# Patient Record
Sex: Female | Born: 1940 | Race: Black or African American | Hispanic: No | Smoking: Never smoker
Health system: Southern US, Community
[De-identification: ages and names within clinical notes are randomized; demographics above are authoritative.]

## PROBLEM LIST (undated history)

## (undated) DIAGNOSIS — D649 Anemia, unspecified: Secondary | ICD-10-CM

## (undated) DIAGNOSIS — F329 Major depressive disorder, single episode, unspecified: Secondary | ICD-10-CM

## (undated) DIAGNOSIS — G40909 Epilepsy, unspecified, not intractable, without status epilepticus: Secondary | ICD-10-CM

## (undated) DIAGNOSIS — F319 Bipolar disorder, unspecified: Secondary | ICD-10-CM

## (undated) DIAGNOSIS — R55 Syncope and collapse: Secondary | ICD-10-CM

## (undated) DIAGNOSIS — D696 Thrombocytopenia, unspecified: Secondary | ICD-10-CM

## (undated) DIAGNOSIS — N183 Chronic kidney disease, stage 3 unspecified: Secondary | ICD-10-CM

## (undated) DIAGNOSIS — Z0389 Encounter for observation for other suspected diseases and conditions ruled out: Secondary | ICD-10-CM

## (undated) DIAGNOSIS — F32A Depression, unspecified: Secondary | ICD-10-CM

## (undated) DIAGNOSIS — I10 Essential (primary) hypertension: Secondary | ICD-10-CM

## (undated) DIAGNOSIS — I5032 Chronic diastolic (congestive) heart failure: Secondary | ICD-10-CM

## (undated) HISTORY — DX: Major depressive disorder, single episode, unspecified: F32.9

## (undated) HISTORY — DX: Chronic kidney disease, stage 3 (moderate): N18.3

## (undated) HISTORY — DX: Syncope and collapse: R55

## (undated) HISTORY — DX: Bipolar disorder, unspecified: F31.9

## (undated) HISTORY — DX: Thrombocytopenia, unspecified: D69.6

## (undated) HISTORY — PX: TUBAL LIGATION: SHX77

## (undated) HISTORY — DX: Essential (primary) hypertension: I10

## (undated) HISTORY — DX: Epilepsy, unspecified, not intractable, without status epilepticus: G40.909

## (undated) HISTORY — DX: Chronic kidney disease, stage 3 unspecified: N18.30

## (undated) HISTORY — DX: Anemia, unspecified: D64.9

## (undated) HISTORY — DX: Chronic diastolic (congestive) heart failure: I50.32

## (undated) HISTORY — PX: APPENDECTOMY: SHX54

## (undated) HISTORY — DX: Depression, unspecified: F32.A

---

## 1997-08-22 ENCOUNTER — Encounter: Admission: RE | Admit: 1997-08-22 | Discharge: 1997-08-22 | Payer: Self-pay | Admitting: Family Medicine

## 1997-08-30 ENCOUNTER — Encounter: Admission: RE | Admit: 1997-08-30 | Discharge: 1997-08-30 | Payer: Self-pay | Admitting: Family Medicine

## 1997-09-13 ENCOUNTER — Encounter: Admission: RE | Admit: 1997-09-13 | Discharge: 1997-09-13 | Payer: Self-pay | Admitting: Family Medicine

## 1997-09-19 ENCOUNTER — Encounter: Admission: RE | Admit: 1997-09-19 | Discharge: 1997-09-19 | Payer: Self-pay | Admitting: Family Medicine

## 1997-12-09 ENCOUNTER — Encounter: Admission: RE | Admit: 1997-12-09 | Discharge: 1997-12-09 | Payer: Self-pay | Admitting: Family Medicine

## 1997-12-21 ENCOUNTER — Other Ambulatory Visit: Admission: RE | Admit: 1997-12-21 | Discharge: 1997-12-21 | Payer: Self-pay | Admitting: *Deleted

## 1998-01-09 ENCOUNTER — Encounter: Admission: RE | Admit: 1998-01-09 | Discharge: 1998-01-09 | Payer: Self-pay | Admitting: Family Medicine

## 1998-01-31 ENCOUNTER — Encounter: Admission: RE | Admit: 1998-01-31 | Discharge: 1998-01-31 | Payer: Self-pay | Admitting: Sports Medicine

## 1998-02-17 ENCOUNTER — Encounter: Admission: RE | Admit: 1998-02-17 | Discharge: 1998-02-17 | Payer: Self-pay | Admitting: Family Medicine

## 1998-03-15 ENCOUNTER — Encounter: Admission: RE | Admit: 1998-03-15 | Discharge: 1998-03-15 | Payer: Self-pay | Admitting: Family Medicine

## 1998-04-19 ENCOUNTER — Emergency Department (HOSPITAL_COMMUNITY): Admission: EM | Admit: 1998-04-19 | Discharge: 1998-04-19 | Payer: Self-pay | Admitting: Emergency Medicine

## 1998-05-26 ENCOUNTER — Encounter: Admission: RE | Admit: 1998-05-26 | Discharge: 1998-05-26 | Payer: Self-pay | Admitting: Family Medicine

## 1998-06-07 ENCOUNTER — Emergency Department (HOSPITAL_COMMUNITY): Admission: EM | Admit: 1998-06-07 | Discharge: 1998-06-07 | Payer: Self-pay | Admitting: Emergency Medicine

## 1998-06-30 ENCOUNTER — Encounter: Admission: RE | Admit: 1998-06-30 | Discharge: 1998-06-30 | Payer: Self-pay | Admitting: Family Medicine

## 1998-07-17 ENCOUNTER — Encounter: Admission: RE | Admit: 1998-07-17 | Discharge: 1998-07-17 | Payer: Self-pay | Admitting: Family Medicine

## 1998-07-31 ENCOUNTER — Encounter: Admission: RE | Admit: 1998-07-31 | Discharge: 1998-07-31 | Payer: Self-pay | Admitting: Sports Medicine

## 1998-08-11 ENCOUNTER — Encounter: Admission: RE | Admit: 1998-08-11 | Discharge: 1998-08-11 | Payer: Self-pay | Admitting: Family Medicine

## 1998-11-02 ENCOUNTER — Encounter: Admission: RE | Admit: 1998-11-02 | Discharge: 1998-11-02 | Payer: Self-pay | Admitting: Family Medicine

## 1998-11-06 ENCOUNTER — Encounter: Admission: RE | Admit: 1998-11-06 | Discharge: 1998-11-06 | Payer: Self-pay | Admitting: Family Medicine

## 1998-11-27 ENCOUNTER — Encounter: Admission: RE | Admit: 1998-11-27 | Discharge: 1998-11-27 | Payer: Self-pay | Admitting: Family Medicine

## 1998-12-31 ENCOUNTER — Emergency Department (HOSPITAL_COMMUNITY): Admission: EM | Admit: 1998-12-31 | Discharge: 1998-12-31 | Payer: Self-pay | Admitting: *Deleted

## 1998-12-31 ENCOUNTER — Encounter: Payer: Self-pay | Admitting: *Deleted

## 1999-01-04 ENCOUNTER — Encounter: Admission: RE | Admit: 1999-01-04 | Discharge: 1999-01-04 | Payer: Self-pay | Admitting: Family Medicine

## 1999-01-19 ENCOUNTER — Encounter: Admission: RE | Admit: 1999-01-19 | Discharge: 1999-01-19 | Payer: Self-pay | Admitting: Family Medicine

## 1999-01-24 ENCOUNTER — Encounter: Admission: RE | Admit: 1999-01-24 | Discharge: 1999-01-24 | Payer: Self-pay | Admitting: Family Medicine

## 1999-05-09 ENCOUNTER — Encounter: Admission: RE | Admit: 1999-05-09 | Discharge: 1999-05-09 | Payer: Self-pay | Admitting: Family Medicine

## 1999-05-17 ENCOUNTER — Encounter: Admission: RE | Admit: 1999-05-17 | Discharge: 1999-06-05 | Payer: Self-pay | Admitting: *Deleted

## 1999-06-13 ENCOUNTER — Encounter: Admission: RE | Admit: 1999-06-13 | Discharge: 1999-06-13 | Payer: Self-pay | Admitting: Family Medicine

## 1999-08-22 ENCOUNTER — Emergency Department (HOSPITAL_COMMUNITY): Admission: EM | Admit: 1999-08-22 | Discharge: 1999-08-22 | Payer: Self-pay | Admitting: *Deleted

## 1999-08-27 ENCOUNTER — Encounter: Admission: RE | Admit: 1999-08-27 | Discharge: 1999-08-27 | Payer: Self-pay | Admitting: Family Medicine

## 1999-09-14 ENCOUNTER — Encounter: Admission: RE | Admit: 1999-09-14 | Discharge: 1999-09-14 | Payer: Self-pay | Admitting: Family Medicine

## 1999-10-08 ENCOUNTER — Encounter: Admission: RE | Admit: 1999-10-08 | Discharge: 1999-10-08 | Payer: Self-pay | Admitting: Family Medicine

## 1999-10-23 ENCOUNTER — Encounter: Admission: RE | Admit: 1999-10-23 | Discharge: 1999-10-23 | Payer: Self-pay | Admitting: Family Medicine

## 1999-11-23 ENCOUNTER — Encounter: Admission: RE | Admit: 1999-11-23 | Discharge: 1999-11-23 | Payer: Self-pay | Admitting: Family Medicine

## 1999-12-11 ENCOUNTER — Encounter: Admission: RE | Admit: 1999-12-11 | Discharge: 1999-12-11 | Payer: Self-pay | Admitting: Sports Medicine

## 2000-04-02 ENCOUNTER — Encounter: Admission: RE | Admit: 2000-04-02 | Discharge: 2000-04-02 | Payer: Self-pay | Admitting: Family Medicine

## 2000-04-02 ENCOUNTER — Ambulatory Visit (HOSPITAL_COMMUNITY): Admission: RE | Admit: 2000-04-02 | Discharge: 2000-04-02 | Payer: Self-pay | Admitting: Family Medicine

## 2000-06-02 ENCOUNTER — Encounter: Admission: RE | Admit: 2000-06-02 | Discharge: 2000-06-02 | Payer: Self-pay | Admitting: Family Medicine

## 2000-07-01 ENCOUNTER — Encounter: Payer: Self-pay | Admitting: Emergency Medicine

## 2000-07-01 ENCOUNTER — Emergency Department (HOSPITAL_COMMUNITY): Admission: EM | Admit: 2000-07-01 | Discharge: 2000-07-01 | Payer: Self-pay | Admitting: Emergency Medicine

## 2000-07-15 ENCOUNTER — Encounter: Admission: RE | Admit: 2000-07-15 | Discharge: 2000-07-15 | Payer: Self-pay | Admitting: Sports Medicine

## 2000-07-29 ENCOUNTER — Encounter: Admission: RE | Admit: 2000-07-29 | Discharge: 2000-07-29 | Payer: Self-pay | Admitting: Sports Medicine

## 2000-08-22 ENCOUNTER — Encounter: Admission: RE | Admit: 2000-08-22 | Discharge: 2000-08-22 | Payer: Self-pay | Admitting: Family Medicine

## 2000-09-15 ENCOUNTER — Inpatient Hospital Stay (HOSPITAL_COMMUNITY): Admission: EM | Admit: 2000-09-15 | Discharge: 2000-09-26 | Payer: Self-pay | Admitting: Psychiatry

## 2000-10-06 ENCOUNTER — Encounter: Admission: RE | Admit: 2000-10-06 | Discharge: 2000-10-06 | Payer: Self-pay | Admitting: Family Medicine

## 2000-11-06 ENCOUNTER — Encounter: Admission: RE | Admit: 2000-11-06 | Discharge: 2000-11-06 | Payer: Self-pay | Admitting: Family Medicine

## 2000-11-17 ENCOUNTER — Encounter: Admission: RE | Admit: 2000-11-17 | Discharge: 2000-11-17 | Payer: Self-pay | Admitting: Family Medicine

## 2000-11-24 ENCOUNTER — Encounter: Admission: RE | Admit: 2000-11-24 | Discharge: 2000-11-24 | Payer: Self-pay | Admitting: Family Medicine

## 2000-12-17 ENCOUNTER — Encounter: Admission: RE | Admit: 2000-12-17 | Discharge: 2000-12-17 | Payer: Self-pay | Admitting: Family Medicine

## 2001-01-16 ENCOUNTER — Encounter: Admission: RE | Admit: 2001-01-16 | Discharge: 2001-01-16 | Payer: Self-pay | Admitting: Family Medicine

## 2001-02-23 ENCOUNTER — Encounter: Admission: RE | Admit: 2001-02-23 | Discharge: 2001-02-23 | Payer: Self-pay | Admitting: Family Medicine

## 2001-05-19 ENCOUNTER — Encounter: Admission: RE | Admit: 2001-05-19 | Discharge: 2001-05-19 | Payer: Self-pay | Admitting: Family Medicine

## 2001-05-29 ENCOUNTER — Encounter: Admission: RE | Admit: 2001-05-29 | Discharge: 2001-05-29 | Payer: Self-pay | Admitting: Family Medicine

## 2001-08-24 ENCOUNTER — Encounter: Admission: RE | Admit: 2001-08-24 | Discharge: 2001-08-24 | Payer: Self-pay | Admitting: Sports Medicine

## 2001-08-24 ENCOUNTER — Encounter (INDEPENDENT_AMBULATORY_CARE_PROVIDER_SITE_OTHER): Payer: Self-pay | Admitting: *Deleted

## 2001-09-28 ENCOUNTER — Encounter: Admission: RE | Admit: 2001-09-28 | Discharge: 2001-09-28 | Payer: Self-pay | Admitting: Family Medicine

## 2001-10-15 ENCOUNTER — Encounter: Admission: RE | Admit: 2001-10-15 | Discharge: 2001-10-15 | Payer: Self-pay | Admitting: Family Medicine

## 2001-11-05 ENCOUNTER — Encounter: Admission: RE | Admit: 2001-11-05 | Discharge: 2001-11-05 | Payer: Self-pay | Admitting: Family Medicine

## 2002-01-23 ENCOUNTER — Emergency Department (HOSPITAL_COMMUNITY): Admission: EM | Admit: 2002-01-23 | Discharge: 2002-01-23 | Payer: Self-pay | Admitting: Emergency Medicine

## 2002-05-07 ENCOUNTER — Encounter: Admission: RE | Admit: 2002-05-07 | Discharge: 2002-05-07 | Payer: Self-pay | Admitting: Family Medicine

## 2002-07-13 ENCOUNTER — Emergency Department (HOSPITAL_COMMUNITY): Admission: EM | Admit: 2002-07-13 | Discharge: 2002-07-14 | Payer: Self-pay | Admitting: Emergency Medicine

## 2002-07-16 ENCOUNTER — Emergency Department (HOSPITAL_COMMUNITY): Admission: EM | Admit: 2002-07-16 | Discharge: 2002-07-16 | Payer: Self-pay | Admitting: Emergency Medicine

## 2002-07-17 ENCOUNTER — Inpatient Hospital Stay (HOSPITAL_COMMUNITY): Admission: EM | Admit: 2002-07-17 | Discharge: 2002-07-30 | Payer: Self-pay | Admitting: Psychiatry

## 2002-11-22 ENCOUNTER — Encounter: Admission: RE | Admit: 2002-11-22 | Discharge: 2002-11-22 | Payer: Self-pay | Admitting: Sports Medicine

## 2002-11-24 ENCOUNTER — Encounter: Admission: RE | Admit: 2002-11-24 | Discharge: 2002-11-24 | Payer: Self-pay | Admitting: Sports Medicine

## 2002-11-24 ENCOUNTER — Encounter: Payer: Self-pay | Admitting: Sports Medicine

## 2003-01-13 ENCOUNTER — Encounter: Admission: RE | Admit: 2003-01-13 | Discharge: 2003-01-13 | Payer: Self-pay | Admitting: Sports Medicine

## 2003-03-14 ENCOUNTER — Encounter: Admission: RE | Admit: 2003-03-14 | Discharge: 2003-03-14 | Payer: Self-pay | Admitting: Family Medicine

## 2003-04-15 ENCOUNTER — Emergency Department (HOSPITAL_COMMUNITY): Admission: EM | Admit: 2003-04-15 | Discharge: 2003-04-16 | Payer: Self-pay | Admitting: Emergency Medicine

## 2003-04-16 HISTORY — PX: CARDIAC CATHETERIZATION: SHX172

## 2003-06-07 ENCOUNTER — Encounter: Admission: RE | Admit: 2003-06-07 | Discharge: 2003-06-07 | Payer: Self-pay | Admitting: Family Medicine

## 2003-10-31 ENCOUNTER — Encounter: Admission: RE | Admit: 2003-10-31 | Discharge: 2003-10-31 | Payer: Self-pay | Admitting: Family Medicine

## 2003-12-23 ENCOUNTER — Ambulatory Visit: Payer: Self-pay | Admitting: Family Medicine

## 2004-01-19 ENCOUNTER — Ambulatory Visit: Payer: Self-pay | Admitting: Family Medicine

## 2004-02-22 ENCOUNTER — Ambulatory Visit: Payer: Self-pay | Admitting: Family Medicine

## 2004-02-28 ENCOUNTER — Ambulatory Visit: Payer: Self-pay | Admitting: Family Medicine

## 2004-03-05 ENCOUNTER — Ambulatory Visit: Payer: Self-pay | Admitting: Family Medicine

## 2004-03-19 ENCOUNTER — Ambulatory Visit (HOSPITAL_COMMUNITY): Admission: RE | Admit: 2004-03-19 | Discharge: 2004-03-19 | Payer: Self-pay | Admitting: Cardiology

## 2004-04-02 ENCOUNTER — Ambulatory Visit: Payer: Self-pay | Admitting: Family Medicine

## 2004-04-15 ENCOUNTER — Encounter (INDEPENDENT_AMBULATORY_CARE_PROVIDER_SITE_OTHER): Payer: Self-pay | Admitting: *Deleted

## 2004-04-24 ENCOUNTER — Ambulatory Visit: Payer: Self-pay | Admitting: Family Medicine

## 2004-05-04 ENCOUNTER — Ambulatory Visit: Payer: Self-pay | Admitting: Sports Medicine

## 2004-05-11 ENCOUNTER — Ambulatory Visit: Payer: Self-pay | Admitting: Family Medicine

## 2004-05-28 ENCOUNTER — Ambulatory Visit: Payer: Self-pay | Admitting: Family Medicine

## 2004-05-28 ENCOUNTER — Encounter: Admission: RE | Admit: 2004-05-28 | Discharge: 2004-05-28 | Payer: Self-pay | Admitting: Sports Medicine

## 2004-07-13 ENCOUNTER — Ambulatory Visit: Payer: Self-pay | Admitting: Sports Medicine

## 2004-08-28 ENCOUNTER — Ambulatory Visit: Payer: Self-pay | Admitting: Family Medicine

## 2004-12-25 ENCOUNTER — Ambulatory Visit: Payer: Self-pay | Admitting: Family Medicine

## 2004-12-28 ENCOUNTER — Ambulatory Visit: Payer: Self-pay | Admitting: Family Medicine

## 2005-01-22 ENCOUNTER — Ambulatory Visit: Payer: Self-pay | Admitting: Sports Medicine

## 2005-02-05 ENCOUNTER — Ambulatory Visit: Payer: Self-pay | Admitting: Family Medicine

## 2005-03-21 ENCOUNTER — Ambulatory Visit: Payer: Self-pay | Admitting: Family Medicine

## 2005-05-02 ENCOUNTER — Ambulatory Visit: Payer: Self-pay | Admitting: Family Medicine

## 2005-05-21 ENCOUNTER — Emergency Department (HOSPITAL_COMMUNITY): Admission: EM | Admit: 2005-05-21 | Discharge: 2005-05-21 | Payer: Self-pay | Admitting: Emergency Medicine

## 2005-06-12 ENCOUNTER — Ambulatory Visit: Payer: Self-pay | Admitting: Family Medicine

## 2005-07-04 ENCOUNTER — Ambulatory Visit: Payer: Self-pay | Admitting: Family Medicine

## 2005-08-01 ENCOUNTER — Ambulatory Visit: Payer: Self-pay | Admitting: Family Medicine

## 2005-08-21 ENCOUNTER — Ambulatory Visit: Payer: Self-pay | Admitting: Family Medicine

## 2005-09-03 ENCOUNTER — Ambulatory Visit: Payer: Self-pay | Admitting: Sports Medicine

## 2005-10-03 ENCOUNTER — Ambulatory Visit: Payer: Self-pay | Admitting: Sports Medicine

## 2006-01-22 ENCOUNTER — Ambulatory Visit: Payer: Self-pay | Admitting: Family Medicine

## 2006-03-11 ENCOUNTER — Ambulatory Visit: Payer: Self-pay | Admitting: Family Medicine

## 2006-04-10 ENCOUNTER — Ambulatory Visit: Payer: Self-pay | Admitting: Family Medicine

## 2006-04-27 ENCOUNTER — Emergency Department (HOSPITAL_COMMUNITY): Admission: EM | Admit: 2006-04-27 | Discharge: 2006-04-28 | Payer: Self-pay | Admitting: Emergency Medicine

## 2006-06-10 ENCOUNTER — Ambulatory Visit: Payer: Self-pay | Admitting: Family Medicine

## 2006-06-12 DIAGNOSIS — E7800 Pure hypercholesterolemia, unspecified: Secondary | ICD-10-CM | POA: Insufficient documentation

## 2006-06-12 DIAGNOSIS — F319 Bipolar disorder, unspecified: Secondary | ICD-10-CM | POA: Insufficient documentation

## 2006-06-12 DIAGNOSIS — R569 Unspecified convulsions: Secondary | ICD-10-CM | POA: Insufficient documentation

## 2006-06-12 DIAGNOSIS — I10 Essential (primary) hypertension: Secondary | ICD-10-CM

## 2006-06-13 ENCOUNTER — Encounter (INDEPENDENT_AMBULATORY_CARE_PROVIDER_SITE_OTHER): Payer: Self-pay | Admitting: *Deleted

## 2006-07-14 ENCOUNTER — Telehealth: Payer: Self-pay | Admitting: *Deleted

## 2006-07-15 ENCOUNTER — Encounter: Payer: Self-pay | Admitting: Family Medicine

## 2006-07-15 ENCOUNTER — Telehealth (INDEPENDENT_AMBULATORY_CARE_PROVIDER_SITE_OTHER): Payer: Self-pay | Admitting: *Deleted

## 2006-07-15 ENCOUNTER — Ambulatory Visit: Payer: Self-pay | Admitting: Sports Medicine

## 2006-07-18 ENCOUNTER — Encounter: Payer: Self-pay | Admitting: *Deleted

## 2006-07-19 ENCOUNTER — Emergency Department (HOSPITAL_COMMUNITY): Admission: EM | Admit: 2006-07-19 | Discharge: 2006-07-19 | Payer: Self-pay | Admitting: Emergency Medicine

## 2006-07-21 ENCOUNTER — Ambulatory Visit: Payer: Self-pay | Admitting: Family Medicine

## 2006-08-07 ENCOUNTER — Telehealth: Payer: Self-pay | Admitting: *Deleted

## 2006-08-07 ENCOUNTER — Ambulatory Visit: Payer: Self-pay | Admitting: Sports Medicine

## 2006-08-08 ENCOUNTER — Encounter: Admission: RE | Admit: 2006-08-08 | Discharge: 2006-08-08 | Payer: Self-pay | Admitting: Sports Medicine

## 2006-08-11 ENCOUNTER — Telehealth (INDEPENDENT_AMBULATORY_CARE_PROVIDER_SITE_OTHER): Payer: Self-pay | Admitting: *Deleted

## 2006-09-03 ENCOUNTER — Encounter (INDEPENDENT_AMBULATORY_CARE_PROVIDER_SITE_OTHER): Payer: Self-pay | Admitting: Family Medicine

## 2006-09-03 ENCOUNTER — Ambulatory Visit: Payer: Self-pay | Admitting: Family Medicine

## 2006-11-04 ENCOUNTER — Telehealth: Payer: Self-pay | Admitting: *Deleted

## 2006-11-06 ENCOUNTER — Ambulatory Visit: Payer: Self-pay | Admitting: Family Medicine

## 2006-11-10 ENCOUNTER — Encounter (INDEPENDENT_AMBULATORY_CARE_PROVIDER_SITE_OTHER): Payer: Self-pay | Admitting: Family Medicine

## 2006-11-10 ENCOUNTER — Ambulatory Visit: Payer: Self-pay | Admitting: Family Medicine

## 2006-11-10 LAB — CONVERTED CEMR LAB
CO2: 24 meq/L (ref 19–32)
Creatinine, Ser: 1.06 mg/dL (ref 0.40–1.20)
Glucose, Bld: 85 mg/dL (ref 70–99)
HCT: 37.9 % (ref 36.0–46.0)
MCHC: 32.5 g/dL (ref 30.0–36.0)
MCV: 84.2 fL (ref 78.0–100.0)
Pro B Natriuretic peptide (BNP): 15 pg/mL (ref 0.0–100.0)
RBC: 4.5 M/uL (ref 3.87–5.11)
TSH: 2.817 microintl units/mL (ref 0.350–5.50)
Total Bilirubin: 0.3 mg/dL (ref 0.3–1.2)
WBC: 3.8 10*3/uL — ABNORMAL LOW (ref 4.0–10.5)

## 2006-11-21 ENCOUNTER — Emergency Department (HOSPITAL_COMMUNITY): Admission: EM | Admit: 2006-11-21 | Discharge: 2006-11-21 | Payer: Self-pay | Admitting: Emergency Medicine

## 2006-11-26 ENCOUNTER — Ambulatory Visit: Payer: Self-pay | Admitting: Family Medicine

## 2006-11-26 LAB — CONVERTED CEMR LAB
Cholesterol, target level: 200 mg/dL
HDL goal, serum: 40 mg/dL
LDL Goal: 130 mg/dL

## 2006-12-04 ENCOUNTER — Encounter (INDEPENDENT_AMBULATORY_CARE_PROVIDER_SITE_OTHER): Payer: Self-pay | Admitting: Family Medicine

## 2006-12-10 ENCOUNTER — Ambulatory Visit: Payer: Self-pay | Admitting: Family Medicine

## 2006-12-10 ENCOUNTER — Encounter (INDEPENDENT_AMBULATORY_CARE_PROVIDER_SITE_OTHER): Payer: Self-pay | Admitting: Family Medicine

## 2006-12-10 DIAGNOSIS — E663 Overweight: Secondary | ICD-10-CM | POA: Insufficient documentation

## 2007-01-26 ENCOUNTER — Telehealth: Payer: Self-pay | Admitting: *Deleted

## 2007-01-27 ENCOUNTER — Ambulatory Visit: Payer: Self-pay | Admitting: Family Medicine

## 2007-02-04 ENCOUNTER — Telehealth (INDEPENDENT_AMBULATORY_CARE_PROVIDER_SITE_OTHER): Payer: Self-pay | Admitting: *Deleted

## 2007-02-04 ENCOUNTER — Ambulatory Visit: Payer: Self-pay | Admitting: Family Medicine

## 2007-02-10 ENCOUNTER — Emergency Department (HOSPITAL_COMMUNITY): Admission: EM | Admit: 2007-02-10 | Discharge: 2007-02-11 | Payer: Self-pay | Admitting: *Deleted

## 2007-02-16 ENCOUNTER — Telehealth (INDEPENDENT_AMBULATORY_CARE_PROVIDER_SITE_OTHER): Payer: Self-pay | Admitting: Family Medicine

## 2007-02-17 ENCOUNTER — Ambulatory Visit: Payer: Self-pay | Admitting: Family Medicine

## 2007-02-23 ENCOUNTER — Encounter: Admission: RE | Admit: 2007-02-23 | Discharge: 2007-02-23 | Payer: Self-pay | Admitting: Sports Medicine

## 2007-02-27 ENCOUNTER — Telehealth (INDEPENDENT_AMBULATORY_CARE_PROVIDER_SITE_OTHER): Payer: Self-pay | Admitting: Family Medicine

## 2007-05-18 ENCOUNTER — Telehealth: Payer: Self-pay | Admitting: *Deleted

## 2007-06-01 ENCOUNTER — Ambulatory Visit: Payer: Self-pay | Admitting: Family Medicine

## 2007-06-01 DIAGNOSIS — M545 Low back pain, unspecified: Secondary | ICD-10-CM | POA: Insufficient documentation

## 2007-06-02 ENCOUNTER — Telehealth: Payer: Self-pay | Admitting: *Deleted

## 2007-06-29 ENCOUNTER — Telehealth: Payer: Self-pay | Admitting: *Deleted

## 2007-06-29 ENCOUNTER — Encounter: Admission: RE | Admit: 2007-06-29 | Discharge: 2007-08-03 | Payer: Self-pay | Admitting: Family Medicine

## 2007-06-30 ENCOUNTER — Telehealth (INDEPENDENT_AMBULATORY_CARE_PROVIDER_SITE_OTHER): Payer: Self-pay | Admitting: Family Medicine

## 2007-07-03 ENCOUNTER — Encounter (INDEPENDENT_AMBULATORY_CARE_PROVIDER_SITE_OTHER): Payer: Self-pay | Admitting: Family Medicine

## 2007-07-11 ENCOUNTER — Ambulatory Visit: Payer: Self-pay | Admitting: *Deleted

## 2007-07-12 ENCOUNTER — Inpatient Hospital Stay (HOSPITAL_COMMUNITY): Admission: EM | Admit: 2007-07-12 | Discharge: 2007-07-13 | Payer: Self-pay | Admitting: Emergency Medicine

## 2007-07-20 ENCOUNTER — Ambulatory Visit: Payer: Self-pay | Admitting: Family Medicine

## 2007-07-20 ENCOUNTER — Telehealth (INDEPENDENT_AMBULATORY_CARE_PROVIDER_SITE_OTHER): Payer: Self-pay | Admitting: Family Medicine

## 2007-07-21 ENCOUNTER — Telehealth (INDEPENDENT_AMBULATORY_CARE_PROVIDER_SITE_OTHER): Payer: Self-pay | Admitting: *Deleted

## 2007-08-26 ENCOUNTER — Encounter (INDEPENDENT_AMBULATORY_CARE_PROVIDER_SITE_OTHER): Payer: Self-pay | Admitting: Family Medicine

## 2007-08-26 ENCOUNTER — Ambulatory Visit: Payer: Self-pay | Admitting: Family Medicine

## 2007-08-27 ENCOUNTER — Telehealth (INDEPENDENT_AMBULATORY_CARE_PROVIDER_SITE_OTHER): Payer: Self-pay | Admitting: Family Medicine

## 2007-08-27 ENCOUNTER — Encounter (INDEPENDENT_AMBULATORY_CARE_PROVIDER_SITE_OTHER): Payer: Self-pay | Admitting: Family Medicine

## 2007-08-27 DIAGNOSIS — N183 Chronic kidney disease, stage 3 (moderate): Secondary | ICD-10-CM

## 2007-08-27 LAB — CONVERTED CEMR LAB
BUN: 22 mg/dL (ref 6–23)
Calcium: 9.8 mg/dL (ref 8.4–10.5)
Creatinine, Ser: 1.72 mg/dL — ABNORMAL HIGH (ref 0.40–1.20)
Glucose, Bld: 116 mg/dL — ABNORMAL HIGH (ref 70–99)
Potassium: 4.1 meq/L (ref 3.5–5.3)
Valproic Acid Lvl: 98.6 ug/mL (ref 50.0–100.0)

## 2007-08-29 ENCOUNTER — Emergency Department (HOSPITAL_COMMUNITY): Admission: EM | Admit: 2007-08-29 | Discharge: 2007-08-29 | Payer: Self-pay | Admitting: Emergency Medicine

## 2007-08-31 ENCOUNTER — Telehealth: Payer: Self-pay | Admitting: *Deleted

## 2007-09-01 ENCOUNTER — Ambulatory Visit: Payer: Self-pay | Admitting: Family Medicine

## 2007-09-01 ENCOUNTER — Encounter (INDEPENDENT_AMBULATORY_CARE_PROVIDER_SITE_OTHER): Payer: Self-pay | Admitting: Family Medicine

## 2007-09-02 ENCOUNTER — Telehealth (INDEPENDENT_AMBULATORY_CARE_PROVIDER_SITE_OTHER): Payer: Self-pay | Admitting: Family Medicine

## 2007-09-04 ENCOUNTER — Encounter (INDEPENDENT_AMBULATORY_CARE_PROVIDER_SITE_OTHER): Payer: Self-pay | Admitting: Family Medicine

## 2007-09-04 ENCOUNTER — Ambulatory Visit: Payer: Self-pay | Admitting: Family Medicine

## 2007-09-04 ENCOUNTER — Ambulatory Visit: Payer: Self-pay | Admitting: Surgery

## 2007-09-04 ENCOUNTER — Ambulatory Visit (HOSPITAL_COMMUNITY): Admission: RE | Admit: 2007-09-04 | Discharge: 2007-09-04 | Payer: Self-pay | Admitting: Family Medicine

## 2007-09-04 ENCOUNTER — Encounter: Payer: Self-pay | Admitting: Family Medicine

## 2007-09-08 ENCOUNTER — Telehealth (INDEPENDENT_AMBULATORY_CARE_PROVIDER_SITE_OTHER): Payer: Self-pay | Admitting: Family Medicine

## 2007-09-11 ENCOUNTER — Ambulatory Visit: Payer: Self-pay | Admitting: Family Medicine

## 2007-09-11 ENCOUNTER — Encounter (INDEPENDENT_AMBULATORY_CARE_PROVIDER_SITE_OTHER): Payer: Self-pay | Admitting: Family Medicine

## 2007-09-11 LAB — CONVERTED CEMR LAB
Ketones, urine, test strip: NEGATIVE
Nitrite: NEGATIVE
Urobilinogen, UA: 0.2

## 2007-09-14 ENCOUNTER — Telehealth (INDEPENDENT_AMBULATORY_CARE_PROVIDER_SITE_OTHER): Payer: Self-pay | Admitting: Family Medicine

## 2007-09-14 LAB — CONVERTED CEMR LAB
BUN: 17 mg/dL (ref 6–23)
Chloride: 103 meq/L (ref 96–112)
Pap Smear: NORMAL
Potassium: 4.2 meq/L (ref 3.5–5.3)

## 2007-10-07 ENCOUNTER — Encounter (INDEPENDENT_AMBULATORY_CARE_PROVIDER_SITE_OTHER): Payer: Self-pay | Admitting: Family Medicine

## 2007-10-27 ENCOUNTER — Telehealth: Payer: Self-pay | Admitting: *Deleted

## 2007-11-02 ENCOUNTER — Encounter (INDEPENDENT_AMBULATORY_CARE_PROVIDER_SITE_OTHER): Payer: Self-pay | Admitting: Family Medicine

## 2008-03-14 ENCOUNTER — Telehealth: Payer: Self-pay | Admitting: *Deleted

## 2008-03-17 LAB — CONVERTED CEMR LAB
HDL: 65 mg/dL
LDL Cholesterol: 66 mg/dL

## 2008-03-18 ENCOUNTER — Telehealth: Payer: Self-pay | Admitting: *Deleted

## 2008-03-24 ENCOUNTER — Ambulatory Visit: Payer: Self-pay | Admitting: Family Medicine

## 2008-03-24 ENCOUNTER — Telehealth: Payer: Self-pay | Admitting: *Deleted

## 2008-03-25 ENCOUNTER — Encounter (INDEPENDENT_AMBULATORY_CARE_PROVIDER_SITE_OTHER): Payer: Self-pay | Admitting: Family Medicine

## 2008-03-25 LAB — CONVERTED CEMR LAB
ALT: 15 units/L (ref 0–35)
AST: 20 units/L (ref 0–37)
Albumin: 4.8 g/dL (ref 3.5–5.2)
Alkaline Phosphatase: 56 units/L (ref 39–117)
INR: 1 (ref 0.0–1.5)
MCV: 79.6 fL (ref 78.0–100.0)
Potassium: 3.9 meq/L (ref 3.5–5.3)
Prothrombin Time: 13.2 s (ref 11.6–15.2)
RBC: 4.66 M/uL (ref 3.87–5.11)
Sodium: 141 meq/L (ref 135–145)
Total Protein: 7.8 g/dL (ref 6.0–8.3)
WBC: 5 10*3/uL (ref 4.0–10.5)

## 2008-04-01 ENCOUNTER — Telehealth: Payer: Self-pay | Admitting: *Deleted

## 2008-04-25 ENCOUNTER — Ambulatory Visit: Payer: Self-pay | Admitting: Family Medicine

## 2008-04-26 ENCOUNTER — Telehealth (INDEPENDENT_AMBULATORY_CARE_PROVIDER_SITE_OTHER): Payer: Self-pay | Admitting: *Deleted

## 2008-05-02 ENCOUNTER — Emergency Department (HOSPITAL_COMMUNITY): Admission: EM | Admit: 2008-05-02 | Discharge: 2008-05-02 | Payer: Self-pay | Admitting: Emergency Medicine

## 2008-05-03 ENCOUNTER — Ambulatory Visit: Payer: Self-pay | Admitting: Family Medicine

## 2008-05-03 ENCOUNTER — Telehealth (INDEPENDENT_AMBULATORY_CARE_PROVIDER_SITE_OTHER): Payer: Self-pay | Admitting: Family Medicine

## 2008-05-08 ENCOUNTER — Telehealth: Payer: Self-pay | Admitting: Family Medicine

## 2008-05-09 ENCOUNTER — Encounter (INDEPENDENT_AMBULATORY_CARE_PROVIDER_SITE_OTHER): Payer: Self-pay | Admitting: Family Medicine

## 2008-05-09 ENCOUNTER — Ambulatory Visit: Payer: Self-pay | Admitting: Family Medicine

## 2008-05-09 ENCOUNTER — Ambulatory Visit (HOSPITAL_COMMUNITY): Admission: RE | Admit: 2008-05-09 | Discharge: 2008-05-09 | Payer: Self-pay | Admitting: Family Medicine

## 2008-05-25 ENCOUNTER — Ambulatory Visit: Payer: Self-pay | Admitting: Family Medicine

## 2008-05-25 DIAGNOSIS — M109 Gout, unspecified: Secondary | ICD-10-CM

## 2008-05-28 ENCOUNTER — Emergency Department (HOSPITAL_COMMUNITY): Admission: EM | Admit: 2008-05-28 | Discharge: 2008-05-28 | Payer: Self-pay | Admitting: Emergency Medicine

## 2008-06-22 ENCOUNTER — Ambulatory Visit: Payer: Self-pay | Admitting: Family Medicine

## 2008-07-28 ENCOUNTER — Telehealth (INDEPENDENT_AMBULATORY_CARE_PROVIDER_SITE_OTHER): Payer: Self-pay | Admitting: *Deleted

## 2008-08-23 ENCOUNTER — Encounter (INDEPENDENT_AMBULATORY_CARE_PROVIDER_SITE_OTHER): Payer: Self-pay | Admitting: Family Medicine

## 2008-08-23 ENCOUNTER — Ambulatory Visit: Payer: Self-pay | Admitting: Family Medicine

## 2008-08-24 ENCOUNTER — Encounter (INDEPENDENT_AMBULATORY_CARE_PROVIDER_SITE_OTHER): Payer: Self-pay | Admitting: Family Medicine

## 2008-08-24 LAB — CONVERTED CEMR LAB
ALT: 13 units/L (ref 0–35)
AST: 17 units/L (ref 0–37)
CO2: 24 meq/L (ref 19–32)
Creatinine, Ser: 1.03 mg/dL (ref 0.40–1.20)
HCT: 36.8 % (ref 36.0–46.0)
MCV: 83.3 fL (ref 78.0–100.0)
Platelets: 172 10*3/uL (ref 150–400)
RDW: 16.8 % — ABNORMAL HIGH (ref 11.5–15.5)
TSH: 2.518 microintl units/mL (ref 0.350–4.500)
Total Bilirubin: 0.3 mg/dL (ref 0.3–1.2)

## 2008-10-22 ENCOUNTER — Encounter: Payer: Self-pay | Admitting: Family Medicine

## 2008-10-22 ENCOUNTER — Inpatient Hospital Stay (HOSPITAL_COMMUNITY): Admission: EM | Admit: 2008-10-22 | Discharge: 2008-10-22 | Payer: Self-pay | Admitting: Emergency Medicine

## 2008-10-22 ENCOUNTER — Ambulatory Visit: Payer: Self-pay | Admitting: Family Medicine

## 2008-10-22 DIAGNOSIS — R079 Chest pain, unspecified: Secondary | ICD-10-CM

## 2008-10-24 ENCOUNTER — Encounter: Payer: Self-pay | Admitting: *Deleted

## 2008-10-28 ENCOUNTER — Ambulatory Visit: Payer: Self-pay | Admitting: Family Medicine

## 2008-11-01 ENCOUNTER — Encounter: Payer: Self-pay | Admitting: Family Medicine

## 2008-11-07 ENCOUNTER — Ambulatory Visit: Payer: Self-pay | Admitting: Family Medicine

## 2008-11-07 DIAGNOSIS — D649 Anemia, unspecified: Secondary | ICD-10-CM

## 2009-01-04 ENCOUNTER — Telehealth: Payer: Self-pay | Admitting: Family Medicine

## 2009-02-17 ENCOUNTER — Emergency Department (HOSPITAL_COMMUNITY): Admission: EM | Admit: 2009-02-17 | Discharge: 2009-02-17 | Payer: Self-pay | Admitting: Emergency Medicine

## 2009-03-02 ENCOUNTER — Telehealth: Payer: Self-pay | Admitting: *Deleted

## 2009-03-27 ENCOUNTER — Ambulatory Visit: Payer: Self-pay | Admitting: Family Medicine

## 2009-03-27 LAB — CONVERTED CEMR LAB: Hemoglobin: 12.5 g/dL

## 2009-04-02 ENCOUNTER — Emergency Department (HOSPITAL_COMMUNITY): Admission: EM | Admit: 2009-04-02 | Discharge: 2009-04-02 | Payer: Self-pay | Admitting: Emergency Medicine

## 2009-04-20 ENCOUNTER — Telehealth: Payer: Self-pay | Admitting: Family Medicine

## 2009-05-11 ENCOUNTER — Ambulatory Visit: Payer: Self-pay | Admitting: Family Medicine

## 2009-06-10 ENCOUNTER — Telehealth: Payer: Self-pay | Admitting: Family Medicine

## 2009-06-11 ENCOUNTER — Emergency Department (HOSPITAL_COMMUNITY): Admission: EM | Admit: 2009-06-11 | Discharge: 2009-06-11 | Payer: Self-pay | Admitting: Family Medicine

## 2009-06-11 ENCOUNTER — Telehealth: Payer: Self-pay | Admitting: Family Medicine

## 2009-06-13 ENCOUNTER — Ambulatory Visit: Payer: Self-pay | Admitting: Family Medicine

## 2009-06-27 ENCOUNTER — Ambulatory Visit: Payer: Self-pay | Admitting: Family Medicine

## 2009-06-27 ENCOUNTER — Encounter: Payer: Self-pay | Admitting: Family Medicine

## 2009-06-27 LAB — CONVERTED CEMR LAB
ALT: 18 units/L (ref 0–35)
AST: 25 units/L (ref 0–37)
Alkaline Phosphatase: 61 units/L (ref 39–117)
BUN: 18 mg/dL (ref 6–23)
CO2: 23 meq/L (ref 19–32)
Chloride: 101 meq/L (ref 96–112)
Creatinine, Ser: 1.16 mg/dL (ref 0.40–1.20)
Glucose, Bld: 89 mg/dL (ref 70–99)
HDL: 59 mg/dL (ref >39)
LDL Cholesterol: 97 mg/dL (ref 0–99)
MCV: 83.8 fL (ref 78.0–100.0)
RBC: 4.81 M/uL (ref 3.87–5.11)
Sodium: 138 meq/L (ref 135–145)
Total CHOL/HDL Ratio: 3
WBC: 4.1 10*3/uL (ref 4.0–10.5)

## 2009-06-28 ENCOUNTER — Encounter: Payer: Self-pay | Admitting: Family Medicine

## 2009-09-22 ENCOUNTER — Ambulatory Visit: Payer: Self-pay | Admitting: Family Medicine

## 2009-10-22 ENCOUNTER — Emergency Department (HOSPITAL_COMMUNITY): Admission: EM | Admit: 2009-10-22 | Discharge: 2009-10-23 | Payer: Self-pay | Admitting: Emergency Medicine

## 2009-10-24 ENCOUNTER — Telehealth: Payer: Self-pay | Admitting: Family Medicine

## 2009-10-25 ENCOUNTER — Ambulatory Visit: Payer: Self-pay | Admitting: Family Medicine

## 2009-10-25 DIAGNOSIS — M25569 Pain in unspecified knee: Secondary | ICD-10-CM

## 2009-11-06 ENCOUNTER — Emergency Department (HOSPITAL_COMMUNITY): Admission: EM | Admit: 2009-11-06 | Discharge: 2009-11-06 | Payer: Self-pay | Admitting: Emergency Medicine

## 2009-11-07 ENCOUNTER — Telehealth: Payer: Self-pay | Admitting: *Deleted

## 2009-11-10 ENCOUNTER — Ambulatory Visit: Payer: Self-pay | Admitting: Psychiatry

## 2009-11-10 ENCOUNTER — Encounter: Payer: Self-pay | Admitting: Family Medicine

## 2009-11-11 ENCOUNTER — Ambulatory Visit: Payer: Self-pay | Admitting: Family Medicine

## 2009-11-11 ENCOUNTER — Inpatient Hospital Stay (HOSPITAL_COMMUNITY): Admission: EM | Admit: 2009-11-11 | Discharge: 2009-11-13 | Payer: Self-pay | Admitting: Emergency Medicine

## 2009-11-13 ENCOUNTER — Inpatient Hospital Stay (HOSPITAL_COMMUNITY): Admission: AD | Admit: 2009-11-13 | Discharge: 2009-11-27 | Payer: Self-pay | Admitting: Psychiatry

## 2009-11-13 ENCOUNTER — Ambulatory Visit: Payer: Self-pay | Admitting: Psychiatry

## 2009-11-14 ENCOUNTER — Encounter: Payer: Self-pay | Admitting: Family Medicine

## 2009-11-28 ENCOUNTER — Ambulatory Visit: Payer: Self-pay | Admitting: Family Medicine

## 2009-11-28 ENCOUNTER — Telehealth: Payer: Self-pay | Admitting: Family Medicine

## 2009-11-28 DIAGNOSIS — F259 Schizoaffective disorder, unspecified: Secondary | ICD-10-CM | POA: Insufficient documentation

## 2009-12-01 ENCOUNTER — Ambulatory Visit: Payer: Self-pay | Admitting: Family Medicine

## 2009-12-05 ENCOUNTER — Ambulatory Visit: Payer: Self-pay | Admitting: Family Medicine

## 2010-01-01 ENCOUNTER — Encounter: Payer: Self-pay | Admitting: Family Medicine

## 2010-01-01 ENCOUNTER — Telehealth: Payer: Self-pay | Admitting: Family Medicine

## 2010-01-01 ENCOUNTER — Ambulatory Visit: Payer: Self-pay | Admitting: Family Medicine

## 2010-01-01 ENCOUNTER — Ambulatory Visit (HOSPITAL_COMMUNITY): Admission: RE | Admit: 2010-01-01 | Discharge: 2010-01-01 | Payer: Self-pay | Admitting: Family Medicine

## 2010-01-06 ENCOUNTER — Ambulatory Visit: Payer: Self-pay | Admitting: Psychiatry

## 2010-01-08 ENCOUNTER — Telehealth: Payer: Self-pay | Admitting: Family Medicine

## 2010-01-09 ENCOUNTER — Ambulatory Visit: Payer: Self-pay | Admitting: Family Medicine

## 2010-01-09 ENCOUNTER — Encounter: Payer: Self-pay | Admitting: Family Medicine

## 2010-01-11 ENCOUNTER — Encounter: Payer: Self-pay | Admitting: Family Medicine

## 2010-01-11 ENCOUNTER — Ambulatory Visit (HOSPITAL_COMMUNITY): Admission: RE | Admit: 2010-01-11 | Discharge: 2010-01-11 | Payer: Self-pay | Admitting: Family Medicine

## 2010-01-11 ENCOUNTER — Ambulatory Visit: Payer: Self-pay | Admitting: Vascular Surgery

## 2010-01-12 LAB — CONVERTED CEMR LAB
ALT: 45 units/L — ABNORMAL HIGH (ref 0–35)
Albumin: 4.1 g/dL (ref 3.5–5.2)
CO2: 23 meq/L (ref 19–32)
Calcium: 9.6 mg/dL (ref 8.4–10.5)
Chloride: 104 meq/L (ref 96–112)
Platelets: 160 10*3/uL (ref 150–400)
Potassium: 4.2 meq/L (ref 3.5–5.3)
Sodium: 142 meq/L (ref 135–145)
TSH: 3.81 microintl units/mL (ref 0.350–4.500)
Total Protein: 6.7 g/dL (ref 6.0–8.3)
WBC: 5.2 10*3/uL (ref 4.0–10.5)

## 2010-01-15 ENCOUNTER — Encounter: Payer: Self-pay | Admitting: Family Medicine

## 2010-01-15 LAB — CONVERTED CEMR LAB: TIBC: 307 ug/dL (ref 250–470)

## 2010-02-21 ENCOUNTER — Ambulatory Visit: Payer: Self-pay | Admitting: Family Medicine

## 2010-03-07 ENCOUNTER — Encounter: Payer: Self-pay | Admitting: Family Medicine

## 2010-03-07 ENCOUNTER — Encounter
Admission: RE | Admit: 2010-03-07 | Discharge: 2010-04-12 | Payer: Self-pay | Source: Home / Self Care | Attending: Family Medicine | Admitting: Family Medicine

## 2010-04-11 ENCOUNTER — Encounter: Payer: Self-pay | Admitting: Family Medicine

## 2010-05-11 ENCOUNTER — Emergency Department (HOSPITAL_COMMUNITY)
Admission: EM | Admit: 2010-05-11 | Discharge: 2010-05-11 | Payer: Self-pay | Source: Home / Self Care | Admitting: Emergency Medicine

## 2010-05-15 NOTE — Letter (Signed)
Summary: Results Follow-up Letter  [facility]  [address]   [place], [place] [address]   Phone: [phone]  Fax: [phone]    06/28/2009  [address] [place], [place]  [address]  Dear Ms. Mayhall,   The following are the results of your recent test(s):  Your recent blood work shows that  your cholesteral is good with normal blood counts and electrolytes.  Please let me know if you have further questions.   Sincerely,  Delbert Harness MD [facility]           Appended Document: Results Follow-up Letter mailed.

## 2010-05-15 NOTE — Assessment & Plan Note (Signed)
Summary: f/u intertrigo/eo   Vital Signs:  Patient profile:   70 year old female Height:      62.75 inches Weight:      155 pounds BMI:     27.78 Temp:     98.3 degrees F oral Pulse rate:   89 / minute BP sitting:   154 / 66  (left arm) Cuff size:   regular  Vitals Entered By: Jimmy Footman, CMA (December 01, 2009 10:29 AM) CC: f/u on rash under left breast Is Patient Diabetic? No Pain Assessment Patient in pain? no        Primary Care Provider:  Delbert Harness MD  CC:  f/u on rash under left breast.  History of Present Illness: 70 yo here for follow-up of last visit  left breast intertrigo:  since last visit did not use nystatin powder as instructed.  DId not get supoprtive bra.  Instead has been usuing olive oil in the area.  She thinks it is better.  Denies fevers.  f/u behavioral health admission.  Denies si,hi, delusions, hallucinations.  States she is taking all meds as directed.  Habits & Providers  Alcohol-Tobacco-Diet     Tobacco Status: never  Current Medications (verified): 1)  Cardizem Cd 120 Mg Xr24h-Cap (Diltiazem Hcl Coated Beads) .... One Tablet Daily- Please Use in Place of Short Acting Cardizem 2)  Baby Aspirin 81 Mg  Chew (Aspirin) .... One Tablet Daily 3)  Depakote 500 Mg Tbec (Divalproex Sodium) .... Once Tablet Daily and Two Tablets At Bedtime 4)  Clonazepam 0.5 Mg  Tabs (Clonazepam) .[name].. 1 Tablet By Mouth At Bedtime - Per [facility] 5)  Centrum Multivitamins  Tabs (Multiple Vitamin) 6)  Fish Oil 1000 Mg Caps (Omega-3 Fatty Acids) .[name].. 1 Tablet By Mouth Daily 7)  Calcium 600/vitamin D 600-400 Mg-Unit Tabs (Calcium Carbonate-Vitamin D) .... 2 Tablets By Mouth Daily 8)  Glucosamine 1500 Complex  Caps (Glucosamine-Chondroit-Vit C-Mn) .[name].. 1 Tablet By Mouth Daily 9)  Vitamin C .... 500 International Units Daily 10)  Vitamin E .... 400  International Units Daily 11)  B-Complex Vitamins 12)  Colchicine 0.6 Mg Tabs (Colchicine) .[name].. 1 Tablet By Mouth  Every 2 Hours Up To 4 Tablets in 24 Hrs or Until You Get Diarrhea As Needed For Gout 13)  Risperdal 1 Mg Tabs (Risperidone) .[name].. 1 Mg Two Times A Day, Two Tabs At Bedtime- Prescribed By Behavioral Health 14)  Doxycycline Hyclate 100 Mg Caps (Doxycycline Hyclate) .... Take One Tablet Twice A Day For 7 Days 15)  Nystatin 100000 Unit/gm Powd (Nystatin) .... Apply 2-3 Times Daily To Affected Area  Allergies (verified): 1)  ! Sulfa 2)  Cogentin (Benztropine Mesylate) 3)  Lisinopril (Lisinopril) PMH-FH-SH reviewed for relevance  Review of Systems      See HPI  Physical Exam  General:  NAD. Alert and cooperative.  Skin:  area under left breast macerated, consistant with intertrigo- has increased in area incolved under her breast and is now extending with some evidence of cellulitis overlying breast.  No pus or drainage, no fluctuance. Psych:  Oriented X3, memory intact for recent and remote, not anxious appearing, and not depressed appearing.  No obvious hallucinations or delusions.   Impression & Recommendations:  Problem # 1:  INTERTRIGO, CANDIDAL (ICD-695.89)  Worse, now with some cellultis.  Sulfa allergy- will start doxycycline 100 bid x 7 days.  Discussed importance of keeping area dry and using nystatin powder.  Advised again to use supportive bra.  Will  follow-up with me in clinic 4 days.  Orders: [facility]- Est Level  3 ([address])  Problem # 2:  SCHIZOAFFECTIVE DISORDER (ICD-295.70)  Stable s/p behavioral health hospitlization.  No immediate needs today.  f/u with psych as previosuly scheduled.  Orders: [facility]- Est Level  3 ([address])  Complete Medication List: 1)  Cardizem Cd 120 Mg Xr24h-cap (Diltiazem hcl coated beads) .... One tablet daily- please use in place of short acting cardizem 2)  Baby Aspirin 81 Mg Chew (Aspirin) .... One tablet daily 3)  Depakote 500 Mg Tbec (Divalproex sodium) .... Once tablet daily and two tablets at bedtime 4)  Clonazepam 0.5 Mg Tabs (Clonazepam) .[name].. 1  tablet by mouth at bedtime - per [facility] 5)  Centrum Multivitamins Tabs (multiple Vitamin)  6)  Fish Oil 1000 Mg Caps (Omega-3 fatty acids) .[name].. 1 tablet by mouth daily 7)  Calcium 600/vitamin D 600-400 Mg-unit Tabs (Calcium carbonate-vitamin d) .... 2 tablets by mouth daily 8)  Glucosamine 1500 Complex Caps (Glucosamine-chondroit-vit c-mn) .[name].. 1 tablet by mouth daily 9)  Vitamin C  .... 500 international units daily 10)  Vitamin E  .... 400  international units daily 11)  B-complex Vitamins  12)  Colchicine 0.6 Mg Tabs (Colchicine) .[name].. 1 tablet by mouth every 2 hours up to 4 tablets in 24 hrs or until you get diarrhea as needed for gout 13)  Risperdal 1 Mg Tabs (Risperidone) .[name].. 1 mg two times a day, two tabs at bedtime- prescribed by behavioral health 14)  Doxycycline Hyclate 100 Mg Caps (Doxycycline hyclate) .... Take one tablet twice a day for 7 days 15)  Nystatin 100000 Unit/gm Powd (Nystatin) .... Apply 2-3 times daily to affected area  Patient Instructions: 1)  keep area under breast dry- use nystain powder-  Do not use oils or creams. 2)  Start antibiotic- doxycycline 3)  Make follow-up for rechek on Monday. Prescriptions: NYSTATIN 100000 UNIT/GM POWD (NYSTATIN) apply 2-3 times daily to affected area  #1 x 1   Entered and Authorized by:   Delbert Harness MD   Signed by:   Delbert Harness MD on 12/01/2009   Method used:   Faxed to ...       [facility] (retail)       [address]       [place], [place]  [address]       Ph: [phone]       Fax: [phone]   RxID:   [id number] DOXYCYCLINE HYCLATE 100 MG CAPS (DOXYCYCLINE HYCLATE) take one tablet twice a day for 7 days  #14 x 0   Entered and Authorized by:   Delbert Harness MD   Signed by:   Delbert Harness MD on 12/01/2009   Method used:   Faxed to ...       [facility] (retail)       [address]       [place], [place]  [address]       Ph: [phone]        Fax: [phone]   RxID:   [id number] NYSTATIN 100000 UNIT/GM POWD (NYSTATIN) apply 2-3 times daily to affected area  #1 x 1   Entered and Authorized by:   Delbert Harness MD   Signed by:   Delbert Harness MD on 12/01/2009   Method used:   Print then Give to Patient   RxID:  [id number] DOXYCYCLINE HYCLATE 100 MG CAPS (DOXYCYCLINE HYCLATE) take one tablet twice a day for 7 days  #14 x 0   Entered and Authorized by:   Delbert Harness MD   Signed by:   Delbert Harness MD on 12/01/2009   Method used:   Print then Give to Patient   RxID:   [id number]

## 2010-05-15 NOTE — Miscellaneous (Signed)
Summary: needs hosp f/u  Clinical Lists Changes her family will call us to schedule a f/u when she gets out of [employer].Golden Circle RN  November 14, 2009 3:14 PM  I discussed this with patient while she was in the hospital as well. Delbert Harness MD  November 14, 2009 9:57 PM
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
## 2010-05-15 NOTE — Progress Notes (Signed)
Summary: triage  Phone Note Call from Patient Call back at Home Phone [phone]   Caller: Patient Summary of Call: Pt having pain in back and asking to be seen today. Initial call taken by: Clydell Hakim,  January 01, 2010 11:25 AM  Follow-up for Phone Call        states her knee is bothering her & "it feels like it is twisted" not taking any meds. offered her work in at 3. she is aware of wait & will be here Follow-up by: Golden Circle RN,  January 01, 2010 11:30 AM

## 2010-05-15 NOTE — Progress Notes (Signed)
Summary: triage  Phone Note Call from Patient Call back at Home Phone [phone]   Caller: Patient Summary of Call: Pt having swelling in thigh and foot. Initial call taken by: Clydell Hakim,  January 08, 2010 3:50 PM  Follow-up for Phone Call        states she has pain & swelling in leg. taking codiene & tylenol. still hurting. appt tomorrow am to see her pcp. states she will get her depakote level drawn at that time since "I already have the order" Follow-up by: Golden Circle RN,  January 08, 2010 4:31 PM
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
## 2010-05-15 NOTE — Progress Notes (Signed)
Summary: Red eye, eye pain  Phone Note Call from Patient   Summary of Call: Patient called earlier with what sounds to be subconjunctival hemorrhage which occurred yesterday.  States he vision is blurry and the whites of her eyes are red and has some discomfort in that eye now.  Recommended she go to Urgent care or ER for further evaluation. Initial call taken by: Delbert Harness MD,  June 11, 2009 2:18 PM
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
## 2010-05-15 NOTE — Progress Notes (Signed)
Summary: triage  Phone Note Call from Patient Call back at [phone]   Caller: Patient Summary of Call: Pt says she had a seizure on Monday and had to go to the ed. Initial call taken by: Clydell Hakim,  November 07, 2009 3:42 PM  Follow-up for Phone Call        LM Follow-up by: Golden Circle RN,  November 07, 2009 3:45 PM  Additional Follow-up for Phone Call Additional follow up Details #1::        LM Additional Follow-up by: Golden Circle RN,  November 08, 2009 8:53 AM    Additional Follow-up for Phone Call Additional follow up Details #2::    pt called back and made appt for Friday 7/29 Follow-up by: De Nurse,  November 08, 2009 2:35 PM
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
## 2010-05-15 NOTE — Assessment & Plan Note (Signed)
Summary: leg pain & swelling/[name]/[name]   Vital Signs:  Patient profile:   70 year old female Weight:      157.3 pounds Pulse rate:   80 / minute BP sitting:   160 / 76  (right arm)  Vitals Entered By: [name] CMA, (January 09, 2010 8:28 AM) CC: discuss meds and c/o knee pain x 1 week. Is Patient Diabetic? No Pain Assessment Patient in pain? yes     Location: knees Intensity: 8 Onset of pain  x 1 week   Primary Care Provider:  Delbert Harness MD  CC:  discuss meds and c/o knee pain x 1 week.[name]  History of Present Illness: 70 yo here for leg swelling  right leg and thigh swelling- fell one week ago.  Has normal hip and leg xray.  Pain is improved but noticed sweling of right thigh and leg.  No calf pain. No prolonged sitting, car ride.  no fevers, numbness tingling weakness.  HYPERTENSION Meds: Taking and tolerating? yes, but not today Home BP's: no- but says she can do it once a week Chest Pain: no Dyspnea: no  Psych:  seeing pDr. Alisia [name] for management.  Started on Depakote.  States its is going well and she is compiant.  Requests I draw labs today.  Habits & Providers  Alcohol-Tobacco-Diet     Tobacco Status: never  Current Medications (verified): 1)  Cardizem Cd 120 Mg Xr24h-Cap (Diltiazem Hcl Coated Beads) .... One Tablet Daily- Please Use in Place of Short Acting Cardizem 2)  Baby Aspirin 81 Mg  Chew (Aspirin) .... One Tablet Daily 3)  Depakote 500 Mg Tbec (Divalproex Sodium) .... Once Tablet Daily and Two Tablets At Bedtime 4)  Centrum Multivitamins  Tabs (Multiple Vitamin) 5)  Fish Oil 1000 Mg Caps (Omega-3 Fatty Acids) .[name].. 1 Tablet By Mouth Daily 6)  Calcium 600/vitamin D 600-400 Mg-Unit Tabs (Calcium Carbonate-Vitamin D) .... 2 Tablets By Mouth Daily 7)  Glucosamine 1500 Complex  Caps (Glucosamine-Chondroit-Vit C-Mn) .[name].. 1 Tablet By Mouth Daily 8)  Vitamin C .... 500 International Units Daily 9)  Vitamin E .... 400  International Units Daily 10)   B-Complex Vitamins 11)  Colchicine 0.6 Mg Tabs (Colchicine) .[name].. 1 Tablet By Mouth Every 2 Hours Up To 4 Tablets in 24 Hrs or Until You Get Diarrhea As Needed For Gout 12)  Risperdal 1 Mg Tabs (Risperidone) .[name].. 1 Mg Two Times A Day, Two Tabs At Bedtime- Prescribed By Behavioral Health 13)  Oxycodone Hcl 5 Mg Tabs (Oxycodone Hcl) .... Sig Take 1 Tab By Mouth Every 6 Hours As Needed For Extreme Pain Disp #20 14)  Acetaminophen 650 Mg Cr-Tabs (Acetaminophen) .... Sig Take 1 Tab By Mouth Every 6 Hours As Needed For Pain 15)  Calcium + D 600-200 Mg-Unit Tabs (Calcium Carbonate-Vitamin D) .... Otc 1200 Mg Calcium With 800 Mg Vit D  Allergies: 1)  ! Sulfa 2)  Cogentin (Benztropine Mesylate) 3)  Lisinopril (Lisinopril) PMH-FH-SH reviewed for relevance  Review of Systems      See HPI  Physical Exam  General:  well appearing, no apparent distress.  Walked into room with rolling walker. Lungs:  CTAB, nonlabored Heart:  RRR, no murmurs, rubs, or gallops.  Extremities:  Right leg 36.5 cm at calf, left leg 35 cm.  no calf erythema, pain, or palpable cord.   Impression & Recommendations:  Problem # 1:  LEG EDEMA, BILATERAL (ICD-782.3) has had bilateral in past but today it is just right leg.  Given calf size discrepancies will get D-Dimer.  Swelling may be due to recent trauma, sees to be healing as expected.  Only new med is depakote.  Orders: D-Dimer- [facility] ([id number]) LE Venous Duplex (DVT) (DVT) [facility]- Est  Level 4 ([id number])  Addendum: D-Dimer elevated.  Will order LE doppler to r/o DVT.  Problem # 2:  HYPERTENSION, BENIGN SYSTEMIC (ICD-401.1) not well controlled. patien states she will not consider change in medications at this time.  Encouraged monitoring blood pressure at home.  Her updated medication list for this problem includes:    Cardizem Cd 120 Mg Xr24h-cap (Diltiazem hcl coated beads) ..... One tablet daily- please use in place of short acting cardizem  Orders: Comp Met-[facility]  ([id number]) [facility]- Est  Level 4 ([id number])  Problem # 3:  SCHIZOAFFECTIVE DISORDER (ICD-295.70)  Will check CBC, depakote, CMET, TSH per [name] request.  Orders: [facility]- Est  Level 4 ([id number])  Complete Medication List: 1)  Cardizem Cd 120 Mg Xr24h-cap (Diltiazem hcl coated beads) .... One tablet daily- please use in place of short acting cardizem 2)  Baby Aspirin 81 Mg Chew (Aspirin) .... One tablet daily 3)  Depakote 500 Mg Tbec (Divalproex sodium) .... Once tablet daily and two tablets at bedtime 4)  Centrum Multivitamins Tabs (multiple Vitamin)  5)  Fish Oil 1000 Mg Caps (Omega-3 fatty acids) .[name].. 1 tablet by mouth daily 6)  Calcium 600/vitamin D 600-400 Mg-unit Tabs (Calcium carbonate-vitamin d) .... 2 tablets by mouth daily 7)  Glucosamine 1500 Complex Caps (Glucosamine-chondroit-vit c-mn) .[name].. 1 tablet by mouth daily 8)  Vitamin C  .... 500 international units daily 9)  Vitamin E  .... 400  international units daily 10)  B-complex Vitamins  11)  Colchicine 0.6 Mg Tabs (Colchicine) .[name].. 1 tablet by mouth every 2 hours up to 4 tablets in 24 hrs or until you get diarrhea as needed for gout 12)  Risperdal 1 Mg Tabs (Risperidone) .[name].. 1 mg two times a day, two tabs at bedtime- prescribed by behavioral health 13)  Oxycodone Hcl 5 Mg Tabs (Oxycodone hcl) .... Sig take 1 tab by mouth every 6 hours as needed for extreme pain disp #20 14)  Acetaminophen 650 Mg Cr-tabs (Acetaminophen) .... Sig take 1 tab by mouth every 6 hours as needed for pain 15)  Calcium + D 600-200 Mg-unit Tabs (Calcium carbonate-vitamin d) .... Otc 1200 mg calcium with 800 mg vit d  Other Orders: CBC-[facility] ([id number]) Valproic Acid-[facility] ([id number]) TSH-[facility] ([id number])  Patient Instructions: 1)  I will order a lab test, if it is high, I will order an ultrasound to check for a clot in your leg. 2)  Write down your blood pressures and bring to your next visit 3)  I will send your labwork to [name] 4)  Follow-up in  1-2 months or sooner if needed.   Prevention & Chronic Care Immunizations   Influenza vaccine: given  (04/15/2009)   Influenza vaccine due: 04/15/2010    Tetanus booster: 10/13/2001: Done.   Tetanus booster due: 10/14/2011    Pneumococcal vaccine: Pneumovax  (01/27/2007)   Pneumococcal vaccine due: None    H. zoster vaccine: 11/07/2008: refused  Colorectal Screening   Hemoccult: Done.  (03/16/2003)   Hemoccult due: Not Indicated    Colonoscopy: normal  (01/18/2004)   Colonoscopy due: 01/2014  Other Screening   Pap smear: Normal  (09/14/2007)   Pap smear due: 09/14/2010    Mammogram: Done.  (04/15/1993)   Mammogram due: Refused  (06/13/2009)  DXA bone density scan: Done.  (08/13/2005)   DXA scan due: None    Smoking status: never  (01/09/2010)  Lipids   Total Cholesterol: 175  (06/27/2009)   LDL: 97  (06/27/2009)   LDL Direct: 114  (12/10/2006)   HDL: 59  (06/27/2009)   Triglycerides: 93  (06/27/2009)    SGOT (AST): 25  (06/27/2009)   SGPT (ALT): 18  (06/27/2009) CMP ordered    Alkaline phosphatase: 61  (06/27/2009)   Total bilirubin: 0.3  (06/27/2009)  Hypertension   Last Blood Pressure: 160 / 76  (01/09/2010)   Serum creatinine: 1.16  (06/27/2009)   Serum potassium 4.5  (06/27/2009) CMP ordered     Hypertension flowsheet reviewed?: Yes   Progress toward BP goal: Unchanged  Self-Management Support :    Hypertension self-management support: Written self-care plan  (03/27/2009)    Hypertension self-management support not done because: Refused  (01/09/2010)    Lipid self-management support: Not documented

## 2010-05-15 NOTE — Assessment & Plan Note (Signed)
Summary: rash under breasts/[place]   Vital Signs:  Patient profile:   70 year old female Weight:      159.1 pounds Temp:     97.6 degrees F oral Pulse rate:   105 / minute Pulse rhythm:   regular BP sitting:   180 / 89  (left arm) Cuff size:   regular  Vitals Entered By: Loralee Pacas CMA (November 28, 2009 4:36 PM)  Primary Care Provider:  Delbert Harness MD   History of Present Illness: 70 yo made appt for evaluation of intertrigo of left breast.  Recent discharge yesterday from behavioral health for psychosis in setting of poor compliance with medications for biplar/schizoaffective DO  1. left breast yeast:  macerated under skin.  Was given nystatin ointment at behavioral health which was switched to nystatin powder.  has only been usuing it for one day.  area painful.  no fever, drainage  2.  hospital discharge: psychosis:  was admitted to hospital for AMS, trasnferred to Behavioral health for psychosis.  Depakote level found to be low, history of noncompliance.  Depakote level increased, Risperdal started.  States "she is fine"  3. htn:  states her Bp was well controlled in the 130's while in the hospital but this mornign was high because she has not taken her meds and is getting back to her home regimen per patient.   Current Medications (verified): 1)  Cardizem Cd 120 Mg Xr24h-Cap (Diltiazem Hcl Coated Beads) .... One Tablet Daily- Please Use in Place of Short Acting Cardizem 2)  Baby Aspirin 81 Mg  Chew (Aspirin) .... One Tablet Daily 3)  Depakote 500 Mg Tbec (Divalproex Sodium) .... Once Tablet Daily and Two Tablets At Bedtime 4)  Clonazepam 0.5 Mg  Tabs (Clonazepam) .[name].. 1 Tablet By Mouth At Bedtime - Per [facility] 5)  Centrum Multivitamins  Tabs (Multiple Vitamin) 6)  Fish Oil 1000 Mg Caps (Omega-3 Fatty Acids) .[name].. 1 Tablet By Mouth Daily 7)  Calcium 600/vitamin D 600-400 Mg-Unit Tabs (Calcium Carbonate-Vitamin D) .... 2 Tablets By Mouth Daily 8)  Glucosamine 1500  Complex  Caps (Glucosamine-Chondroit-Vit C-Mn) .[name].. 1 Tablet By Mouth Daily 9)  Vitamin C .... 500 International Units Daily 10)  Vitamin E .... 400  International Units Daily 11)  B-Complex Vitamins 12)  Colchicine 0.6 Mg Tabs (Colchicine) .[name].. 1 Tablet By Mouth Every 2 Hours Up To 4 Tablets in 24 Hrs or Until You Get Diarrhea As Needed For Gout 13)  Risperdal 1 Mg Tabs (Risperidone) .[name].. 1 Mg Two Times A Day, Two Tabs At Bedtime- Prescribed By Behavioral Health  Allergies: 1)  ! Sulfa 2)  Cogentin (Benztropine Mesylate) 3)  Lisinopril (Lisinopril) PMH-FH-SH reviewed-no changes except otherwise noted  Review of Systems      See HPI  Physical Exam  General:  NAD. Alert and cooperative.  Talkative. Skin:  area unde leftbreat macerated, consistant with intertrigo Psych:  Oriented X3 and memory intact for recent and remote.  very talkative.  No obvious delusions.   Impression & Recommendations:  Problem # 1:  INTERTRIGO, CANDIDAL (ICD-695.89)  continue treatment with nystatin powder.  Advised using good support bra, buyign a sports bra if necessary to reduce moisture and friction on area. No evidence of cellulitis.   Will recheck on Friday.    Orders: [facility]- Est Level  3 (84132)  Problem # 2:  SCHIZOAFFECTIVE DISORDER (ICD-295.70)  discharged yesterday.  On new meds- brought in today and updated in med reconciliation.  No apparent  psychosis.  Somewhat mroe talkative than usual.  Will follow-up on friday.  Orders: [facility]- Est Level  3 (20254)  Problem # 3:  HYPERTENSION, BENIGN SYSTEMIC (ICD-401.1)  Poorly controlled.  Likely due to noncompliance.  Patient refuses additional medications or to address at this time.  Her updated medication list for this problem includes:    Cardizem Cd 120 Mg Xr24h-cap (Diltiazem hcl coated beads) ..... One tablet daily- please use in place of short acting cardizem  Orders: [facility]- Est Level  3 (27062)  Complete Medication List: 1)  Cardizem Cd  120 Mg Xr24h-cap (Diltiazem hcl coated beads) .... One tablet daily- please use in place of short acting cardizem 2)  Baby Aspirin 81 Mg Chew (Aspirin) .... One tablet daily 3)  Depakote 500 Mg Tbec (Divalproex sodium) .... Once tablet daily and two tablets at bedtime 4)  Clonazepam 0.5 Mg Tabs (Clonazepam) .[name].. 1 tablet by mouth at bedtime - per [facility] 5)  Centrum Multivitamins Tabs (multiple Vitamin)  6)  Fish Oil 1000 Mg Caps (Omega-3 fatty acids) .[name].. 1 tablet by mouth daily 7)  Calcium 600/vitamin D 600-400 Mg-unit Tabs (Calcium carbonate-vitamin d) .... 2 tablets by mouth daily 8)  Glucosamine 1500 Complex Caps (Glucosamine-chondroit-vit c-mn) .[name].. 1 tablet by mouth daily 9)  Vitamin C  .... 500 international units daily 10)  Vitamin E  .... 400  international units daily 11)  B-complex Vitamins  12)  Colchicine 0.6 Mg Tabs (Colchicine) .[name].. 1 tablet by mouth every 2 hours up to 4 tablets in 24 hrs or until you get diarrhea as needed for gout 13)  Risperdal 1 Mg Tabs (Risperidone) .[name].. 1 mg two times a day, two tabs at bedtime- prescribed by behavioral health   Prevention & Chronic Care Immunizations   Influenza vaccine: given  (04/15/2009)   Influenza vaccine due: 04/15/2010    Tetanus booster: 10/13/2001: Done.   Tetanus booster due: 10/14/2011    Pneumococcal vaccine: Pneumovax  (01/27/2007)   Pneumococcal vaccine due: None    H. zoster vaccine: 11/07/2008: refused  Colorectal Screening   Hemoccult: Done.  (03/16/2003)   Hemoccult due: Not Indicated    Colonoscopy: normal  (01/18/2004)   Colonoscopy due: 01/2014  Other Screening   Pap smear: Normal  (09/14/2007)   Pap smear due: 09/14/2010    Mammogram: Done.  (04/15/1993)   Mammogram due: Refused  (06/13/2009)    DXA bone density scan: Done.  (08/13/2005)   DXA scan due: None    Smoking status: never  (09/22/2009)  Lipids   Total Cholesterol: 175  (06/27/2009)   LDL: 97  (06/27/2009)   LDL  Direct: 114  (12/10/2006)   HDL: 59  (06/27/2009)   Triglycerides: 93  (06/27/2009)    SGOT (AST): 25  (06/27/2009)   SGPT (ALT): 18  (06/27/2009)   Alkaline phosphatase: 61  (06/27/2009)   Total bilirubin: 0.3  (06/27/2009)  Hypertension   Last Blood Pressure: 180 / 89  (11/28/2009)   Serum creatinine: 1.16  (06/27/2009)   Serum potassium 4.5  (06/27/2009)    Hypertension flowsheet reviewed?: Yes   Progress toward BP goal: Deteriorated  Self-Management Support :    Hypertension self-management support: Written self-care plan  (03/27/2009)    Hypertension self-management support not done because: Refused  (11/28/2009)    Lipid self-management support: Not documented

## 2010-05-15 NOTE — Letter (Signed)
Summary: Generic Letter  [facility]  [address]   [place], [place] [address]   Phone: [phone]  Fax: [phone]    01/01/2010  [address] [place], [place]  [address]  Dear Ms. Ramstad,   It was a pleasure to see you today.  I just received the results of your right hip and right knee x-rays today.  They were both negative for fractures, but they did show some wear and tear arthritis.   I recommend you to follow up if you continue to have the hip and knee pain.         Sincerely,   Paula Compton MD  Appended Document: Generic Letter mailed

## 2010-05-15 NOTE — Assessment & Plan Note (Signed)
Summary: cpe,df   Vital Signs:  Patient profile:   70 year old female Height:      62.75 inches Weight:      155.7 pounds BMI:     27.90 Temp:     97.7 degrees F Pulse rate:   74 / minute BP sitting:   159 / 75  (left arm) Cuff size:   regular  Vitals Entered By: Gladstone Pih (September 22, 2009 8:44 AM)  CC: CPE Is Patient Diabetic? No Pain Assessment Patient in pain? no        Primary Care Provider:  Delbert Harness MD  CC:  CPE.  History of Present Illness: 70 yo here for annual physical.  HYPERTENSION Meds: Taking and tolerating? yes Chest Pain: no Dyspnea: no edema: no  Gout: well controlled with intermittant use of colchicine.   Habits & Providers  Alcohol-Tobacco-Diet     Tobacco Status: never  Allergies: 1)  ! Sulfa 2)  Cogentin (Benztropine Mesylate) 3)  Lisinopril (Lisinopril) PMH-FH-SH reviewed-no changes except otherwise noted  Review of Systems      See HPI General:  Denies loss of appetite, sleep disorder, and weight loss. Eyes:  Denies blurring, double vision, and light sensitivity. ENT:  Denies decreased hearing. CV:  Denies chest pain or discomfort, difficulty breathing at night, difficulty breathing while lying down, leg cramps with exertion, lightheadness, shortness of breath with exertion, and swelling of feet. Resp:  Denies cough and shortness of breath. GI:  Denies abdominal pain, bloody stools, and change in bowel habits. GU:  Denies abnormal vaginal bleeding.  Physical Exam  General:  Well-developed,well-nourished,in no acute distress; alert,appropriate and cooperative throughout examination.  Well dressed.wearing large brimmed hat and sunglasses over her glasses.  Vitals reviewed.  Manual recheck 138/72 Eyes:  pupils equal, pupils round, and pupils reactive to light.   Mouth:  Oral mucosa and oropharynx without lesions or exudates.  Teeth in good repair- upper dentures in place. Neck:  No deformities, masses, or tenderness  noted. Lungs:  Normal respiratory effort, chest expands symmetrically. Lungs are clear to auscultation, no crackles or wheezes. Heart:  Normal rate and regular rhythm. S1 and S2 normal without gallop, murmur, click, rub or other extra sounds. Abdomen:  Bowel sounds positive,abdomen soft and non-tender without masses, organomegaly or hernias noted. Extremities:  no LE edema.   Impression & Recommendations:  Problem # 1:  PREVENTIVE HEALTH CARE (ICD-V70.0)  Reviewed and patient agreeable to discontinuing screening and declines mammogram, colonoscopy, pap smears which are reasonable at her age.  Declines zostavax.  Weight stable.  Doing well.  Orders: [facility] - Est  65+ [phone])  Problem # 2:  HYPERTENSION, BENIGN SYSTEMIC (ICD-401.1)  Well controlled on current meds.  Return in 6 months.  Her updated medication list for this problem includes:    Cardizem Cd 120 Mg Xr24h-cap (Diltiazem hcl coated beads) ..... One tablet daily- please use in place of short acting cardizem  Orders: [facility] - Est  65+ [phone])  Problem # 3:  HYPERCHOLESTEROLEMIA (ICD-272.0) Reviewed results with patient- at goal.  Orders: [facility] - Est  65+ [phone])  Complete Medication List: 1)  Cardizem Cd 120 Mg Xr24h-cap (Diltiazem hcl coated beads) .... One tablet daily- please use in place of short acting cardizem 2)  Baby Aspirin 81 Mg Chew (Aspirin) .... One tablet daily 3)  Depakote 250 Mg Tbec (Divalproex sodium) .[name].. 1 tablets by mouth at bedtime - per [facility]- dr.plovsky 4)  Clonazepam 0.5 Mg Tabs (Clonazepam) .Marland KitchenMarland KitchenMarland Kitchen  1 tablet by mouth at bedtime - per [facility] 5)  Centrum Multivitamins Tabs (multiple Vitamin)  6)  Fish Oil 1000 Mg Caps (Omega-3 fatty acids) .[name].. 1 tablet by mouth daily 7)  Calcium 600/vitamin D 600-400 Mg-unit Tabs (Calcium carbonate-vitamin d) .... 2 tablets by mouth daily 8)  Glucosamine 1500 Complex Caps (Glucosamine-chondroit-vit c-mn) .[name].. 1 tablet by mouth daily 9)  Vitamin C  ....  500 international units daily 10)  Vitamin E  .... 200 international units daily 11)  B-complex Vitamins  12)  Colchicine 0.6 Mg Tabs (Colchicine) .[name].. 1 tablet by mouth every 2 hours up to 4 tablets in 24 hrs or until you get diarrhea as needed for gout  Patient Instructions: 1)  Everything looks good today! 2)  Follow-up in 6 months! 3)  Have a good summer!   Prevention & Chronic Care Immunizations   Influenza vaccine: given  (04/15/2009)   Influenza vaccine due: 04/15/2010    Tetanus booster: 10/13/2001: Done.   Tetanus booster due: 10/14/2011    Pneumococcal vaccine: Pneumovax  (01/27/2007)   Pneumococcal vaccine due: None    H. zoster vaccine: 11/07/2008: refused  Colorectal Screening   Hemoccult: Done.  (03/16/2003)   Hemoccult due: Not Indicated    Colonoscopy: normal  (01/18/2004)   Colonoscopy due: 01/2014  Other Screening   Pap smear: Normal  (09/14/2007)   Pap smear due: 09/14/2010    Mammogram: Done.  (04/15/1993)   Mammogram due: Refused  (06/13/2009)    DXA bone density scan: Done.  (08/13/2005)   DXA scan due: None    Smoking status: never  (09/22/2009)  Lipids   Total Cholesterol: 175  (06/27/2009)   LDL: 97  (06/27/2009)   LDL Direct: 114  (12/10/2006)   HDL: 59  (06/27/2009)   Triglycerides: 93  (06/27/2009)    SGOT (AST): 25  (06/27/2009)   SGPT (ALT): 18  (06/27/2009)   Alkaline phosphatase: 61  (06/27/2009)   Total bilirubin: 0.3  (06/27/2009)  Hypertension   Last Blood Pressure: 159 / 75  (09/22/2009)   Serum creatinine: 1.16  (06/27/2009)   Serum potassium 4.5  (06/27/2009)    Hypertension flowsheet reviewed?: Yes   Progress toward BP goal: At goal  Self-Management Support :    Hypertension self-management support: Written self-care plan  (03/27/2009)    Lipid self-management support: Not documented

## 2010-05-15 NOTE — Progress Notes (Signed)
Summary: triage  Phone Note Call from Patient Call back at Home Phone [phone]   Caller: Patient Summary of Call: Pt has rash under her breast with little red pimples. Initial call taken by: Clydell Hakim,  November 28, 2009 10:09 AM  Follow-up for Phone Call        started 8/10 while she was in [facility]. burns & hurts per pt. appt today with pcp Follow-up by: Golden Circle RN,  November 28, 2009 10:10 AM

## 2010-05-15 NOTE — Assessment & Plan Note (Signed)
Summary: f/u visit/[facility]   Vital Signs:  Patient profile:   70 year old female Weight:      153.2 pounds Temp:     98.1 degrees F oral Pulse rate:   99 / minute Pulse rhythm:   regular BP sitting:   152 / 92  (left arm) Cuff size:   regular  Vitals Entered By: Loralee Pacas CMA (February 21, 2010 2:30 PM) CC: follow-up visit   Primary Care Henrique Parekh:  Delbert Harness MD  CC:  follow-up visit.  History of Present Illness: 70 yo here to discuss BP and knee pain  right knee pain:  chornic- known arthritis, gradually worsening through the years.  Was seen for physical therapy during her last hospitlization for psychiatric issues.  Today she requests a cane.  No falls but feels pain is worsening an limiting mobility.  Tylenol for pain  HYPERTENSION Meds: Taking and tolerating? 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 no     Have you felt little pleasure in things? no     Depression Counseling: not indicated; screening negative for depression     Seat Belt Use: always  Current Medications (verified): 1)  Cardizem Cd 120 Mg Xr24h-Cap (Diltiazem Hcl Coated Beads) .... One Tablet Daily- Please Use in Place of Short Acting Cardizem 2)  Baby Aspirin 81 Mg  Chew (Aspirin) .... One Tablet Daily 3)  Depakote 500 Mg Tbec (Divalproex Sodium) .... Once Tablet Daily and Two Tablets At Bedtime 4)  Centrum Multivitamins  Tabs (Multiple Vitamin) 5)  Fish Oil 1000 Mg Caps (Omega-3 Fatty Acids) .[name].. 1 Tablet By Mouth Daily 6)  Calcium 600/vitamin D 600-400 Mg-Unit Tabs (Calcium Carbonate-Vitamin D) .... 2 Tablets By Mouth Daily 7)  Glucosamine 1500 Complex  Caps (Glucosamine-Chondroit-Vit  C-Mn) .[name].. 1 Tablet By Mouth Daily 8)  Vitamin C .... 500 International Units Daily 9)  Vitamin E .... 400  International Units Daily 10)  B-Complex Vitamins 11)  Colchicine 0.6 Mg Tabs (Colchicine) .[name].. 1 Tablet By Mouth Every 2 Hours Up To 4 Tablets in 24 Hrs or Until You Get Diarrhea As Needed For Gout 12)  Risperdal 1 Mg Tabs (Risperidone) .[name].. 1 Mg Two Times A Day, Two Tabs At Bedtime- Prescribed By Behavioral Health 13)  Oxycodone Hcl 5 Mg Tabs (Oxycodone Hcl) .... Sig Take 1 Tab By Mouth Every 6 Hours As Needed For Extreme Pain Disp #20 14)  Acetaminophen 650 Mg Cr-Tabs (Acetaminophen) .... Sig Take 1 Tab By Mouth Every 6 Hours As Needed For Pain 15)  Calcium + D 600-200 Mg-Unit Tabs (Calcium Carbonate-Vitamin D) .... Otc 1200 Mg Calcium With 800 Mg Vit D  Allergies: 1)  ! Sulfa 2)  Cogentin (Benztropine Mesylate) 3)  Lisinopril (Lisinopril) PMH-FH-SH reviewed for relevance  Social History: Does Patient Exercise:  yes Seat Belt Use:  always  Review of Systems      See HPI  Physical Exam  General:  well appearing, no apparent distress.   Msk:  no LE edema.  No knee effusion, pain on palpation, full range of motion with no significant pain. Neurologic:  5/5 strength LE.  patellar reflexes difficult to  ilicit bilaterally.   Impression & Recommendations:  Problem # 1:  LEG PAIN, RIGHT (ICD-729.5)  right knee pain.  XR or right knee 12/22/09 shows : "Mild osteoarthritis of the patellofemoral compartment."  laso has arhtritis of right hip.  Will refer to phsycial therapy for knee pain and evaluation for cane or other assistive device.  Orders: Physical Therapy Referral (PT)  Problem # 2:  HYPERTENSION, BENIGN SYSTEMIC (ICD-401.1)  elevated today.  BP at home at goal.  No changes today.  Her updated medication list for this problem includes:    Cardizem Cd 120 Mg Xr24h-cap (Diltiazem hcl coated beads) ..... One tablet daily- please use in place of short acting  cardizem  Orders: [facility]- Est Level  3 (16109)  Complete Medication List: 1)  Cardizem Cd 120 Mg Xr24h-cap (Diltiazem hcl coated beads) .... One tablet daily- please use in place of short acting cardizem 2)  Baby Aspirin 81 Mg Chew (Aspirin) .... One tablet daily 3)  Depakote 500 Mg Tbec (Divalproex sodium) .... Once tablet daily and two tablets at bedtime 4)  Centrum Multivitamins Tabs (multiple Vitamin)  5)  Fish Oil 1000 Mg Caps (Omega-3 fatty acids) .[name].. 1 tablet by mouth daily 6)  Calcium 600/vitamin D 600-400 Mg-unit Tabs (Calcium carbonate-vitamin d) .... 2 tablets by mouth daily 7)  Glucosamine 1500 Complex Caps (Glucosamine-chondroit-vit c-mn) .[name].. 1 tablet by mouth daily 8)  Vitamin C  .... 500 international units daily 9)  Vitamin E  .... 400  international units daily 10)  B-complex Vitamins  11)  Colchicine 0.6 Mg Tabs (Colchicine) .[name].. 1 tablet by mouth every 2 hours up to 4 tablets in 24 hrs or until you get diarrhea as needed for gout 12)  Risperdal 1 Mg Tabs (Risperidone) .[name].. 1 mg two times a day, two tabs at bedtime- prescribed by behavioral health 13)  Oxycodone Hcl 5 Mg Tabs (Oxycodone hcl) .... Sig take 1 tab by mouth every 6 hours as needed for extreme pain disp #20 14)  Acetaminophen 650 Mg Cr-tabs (Acetaminophen) .... Sig take 1 tab by mouth every 6 hours as needed for pain 15)  Calcium + D 600-200 Mg-unit Tabs (Calcium carbonate-vitamin d) .... Otc 1200 mg calcium with 800 mg vit d  Patient Instructions: 1)  We will refer you to physical therapy to talk about canes and to work on your knee. 2)  keep taking tylenol arthritis for pain 3)  keep log of blood pressures and bring to next visit- your goal bp is less than 140/90.   Orders Added: 1)  Physical Therapy Referral [PT] 2)  [facility]- Est Level  3 [60454]     Prevention & Chronic Care Immunizations   Influenza vaccine: given  (04/15/2009)   Influenza vaccine deferral: Refused  (02/21/2010)   Influenza vaccine  due: 04/15/2010    Tetanus booster: 10/13/2001: Done.   Tetanus booster due: 10/14/2011    Pneumococcal vaccine: Pneumovax  (01/27/2007)   Pneumococcal vaccine due: None    H. zoster vaccine: 11/07/2008: refused  Colorectal Screening   Hemoccult: Done.  (03/16/2003)   Hemoccult due: Not Indicated    Colonoscopy: normal  (01/18/2004)   Colonoscopy due: 01/2014  Other Screening   Pap smear: Normal  (09/14/2007)   Pap smear due: 09/14/2010    Mammogram: Done.  (04/15/1993)   Mammogram due: Refused  (06/13/2009)    DXA bone density scan: Done.  (08/13/2005)   DXA scan due: None    Smoking status: never  (02/21/2010)  Lipids   Total Cholesterol: 175  (06/27/2009)   LDL: 97  (06/27/2009)   LDL Direct: 114  (12/10/2006)   HDL: 59  (06/27/2009)   Triglycerides: 93  (06/27/2009)    SGOT (AST): 50  (01/09/2010)   SGPT (ALT): 45  (01/09/2010)   Alkaline phosphatase: 42  (01/09/2010)   Total bilirubin: 0.3  (01/09/2010)  Hypertension   Last Blood Pressure: 152 / 92  (02/21/2010)   Serum creatinine: 1.16  (01/09/2010)   Serum potassium 4.2  (01/09/2010)    Hypertension flowsheet reviewed?: Yes   Progress toward BP goal: Improved  Self-Management Support :   Personal Goals (by the next clinic visit) :      Personal blood pressure goal: 140/90  (02/21/2010)   Patient will work on the following items until the next clinic visit to reach self-care goals:     Medications and monitoring: take my medicines every day, check my blood pressure  (02/21/2010)     Eating: eat more vegetables, eat foods that are low in salt  (03/27/2009)    Hypertension self-management support: Written self-care plan  (02/21/2010)   Hypertension self-care plan printed.    Hypertension self-management support not done because: Refused  (01/09/2010)    Lipid self-management support: Not documented

## 2010-05-15 NOTE — Progress Notes (Signed)
Summary: eye red (?subconjunctival hemorrhage)  Blood in her eye started at 3 pm, felt a pop and then saw blood in her left eye. No pain, no blurry vision.  PT had been "vigorously". Sounds like she has a subconjunctival hemorrhage. Advised pt to not be concerned unless her vision changes or she develops eye pain. Then come into ED if she develops these symptoms. She says she can see well with her glasses on.  Jamie Brookes MD  June 10, 2009 3:55 PM

## 2010-05-15 NOTE — Assessment & Plan Note (Signed)
Summary: F/U intertrigo/KH   Vital Signs:  Patient profile:   70 year old female Height:      62.75 inches Weight:      157.6 pounds Pulse rate:   79 / minute BP sitting:   128 / 78  (right arm)  Vitals Entered By: Arlyss Repress CMA, (December 05, 2009 8:34 AM) CC: rash under brestsa Is Patient Diabetic? No Pain Assessment Patient in pain? no        Primary Care Maimouna Rondeau:  Delbert Harness MD  CC:  rash under brestsa.  History of Present Illness: 70 yo here for follow-up of intertigo   Under left breast- got a supportive bra, has been using nystatin powder and doxycycline.  Redness improved, no skin weeping.   Habits & Providers  Alcohol-Tobacco-Diet     Tobacco Status: never  Current Medications (verified): 1)  Cardizem Cd 120 Mg Xr24h-Cap (Diltiazem Hcl Coated Beads) .... One Tablet Daily- Please Use in Place of Short Acting Cardizem 2)  Baby Aspirin 81 Mg  Chew (Aspirin) .... One Tablet Daily 3)  Depakote 500 Mg Tbec (Divalproex Sodium) .... Once Tablet Daily and Two Tablets At Bedtime 4)  Clonazepam 0.5 Mg  Tabs (Clonazepam) .[name].. 1 Tablet By Mouth At Bedtime - Per [facility] 5)  Centrum Multivitamins  Tabs (Multiple Vitamin) 6)  Fish Oil 1000 Mg Caps (Omega-3 Fatty Acids) .[name].. 1 Tablet By Mouth Daily 7)  Calcium 600/vitamin D 600-400 Mg-Unit Tabs (Calcium Carbonate-Vitamin D) .... 2 Tablets By Mouth Daily 8)  Glucosamine 1500 Complex  Caps (Glucosamine-Chondroit-Vit C-Mn) .[name].. 1 Tablet By Mouth Daily 9)  Vitamin C .... 500 International Units Daily 10)  Vitamin E .... 400  International Units Daily 11)  B-Complex Vitamins 12)  Colchicine 0.6 Mg Tabs (Colchicine) .[name].. 1 Tablet By Mouth Every 2 Hours Up To 4 Tablets in 24 Hrs or Until You Get Diarrhea As Needed For Gout 13)  Risperdal 1 Mg Tabs (Risperidone) .[name].. 1 Mg Two Times A Day, Two Tabs At Bedtime- Prescribed By Behavioral Health 14)  Doxycycline Hyclate 100 Mg Caps (Doxycycline Hyclate) .... Take One Tablet  Twice A Day For 7 Days 15)  Nystatin 100000 Unit/gm Powd (Nystatin) .... Apply 2-3 Times Daily To Affected Area  Allergies: 1)  ! Sulfa 2)  Cogentin (Benztropine Mesylate) 3)  Lisinopril (Lisinopril) PMH-FH-SH reviewed-no changes except otherwise noted  Review of Systems      See HPI General:  Denies chills and fever.  Physical Exam  General:  NAD. Alert and cooperative.  Skin:  area under left breast no longer macerated- muc improved.  No weeping.  Residual post-inflammatory changes.  Brest with residual redness, not as warm.  No pus or drainage, no fluctuance.   Impression & Recommendations:  Problem # 1:  INTERTRIGO, CANDIDAL (ICD-695.89)  much improved.  Urged to continue nystatin, finish doxycycline course, and keep area dry.  Given red flags for return.  Orders: [facility]- Est Level  3 ([address])  Complete Medication List: 1)  Cardizem Cd 120 Mg Xr24h-cap (Diltiazem hcl coated beads) .... One tablet daily- please use in place of short acting cardizem 2)  Baby Aspirin 81 Mg Chew (Aspirin) .... One tablet daily 3)  Depakote 500 Mg Tbec (Divalproex sodium) .... Once tablet daily and two tablets at bedtime 4)  Clonazepam 0.5 Mg Tabs (Clonazepam) .[name].. 1 tablet by mouth at bedtime - per [facility] 5)  Centrum Multivitamins Tabs (multiple Vitamin)  6)  Fish Oil 1000 Mg Caps (Omega-3  fatty acids) .[name].. 1 tablet by mouth daily 7)  Calcium 600/vitamin D 600-400 Mg-unit Tabs (Calcium carbonate-vitamin d) .... 2 tablets by mouth daily 8)  Glucosamine 1500 Complex Caps (Glucosamine-chondroit-vit c-mn) .[name].. 1 tablet by mouth daily 9)  Vitamin C  .... 500 international units daily 10)  Vitamin E  .... 400  international units daily 11)  B-complex Vitamins  12)  Colchicine 0.6 Mg Tabs (Colchicine) .[name].. 1 tablet by mouth every 2 hours up to 4 tablets in 24 hrs or until you get diarrhea as needed for gout 13)  Risperdal 1 Mg Tabs (Risperidone) .[name].. 1 mg two times a day, two tabs at bedtime-  prescribed by behavioral health 14)  Doxycycline Hyclate 100 Mg Caps (Doxycycline hyclate) .... Take one tablet twice a day for 7 days 15)  Nystatin 100000 Unit/gm Powd (Nystatin) .... Apply 2-3 times daily to affected area  Patient Instructions: 1)  keep the area dry, with bra support, nystatin powder.  finish up yoru antibiotics 2)  make appt if redness does not go away, or you get fever, drainage, weeping. 3)  Make follow-up with psychiatry 4)  Follow-up with me in 1-2 months

## 2010-05-15 NOTE — Progress Notes (Signed)
  Phone Note Outgoing Call   Call placed by: Paula Compton MD,  January 01, 2010 4:38 PM Call placed to: Patient Summary of Call: Tried to call patient to report negative hip and knee xrays.  No answer at home number, no way to leave message.  Letter sent.  Initial call taken by: Paula Compton MD,  January 01, 2010 4:40 PM

## 2010-05-15 NOTE — Letter (Signed)
Summary: Results Follow-up Letter  [facility]  [address]   [place], [place] [address]   Phone: [phone]  Fax: [phone]    01/15/2010  [address] [place], [place]  [address]  Dear Ms. Kuhner,   The following are the results of your recent test(s):  Your ultrasound of your legs shows no blood clots.  I drew your labs as requested and forwarded them to Dr. Donell Beers.  Your hemoglobin is a little lower than usual. I advise you continue taking your multivitamin and we monitor periodically.  Please let me know if you have further questions.   Sincerely,  Delbert Harness MD [facility]           Appended Document: Results Follow-up Letter mailed

## 2010-05-15 NOTE — Progress Notes (Signed)
Summary: triage  Phone Note Call from Patient Call back at Home Phone [phone]   Caller: Patient Summary of Call: needs to talk about nosebleeds Initial call taken by: De Nurse,  April 20, 2009 4:09 PM  Follow-up for Phone Call        picked her nose & had a nosebleed.  states she will not do this again. had a nosebleed after blowing vigorously. explained that the tiny blood vessals at durface of nose will bleed if air is dry & you blow hard or pick. advised humidifier in room. may use sale spray to lossen mucous before blowing. she agreed with plan Follow-up by: Golden Circle RN,  April 20, 2009 4:11 PM  Additional Follow-up for Phone Call Additional follow up Details #1::        Agree. Additional Follow-up by: Delbert Harness MD,  April 24, 2009 8:46 AM

## 2010-05-15 NOTE — Assessment & Plan Note (Signed)
Summary: back or knee pain/[place]   Vital Signs:  Patient profile:   70 year old female Height:      62.75 inches Weight:      157 pounds BMI:     28.13 Temp:     98.4 degrees F Pulse rate:   82 / minute BP sitting:   160 / 75  (left arm)  Vitals Entered By: Theresia Lo RN (January 01, 2010 1:36 PM) CC: back and knee pain. right hip pain..fell on Friday Is Patient Diabetic? No Pain Assessment Patient in pain? yes     Location: back Intensity: 10 Type: ache   Primary Care Sindee Stucker:  Delbert Harness MD  CC:  back and knee pain. right hip pain..fell on Friday.  History of Present Illness: Patient seen today as same-day; she experienced a slip-and-fall while hanging clothes in a closet on Friday, Sept 16th. Lost balance and fell back, striking her R buttock on floor.  She is adamant that she did not have presyncope or syncope, no chest pain, no dizziness and no seizure activity.  Initially did not feel pain, but noticed increasing sorenes and stiffness when trying to get out of bed on Sat and Sunday.  Was able to get around her home with her rolling walker, which she uses at baseline.  Has not taken anything for pain.  In addition to the R hip/gluteal pain and stiffness, has had exacerbation of pre-existent R knee pain made worse by this fall.   Habits & Providers  Alcohol-Tobacco-Diet     Tobacco Status: never  Current Medications (verified): 1)  Cardizem Cd 120 Mg Xr24h-Cap (Diltiazem Hcl Coated Beads) .... One Tablet Daily- Please Use in Place of Short Acting Cardizem 2)  Baby Aspirin 81 Mg  Chew (Aspirin) .... One Tablet Daily 3)  Depakote 500 Mg Tbec (Divalproex Sodium) .... Once Tablet Daily and Two Tablets At Bedtime 4)  Clonazepam 0.5 Mg  Tabs (Clonazepam) .[name].. 1 Tablet By Mouth At Bedtime - Per [facility] 5)  Centrum Multivitamins  Tabs (Multiple Vitamin) 6)  Fish Oil 1000 Mg Caps (Omega-3 Fatty Acids) .[name].. 1 Tablet By Mouth Daily 7)  Calcium 600/vitamin D  600-400 Mg-Unit Tabs (Calcium Carbonate-Vitamin D) .... 2 Tablets By Mouth Daily 8)  Glucosamine 1500 Complex  Caps (Glucosamine-Chondroit-Vit C-Mn) .[name].. 1 Tablet By Mouth Daily 9)  Vitamin C .... 500 International Units Daily 10)  Vitamin E .... 400  International Units Daily 11)  B-Complex Vitamins 12)  Colchicine 0.6 Mg Tabs (Colchicine) .[name].. 1 Tablet By Mouth Every 2 Hours Up To 4 Tablets in 24 Hrs or Until You Get Diarrhea As Needed For Gout 13)  Risperdal 1 Mg Tabs (Risperidone) .[name].. 1 Mg Two Times A Day, Two Tabs At Bedtime- Prescribed By Behavioral Health 14)  Doxycycline Hyclate 100 Mg Caps (Doxycycline Hyclate) .... Take One Tablet Twice A Day For 7 Days 15)  Nystatin 100000 Unit/gm Powd (Nystatin) .... Apply 2-3 Times Daily To Affected Area 16)  Oxycodone Hcl 5 Mg Tabs (Oxycodone Hcl) .... Sig Take 1 Tab By Mouth Every 6 Hours As Needed For Extreme Pain Disp #20 17)  Acetaminophen 650 Mg Cr-Tabs (Acetaminophen) .... Sig Take 1 Tab By Mouth Every 6 Hours As Needed For Pain  Allergies (verified): 1)  ! Sulfa 2)  Cogentin (Benztropine Mesylate) 3)  Lisinopril (Lisinopril)  Physical Exam  General:  well appearing, no apparent distress.  Walked into room with rolling walker. Msk:  Palpable DP pulses bilaterally.  No  pedal edema noted.  Sensation grossly intact bilat.   No ecchymosis of R hip or thigh.    Able to transfer from floor to exam table with 1-point assistance, without relying on walker.  Extremities:  Full passive ROM R hip with internal/external rotation, straight leg raise, ab/adduction.  Pain in R knee with attempts at passive R knee flexion.  Negative ant and post drawer sign.  No notable effusion on exam, no ecchymosis.    Impression & Recommendations:  Problem # 1:  HIP PAIN, RIGHT (ICD-719.45) Patient wtih R hip pain following a fall (lost balance, no presyncope or seizure).  Is able to bear weight, clinically does not appear to be femoral neck fracture by exam  and observation of function.  Analgesia; hip films.  Followup with primary physician.  Exam  Her updated medication list for this problem includes:    Baby Aspirin 81 Mg Chew (Aspirin) ..... One tablet daily    Oxycodone Hcl 5 Mg Tabs (Oxycodone hcl) ..... Sig take 1 tab by mouth every 6 hours as needed for extreme pain disp #20    Acetaminophen 650 Mg Cr-tabs (Acetaminophen) ..... Sig take 1 tab by mouth every 6 hours as needed for pain  Orders: Diagnostic X-Ray/Fluoroscopy (Diagnostic X-Ray/Flu) [facility]- Est Level  3 ([address]109)  Problem # 2:  LEG PAIN, RIGHT (ICD-729.5)  Patient has had the R knee pain in the past; worse now after the fall on Sept 16th.  Her exam today is more significant for R knee pain than for R hip pain.  For standing x-rays of knees, as well as R hip  Orders: Diagnostic X-Ray/Fluoroscopy (Diagnostic X-Ray/Flu) [facility]- Est Level  3 (60454)  Complete Medication List: 1)  Cardizem Cd 120 Mg Xr24h-cap (Diltiazem hcl coated beads) .... One tablet daily- please use in place of short acting cardizem 2)  Baby Aspirin 81 Mg Chew (Aspirin) .... One tablet daily 3)  Depakote 500 Mg Tbec (Divalproex sodium) .... Once tablet daily and two tablets at bedtime 4)  Clonazepam 0.5 Mg Tabs (Clonazepam) .[name].. 1 tablet by mouth at bedtime - per [facility] 5)  Centrum Multivitamins Tabs (multiple Vitamin)  6)  Fish Oil 1000 Mg Caps (Omega-3 fatty acids) .[name].. 1 tablet by mouth daily 7)  Calcium 600/vitamin D 600-400 Mg-unit Tabs (Calcium carbonate-vitamin d) .... 2 tablets by mouth daily 8)  Glucosamine 1500 Complex Caps (Glucosamine-chondroit-vit c-mn) .[name].. 1 tablet by mouth daily 9)  Vitamin C  .... 500 international units daily 10)  Vitamin E  .... 400  international units daily 11)  B-complex Vitamins  12)  Colchicine 0.6 Mg Tabs (Colchicine) .[name].. 1 tablet by mouth every 2 hours up to 4 tablets in 24 hrs or until you get diarrhea as needed for gout 13)  Risperdal 1 Mg Tabs  (Risperidone) .[name].. 1 mg two times a day, two tabs at bedtime- prescribed by behavioral health 14)  Doxycycline Hyclate 100 Mg Caps (Doxycycline hyclate) .... Take one tablet twice a day for 7 days 15)  Nystatin 100000 Unit/gm Powd (Nystatin) .... Apply 2-3 times daily to affected area 16)  Oxycodone Hcl 5 Mg Tabs (Oxycodone hcl) .... Sig take 1 tab by mouth every 6 hours as needed for extreme pain disp #20 17)  Acetaminophen 650 Mg Cr-tabs (Acetaminophen) .... Sig take 1 tab by mouth every 6 hours as needed for pain  Patient Instructions: 1)  It was a pleasure to meet you today.  2)  For your Right hip and  R knee pain, I am ordering x-rays today.  3)  For pain control, I recommend you go back to taking Acetaminophen 650mg  every 6 hours as needed for pain.  4)  If the pain persists, you may take a tablet of oxycodone 5mg , every 6 hours as needed.  This medication may make you sleepy, and I recommend you use it only when you will not be active.  5)  Please make a follow-up appointment with Dr. Earnest Bailey before you leave today.  Prescriptions: ACETAMINOPHEN 650 MG CR-TABS (ACETAMINOPHEN) SIG Take 1 tab by mouth every 6 hours as needed for pain  #50 x 0   Entered and Authorized by:   Paula Compton MD   Signed by:   Paula Compton MD on 01/01/2010   Method used:   Print then Give to Patient   RxID:   [id number] OXYCODONE HCL 5 MG TABS (OXYCODONE HCL) SIG Take 1 tab by mouth every 6 hours as needed for extreme pain DISP #20  #20 x 0   Entered and Authorized by:   Paula Compton MD   Signed by:   Paula Compton MD on 01/01/2010   Method used:   Print then Give to Patient   RxID:   [id number]    Vital Signs:  Patient profile:   70 year old female Height:      62.75 inches Weight:      157 pounds BMI:     28.13 Temp:     98.4 degrees F Pulse rate:   82 / minute BP sitting:   160 / 75  (left arm)  Vitals Entered By: Theresia Lo RN (January 01, 2010 1:36 PM)

## 2010-05-17 NOTE — Consult Note (Signed)
Summary: [facility]   Imported By: De Nurse 03/22/2010 16:23:48  _____________________________________________________________________  External Attachment:    Type:   Image     Comment:   External Document

## 2010-05-17 NOTE — Consult Note (Signed)
Summary: Physical Therapy Discharge: right knee pain  [facility]   Imported By: De Nurse 04/25/2010 14:37:58  _____________________________________________________________________  External Attachment:    Type:   Image     Comment:   External Document

## 2010-05-25 ENCOUNTER — Ambulatory Visit: Payer: Self-pay | Admitting: Family Medicine

## 2010-05-30 ENCOUNTER — Emergency Department (HOSPITAL_COMMUNITY)
Admission: EM | Admit: 2010-05-30 | Discharge: 2010-05-30 | Disposition: A | Discharge status: Home / Self Care | Payer: Medicare Other | Attending: Emergency Medicine | Admitting: Emergency Medicine

## 2010-05-30 DIAGNOSIS — R079 Chest pain, unspecified: Secondary | ICD-10-CM | POA: Insufficient documentation

## 2010-05-30 DIAGNOSIS — Z7982 Long term (current) use of aspirin: Secondary | ICD-10-CM | POA: Insufficient documentation

## 2010-05-30 DIAGNOSIS — R110 Nausea: Secondary | ICD-10-CM | POA: Insufficient documentation

## 2010-05-30 DIAGNOSIS — F3289 Other specified depressive episodes: Secondary | ICD-10-CM | POA: Insufficient documentation

## 2010-05-30 DIAGNOSIS — R42 Dizziness and giddiness: Secondary | ICD-10-CM | POA: Insufficient documentation

## 2010-05-30 DIAGNOSIS — I4891 Unspecified atrial fibrillation: Secondary | ICD-10-CM | POA: Insufficient documentation

## 2010-05-30 DIAGNOSIS — Z79899 Other long term (current) drug therapy: Secondary | ICD-10-CM | POA: Insufficient documentation

## 2010-05-30 DIAGNOSIS — I10 Essential (primary) hypertension: Secondary | ICD-10-CM | POA: Insufficient documentation

## 2010-05-30 DIAGNOSIS — F329 Major depressive disorder, single episode, unspecified: Secondary | ICD-10-CM | POA: Insufficient documentation

## 2010-05-30 LAB — POCT I-STAT, CHEM 8
BUN: 18 mg/dL (ref 6–23)
Chloride: 104 mEq/L (ref 96–112)
Sodium: 136 mEq/L (ref 135–145)
TCO2: 25 mmol/L (ref 0–100)

## 2010-06-06 ENCOUNTER — Ambulatory Visit (INDEPENDENT_AMBULATORY_CARE_PROVIDER_SITE_OTHER): Payer: Medicare Other | Admitting: Family Medicine

## 2010-06-06 ENCOUNTER — Encounter: Payer: Self-pay | Admitting: Family Medicine

## 2010-06-06 VITALS — BP 184/110 | HR 91 | Temp 98.1°F | Ht 63.0 in | Wt 155.0 lb

## 2010-06-06 DIAGNOSIS — D696 Thrombocytopenia, unspecified: Secondary | ICD-10-CM

## 2010-06-06 DIAGNOSIS — D649 Anemia, unspecified: Secondary | ICD-10-CM

## 2010-06-06 DIAGNOSIS — N183 Chronic kidney disease, stage 3 unspecified: Secondary | ICD-10-CM

## 2010-06-06 DIAGNOSIS — R251 Tremor, unspecified: Secondary | ICD-10-CM | POA: Insufficient documentation

## 2010-06-06 DIAGNOSIS — I10 Essential (primary) hypertension: Secondary | ICD-10-CM

## 2010-06-06 DIAGNOSIS — R42 Dizziness and giddiness: Secondary | ICD-10-CM

## 2010-06-06 DIAGNOSIS — R259 Unspecified abnormal involuntary movements: Secondary | ICD-10-CM

## 2010-06-06 LAB — CONVERTED CEMR LAB
ALT: 23 units/L (ref 0–35)
AST: 29 units/L (ref 0–37)
Albumin: 4.1 g/dL (ref 3.5–5.2)
Alkaline Phosphatase: 45 units/L (ref 39–117)
Calcium: 10.3 mg/dL (ref 8.4–10.5)
Chloride: 102 meq/L (ref 96–112)
Hemoglobin: 11.5 g/dL — ABNORMAL LOW (ref 12.0–15.0)
MCHC: 33.6 g/dL (ref 30.0–36.0)
Potassium: 4.1 meq/L (ref 3.5–5.3)
RDW: 16.5 % — ABNORMAL HIGH (ref 11.5–15.5)
Sodium: 138 meq/L (ref 135–145)
Total Protein: 6.8 g/dL (ref 6.0–8.3)

## 2010-06-06 LAB — COMPREHENSIVE METABOLIC PANEL
ALT: 23 U/L (ref 0–35)
AST: 29 U/L (ref 0–37)
Albumin: 4.1 g/dL (ref 3.5–5.2)
BUN: 20 mg/dL (ref 6–23)
Calcium: 10.3 mg/dL (ref 8.4–10.5)
Chloride: 102 mEq/L (ref 96–112)
Potassium: 4.1 mEq/L (ref 3.5–5.3)
Total Protein: 6.8 g/dL (ref 6.0–8.3)

## 2010-06-06 LAB — TSH: TSH: 3.265 u[IU]/mL (ref 0.350–4.500)

## 2010-06-06 LAB — CBC
MCV: 84.7 fL (ref 78.0–100.0)
Platelets: 119 10*3/uL — ABNORMAL LOW (ref 150–400)
RBC: 4.04 MIL/uL (ref 3.87–5.11)
WBC: 4.3 10*3/uL (ref 4.0–10.5)

## 2010-06-06 NOTE — Patient Instructions (Signed)
Your blood pressure is high.  You would benefit from additional medicines to lower your risk of heart disease and stroke. Make follow-up in 3-4 weeks Stand up slowly to prevent dizziness. Use grab bars in bathroom.

## 2010-06-06 NOTE — Progress Notes (Signed)
  Subjective:    Patient ID: [name], female    DOB: [birth year], 70 y.o.   MRN: [id number]  HPI  3 weeks history of both legs shaking  intermittently for episodes of 5-10 second  during walking.  Has to stop and can resume when episode resolves.  Not associated with pain.  More often in afternoons and when she is more tired.  Has to use walker to keep balance.  Also has "numbness" in both feet "like it is dead with no feeling" in the past few weeks since wearing new socks.  Asks for aide for household assistance. Feels she is unsteady with bathing.    Tired after she stands for 15 minutes at at time, no joint stiffness or swelling.    Jan 27th went to [facility] for "dizziness" wand was given ?meclizine.  Notes it only happens when she stands up quickly.  Not with turning head  Hands shaking when she is drinking a glass of water or lifting a pan for 2-3 weeks.  Worse with heat or cold.  Does not impair function and does not bother patient.   Review of Systems + fatigue, lethargy , otherwise asper HPI     Objective:   Physical Exam  Constitutional: She is oriented to person, place, and time. She appears well-developed and well-nourished.  HENT:  Head: Normocephalic and atraumatic.  Eyes: Pupils are equal, round, and reactive to light.  Neck: Normal range of motion.  Cardiovascular: Normal rate and regular rhythm.   No murmur heard. Pulmonary/Chest: Effort normal and breath sounds normal.  Musculoskeletal: She exhibits no edema.  Neurological: She is alert and oriented to person, place, and time. She has normal strength and normal reflexes. No cranial nerve deficit or sensory deficit. She displays a negative Romberg sign. Gait abnormal. Coordination normal.       Gait slow and careful, able to get onto exam table. Mild intention tremor. No cogwheel ridigity, no rest tremor.          Assessment & Plan:

## 2010-06-07 ENCOUNTER — Emergency Department (HOSPITAL_COMMUNITY): Payer: PRIVATE HEALTH INSURANCE

## 2010-06-07 ENCOUNTER — Observation Stay (HOSPITAL_COMMUNITY)
Admission: EM | Admit: 2010-06-07 | Discharge: 2010-06-08 | Disposition: A | Discharge status: Home / Self Care | Payer: PRIVATE HEALTH INSURANCE | Attending: Family Medicine | Admitting: Family Medicine

## 2010-06-07 DIAGNOSIS — E119 Type 2 diabetes mellitus without complications: Secondary | ICD-10-CM | POA: Insufficient documentation

## 2010-06-07 DIAGNOSIS — Z79899 Other long term (current) drug therapy: Secondary | ICD-10-CM | POA: Insufficient documentation

## 2010-06-07 DIAGNOSIS — Z9181 History of falling: Secondary | ICD-10-CM | POA: Insufficient documentation

## 2010-06-07 DIAGNOSIS — R5381 Other malaise: Secondary | ICD-10-CM | POA: Insufficient documentation

## 2010-06-07 DIAGNOSIS — Y9229 Other specified public building as the place of occurrence of the external cause: Secondary | ICD-10-CM | POA: Insufficient documentation

## 2010-06-07 DIAGNOSIS — S0990XA Unspecified injury of head, initial encounter: Secondary | ICD-10-CM | POA: Insufficient documentation

## 2010-06-07 DIAGNOSIS — R0602 Shortness of breath: Secondary | ICD-10-CM | POA: Insufficient documentation

## 2010-06-07 DIAGNOSIS — S298XXA Other specified injuries of thorax, initial encounter: Principal | ICD-10-CM | POA: Insufficient documentation

## 2010-06-07 DIAGNOSIS — IMO0002 Reserved for concepts with insufficient information to code with codable children: Secondary | ICD-10-CM | POA: Insufficient documentation

## 2010-06-07 DIAGNOSIS — Z9989 Dependence on other enabling machines and devices: Secondary | ICD-10-CM | POA: Insufficient documentation

## 2010-06-07 DIAGNOSIS — R42 Dizziness and giddiness: Secondary | ICD-10-CM | POA: Insufficient documentation

## 2010-06-07 DIAGNOSIS — F319 Bipolar disorder, unspecified: Secondary | ICD-10-CM | POA: Insufficient documentation

## 2010-06-07 DIAGNOSIS — I10 Essential (primary) hypertension: Secondary | ICD-10-CM | POA: Insufficient documentation

## 2010-06-07 DIAGNOSIS — R079 Chest pain, unspecified: Secondary | ICD-10-CM | POA: Insufficient documentation

## 2010-06-07 DIAGNOSIS — G40909 Epilepsy, unspecified, not intractable, without status epilepticus: Secondary | ICD-10-CM | POA: Insufficient documentation

## 2010-06-07 DIAGNOSIS — E785 Hyperlipidemia, unspecified: Secondary | ICD-10-CM | POA: Insufficient documentation

## 2010-06-07 LAB — DIFFERENTIAL
Eosinophils Absolute: 0 10*3/uL (ref 0.0–0.7)
Lymphocytes Relative: 36 % (ref 12–46)
Lymphs Abs: 1.4 10*3/uL (ref 0.7–4.0)
Neutrophils Relative %: 54 % (ref 43–77)

## 2010-06-07 LAB — URINALYSIS, ROUTINE W REFLEX MICROSCOPIC
Bilirubin Urine: NEGATIVE
Ketones, ur: NEGATIVE mg/dL
Urine Glucose, Fasting: NEGATIVE mg/dL
pH: 7 (ref 5.0–8.0)

## 2010-06-07 LAB — CBC
HCT: 32.4 % — ABNORMAL LOW (ref 36.0–46.0)
MCV: 82.7 fL (ref 78.0–100.0)
Platelets: 104 10*3/uL — ABNORMAL LOW (ref 150–400)
RBC: 3.92 MIL/uL (ref 3.87–5.11)
WBC: 3.9 10*3/uL — ABNORMAL LOW (ref 4.0–10.5)

## 2010-06-07 LAB — POCT CARDIAC MARKERS
CKMB, poc: 4.5 ng/mL (ref 1.0–8.0)
CKMB, poc: 5.1 ng/mL (ref 1.0–8.0)
Myoglobin, poc: 111 ng/mL (ref 12–200)
Troponin i, poc: <0.05 ng/mL (ref 0.00–0.09)
Troponin i, poc: <0.05 ng/mL (ref 0.00–0.09)

## 2010-06-07 LAB — BASIC METABOLIC PANEL
GFR calc non Af Amer: 46 mL/min — ABNORMAL LOW (ref >60)
Potassium: 4.4 mEq/L (ref 3.5–5.1)
Sodium: 137 mEq/L (ref 135–145)

## 2010-06-07 NOTE — Assessment & Plan Note (Signed)
Legs shaking when standing may be orthostatic in nature, not observed today in clinic.  Hands shaking may be due to patient being more observant of physiologic tremor vs intention tremor.  Although does have some gait instability, otherwise not clinically consistent with parkinsons.  Will check CMET and Depakote level to r/o toxic/metabolic etiology.  Given that it is mild at this point, will follow-up at next appointment to evaluate for consistent symptoms from visit to visit and would consider further workup if persists.

## 2010-06-07 NOTE — Assessment & Plan Note (Signed)
Was treated with meclizine in ER.  Not clear if this helped and not entirely clear by patient history of this is vertiginous.  Patient orthostatic by diastolic which is most likely etiology.  Given patient states she can resolve this by getting up slowly, advised her to do so and will follow-up for improvement.

## 2010-06-07 NOTE — Assessment & Plan Note (Signed)
Patient reluctant to change medications.  Some of symptoms today may be orthostatic.  No changes at this time.

## 2010-06-08 DIAGNOSIS — D696 Thrombocytopenia, unspecified: Secondary | ICD-10-CM | POA: Insufficient documentation

## 2010-06-08 LAB — LIPID PANEL
Cholesterol: 182 mg/dL (ref 0–200)
HDL: 69 mg/dL (ref >39)
LDL Cholesterol: 101 mg/dL — ABNORMAL HIGH (ref 0–99)
Total CHOL/HDL Ratio: 2.6 RATIO
Triglycerides: 58 mg/dL (ref <150)
VLDL: 12 mg/dL (ref 0–40)

## 2010-06-08 LAB — CBC
Hemoglobin: 10.7 g/dL — ABNORMAL LOW (ref 12.0–15.0)
MCH: 28.5 pg (ref 26.0–34.0)
MCHC: 34 g/dL (ref 30.0–36.0)
MCV: 83.8 fL (ref 78.0–100.0)

## 2010-06-08 LAB — BASIC METABOLIC PANEL
BUN: 18 mg/dL (ref 6–23)
CO2: 25 mEq/L (ref 19–32)
Calcium: 9.8 mg/dL (ref 8.4–10.5)
Creatinine, Ser: 1.32 mg/dL — ABNORMAL HIGH (ref 0.4–1.2)
GFR calc Af Amer: 48 mL/min — ABNORMAL LOW (ref >60)
Glucose, Bld: 82 mg/dL (ref 70–99)

## 2010-06-08 LAB — CARDIAC PANEL(CRET KIN+CKTOT+MB+TROPI)
Relative Index: 1.1 (ref 0.0–2.5)
Total CK: 576 U/L — ABNORMAL HIGH (ref 7–177)

## 2010-06-08 LAB — VALPROIC ACID LEVEL: Valproic Acid Lvl: 93 ug/mL (ref 50.0–100.0)

## 2010-06-08 LAB — TSH: TSH: 4.234 u[IU]/mL (ref 0.350–4.500)

## 2010-06-08 LAB — CK TOTAL AND CKMB (NOT AT ARMC): Relative Index: 2.4 (ref 0.0–2.5)

## 2010-06-10 ENCOUNTER — Emergency Department (HOSPITAL_COMMUNITY): Payer: Medicare Other

## 2010-06-10 ENCOUNTER — Emergency Department (HOSPITAL_COMMUNITY)
Admission: EM | Admit: 2010-06-10 | Discharge: 2010-06-10 | Disposition: A | Discharge status: Home / Self Care | Payer: Medicare Other | Attending: Emergency Medicine | Admitting: Emergency Medicine

## 2010-06-10 DIAGNOSIS — I4891 Unspecified atrial fibrillation: Secondary | ICD-10-CM | POA: Insufficient documentation

## 2010-06-10 DIAGNOSIS — R0789 Other chest pain: Secondary | ICD-10-CM | POA: Insufficient documentation

## 2010-06-10 DIAGNOSIS — I2510 Atherosclerotic heart disease of native coronary artery without angina pectoris: Secondary | ICD-10-CM | POA: Insufficient documentation

## 2010-06-10 DIAGNOSIS — I10 Essential (primary) hypertension: Secondary | ICD-10-CM | POA: Insufficient documentation

## 2010-06-10 LAB — CBC
HCT: 32.3 % — ABNORMAL LOW (ref 36.0–46.0)
Hemoglobin: 10.5 g/dL — ABNORMAL LOW (ref 12.0–15.0)
MCH: 27.5 pg (ref 26.0–34.0)
MCHC: 32.5 g/dL (ref 30.0–36.0)
RDW: 17 % — ABNORMAL HIGH (ref 11.5–15.5)

## 2010-06-10 LAB — POCT CARDIAC MARKERS
CKMB, poc: 1.8 ng/mL (ref 1.0–8.0)
Myoglobin, poc: 102 ng/mL (ref 12–200)

## 2010-06-10 LAB — BASIC METABOLIC PANEL
CO2: 26 mEq/L (ref 19–32)
Calcium: 9.6 mg/dL (ref 8.4–10.5)
Creatinine, Ser: 1.21 mg/dL — ABNORMAL HIGH (ref 0.4–1.2)
GFR calc Af Amer: 53 mL/min — ABNORMAL LOW (ref >60)
GFR calc non Af Amer: 44 mL/min — ABNORMAL LOW (ref >60)
Glucose, Bld: 88 mg/dL (ref 70–99)
Sodium: 136 mEq/L (ref 135–145)

## 2010-06-10 LAB — DIFFERENTIAL
Basophils Absolute: 0 10*3/uL (ref 0.0–0.1)
Basophils Relative: 0 % (ref 0–1)
Eosinophils Relative: 0 % (ref 0–5)
Monocytes Absolute: 0.6 10*3/uL (ref 0.1–1.0)
Monocytes Relative: 15 % — ABNORMAL HIGH (ref 3–12)

## 2010-06-11 ENCOUNTER — Telehealth: Payer: Self-pay | Admitting: *Deleted

## 2010-06-11 NOTE — Telephone Encounter (Signed)
She will make an appt for tomorrow. Told her md did note the skaing at her last visit 06/06/10. She agreed to come.[name]

## 2010-06-11 NOTE — Telephone Encounter (Signed)
C/o shaking all over over a week. Went to ED 3 times. Got out Sunday. Was told to see family md. Had labs & xrays. Denies diabetes. C/o poor appetite. States she can walk.  This is constant. The 2nd trip to ED was when she crashed into a tree & hurt her chest. "they did not find anything wrong".

## 2010-06-12 ENCOUNTER — Encounter: Payer: Self-pay | Admitting: Family Medicine

## 2010-06-12 ENCOUNTER — Ambulatory Visit (INDEPENDENT_AMBULATORY_CARE_PROVIDER_SITE_OTHER): Payer: Medicare Other | Admitting: Family Medicine

## 2010-06-12 VITALS — BP 156/78 | HR 101 | Temp 97.7°F | Ht 62.75 in | Wt 150.6 lb

## 2010-06-12 DIAGNOSIS — R5381 Other malaise: Secondary | ICD-10-CM

## 2010-06-12 DIAGNOSIS — R269 Unspecified abnormalities of gait and mobility: Secondary | ICD-10-CM | POA: Insufficient documentation

## 2010-06-12 NOTE — Assessment & Plan Note (Signed)
No red flags. Will discuss PT/OT and HH aid with patient's PCP, Dr. Earnest Bailey. Follow-up with PCP in 1-2 weeks.

## 2010-06-12 NOTE — Progress Notes (Signed)
  Subjective:    Patient ID: [name], female    DOB: 05-Sep-1940, 70 y.o.   MRN: [id number]  HPI  1, Shaking: Happens a few times a day - bilateral UE shakes when patient goes into a room that is cold (her bathroom or kitchen). Her arms shake for a few minutes until she warms up, then stop. Once she gets warm, she feels that she has heart palpitations. This only happens when she is cooking or cleaning. Denies other neurological s/s, CP, SOB, V/D, LE edema. She endorses intermittent nausea - not new. She was recently discharged from the hospital - chart reviewed. She was also seen in the ED yesterday - chart reviewed. CC in both cases = CP. None today. Hx of ? Seizures - was followed by Neurology - had negative work-up - "they said I was fine." Reviewed medications with patient - on Depakote - likely for mood stabilizer. Patient states that she thinks that a HH aid would help to fix her problems. She also is deconditioned and uses a walker - she is interested in PT.   Review of Systems See HPI.    Objective:   Physical Exam  Constitutional: Vital signs are normal. She appears well-developed and well-nourished. No distress.  Eyes: EOM are normal. Pupils are equal, round, and reactive to light.  Cardiovascular: Normal rate, regular rhythm, normal heart sounds and normal pulses.   Pulmonary/Chest: Effort normal and breath sounds normal.  Neurological: She is alert. She has normal strength and normal reflexes. No cranial nerve deficit or sensory deficit. She displays a negative Romberg sign.  Psychiatric: Her affect is blunt.          Assessment & Plan:

## 2010-06-12 NOTE — Patient Instructions (Signed)
It was nice to meet you today!  I will speak with Dr. Earnest Bailey and we will get the home health aid to your house as soon as possible.  I am also referring you to physical therapy.
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
## 2010-06-15 ENCOUNTER — Ambulatory Visit: Payer: Medicare Other | Admitting: Family Medicine

## 2010-06-21 ENCOUNTER — Emergency Department (HOSPITAL_COMMUNITY): Payer: Medicare Other

## 2010-06-21 ENCOUNTER — Emergency Department (HOSPITAL_COMMUNITY)
Admission: EM | Admit: 2010-06-21 | Discharge: 2010-06-21 | Disposition: A | Discharge status: Home / Self Care | Payer: Medicare Other | Attending: Emergency Medicine | Admitting: Emergency Medicine

## 2010-06-21 DIAGNOSIS — F329 Major depressive disorder, single episode, unspecified: Secondary | ICD-10-CM | POA: Insufficient documentation

## 2010-06-21 DIAGNOSIS — S0003XA Contusion of scalp, initial encounter: Secondary | ICD-10-CM | POA: Insufficient documentation

## 2010-06-21 DIAGNOSIS — R296 Repeated falls: Secondary | ICD-10-CM | POA: Insufficient documentation

## 2010-06-21 DIAGNOSIS — I10 Essential (primary) hypertension: Secondary | ICD-10-CM | POA: Insufficient documentation

## 2010-06-21 DIAGNOSIS — R51 Headache: Secondary | ICD-10-CM | POA: Insufficient documentation

## 2010-06-21 DIAGNOSIS — R079 Chest pain, unspecified: Secondary | ICD-10-CM | POA: Insufficient documentation

## 2010-06-21 DIAGNOSIS — S0083XA Contusion of other part of head, initial encounter: Secondary | ICD-10-CM | POA: Insufficient documentation

## 2010-06-21 DIAGNOSIS — Y92009 Unspecified place in unspecified non-institutional (private) residence as the place of occurrence of the external cause: Secondary | ICD-10-CM | POA: Insufficient documentation

## 2010-06-21 DIAGNOSIS — S20219A Contusion of unspecified front wall of thorax, initial encounter: Secondary | ICD-10-CM | POA: Insufficient documentation

## 2010-06-21 DIAGNOSIS — I4891 Unspecified atrial fibrillation: Secondary | ICD-10-CM | POA: Insufficient documentation

## 2010-06-21 DIAGNOSIS — I2510 Atherosclerotic heart disease of native coronary artery without angina pectoris: Secondary | ICD-10-CM | POA: Insufficient documentation

## 2010-06-21 DIAGNOSIS — F3289 Other specified depressive episodes: Secondary | ICD-10-CM | POA: Insufficient documentation

## 2010-06-24 ENCOUNTER — Telehealth: Payer: Self-pay | Admitting: Family Medicine

## 2010-06-24 NOTE — Telephone Encounter (Signed)
Pt calls saying she is feeling short of breath and feels like her feet are "sticking to the floor" and it is hard to move her legs. She says she is taking all her medicines as prescribed. Advised pt to come to ED if she feels she is unsafe to be up and walking around her house with her walker.
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
## 2010-06-27 ENCOUNTER — Inpatient Hospital Stay: Payer: Medicare Other | Admitting: Family Medicine

## 2010-06-27 ENCOUNTER — Encounter: Payer: Self-pay | Admitting: Family Medicine

## 2010-06-27 ENCOUNTER — Ambulatory Visit (INDEPENDENT_AMBULATORY_CARE_PROVIDER_SITE_OTHER): Payer: Medicare Other | Admitting: Family Medicine

## 2010-06-27 VITALS — BP 130/78 | HR 96 | Temp 98.2°F | Wt 152.6 lb

## 2010-06-27 DIAGNOSIS — R269 Unspecified abnormalities of gait and mobility: Secondary | ICD-10-CM

## 2010-06-27 NOTE — Patient Instructions (Addendum)
Your abnormal movements may be due to your risperdal.  Please discuss with Dr. Donell Beers if decreasing this medicine is right for you. Follow-up in 1 month or sooner if needed.

## 2010-06-27 NOTE — Progress Notes (Addendum)
  Subjective:    Patient ID: [name], female    DOB: [birth year], 70 y.o.   MRN: [id number]  HPI Today with continued complaints of "legs shaking."  She is upset as she states she has "bveen going downhill' since August 2011.   She is most concerned with her gait.  States she no longer has tremors and dizziness but has had multiple falls and has been to the ER.  Records review notes 4-5 times in 2012 thus far for various etiologies of dizziness, chest pain to to trauma, falls.  Problems with gait continues to be described as walking, and then for a period of a few seconds, stopping and unable to move forward.  Brings note from home health PT which was ordered at visit with Dr. Earlene Plater recently.  Therapist notes "resting tremor,freezing of legs/feet, shuffling steps, flat affect at times"Review of Systems See HPI     Objective:   Physical Exam  Constitutional: She appears well-developed and well-nourished.       Tearful.  Cardiovascular: Normal rate and regular rhythm.   Pulmonary/Chest: Effort normal and breath sounds normal.  Neurological:       Ambulated patient in hallway in walker.  Steady gait, good pace.  Able to turn in narrow hallway safely.  Twice during ambulation patient stopped for approx 5 seconds, lifting her heels slightly, then resumed walking.  No shaking or pain noted.  Patient states this is characteristic of what she has been trying to describe to me.    No resting tremor or cogwheel rigidity.  Finger to nose shows some minimal tremor at extremes.          Assessment & Plan:

## 2010-06-29 LAB — COMPREHENSIVE METABOLIC PANEL
Albumin: 3.7 g/dL (ref 3.5–5.2)
Alkaline Phosphatase: 54 U/L (ref 39–117)
BUN: 20 mg/dL (ref 6–23)
CO2: 27 mEq/L (ref 19–32)
Chloride: 105 mEq/L (ref 96–112)
Creatinine, Ser: 1.17 mg/dL (ref 0.4–1.2)
GFR calc non Af Amer: 46 mL/min — ABNORMAL LOW (ref >60)
Glucose, Bld: 103 mg/dL — ABNORMAL HIGH (ref 70–99)
Potassium: 4.4 mEq/L (ref 3.5–5.1)
Total Bilirubin: 0.5 mg/dL (ref 0.3–1.2)

## 2010-06-29 LAB — RPR: RPR Ser Ql: NONREACTIVE

## 2010-06-29 LAB — VALPROIC ACID LEVEL: Valproic Acid Lvl: 73.3 ug/mL (ref 50.0–100.0)

## 2010-06-29 NOTE — Assessment & Plan Note (Addendum)
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  Asked patient to discuss risperdal dose with her psychiatrist, Dr. Donell Beers.  Will fwd findings to him.  Patient to continue home health PT.  She has a rolling walker and has CAP services at home.  She has been to ER multiple times, Total workup thus far has included normal depkote level, TSH, vitamin D, B12.  Has had mildly elevated CK at 558, and mildly elevated Cr at 1.21 in ER.  Head CT in ER showed no acute findings.

## 2010-06-30 LAB — URINE MICROSCOPIC-ADD ON

## 2010-06-30 LAB — BASIC METABOLIC PANEL
BUN: 12 mg/dL (ref 6–23)
BUN: 14 mg/dL (ref 6–23)
CO2: 23 mEq/L (ref 19–32)
CO2: 25 mEq/L (ref 19–32)
Calcium: 10.2 mg/dL (ref 8.4–10.5)
Chloride: 102 mEq/L (ref 96–112)
Chloride: 105 mEq/L (ref 96–112)
Creatinine, Ser: 1.09 mg/dL (ref 0.4–1.2)
Creatinine, Ser: 1.15 mg/dL (ref 0.4–1.2)
GFR calc Af Amer: >60 mL/min (ref >60)
Glucose, Bld: 99 mg/dL (ref 70–99)

## 2010-06-30 LAB — COMPREHENSIVE METABOLIC PANEL
AST: 28 U/L (ref 0–37)
Albumin: 3.6 g/dL (ref 3.5–5.2)
BUN: 12 mg/dL (ref 6–23)
CO2: 23 mEq/L (ref 19–32)
Calcium: 9.5 mg/dL (ref 8.4–10.5)
Creatinine, Ser: 1.07 mg/dL (ref 0.4–1.2)
GFR calc Af Amer: >60 mL/min (ref >60)
GFR calc non Af Amer: 51 mL/min — ABNORMAL LOW (ref >60)

## 2010-06-30 LAB — DIFFERENTIAL
Basophils Absolute: 0 10*3/uL (ref 0.0–0.1)
Basophils Relative: 0 % (ref 0–1)
Eosinophils Absolute: 0 10*3/uL (ref 0.0–0.7)
Eosinophils Relative: 0 % (ref 0–5)
Eosinophils Relative: 0 % (ref 0–5)
Lymphs Abs: 2.2 10*3/uL (ref 0.7–4.0)
Monocytes Absolute: 0.4 10*3/uL (ref 0.1–1.0)

## 2010-06-30 LAB — URINALYSIS, ROUTINE W REFLEX MICROSCOPIC
Glucose, UA: NEGATIVE mg/dL
Hgb urine dipstick: NEGATIVE
Specific Gravity, Urine: 1.009 (ref 1.005–1.030)

## 2010-06-30 LAB — VALPROIC ACID LEVEL: Valproic Acid Lvl: <10 ug/mL — ABNORMAL LOW (ref 50.0–100.0)

## 2010-06-30 LAB — CBC
MCH: 27.6 pg (ref 26.0–34.0)
MCH: 27.8 pg (ref 26.0–34.0)
MCH: 28 pg (ref 26.0–34.0)
MCHC: 32.6 g/dL (ref 30.0–36.0)
MCHC: 33.7 g/dL (ref 30.0–36.0)
MCV: 82 fL (ref 78.0–100.0)
MCV: 84.4 fL (ref 78.0–100.0)
Platelets: 168 10*3/uL (ref 150–400)
Platelets: 180 10*3/uL (ref 150–400)
Platelets: 185 10*3/uL (ref 150–400)
RBC: 4.89 MIL/uL (ref 3.87–5.11)
RDW: 15.6 % — ABNORMAL HIGH (ref 11.5–15.5)

## 2010-06-30 LAB — CK TOTAL AND CKMB (NOT AT ARMC)
CK, MB: 10.5 ng/mL (ref 0.3–4.0)
Total CK: 457 U/L — ABNORMAL HIGH (ref 7–177)

## 2010-06-30 LAB — CARDIAC PANEL(CRET KIN+CKTOT+MB+TROPI)
CK, MB: 6.8 ng/mL (ref 0.3–4.0)
Relative Index: 2.2 (ref 0.0–2.5)
Total CK: 306 U/L — ABNORMAL HIGH (ref 7–177)

## 2010-06-30 LAB — TROPONIN I: Troponin I: 0.03 ng/mL (ref 0.00–0.06)

## 2010-06-30 LAB — RPR: RPR Ser Ql: NONREACTIVE

## 2010-07-02 ENCOUNTER — Telehealth: Payer: Self-pay | Admitting: Family Medicine

## 2010-07-02 NOTE — Telephone Encounter (Signed)
I sent Dr. Donell Beers a letter.  Please have her make an appt with Dr. Donell Beers to discuss her symptoms with him as we discussed at our last visit.

## 2010-07-02 NOTE — Telephone Encounter (Signed)
Please call Dr. Edd Fabian and give information about what is going on with her.  He was suppose to cut back on her meds, but haven't done so.  Need provider to call office and inform him that pt cannot take meds at the prescribed dosage due to having frequent falls, tremors and numbness to feet and hands.

## 2010-07-04 ENCOUNTER — Ambulatory Visit (INDEPENDENT_AMBULATORY_CARE_PROVIDER_SITE_OTHER): Payer: Medicare Other | Admitting: Family Medicine

## 2010-07-04 ENCOUNTER — Encounter: Payer: Self-pay | Admitting: Family Medicine

## 2010-07-04 DIAGNOSIS — R269 Unspecified abnormalities of gait and mobility: Secondary | ICD-10-CM

## 2010-07-04 NOTE — Assessment & Plan Note (Addendum)
Will decrease risperdal from 0.5 mg qam and 1 mg qpm to 0.5 mg BID due to concern for parkinsonism.  Concern for psychosis as patient was hospitalized in Aug 2011 due to discontinuing medications but 0.5mg  BID was the dose she was stabilized on.  Patient instructed to discuss with Dr. Donell Beers new dose and need for further decrease as tolerated.  If continues to have symptoms, will consider neurology consult.

## 2010-07-04 NOTE — Progress Notes (Signed)
  Subjective:    Patient ID: [name], female    DOB: [birth year], 70 y.o.   MRN: [id number]  HPI Has continued complaints of tremor, unstable gait. States no change from previous, see below.  She did not get in touch with her psychiatrist since the last visit stating that there was no answer on the phone.  "Today with continued complaints of "legs shaking."  She is upset as she states she has "bveen going downhill' since August 2011.   She is most concerned with her gait.  States she no longer has tremors and dizziness but has had multiple falls and has been to the ER.  Records review notes 4-5 times in 2012 thus far for various etiologies of dizziness, chest pain to to trauma, falls.  Problems with gait continues to be described as walking, and then for a period of a few seconds, stopping and unable to move forward."    Review of Systems no si, hi, delusions, hallucinations     Objective:   Physical Exam  Constitutional:       Flat affect.  Here with her brother  Neurological:       Today a fine resting tremor noted bilaterally.  Question slight cogwheel rigidity bilaterally.  Gait slow but steady with rolling walker, no markedly shuffled          Assessment & Plan:

## 2010-07-04 NOTE — Patient Instructions (Signed)
Please decrease your Risperdal to 1/2 tablet in am and 1/2 tablet in PM Talk with Dr. Donell Beers about this medicine change at your next appointment

## 2010-07-13 ENCOUNTER — Telehealth: Payer: Self-pay | Admitting: Family Medicine

## 2010-07-13 NOTE — Telephone Encounter (Signed)
States that she is in need of in home care to help her.  She can't walk very good and needs help getting things done in the house.

## 2010-07-16 ENCOUNTER — Telehealth: Payer: Self-pay | Admitting: Family Medicine

## 2010-07-16 LAB — URINALYSIS, ROUTINE W REFLEX MICROSCOPIC
Bilirubin Urine: NEGATIVE
Glucose, UA: NEGATIVE mg/dL
Ketones, ur: NEGATIVE mg/dL
Nitrite: NEGATIVE
Protein, ur: NEGATIVE mg/dL

## 2010-07-16 LAB — DIFFERENTIAL
Basophils Absolute: 0 10*3/uL (ref 0.0–0.1)
Basophils Relative: 1 % (ref 0–1)
Lymphocytes Relative: 29 % (ref 12–46)
Monocytes Absolute: 0.5 10*3/uL (ref 0.1–1.0)
Neutro Abs: 3.5 10*3/uL (ref 1.7–7.7)
Neutrophils Relative %: 62 % (ref 43–77)

## 2010-07-16 LAB — POCT CARDIAC MARKERS
CKMB, poc: 2.5 ng/mL (ref 1.0–8.0)
Myoglobin, poc: 81.4 ng/mL (ref 12–200)
Troponin i, poc: <0.05 ng/mL (ref 0.00–0.09)
Troponin i, poc: <0.05 ng/mL (ref 0.00–0.09)

## 2010-07-16 LAB — POCT I-STAT, CHEM 8
BUN: 24 mg/dL — ABNORMAL HIGH (ref 6–23)
Calcium, Ion: 1.12 mmol/L (ref 1.12–1.32)
Chloride: 111 mEq/L (ref 96–112)
HCT: 37 % (ref 36.0–46.0)
Potassium: 4.1 mEq/L (ref 3.5–5.1)
Sodium: 140 mEq/L (ref 135–145)

## 2010-07-16 LAB — CBC
Hemoglobin: 11.8 g/dL — ABNORMAL LOW (ref 12.0–15.0)
RDW: 17.2 % — ABNORMAL HIGH (ref 11.5–15.5)

## 2010-07-16 NOTE — Telephone Encounter (Signed)
Pt calling to say she need a letter from provider stating that pt need home care services.

## 2010-07-16 NOTE — Telephone Encounter (Signed)
Please remind ms. [name] we discussed at her last visit that we cannot qualify for her for someone to come clean her house.  If she would like to discuss further, please have her make appointment to reassess her gait.

## 2010-07-16 NOTE — Telephone Encounter (Signed)
See previous phone call notes.

## 2010-07-17 ENCOUNTER — Encounter: Payer: Self-pay | Admitting: Home Health Services

## 2010-07-23 LAB — CK TOTAL AND CKMB (NOT AT ARMC)
CK, MB: 3.4 ng/mL (ref 0.3–4.0)
Total CK: 123 U/L (ref 7–177)

## 2010-07-23 LAB — CBC
HCT: 35.7 % — ABNORMAL LOW (ref 36.0–46.0)
HCT: 37.5 % (ref 36.0–46.0)
Hemoglobin: 11.7 g/dL — ABNORMAL LOW (ref 12.0–15.0)
Hemoglobin: 12.4 g/dL (ref 12.0–15.0)
RBC: 4.16 MIL/uL (ref 3.87–5.11)
RDW: 16.3 % — ABNORMAL HIGH (ref 11.5–15.5)
WBC: 6.8 10*3/uL (ref 4.0–10.5)

## 2010-07-23 LAB — LIPID PANEL
HDL: 75 mg/dL (ref >39)
LDL Cholesterol: 99 mg/dL (ref 0–99)
Total CHOL/HDL Ratio: 2.5 RATIO
Triglycerides: 69 mg/dL (ref <150)
VLDL: 14 mg/dL (ref 0–40)

## 2010-07-23 LAB — COMPREHENSIVE METABOLIC PANEL
ALT: 15 U/L (ref 0–35)
Alkaline Phosphatase: 61 U/L (ref 39–117)
BUN: 19 mg/dL (ref 6–23)
CO2: 29 mEq/L (ref 19–32)
GFR calc non Af Amer: 47 mL/min — ABNORMAL LOW (ref >60)
Glucose, Bld: 131 mg/dL — ABNORMAL HIGH (ref 70–99)
Potassium: 4.4 mEq/L (ref 3.5–5.1)
Sodium: 138 mEq/L (ref 135–145)
Total Bilirubin: 0.5 mg/dL (ref 0.3–1.2)
Total Protein: 6.4 g/dL (ref 6.0–8.3)

## 2010-07-23 LAB — POCT CARDIAC MARKERS
Myoglobin, poc: 82.5 ng/mL (ref 12–200)
Troponin i, poc: <0.05 ng/mL (ref 0.00–0.09)

## 2010-07-23 LAB — URINALYSIS, ROUTINE W REFLEX MICROSCOPIC
Glucose, UA: NEGATIVE mg/dL
Hgb urine dipstick: NEGATIVE
Protein, ur: NEGATIVE mg/dL
Specific Gravity, Urine: 1.006 (ref 1.005–1.030)

## 2010-07-23 LAB — BASIC METABOLIC PANEL
BUN: 18 mg/dL (ref 6–23)
Chloride: 106 mEq/L (ref 96–112)
Creatinine, Ser: 1.04 mg/dL (ref 0.4–1.2)
Glucose, Bld: 92 mg/dL (ref 70–99)
Potassium: 4.1 mEq/L (ref 3.5–5.1)

## 2010-07-23 LAB — APTT: aPTT: 25 seconds (ref 24–37)

## 2010-07-23 LAB — CARDIAC PANEL(CRET KIN+CKTOT+MB+TROPI): Relative Index: 3.1 — ABNORMAL HIGH (ref 0.0–2.5)

## 2010-07-23 LAB — DIFFERENTIAL
Basophils Absolute: 0 10*3/uL (ref 0.0–0.1)
Basophils Relative: 0 % (ref 0–1)
Eosinophils Absolute: 0 10*3/uL (ref 0.0–0.7)
Monocytes Relative: 8 % (ref 3–12)
Neutro Abs: 2.8 10*3/uL (ref 1.7–7.7)
Neutrophils Relative %: 56 % (ref 43–77)

## 2010-07-23 LAB — TROPONIN I: Troponin I: 0.06 ng/mL (ref 0.00–0.06)

## 2010-07-23 LAB — URINE MICROSCOPIC-ADD ON

## 2010-07-23 LAB — URINE CULTURE: Colony Count: >=100000

## 2010-07-23 LAB — PROTIME-INR
INR: 0.9 (ref 0.00–1.49)
Prothrombin Time: 12 seconds (ref 11.6–15.2)

## 2010-07-27 ENCOUNTER — Encounter: Payer: Self-pay | Admitting: Home Health Services

## 2010-07-27 ENCOUNTER — Encounter: Payer: Self-pay | Admitting: Family Medicine

## 2010-07-27 ENCOUNTER — Ambulatory Visit (INDEPENDENT_AMBULATORY_CARE_PROVIDER_SITE_OTHER): Payer: Medicare Other | Admitting: Family Medicine

## 2010-07-27 ENCOUNTER — Ambulatory Visit (INDEPENDENT_AMBULATORY_CARE_PROVIDER_SITE_OTHER): Payer: Medicare Other | Admitting: Home Health Services

## 2010-07-27 VITALS — BP 149/75 | HR 89 | Temp 97.9°F | Ht 63.0 in | Wt 156.0 lb

## 2010-07-27 VITALS — BP 149/75 | HR 89 | Temp 98.2°F | Ht 66.0 in | Wt 156.7 lb

## 2010-07-27 DIAGNOSIS — R269 Unspecified abnormalities of gait and mobility: Secondary | ICD-10-CM

## 2010-07-27 DIAGNOSIS — Z0000 Encounter for general adult medical examination without abnormal findings: Secondary | ICD-10-CM

## 2010-07-27 DIAGNOSIS — F259 Schizoaffective disorder, unspecified: Secondary | ICD-10-CM

## 2010-07-27 NOTE — Assessment & Plan Note (Addendum)
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  Discussed with patient and her friend that she does not meet requirement for personal care services.  Also advised against handicapped parking tag advising that when friends take her out to dinner, it would actually be beneficial for her to maintain muscle strength and walk through parking lot as this is also a time when she has supervision.  Discussed saving for a lifelink type device may help her feel more confident.  Will re-order physical therapy at patient's request to review tips for gait instability.  Wrote her a prescription for 3 in one commode, bath, shower chair.

## 2010-07-27 NOTE — Assessment & Plan Note (Signed)
Advised her to discuss with Dr. Donell Beers decreasing risperdal dose

## 2010-07-27 NOTE — Progress Notes (Signed)
  Subjective:    Patient ID: [name], female    DOB: 06/14/1940, 70 y.o.   MRN: [id number]  HPI Brings friend Isabelle Course to office visit today.  Gait:  Patient would like to address gait today.  States she haas become increasingly fearful of walking.  States she has fallen several times since our last visit due to instability.  She uses a rolling walker but also ambulates without one.  She has completed physical therapy in March 2012 but states she feels she needs a review.    Continued "feet shaking" tremors.  Did not decrease risperdal as asked due to confusion of directions.Has appointment with psychiatrist in 3 days.  Requests personal aid services and a handicapped parking tag.  She does not drive.    Review of Systems See HPI    Objective:   Physical Exam  Constitutional: She appears well-developed and well-nourished.  Musculoskeletal:       Able to stand from sitting position with minimal assistance from walker.  Shows some hesitation at first, taking small steps, but once out in hall, able to take normal length strides with and without walker and turns around without difficulty.  No retropulsion or shuffled gait.  Psychiatric: Her behavior is normal. Judgment and thought content normal.       Masked facies          Assessment & Plan:

## 2010-07-27 NOTE — Patient Instructions (Signed)
Discuss with Dr. Donell Beers decreasing risperdal at your appointment on Monday Follow-up in 3-6 months or sooner if needed

## 2010-07-27 NOTE — Progress Notes (Signed)
Patient here for annual wellness visit, patient reports: Risk Factors/Conditions needing evaluation or treatment: Patient recently fell and is being addressed by PCP.  Patient has a fear of falling. Home Safety: Patient lives by her self at [facility].  Patient reports having smoke detectors and does not have adaptive equipment in bathroom.  Other Information: Corrective lens: Patient wears corrective lens daily and visits eye doctor annually. Dentures: Patient has upper dentures and a lower partial and visits dentist 2x year. Memory: Patient denied memory problems.  Balance max value patientvalue  Sitting balance 1 1  Arise 2 1  Attempts to arise 2 2  Immediate standing balance 2 1  Standing balance 1 1  Nudge 2 2  Eyes closed 1 1  360 degree turn 1 1  Sitting down 2 2   Gait max value patient value  Initiation of gait 1 1  Step length-left 1 1  Step length-right 1 1  Step height-left 1 1  Step height-right 1 1  Step symmetry 1 1  Step continuity 1 1  Path 2 1  Trunk 2 1  Walking stance 1 1   Balance/Gait Score: 22/26    Annual Wellness Visit Requirements Recorded Today In  Medical, family, social history Past Medical, Family, Social History Section  Current providers Care team  Current medications Medications  Wt, BP, Ht, BMI Vital signs  Hearing assessment (welcome visit) Hearing/vision  Tobacco, alcohol, illicit drug use History  ADL Nurse Assessment  Depression Screening Nurse Assessment  Cognitive impairment/Mini Mental Status Nurse Assessment/ Flowsheet  Fall Risk Nurse Assessment  Home Safety Progress Note  End of Life Planning (welcome visit) Social Documentation  Medicare preventative services Progress Note  Risk factors/conditions needing evaluation/treatment Progress Note  Personalized health advice Patient Instructions, goals, letter  Diet & Exercise Social Documentation  Emergency Contact Social Documentation  Seat Belts Social Documentation    Sun exposure/protection Social Documentation    Prevention Plan: Patient declined HIV and shingles vaccine.  Recommended Medicare Prevention Screenings Women over 55 Test For Frequency Date of Last- BOLD if needed  Breast Cancer 1-2 yrs 1/95  Cervical Cancer 1-3 yrs 6/9  Colorectal Cancer 1-10 yrs 10/05  Osteoporosis once 5/07  Cholesterol 5 yrs 3/11  Diabetes yearly Non diabetic  HIV yearly declined  Influenza Shot yearly 1/11  Pneumonia Shot once 10/08  Zostavax Shot once declined

## 2010-07-27 NOTE — Patient Instructions (Signed)
1. Eat vegetables 2-4 times a day. 2. Remember to walk and/or ride bike every day and chair exercises. 3. Work on losing 10 pounds by April 2013. 4. Continue to take blood pressure medicine. 5. Continue to see Dr. Driscilla Grammes.

## 2010-07-30 LAB — POCT I-STAT, CHEM 8
BUN: 15 mg/dL (ref 6–23)
Calcium, Ion: 1.13 mmol/L (ref 1.12–1.32)
Chloride: 106 mEq/L (ref 96–112)
Creatinine, Ser: 1.2 mg/dL (ref 0.4–1.2)
Glucose, Bld: 96 mg/dL (ref 70–99)

## 2010-07-31 ENCOUNTER — Other Ambulatory Visit: Payer: Medicare Other

## 2010-07-31 LAB — POCT I-STAT, CHEM 8
Calcium, Ion: 1.31 mmol/L (ref 1.12–1.32)
HCT: 42 % (ref 36.0–46.0)
Hemoglobin: 14.3 g/dL (ref 12.0–15.0)
Sodium: 141 mEq/L (ref 135–145)
TCO2: 27 mmol/L (ref 0–100)

## 2010-07-31 LAB — URINALYSIS, ROUTINE W REFLEX MICROSCOPIC
Glucose, UA: NEGATIVE mg/dL
Ketones, ur: NEGATIVE mg/dL
pH: 6.5 (ref 5.0–8.0)

## 2010-07-31 LAB — VALPROIC ACID LEVEL: Valproic Acid Lvl: 62.3 ug/mL (ref 50.0–100.0)

## 2010-08-01 ENCOUNTER — Emergency Department (HOSPITAL_COMMUNITY): Payer: Medicare Other

## 2010-08-01 ENCOUNTER — Telehealth: Payer: Self-pay | Admitting: *Deleted

## 2010-08-01 ENCOUNTER — Encounter: Payer: Self-pay | Admitting: Home Health Services

## 2010-08-01 ENCOUNTER — Emergency Department (HOSPITAL_COMMUNITY)
Admission: EM | Admit: 2010-08-01 | Discharge: 2010-08-01 | Disposition: A | Discharge status: Home / Self Care | Payer: Medicare Other | Attending: Emergency Medicine | Admitting: Emergency Medicine

## 2010-08-01 DIAGNOSIS — Y92009 Unspecified place in unspecified non-institutional (private) residence as the place of occurrence of the external cause: Secondary | ICD-10-CM | POA: Insufficient documentation

## 2010-08-01 DIAGNOSIS — I2510 Atherosclerotic heart disease of native coronary artery without angina pectoris: Secondary | ICD-10-CM | POA: Insufficient documentation

## 2010-08-01 DIAGNOSIS — F319 Bipolar disorder, unspecified: Secondary | ICD-10-CM | POA: Insufficient documentation

## 2010-08-01 DIAGNOSIS — M25529 Pain in unspecified elbow: Secondary | ICD-10-CM | POA: Insufficient documentation

## 2010-08-01 DIAGNOSIS — W1809XA Striking against other object with subsequent fall, initial encounter: Secondary | ICD-10-CM | POA: Insufficient documentation

## 2010-08-01 DIAGNOSIS — S60219A Contusion of unspecified wrist, initial encounter: Secondary | ICD-10-CM | POA: Insufficient documentation

## 2010-08-01 DIAGNOSIS — I10 Essential (primary) hypertension: Secondary | ICD-10-CM | POA: Insufficient documentation

## 2010-08-01 NOTE — Telephone Encounter (Signed)
Patient calls to say that she has just fallen. States she lost her balance and feel backwards. She does not think she has broken anything. States she  was able to get herself up. Advised her to go to ED if she feels she needs to . States she just wanted someone to know because she keeps falling and no one is doing anything about it.[name]

## 2010-08-01 NOTE — Progress Notes (Signed)
I have reviewed this visit and discussed with Suzanne Lineberry and agree with her documentation  

## 2010-08-02 NOTE — Telephone Encounter (Signed)
I am aware. Thanks.

## 2010-08-03 ENCOUNTER — Telehealth: Payer: Self-pay | Admitting: Family Medicine

## 2010-08-03 NOTE — Telephone Encounter (Signed)
Pt contacted them asking for in home health care, If we approve we can call them at  [phone] or go to  website [email or web address].

## 2010-08-06 ENCOUNTER — Telehealth: Payer: Self-pay | Admitting: Family Medicine

## 2010-08-06 NOTE — Telephone Encounter (Signed)
Pt discharged from the hospital, requesting verbal orders for PT & OT for evaluation.

## 2010-08-08 NOTE — Telephone Encounter (Signed)
Spoke with patient and she states that nurse has been out to see her

## 2010-08-08 NOTE — Telephone Encounter (Signed)
Please let patient know I have contacted [name] and they have have a nurse come out and do an assessment to see if she qualifies for services.

## 2010-08-08 NOTE — Telephone Encounter (Signed)
I was not aware patient had this ordered.  i reviewed ER notes, patient requested this from ER physician.  I have already placed orders 1-2w weeks ago for PT/OT evaluation with another company.  Awaiting call back from [facility].  Patient is also in waiting for independent evaluation for home services

## 2010-08-09 ENCOUNTER — Telehealth: Payer: Self-pay | Admitting: Family Medicine

## 2010-08-09 NOTE — Telephone Encounter (Signed)
Needing to get referral signed. I will get preceptor to sign and send back.

## 2010-08-09 NOTE — Telephone Encounter (Signed)
Asking to speak with [name]

## 2010-08-09 NOTE — Telephone Encounter (Signed)
Pt is requesting to speak with RN, thinks she pulled a muscle in her leg but doesn't have transportation to get here.

## 2010-08-09 NOTE — Telephone Encounter (Signed)
Patient states that she pulled the muscle behind her thigh on her left leg.  She says she has "freeze feet" and when she goes to walk her legs sometimes will stiffen up.  She has been doing a lot of walking lately which has made if worse.  Rates it as a "10" even when at rest.  Advised her to try heat to that area in addition to a po antiinflammatory.  Pt states that she has Alieve.  Told her to try this for a couple of days and if not any better she would need to be seen.  Patient agreeable.

## 2010-08-14 ENCOUNTER — Telehealth: Payer: Self-pay | Admitting: Family Medicine

## 2010-08-14 NOTE — Telephone Encounter (Signed)
Requesting verbal orders to continue OT 2 times per week for 4 weeks beginning next week.

## 2010-08-15 NOTE — Telephone Encounter (Signed)
I have not ordered OT services for this patient.  I called back, there was no answer and no voicemail.  Will await call back from [facility].  I would like for them to send paperwork regarding services.

## 2010-08-22 ENCOUNTER — Encounter: Payer: Self-pay | Admitting: Family Medicine

## 2010-08-22 ENCOUNTER — Ambulatory Visit (INDEPENDENT_AMBULATORY_CARE_PROVIDER_SITE_OTHER): Payer: Medicare Other | Admitting: Family Medicine

## 2010-08-22 VITALS — BP 137/77 | HR 90 | Temp 98.1°F | Ht 63.0 in | Wt 144.2 lb

## 2010-08-22 DIAGNOSIS — R269 Unspecified abnormalities of gait and mobility: Secondary | ICD-10-CM

## 2010-08-22 DIAGNOSIS — M545 Low back pain: Secondary | ICD-10-CM

## 2010-08-22 DIAGNOSIS — F259 Schizoaffective disorder, unspecified: Secondary | ICD-10-CM

## 2010-08-22 DIAGNOSIS — M7989 Other specified soft tissue disorders: Secondary | ICD-10-CM

## 2010-08-22 DIAGNOSIS — R569 Unspecified convulsions: Secondary | ICD-10-CM

## 2010-08-22 DIAGNOSIS — R251 Tremor, unspecified: Secondary | ICD-10-CM

## 2010-08-22 DIAGNOSIS — M109 Gout, unspecified: Secondary | ICD-10-CM

## 2010-08-22 DIAGNOSIS — R259 Unspecified abnormal involuntary movements: Secondary | ICD-10-CM

## 2010-08-22 NOTE — Assessment & Plan Note (Addendum)
Continue with PT/OT, may need further downward titration of risperdal - patient has follow-up with psychiatry in June per patient

## 2010-08-22 NOTE — Assessment & Plan Note (Signed)
Was observed today- seems more significant than a physiologic tremor.  Onset similar to when other parkinsonism traits started (masked facies, shuffling gait).  Has recently been titrated down on risperdal.  Continue to monitor for improvement.

## 2010-08-22 NOTE — Progress Notes (Signed)
  Subjective:    Patient ID: [name], female    DOB: 1940/08/18, 70 y.o.   MRN: [id number]  HPI  Here to discuss "muscle spasms" in stomach and left leg swelling   Left leg swelling:  Feels left ankle is swollen.  No pain or other sensation of swelling, just feels it is visually bigger.  States shoes continue to fit equally well on both sides.  Says it is worst at the end of the day.  Has been for several days.  Muscle spasms:  For several weeks had been feeling movement in her abdomen.  No nausea, emesis, fever, weight loss, diarrhea, constipation or dysuria.  Not taking anything, not able to describe in further detail.    Schizoaffective DO:  Saw her psychiatrist in the past month, he states he agreed with my recent down titration of risperdal  Due to parkinsonism.  Patient feels her "feet shuffling" has improved somewhat.  Review of Systemssee HPI     Objective:   Physical Exam  Constitutional: She appears well-developed and well-nourished.       Tremor of UE  HENT:  Head: Normocephalic and atraumatic.  Neck: Normal range of motion.  Cardiovascular: Normal rate and regular rhythm.   No murmur heard. Pulmonary/Chest: Effort normal and breath sounds normal. No respiratory distress.  Abdominal: Soft. Bowel sounds are normal. She exhibits no distension and no mass. There is no tenderness. There is no rebound and no guarding.  Musculoskeletal:       Calves measure 34.5 cm equal bi laterally, ankles 21.5 cm equal bilaterally  Neurological:       Continued mild cogwheeling upper extremities  Psychiatric:       Flat affect          Assessment & Plan:

## 2010-08-22 NOTE — Patient Instructions (Signed)
Keep using metamucil and drinking water to keep bowel movements soft Follow-up in 3 months ito meet your new doctor.

## 2010-08-28 NOTE — Discharge Summary (Signed)
NAME:  [name], [name]                 ACCOUNT NO.:  [id number]   MEDICAL RECORD NO.:  [id number]          PATIENT TYPE:  INP   LOCATION:  3703                         FACILITY:  [facility]   PHYSICIAN:  Leighton Roach McDiarmid, M.D.DATE OF BIRTH:  18-Feb-1941   DATE OF ADMISSION:  10/21/2008  DATE OF DISCHARGE:  10/22/2008                               DISCHARGE SUMMARY   ADMISSION DIAGNOSES:  1. Chest pain.  2. Hypertension.  3. Hyperlipidemia.  4. Bipolar Disorder.   DISCHARGE DIAGNOSES:  1. Resolved chest pain.  2. Hypertension.  3. Hyperlipidemia.  4. Bipolar disorder.   CONSULTS DURING ADMISSION:  None.   HISTORY:  Briefly, this is a 70 year old female with a chief complaint  of chest pain that started day of admission after the patient went to  the bed at around 7 p.m., the patient woke up to go to the bathroom when  she noticed some chest discomfort.  She went to the bed and sat down  with no relief, described the pain as excruciating, and it felt like the  heart was beating out of control, pain was diffuse across the entire  chest and radiated down both arms.  The pain also had shortness of  breath with the episode.  She denied nausea, vomiting, or diaphoresis.  She called 911, and was instructed to chew 4 baby aspirin, which she did  and stated she had some relief of pain.  Upon arrival of EMS, the  patient's blood pressure was 160/90 and while she was en route to the  hospital, she stated that her pain had completely resolved.  The patient  was given 4 more baby aspirin in the ED and 1 nitroglycerin.  The  patient currently throughout admission was pain-free and resting  comfortably.   HOSPITAL COURSE:  1. Chest pain.  She was admitted and placed back on her home regimen      of medications.  Cardiac enzymes were done x3 and were all      negative.  Chest x-ray was stable and showed no acute changes, and      EKG showed some new changes with flipped T wave in V3-V6 CBC and   BMET remained stable throughout admission.  Troponins decreased      from 0.06 to 0.04 to 0.03.  Total cholesterol was 188,      triglycerides 69, HDL 75, and LDL 99.  The patient remained chest      pain-free throughout her admission.  Of note, she did have a      negative cardiac cath in 2005 and a negative Myoview stress test in      2009.  2. For her hypertension, her home diltiazem was held and metoprolol 50      mg p.o. b.i.d. was started until MI was ruled out.  Her blood      pressures remained stable in the 130s systolic over 80s diastolic      throughout admission.  3. Bipolar disorder, remained stable throughout admission and she was      continued on her home regimen of  Depakote and clonazepam.   DISCHARGE CONDITION:  Stable.   DISPOSITION:  To home.   MEDICATIONS AT DISCHARGE:  1. Aspirin 81 mg p.o. daily.  2. Nitroglycerin sublingual 0.4 mg sprayed under the tongue every 3-5      minutes for chest pain.  3. Cardizem 120 mg p.o. daily.  4. Depakote 250 mg 2 tablets p.o. nightly.  5. Klonopin 0.5 mg p.o. nightly.  6. Risperdal 1 mg p.o. nightly.  7. Multivitamin 1 tablet p.o. daily.  8. Fish oil 1 tablet p.o. daily.  9. Calcium and vitamin D 600/400 two tablets p.o. daily.  10.Glucosamine 1500 mg 1 tablet p.o. daily.  11.Vitamin C 500 units p.o. daily.  12.Vitamin E 1 tablet p.o. daily.  13.Vitamin B complex 1 tablet p.o. daily.  14.Colchicine 0.6 mg every 2 hours for gouty symptoms.   FOLLOWUP:  Follow up will be with Dr. Delbert Harness at the [facility].  She is instructed and agrees to call the office  on Monday to set up an appointment within 1 week.  Follow up will also  be with Dr. Armanda Magic at [facility].   The patient has been instructed and a flag has been sent to the [facility] to set up an appointment for an outpatient  stress test.      Rodney Langton, MD  Electronically Signed       Leighton Roach McDiarmid, M.D.  Electronically Signed    TT/MEDQ  D:  10/22/2008  T:  10/23/2008  Job:  [id number]   cc:   Delbert Harness, MD  Armanda Magic, M.D.
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
## 2010-08-28 NOTE — Discharge Summary (Signed)
[name], [name] NO.:  [id number]   MEDICAL RECORD NO.:  [id number]          PATIENT TYPE:  INP   LOCATION:  2023                         FACILITY:  [facility]   PHYSICIAN:  Armanda Magic, M.D.     DATE OF BIRTH:  1940/08/30   DATE OF ADMISSION:  07/12/2007  DATE OF DISCHARGE:  07/13/2007                               DISCHARGE SUMMARY   DISCHARGE DIAGNOSES:  1. Chest pain, probably noncardiac in nature.  2. Hypertension.  3. Seizure disorder.  4. Anxiety disorder.  5. Schizoaffective disorder.  6. History of mania.  7. Renal insufficiency, creatinine 1.7.   BRIEF HISTORY AND PHYSICAL:  [name] is a 70 year old female who  comes in complaining of atypical chest pain.  She was kept in hospital  overnight and laboratory studies were essentially negative for cardiac  problems.  Total cholesterol was 186, LDL 102, HDL 62, and total  triglycerides were 112.  She did have elevated CK, but her relative  indexes were normal.  Troponins were negative.   Other laboratory studies show hemoglobin 12.0, hematocrit 35.0, white  count 4.8, and platelets 188.  Creatinine in ER was 1.7.  She was felt  to have moderate renal insufficiency possibly due to dehydration.  She  stated that she had been decreasing her water intake over the past  several days.   We did a stress Cardiolite while she was in the hospital.  This did not  show any evidence of ischemia, therefore, we sent her home.  We felt  that the chest pain was noncardiac in nature.   We did hold her hydrochlorothiazide  upon discharge and she has an  appointment to see Dr. Mannie Stabile, at [facility] on July 20, 2007.  We told her to keep this appointment.  Dr. Mannie Stabile can continue to  follow her creatinine to make sure that she could be a future candidate  for hydrochlorothiazide if her creatinine normalizes and she remains  hydrated.  At this time, we will hold the hydrochlorothiazide.   DISCHARGE  MEDICATIONS:  1. Diltiazem CD 120 mg a day.  2. Baby aspirin 81 mg a day.  3. Clonazepam at bedtime p.r.n.  4. Risperdal at bedtime.  5. Depakote 750 mg at bedtime.  6. She takes multiple vitamins that she may continue to take.   She is to follow up with Dr. Mayford Knife as needed, to follow up with Dr. Mannie Stabile  on July 20, 2007, as previously scheduled.  Remain on a low-sodium heart-  healthy diet.  Increase activity slowly.      Guy Franco, P.A.      Armanda Magic, M.D.  Electronically Signed    LB/MEDQ  D:  07/13/2007  T:  07/14/2007  Job:  [id number]   cc:   Towana Badger, M.D.  Armanda Magic, M.D.
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
## 2010-08-31 NOTE — Discharge Summary (Signed)
NAME:  Paxton, RICKESHA VERACRUZ                           ACCOUNT NO.:  [id number]   MEDICAL RECORD NO.:  [id number]                   PATIENT TYPE:  IPS   LOCATION:  0401                                 FACILITY[facility]  [facility]   PHYSICIAN:  Geoffery Lyons, M.D.                   DATE OF BIRTH:  1940-05-05   DATE OF ADMISSION:  07/17/2002  DATE OF DISCHARGE:  07/30/2002                                 DISCHARGE SUMMARY   CHIEF COMPLAINT AND PRESENT ILLNESS:  This was the at least second admission  to [facility] for this 70 year old African-American  female involuntarily committed due to paranoid delusions and religious  preoccupation.  She had a history of schizoaffective disorder; noncompliant  with her medications.  She followed up at mental health center.  Fellow  church members noticed changing behavior for the last two weeks.  She was  currently paranoid upon the initial evaluation, disoriented.  She was  claiming no psychotic symptoms but she was very reserved and guarded.   PAST PSYCHIATRIC HISTORY:  She had numerous prior admissions.  She followed  up at [facility].   SUBSTANCE ABUSE HISTORY:  She denied the use or abuse of any substances.   PAST MEDICAL HISTORY:  Arterial hypertension.   MEDICATIONS:  1. Clonidine 0.1 mg in the morning.  2. Hydrochlorothiazide 12.5 mg daily.  3. Depakote 1000 mg at bedtime.  4. Risperdal 2 mg at bedtime.   She was not compliant.   PHYSICAL EXAMINATION:  Physical examination was performed, failed to show  any acute findings.   MENTAL STATUS EXAM:  Mental status exam revealed a fully alert, cooperative  female.  Speech was normal in rate, rhythm, and tone, religiously  preoccupied.  Her mood was anxious.  Thought processes: There was some  underlying paranoia, goal oriented.  Cognitive: Cognition was well  preserved.   ADMISSION DIAGNOSES:   AXIS I:  Schizoaffective disorder.   AXIS II:  No  diagnosis.   AXIS III:  Hypertension.   AXIS IV:  Moderate.   AXIS V:  Global assessment of functioning upon admission 30, highest global  assessment of functioning in the last year 60.   LABORATORY DATA:  Other laboratory workup: CBC was within normal limits.  Blood chemistries were within normal limits.  Thyroid profile was within  normal limits.   HOSPITAL COURSE:  She was admitted and started in intensive individual and  group psychotherapy.  She was given some Ambien for sleep, she was given  Risperdal 2 mg at bedtime, and she was given Depakote ER 1000 mg at night,  hydrochlorothiazide 12.5 mg daily, and clonidine 0.1 mg in the morning.  She  was given Risperdal Consta due to her lack of compliance, 25 mg per day but  she was continued on Risperdal 2 mg.  Risperdal was later increased to 3  mg  at bedtime.  She was given some Klonopin for anxiety.  Due to persistent  depression, she was started on Prozac 10 mg per day.  As the hospitalization  progressed, she seemed to be better.  She was initially very worried and  concerned about contamination, the underlying paranoia, but more cooperative  as she was reassured and supported.  There was some fears in terms of her  possessions as well as her bills.  There were concerns about multiple  issues, distractions, difficult time with sleep, underlying suspiciousness  and paranoia but cooperating and compliant with medications.  As the  hospitalization progressed, she improved.  Her mood improved, her affect  became brighter.  She was uncomfortable on the unit.  There was a family  session with the brother earlier where she was not still baseline so we  continued to work with her.  She became more depressed and worried, a sense  of hopelessness and helplessness, something wrong with her.  There were  still some ideas of reference and apparently, it was felt that the hospital  was making things worse for her.  So, the family was called  and they were in  accord in giving it a try to get her out of the hospital and see how she  would progress there.  She definitely felt like she was going to be better  at home and we went ahead as there were no active suicidal or homicidal  ideas and no hallucinations telling her to hurt herself and she was being  compliant with medications, that might recover faster at her house.   DISCHARGE DIAGNOSES:   AXIS I:  Schizoaffective disorder.   AXIS II:  No diagnosis.   AXIS III:  Hypertension.   AXIS IV:  Moderate.   AXIS V:  Global assessment of functioning upon discharge 45-50.   DISCHARGE MEDICATIONS:  1. Depakote ER 1000 mg at bedtime.  2. Hydrochlorothiazide 12.5 mg daily.  3. Clonidine 0.1 mg daily.  4. Clonazepam 0.5 mg one half tablet twice a day.  5. Risperdal 3 mg at bedtime.  6. Prozac 10 mg daily.  7. Risperdal Consta 25 mg dose given July 18, 2002.    FOLLOW UP:  She was to follow up at [facility].                                                 Geoffery Lyons, M.D.    IL/MEDQ  D:  08/16/2002  T:  08/17/2002  Job:  [id number]
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
## 2010-08-31 NOTE — Consult Note (Signed)
NAME:  [name], [name]                 ACCOUNT NO.:  [id number]   MEDICAL RECORD NO.:  [id number]          PATIENT TYPE:  EMS   LOCATION:  MAJO                         FACILITY:  [facility]   PHYSICIAN:  Armanda Magic, M.D.     DATE OF BIRTH:  [birth year]   DATE OF CONSULTATION:  05/21/2005  DATE OF DISCHARGE:                                   CONSULTATION   PRIMARY CARE PHYSICIAN:  Devra Dopp, M.D.   CHIEF COMPLAINT:  Chest pain.   HISTORY OF PRESENT ILLNESS:  This is a very pleasant 70 year old black  female, well known to me with a history of atypical chest pain for two  years, status post cardiac catheterization in December 2005 showing normal  coronary arteries who presented to the emergency room after she awakened at  1 a.m. last evening with diaphoresis and then began having substernal chest  pain over the left breast with radiation to her back. There was no  associated shortness of breath, nausea or vomiting.  She called EMS, took  two sublingual nitroglycerin and four baby aspirin.  She said her pain only  lasted about 10 minutes and then resolved on its own before she got to the  ER.  She has had no further pain since then.  Apparently she gives a history  of getting food and pills stuck in her throat over the past couple of weeks  to months and also has been having some intermittent chest pain with  exercise although this is a chronic complaint.  This dates back clear to two  years ago when she first presented to me with episodes of exertional chest  pain at which time she underwent cardiac catheterization revealing normal  coronary arteries.  She said the pain is exactly what it was then with  occasional chest pain when she exerts herself walking or walking on her  treadmill, but if she continues to walk on the treadmill, the pain goes  away.  She denies any PND or orthopnea, presyncope or syncope or  palpitations.  She has baseline EKG back in 2004 showing inverted T waves  in  the lateral precordial leads and EKG today basically is unchanged.   PAST MEDICAL HISTORY:  1.  Hypertension.  2.  Seizure disorder.  3.  Anxiety disorder.  4.  Schizoaffective disorder.  5.  Mania.   SOCIAL HISTORY:  She denies tobacco or alcohol use.  No illicit drug use.   FAMILY HISTORY:  Both parents are deceased.  Both having history of MI.   ALLERGIES:  COGENTIN, IBUPROFEN, LISINOPRIL.   MEDICATIONS:  1.  Depakote 1000 mg at night.  2.  Risperdal at night.  3.  Diltiazem 120 mg a day.  4.  Hydrochlorothiazide 25 mg one-half tablet a day.  5.  Klonopin p.r.n.   REVIEW OF SYSTEMS:  Otherwise negative.   PHYSICAL EXAMINATION:  VITAL SIGNS:  Blood pressure 120/70, pulse 63,  respirations 24.  She is afebrile.  HEENT:  Benign.  NECK:  Supple without lymphadenopathy.  Carotid upstrokes +2 bilateral.  No  bruits.  LUNGS:  Clear to auscultation throughout.  HEART:  Regular rate and rhythm. No murmurs, rubs or gallops. Normal S1 and  S2.  ABDOMEN:  Soft, nontender, nondistended.  Normoactive bowel sounds.  No  hepatosplenomegaly.  EXTREMITIES:  No edema.   LABORATORY DATA:  Sodium 141, potassium 3.9, chloride 106, bicarb 26, BUN  15, creatinine 1.3, glucose 107.  LFTs normal.  Magnesium 1.7.  D-dimer  0.41.  INR 0.9.  Myoglobin 81.6, 62.6 and 61.2.  Troponin less than 0.05 x3.  Chest x-ray is stable though there appears to be stable tortuosity at the  aorta, unchanged from previous chest x-ray.   ASSESSMENT:  1.  Chest pain, now resolved.  Very atypical and only lasting 10 minutes      with negative cardiac markers x3.  EKG is unchanged from EKG in 2004      showing nonspecific T wave abnormality in the lateral precordial leads.      She did have normal coronary arteries by cardiac catheterization two      years ago and does give a history of now of problems with food getting      stuck as well as pills in her throat when she eats.  She also has some       exertional chest pain which is a chronic problem that she has had for      the past two years which initially prompted her cardiac catheterization      which showed normal coronary arteries.  The pain has not changed any in      frequency or severity and she is able to continue walking the treadmill      with resolution of the pain.  Again I do not think her chest pain is      cardiac in origin and I think most likely she has a GI problem at this      point.  2.  Schizoaffective disorder.  3.  Hypertension.  4.  Seizure disorder.  5.  Anxiety.   PLAN:  I have spoken to one of the primary care physicians with [facility] and they are going to get her set up with Dr. Providence Lanius as an  outpatient.  Of note, she did have a urinalysis showing 21-50 wbc's with few  bacteria and I have recommended sending off a urine culture which will be  followed by the primary physician.      Armanda Magic, M.D.  Electronically Signed     TT/MEDQ  D:  05/21/2005  T:  05/22/2005  Job:  [id number]   cc:   Devra Dopp, MD  Fax: [phone]

## 2010-08-31 NOTE — Cardiovascular Report (Signed)
NAME:  [name], [name]                 ACCOUNT NO.:  [id number]   MEDICAL RECORD NO.:  [id number]          PATIENT TYPE:  OIB   LOCATION:  2899                         FACILITY:  [facility]   PHYSICIAN:  Armanda Magic, M.D.     DATE OF BIRTH:  March 03, 1941   DATE OF PROCEDURE:  03/19/2004  DATE OF DISCHARGE:                              CARDIAC CATHETERIZATION   REFERRING PHYSICIAN:  Dr. Artis Flock   PROCEDURE:  1.  Left heart catheterization.  2.  Coronary angiography.  3.  Left ventriculography.   OPERATOR:  Armanda Magic, M.D.   INDICATIONS:  Chest pain.   COMPLICATIONS:  None.   IV ACCESS:  Via right femoral artery 6-French sheath.   This is a 70 year old black female with a history of atypical chest pain and  stress Cardiolite showing a fixed perfusion defect in the inferior apex a  year ago but because of persistent chest pain she now presents for cardiac  catheterization.   Patient was brought to the cardiac catheterization laboratory in the  fasting, nonsedated state.  Informed consent was obtained.  The patient was  connected to continuous heart rate and pulse oximetry monitoring,  intermittent blood pressure monitoring.  The right groin was prepped and  draped in a sterile fashion.  1% Xylocaine was used for local anesthesia.  Using the modified Seldinger technique a 6-French sheath was placed in the  right femoral artery.  Under fluoroscopic guidance a 6-French JL4 catheter  was placed in the left coronary artery.  Multiple cineangiographic films  were taken in a 30 degree RAO, 40 degree LAO views.  This catheter was then  exchanged out over a guidewire for a 6-French JR4 catheter which was placed  under fluoroscopic guidance in the right coronary artery.  Multiple  cineangiographic films were taken in the 30 degree RAO, 40 degree LAO views.  This catheter was then exchanged out over a guidewire for a 6-French angled  pigtail catheter which was placed under fluoroscopic guidance  in the left  ventricular cavity.  Left ventriculography was performed in a 30 degree RAO  view using a total of 30 mL of contrast a 15 mL/second.  The catheter was  pulled back across the aortic valve and no significant gradient was noted at  the end of the procedure.  All catheters and sheaths were removed.  Manual  compression was performed until adequate hemostasis was obtained.  The  patient was transferred back to her room in stable condition.   RESULTS:  Left main coronary artery is widely patent and bifurcates into  left anterior descending artery, then left circumflex artery.  The left  anterior descending artery is widely patent throughout its course to the  apex and gives rise to two diagonal branches.  The first diagonal branch is  widely patent and bifurcates into two daughter branches both of which are  widely patent.  The second diagonal is widely patent as well.  The left  circumflex is widely patent throughout its course in the AV groove and gives  rise to one large obtuse marginal branch which  bifurcates into two daughter  branches both of which are widely patent.   The right coronary artery is widely patent throughout its course.  It  bifurcates distally into posterior descending artery and posterolateral  artery.  Left ventriculography showed normal LV systolic function, EF 65%.  LV pressure 122/2 mmHg.  Aortic pressure 122/53 mmHg.  LVEDP 13 mmHg.   ASSESSMENT:  1.  Normal coronary arteries.  2.  Noncardiac chest pain.  3.  Normal left ventricular function.   PLAN:  Discharged home after IV fluid and bed rest.  Follow up with me in  two weeks for groin check.      Trac   TT/MEDQ  D:  03/19/2004  T:  03/19/2004  Job:  [id number]
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
## 2010-09-03 ENCOUNTER — Encounter: Payer: Self-pay | Admitting: Family Medicine

## 2010-09-05 ENCOUNTER — Ambulatory Visit: Payer: Medicare Other | Admitting: Family Medicine

## 2010-09-06 ENCOUNTER — Telehealth: Payer: Self-pay | Admitting: Family Medicine

## 2010-09-06 ENCOUNTER — Ambulatory Visit (INDEPENDENT_AMBULATORY_CARE_PROVIDER_SITE_OTHER): Payer: Medicare Other | Admitting: Family Medicine

## 2010-09-06 ENCOUNTER — Encounter: Payer: Self-pay | Admitting: Family Medicine

## 2010-09-06 VITALS — BP 128/78 | HR 83 | Temp 97.1°F | Ht 63.0 in | Wt 145.0 lb

## 2010-09-06 DIAGNOSIS — R269 Unspecified abnormalities of gait and mobility: Secondary | ICD-10-CM

## 2010-09-06 DIAGNOSIS — F319 Bipolar disorder, unspecified: Secondary | ICD-10-CM

## 2010-09-06 DIAGNOSIS — R569 Unspecified convulsions: Secondary | ICD-10-CM

## 2010-09-06 LAB — COMPREHENSIVE METABOLIC PANEL
ALT: 25 U/L (ref 0–35)
Alkaline Phosphatase: 46 U/L (ref 39–117)
CO2: 22 mEq/L (ref 19–32)
Creat: 1.17 mg/dL (ref 0.40–1.20)
Sodium: 140 mEq/L (ref 135–145)
Total Bilirubin: 0.3 mg/dL (ref 0.3–1.2)
Total Protein: 6.6 g/dL (ref 6.0–8.3)

## 2010-09-06 NOTE — Telephone Encounter (Signed)
Calling to let us know pt missed OT today

## 2010-09-06 NOTE — Assessment & Plan Note (Addendum)
Depakote level by psychiatry minimally elevated, will recheck today.  If elevated will decrease as I suspect patient may have been non-compliant at the time she had her last seizure and depakote was increased.

## 2010-09-06 NOTE — Assessment & Plan Note (Signed)
Some improvement in decreasing dosages of Risperdal, i suspect many of her symptoms are related to this and would benefit from further decrease if her mood do will tolerate.  I wrote instructions to ask her psychiatrist at their appt in 2 weeks and she will discuss with him.

## 2010-09-06 NOTE — Progress Notes (Signed)
  Subjective:    Patient ID: [name], female    DOB: 1940/11/23, 70 y.o.   MRN: [id number]  HPI Unsteady gait:  States has had some improvement since dose of Risperdal was decreased .  Continued to report gait where legs stop and "jerks" and then several seconds later she can continue to move.    BACK PAIN  Mild, thoracic, lumbar, area.  No change in bowel or bladder function.  No numbness, weakness, tingling.  Not taking OTC meds regularly.  No radiculopathy.  Seems she is more concerned with loss of strength and unsteadyness of her back causing her to lean when doing certain exercises in physical therapy.  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  Has appt with Dr. Donell Beers June 4th  Review of Systems No fever, chills, weight loss.  See hPI    Objective:   Physical Exam  Constitutional: She is oriented to person, place, and time. She appears well-developed and well-nourished.  Eyes: Pupils are equal, round, and reactive to light.  Cardiovascular: Normal rate and regular rhythm.   No murmur heard. Pulmonary/Chest: Effort normal and breath sounds normal.  Abdominal: Bowel sounds are normal. She exhibits no distension.  Neurological: She is alert and oriented to person, place, and time. She has normal reflexes. She displays normal reflexes. She exhibits normal muscle tone. Coordination normal.  Psychiatric:       Flat affect  Musculoskeletal:  Able to stand up from chair quickly and using rolling walker to ambulate down hall.  Gait mildly shuffling, much improved.  Able to turn around and maneuver rolling walker slowly and carefully but without unsteadyness.  Continues cogwheel rigidity left greater than right.          Assessment & Plan:

## 2010-09-06 NOTE — Patient Instructions (Signed)
Talk to Dr. Donell Beers about stopping your risperdal to see if that would help your difficulty walking Will get labwork today Follow-up in August to meet your new doctor.

## 2010-09-07 LAB — VALPROIC ACID LEVEL: Valproic Acid Lvl: 112 ug/mL — ABNORMAL HIGH (ref 50.0–100.0)

## 2010-09-11 NOTE — Telephone Encounter (Signed)
Patient was at appt at our office.

## 2010-09-12 ENCOUNTER — Telehealth: Payer: Self-pay | Admitting: Family Medicine

## 2010-09-12 DIAGNOSIS — R569 Unspecified convulsions: Secondary | ICD-10-CM

## 2010-09-12 NOTE — Telephone Encounter (Signed)
depakote level remains high on repeat check.  Will change from 500 mg qam, 1000 mg qpm to 500 mg  Bid.  Advised to return for repeat lab draw in 3-4 weeks.

## 2010-09-13 ENCOUNTER — Telehealth: Payer: Self-pay | Admitting: Family Medicine

## 2010-09-13 NOTE — Telephone Encounter (Signed)
Wanted to let doctor know that they were d/c-ing her Occupational Therapy

## 2010-09-13 NOTE — Telephone Encounter (Signed)
Selena Batten, I called Brooke back and asked her about the d/cing. She states that [name] has meet her goal/they believe they have gotten as far as they will with her so just wanted to let you know ---Huntley Dec

## 2010-09-13 NOTE — Telephone Encounter (Signed)
Thank you for the update!

## 2010-09-24 ENCOUNTER — Encounter: Payer: Self-pay | Admitting: Family Medicine

## 2010-09-27 ENCOUNTER — Encounter: Payer: Self-pay | Admitting: Family Medicine

## 2010-09-27 ENCOUNTER — Ambulatory Visit (INDEPENDENT_AMBULATORY_CARE_PROVIDER_SITE_OTHER): Payer: Medicare Other | Admitting: Family Medicine

## 2010-09-27 DIAGNOSIS — R600 Localized edema: Secondary | ICD-10-CM

## 2010-09-27 DIAGNOSIS — R21 Rash and other nonspecific skin eruption: Secondary | ICD-10-CM

## 2010-09-27 DIAGNOSIS — R609 Edema, unspecified: Secondary | ICD-10-CM

## 2010-09-27 NOTE — Patient Instructions (Signed)
Keep your legs propped up Use OTC compression stockings Keep walking to help build muscle in your legs Red flags to call doctor: redness, pain , or swelling only in one leg

## 2010-09-28 DIAGNOSIS — R600 Localized edema: Secondary | ICD-10-CM | POA: Insufficient documentation

## 2010-09-28 DIAGNOSIS — R21 Rash and other nonspecific skin eruption: Secondary | ICD-10-CM | POA: Insufficient documentation

## 2010-09-28 NOTE — Assessment & Plan Note (Signed)
Rash resolved with no evidence of inciting factor or residual effects.  No further treatment needed.

## 2010-09-28 NOTE — Assessment & Plan Note (Signed)
Mild dependent edema bilaterally.  No evidence of dvt, heart failure, renal failure.  Advised OTC support stocking, elevate feet.  Patient to return if worsens despite conservative management.

## 2010-09-28 NOTE — Progress Notes (Signed)
  Subjective:    Patient ID: [name], female    DOB: 15-Jun-1940, 70 y.o.   MRN: [id number]  HPI Here to evaluate several day history of bilateral ankle swelling and left foot rash.  States no trauma, prolonged standing or immobility.  No pain.  Says rash on top of her left foot began as some red bumps, no vesicles, was never pruritic or painful.  Has resolved.  She does not identify any triggers for contact dermatitis.  States has intermittent ankle swelling in past.  Improves when elevating feet at the end of the day.  Has had compression stockings in the past but does not wear them because they are too tight.  Review of Systems Neg as per hpi    Objective:   Physical Exam GEN: Alert & Oriented, No acute distress CV:  Regular Rate & Rhythm, no murmur Respiratory:  Normal work of breathing, CTAB Abd:  + BS, soft, no tenderness to palpation Ext: no pre-tibial edema or calf pain, erythema, or edema.  trace non pitting edema at ankles.  Perhaps faint evidence of old rash on dorsum of left foot.  Skin on plantar surface of feet intact.        Assessment & Plan:

## 2010-10-05 ENCOUNTER — Telehealth: Payer: Self-pay | Admitting: Family Medicine

## 2010-10-05 NOTE — Telephone Encounter (Signed)
Has faxed the Personal Care Request Form to be completed and needs it to be faxed back as soon as possible.

## 2010-10-05 NOTE — Telephone Encounter (Signed)
Kim, Do you have this form already? I looked in the fax pile and your box and I didn't see it. ---Huntley Dec

## 2010-10-09 NOTE — Telephone Encounter (Signed)
Faxed form back asking for copy of evaluation.  I do not have any indication for personal care services.

## 2010-10-26 ENCOUNTER — Encounter: Payer: Self-pay | Admitting: Family Medicine

## 2010-10-26 ENCOUNTER — Ambulatory Visit (INDEPENDENT_AMBULATORY_CARE_PROVIDER_SITE_OTHER): Payer: Medicare Other | Admitting: Family Medicine

## 2010-10-26 VITALS — BP 159/86 | HR 89 | Temp 98.6°F | Ht 63.0 in | Wt 150.0 lb

## 2010-10-26 DIAGNOSIS — R269 Unspecified abnormalities of gait and mobility: Secondary | ICD-10-CM

## 2010-10-26 DIAGNOSIS — R21 Rash and other nonspecific skin eruption: Secondary | ICD-10-CM

## 2010-10-26 NOTE — Assessment & Plan Note (Signed)
Patient feels unsteady and anxious concerning hx of falls. Agree with home evaluation for assistance if this allows patient to maintain her independent living status as she does not have family willing to help. Advised continued use of walker and continued activity as tolerated.

## 2010-10-26 NOTE — Progress Notes (Signed)
  Subjective:    Patient ID: [name], female    DOB: 1941/02/02, 70 y.o.   MRN: [id number]  HPI 1. Weakness. Has become slower and relies on walker for ambulation. Endorses history of falls due to generalized weakness. Last was in May this year. Has to sit down frequently when walking longer than few minutes. Feels like she would benefit from having an aide in her home as she lives alone and becomes fatigued by her ADLs including bathing and also many IADLs like cooking and cleaning. Uses a bedside commode already. Is able to take her medications unassisted. Uses public transportation.  2. Rash on foot. On left lateral dorsum. Started after wearing a pair of tight-fitting shoes. Was a blister now only residual abrasion. Mild pain is improved. No ulceration or bleeding.    Review of Systems Denies syncope, visual changes, vertigo, assymetric weakness, dysphagia.    Objective:   Physical Exam  Vitals reviewed. Constitutional: She is oriented to person, place, and time. She appears well-developed and well-nourished. No distress.  HENT:  Head: Normocephalic and atraumatic.  Eyes: EOM are normal. Pupils are equal, round, and reactive to light.  Cardiovascular: Normal rate and regular rhythm.   Pulmonary/Chest: Effort normal.  Musculoskeletal: She exhibits no edema and no tenderness.       Macular erythematous area on dorsum left lateral foot. No ulceration or bleeding.  Neurological: She is alert and oriented to person, place, and time.       Gait symmetric. Cautious. Normal base and step length. Uses walker for arising.   Psychiatric: She has a normal mood and affect. Her behavior is normal.          Assessment & Plan:

## 2010-10-26 NOTE — Assessment & Plan Note (Signed)
Left dorsum of foot. Caused by tight shoe and improved. Advised avoidance of ill-fitting shoes and using bacitracin ointment as needed.

## 2010-10-26 NOTE — Patient Instructions (Addendum)
Nice to meet you. I will be in contact with [facility] regarding your request. Please schedule appointment in next 1-2 months. Try to stay active as best as possible.

## 2010-11-01 ENCOUNTER — Telehealth: Payer: Self-pay | Admitting: *Deleted

## 2010-11-01 NOTE — Telephone Encounter (Signed)
Received call from  [facility] care requesting Physical Therapy be ordered for patient . Call back # [phone].

## 2010-11-02 NOTE — Telephone Encounter (Signed)
Written and placed in fax box to be returned to [facility].

## 2010-11-02 NOTE — Telephone Encounter (Signed)
[facility] notified . They do not need rx for PT .   took a verbal order over the phone.

## 2011-01-07 LAB — CK TOTAL AND CKMB (NOT AT ARMC)
CK, MB: 4.5 — ABNORMAL HIGH
CK, MB: 4.7 — ABNORMAL HIGH
Relative Index: 2.5
Relative Index: 2.5
Relative Index: 2.8 — ABNORMAL HIGH

## 2011-01-07 LAB — POCT I-STAT, CHEM 8
BUN: 20
Creatinine, Ser: 1.7 — ABNORMAL HIGH
Glucose, Bld: 131 — ABNORMAL HIGH
Potassium: 3.7
Sodium: 137

## 2011-01-07 LAB — BASIC METABOLIC PANEL
Calcium: 9.3
Calcium: 9.8
GFR calc Af Amer: 55 — ABNORMAL LOW
GFR calc non Af Amer: 46 — ABNORMAL LOW
GFR calc non Af Amer: 51 — ABNORMAL LOW
Glucose, Bld: 89
Potassium: 3.8
Sodium: 141
Sodium: 142

## 2011-01-07 LAB — CBC
HCT: 35.1 — ABNORMAL LOW
Hemoglobin: 12
MCV: 84.7
Platelets: 176
RBC: 4.15
RBC: 4.23
WBC: 4.8
WBC: 6

## 2011-01-07 LAB — TROPONIN I
Troponin I: 0.02
Troponin I: 0.02
Troponin I: 0.02

## 2011-01-07 LAB — DIFFERENTIAL
Eosinophils Relative: 0
Lymphocytes Relative: 35
Lymphs Abs: 2.1
Monocytes Relative: 8
Neutrophils Relative %: 57

## 2011-01-07 LAB — POCT CARDIAC MARKERS
Operator id: 222501
Troponin i, poc: <0.05

## 2011-01-07 LAB — LIPID PANEL
Cholesterol: 186
LDL Cholesterol: 102 — ABNORMAL HIGH
VLDL: 22

## 2011-01-12 ENCOUNTER — Other Ambulatory Visit: Payer: Self-pay | Admitting: Family Medicine

## 2011-01-12 NOTE — Telephone Encounter (Signed)
Refill request

## 2011-01-23 LAB — URINALYSIS, ROUTINE W REFLEX MICROSCOPIC
Glucose, UA: NEGATIVE
Hgb urine dipstick: NEGATIVE
Specific Gravity, Urine: 1.004 — ABNORMAL LOW
pH: 7

## 2011-01-23 LAB — URINE CULTURE

## 2011-01-23 LAB — URINE MICROSCOPIC-ADD ON

## 2011-01-23 LAB — I-STAT 8, (EC8 V) (CONVERTED LAB)
Acid-Base Excess: 3 — ABNORMAL HIGH
Bicarbonate: 28.5 — ABNORMAL HIGH
Chloride: 100
HCT: 40
Operator id: 270651
pCO2, Ven: 44.2 — ABNORMAL LOW

## 2011-01-23 LAB — DIFFERENTIAL
Basophils Relative: 1
Eosinophils Absolute: 0
Eosinophils Relative: 0
Lymphs Abs: 2.4
Monocytes Relative: 7
Neutrophils Relative %: 53

## 2011-01-23 LAB — CBC
HCT: 35.7 — ABNORMAL LOW
Hemoglobin: 11.8 — ABNORMAL LOW
MCHC: 33.1
MCV: 85.6

## 2011-01-23 LAB — POCT CARDIAC MARKERS: Troponin i, poc: <0.05

## 2011-01-23 LAB — POCT I-STAT CREATININE: Operator id: 270651

## 2011-01-28 LAB — I-STAT 8, (EC8 V) (CONVERTED LAB)
Acid-Base Excess: 4 — ABNORMAL HIGH
Acid-Base Excess: 5 — ABNORMAL HIGH
BUN: 18
Bicarbonate: 27.5 — ABNORMAL HIGH
Chloride: 106
HCT: 44
HCT: 45
Operator id: 284251
Operator id: 284251
Potassium: 4.5
TCO2: 30
pCO2, Ven: 35.4 — ABNORMAL LOW
pH, Ven: 7.498 — ABNORMAL HIGH

## 2011-01-28 LAB — POCT I-STAT CREATININE: Creatinine, Ser: 1.3 — ABNORMAL HIGH

## 2011-03-05 ENCOUNTER — Ambulatory Visit (INDEPENDENT_AMBULATORY_CARE_PROVIDER_SITE_OTHER): Payer: Medicare Other | Admitting: Family Medicine

## 2011-03-05 ENCOUNTER — Encounter: Payer: Self-pay | Admitting: Family Medicine

## 2011-03-05 VITALS — BP 148/79 | HR 82 | Temp 98.0°F | Ht 63.0 in | Wt 153.8 lb

## 2011-03-05 DIAGNOSIS — R102 Pelvic and perineal pain: Secondary | ICD-10-CM | POA: Insufficient documentation

## 2011-03-05 DIAGNOSIS — R109 Unspecified abdominal pain: Secondary | ICD-10-CM

## 2011-03-05 DIAGNOSIS — Z23 Encounter for immunization: Secondary | ICD-10-CM

## 2011-03-05 DIAGNOSIS — N393 Stress incontinence (female) (male): Secondary | ICD-10-CM

## 2011-03-05 DIAGNOSIS — N3941 Urge incontinence: Secondary | ICD-10-CM | POA: Insufficient documentation

## 2011-03-05 LAB — POCT URINALYSIS DIPSTICK
Bilirubin, UA: NEGATIVE
Blood, UA: NEGATIVE
Glucose, UA: NEGATIVE
Nitrite, UA: NEGATIVE

## 2011-03-05 MED ORDER — CEPHALEXIN 500 MG PO CAPS: 500.0000 mg | ORAL_CAPSULE | Freq: Three times a day (TID) | ORAL | Status: AC

## 2011-03-05 NOTE — Progress Notes (Signed)
  Subjective:    Patient ID: [name], female    DOB: June 05, 1940, 70 y.o.   MRN: [id number]  HPI  1. Lower abdominal pain. Described as burning in pelvis and bilateral lower abdomen, suprapubic area. Present x2 weeks now.  Intermittent burning with urination. Also noticed a urine odor. Denies any vaginal discharge, itching, bleeding, change in amount of urination, change in bowel movements. Takes OTC "colon cleanse" herbal supplement for constipation that works usually. Also complains of some intermittent right upper thigh pain, thinks may be related to how she sits on her rolling walker with seat.   2. Stress incontinence. Has some mild leakage of urine with coughing and activities. Asks about pelvic floor exercises. Wears depends currently.   Review of Systems See HPI othewise negative for fever, chills, back pain. No sexual activity in >1 year.     Objective:   Physical Exam  Vitals reviewed. Constitutional: She is oriented to person, place, and time. She appears well-developed and well-nourished. No distress.  HENT:  Head: Normocephalic and atraumatic.  Eyes: EOM are normal. Pupils are equal, round, and reactive to light.  Cardiovascular: Normal rate and normal heart sounds.   No murmur heard. Pulmonary/Chest: Effort normal.  Abdominal: Soft. Bowel sounds are normal. She exhibits no distension. There is no tenderness. There is no rebound and no guarding.  Musculoskeletal: She exhibits no edema.  Neurological: She is alert and oriented to person, place, and time.       Uses walker for ambulation.  Psychiatric: She has a normal mood and affect.          Assessment & Plan:

## 2011-03-05 NOTE — Assessment & Plan Note (Signed)
Does not seem overly bothersome to patient. Gave handouts regarding Kegel exercise for patient to review. She had to catch private transportation so we agreed to talk more at next visit if desired.

## 2011-03-05 NOTE — Patient Instructions (Addendum)
Nice to see you again. Your symptoms sound like a urinary tract infection. WIll treat with an antibiotic. You can try kegel pelvic exercises 2-3 times daily for urine leakage. See you next week for physical.  Urinary Tract Infection Infections of the urinary tract can start in several places. A bladder infection (cystitis), a kidney infection (pyelonephritis), and a prostate infection (prostatitis) are different types of urinary tract infections (UTIs). They usually get better if treated with medicines (antibiotics) that kill germs. Take all the medicine until it is gone. You or your child may feel better in a few days, but TAKE ALL MEDICINE or the infection may not respond and may become more difficult to treat. HOME CARE INSTRUCTIONS   Drink enough water and fluids to keep the urine clear or pale yellow. Cranberry juice is especially recommended, in addition to large amounts of water.   Avoid caffeine, tea, and carbonated beverages. They tend to irritate the bladder.   Alcohol may irritate the prostate.   Only take over-the-counter or prescription medicines for pain, discomfort, or fever as directed by your caregiver.  To prevent further infections:  Empty the bladder often. Avoid holding urine for long periods of time.   After a bowel movement, women should cleanse from front to back. Use each tissue only once.   Empty the bladder before and after sexual intercourse.  FINDING OUT THE RESULTS OF YOUR TEST Not all test results are available during your visit. If your or your child's test results are not back during the visit, make an appointment with your caregiver to find out the results. Do not assume everything is normal if you have not heard from your caregiver or the medical facility. It is important for you to follow up on all test results. SEEK MEDICAL CARE IF:   There is back pain.   Your baby is older than 3 months with a rectal temperature of 100.5 F (38.1 C) or higher for  more than 1 day.   Your or your child's problems (symptoms) are no better in 3 days. Return sooner if you or your child is getting worse.  SEEK IMMEDIATE MEDICAL CARE IF:   There is severe back pain or lower abdominal pain.   You or your child develops chills.   You have a fever.   Your baby is older than 3 months with a rectal temperature of 102 F (38.9 C) or higher.   Your baby is 2 months old or younger with a rectal temperature of 100.4 F (38 C) or higher.   There is nausea or vomiting.   There is continued burning or discomfort with urination.  MAKE SURE YOU:   Understand these instructions.   Will watch your condition.   Will get help right away if you are not doing well or get worse.  Document Released: 01/09/2005 Document Revised: 12/12/2010 Document Reviewed: 08/14/2006 [facility] Patient Information 2012 [place], [place].

## 2011-03-05 NOTE — Assessment & Plan Note (Signed)
Symptoms concerning for and UA maybe indicative of UTI, though cytology not performed since lab not fully staffed today and sample was wasted prior to culture order. Will treat empirically for cystitis and follow up symptoms next week at physical exam apppointment. If still symptomatic, will obtain urine culture.

## 2011-03-06 ENCOUNTER — Telehealth: Payer: Self-pay | Admitting: Family Medicine

## 2011-03-06 NOTE — Telephone Encounter (Signed)
Pt calling emergency line tonight reporting a small bump in the bend of her arm, it is raised and is red/purple in color.  Area came up today.  No pain.  Eating and drinking well.   Pt states she "feels well".  No fever. No itching.  No other lesions or rash on body.  No problems with breathing.   Pt states she is concerned that this could be an allergic reaction to her antibiotic or reaction to the flu shot (which she received in the same arm).  States she just wanted to call and let us know about this small area.   Discussed s/s of allergic reaction and common reactions to the flu vaccine.  Pt does not have any red flag symptoms.  Pt to watch area and call if any concerns.  Also encouraged pt to go to urgent care if any new or worsening of symptoms. Discussed red flag symptoms.

## 2011-03-13 ENCOUNTER — Ambulatory Visit (INDEPENDENT_AMBULATORY_CARE_PROVIDER_SITE_OTHER): Payer: Medicare Other | Admitting: Family Medicine

## 2011-03-13 ENCOUNTER — Encounter: Payer: Self-pay | Admitting: Family Medicine

## 2011-03-13 VITALS — BP 135/76 | HR 76 | Ht 63.0 in | Wt 148.4 lb

## 2011-03-13 DIAGNOSIS — I10 Essential (primary) hypertension: Secondary | ICD-10-CM

## 2011-03-13 DIAGNOSIS — L293 Anogenital pruritus, unspecified: Secondary | ICD-10-CM

## 2011-03-13 DIAGNOSIS — N898 Other specified noninflammatory disorders of vagina: Secondary | ICD-10-CM | POA: Insufficient documentation

## 2011-03-13 DIAGNOSIS — M109 Gout, unspecified: Secondary | ICD-10-CM

## 2011-03-13 DIAGNOSIS — E7800 Pure hypercholesterolemia, unspecified: Secondary | ICD-10-CM

## 2011-03-13 DIAGNOSIS — F319 Bipolar disorder, unspecified: Secondary | ICD-10-CM

## 2011-03-13 MED ORDER — COLCHICINE 0.6 MG PO TABS: 0.6000 mg | ORAL_TABLET | ORAL | Status: DC | PRN

## 2011-03-13 MED ORDER — FLUCONAZOLE 150 MG PO TABS: 150.0000 mg | ORAL_TABLET | Freq: Once | ORAL | Status: AC

## 2011-03-13 NOTE — Patient Instructions (Signed)
Will treat for yeast infection and itching with one tablet of fluconazole. Make an appointment for check up in 4-6 months.  I have refilled your gout medication.

## 2011-03-14 ENCOUNTER — Encounter: Payer: Self-pay | Admitting: Family Medicine

## 2011-03-14 NOTE — Assessment & Plan Note (Signed)
LDL at goal this year on fish oil. Repeat in 6 months.

## 2011-03-14 NOTE — Assessment & Plan Note (Signed)
At goal. No change in medications.

## 2011-03-14 NOTE — Progress Notes (Signed)
  Subjective:     [name] is a 70 y.o. female and is here for a comprehensive physical exam. The patient reports problems - vaginal itching for 2 days after completion of keflex for UTI last week. UTI symptoms/lower abdominal burning is resolved.[name]  History   Social History  . Marital Status: Divorced    Spouse Name: N/A    Number of Children: N/A  . Years of Education: N/A   Occupational History  . 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  Social History Main Topics  . Smoking status: Never Smoker   . Smokeless tobacco: Never Used  . Alcohol Use: No  . Drug Use: No  . Sexually Active: Not on file   Other Topics Concern  . Not on file   Social History Narrative   Health Care POA: Maia Petties, daughterEmergency Contact: Carlus Pavlov [phone] of Life Plan: DNRWho lives with you: Live by her self.Any pets: noneDiet: Patient has a varied diet of protein, starch, and vegetables.Exercise: Patient walks and uses stationary bike.Seatbelts: Patient reports wearing seatbelt when in vehicle.Sun Exposure/Protection: Hobbies: Reading the BibleLives in [place], uses medicaid transportation to get to appointments.   Health Maintenance  Topic Date Due  . Tetanus/tdap  10/14/2011  . Influenza Vaccine  01/14/2012  . Colonoscopy  01/25/2014  . Pneumococcal Polysaccharide Vaccine Age 64 And Over  Completed  . Zostavax  Addressed   Mammogram-refused for past several years due to discomfort. Discussed risks/benefits today. Patient aware and will continue self-exams.   The following portions of the patient's history were reviewed and updated as appropriate: allergies, current medications, past family history, past medical history, past social history, past surgical history and problem list.  Review of Systems Pertinent items are noted in HPI.   Objective:    BP 135/76  Pulse 76  Ht 5\' 3"  (1.6 m)  Wt 148 lb 6.4 oz (67.314 kg)  BMI 26.29  kg/m2 General appearance: alert, cooperative, appears stated age and no distress Head: Normocephalic, without obvious abnormality, atraumatic Lungs: clear to auscultation bilaterally Breasts: normal appearance, no masses or tenderness, Inspection negative, No nipple retraction or dimpling, No axillary or supraclavicular adenopathy Heart: regular rate and rhythm, S1, S2 normal, no murmur, click, rub or gallop Abdomen: soft, non-tender; bowel sounds normal; no masses,  no organomegaly Pelvic: not indicated; post-menopausal, no abnormal Pap smears in past and no external rashes or vulvar abnormalities noted. No discharge on bimanual exam. Extremities: extremities normal, atraumatic, no cyanosis or edema Neurologic: Alert and oriented X 3, normal strength and tone. Normal symmetric reflexes. Normal coordination and gait    Assessment:    Healthy female exam. Early candidal vaginitis post-antibiotic treatment     Plan:     See After Visit Summary for Counseling Recommendations  Will treat itching with fluconazole x 1 and reassess prn if not resolved.

## 2011-03-14 NOTE — Assessment & Plan Note (Signed)
No signs of deterioration. Repeat labs in one year. If renal function declining, would refer to nephrology.

## 2011-03-14 NOTE — Assessment & Plan Note (Signed)
Refilled prn colchicine for flares only. None recently.

## 2011-04-02 ENCOUNTER — Telehealth: Payer: Self-pay | Admitting: Family Medicine

## 2011-04-02 NOTE — Telephone Encounter (Signed)
Patient states she was cooking an egg this AM and grease popped on her face , then she put vinegar on the  area where grease had popped . The vinegar got in her eye and now eye is watering. She has not rinsed eye. Advised to wash out eye well with water several times. If continues to bother her to come in for check. Consulted with Dr. Earnest Bailey.

## 2011-04-02 NOTE — Telephone Encounter (Signed)
Pt got vinegar in her eye this am and now her eye keep "running", wants to know what to do.

## 2011-05-06 DIAGNOSIS — I10 Essential (primary) hypertension: Secondary | ICD-10-CM | POA: Diagnosis not present

## 2011-05-06 DIAGNOSIS — R079 Chest pain, unspecified: Secondary | ICD-10-CM | POA: Diagnosis not present

## 2011-05-13 DIAGNOSIS — F259 Schizoaffective disorder, unspecified: Secondary | ICD-10-CM | POA: Diagnosis not present

## 2011-05-13 DIAGNOSIS — R079 Chest pain, unspecified: Secondary | ICD-10-CM | POA: Diagnosis not present

## 2011-05-20 DIAGNOSIS — M79609 Pain in unspecified limb: Secondary | ICD-10-CM | POA: Diagnosis not present

## 2011-05-20 DIAGNOSIS — B351 Tinea unguium: Secondary | ICD-10-CM | POA: Diagnosis not present

## 2011-05-28 DIAGNOSIS — R079 Chest pain, unspecified: Secondary | ICD-10-CM | POA: Diagnosis not present

## 2011-05-28 DIAGNOSIS — I10 Essential (primary) hypertension: Secondary | ICD-10-CM | POA: Diagnosis not present

## 2011-06-06 ENCOUNTER — Telehealth: Payer: Self-pay | Admitting: Family Medicine

## 2011-06-06 NOTE — Telephone Encounter (Signed)
Returned call. Ms. Minogue having some abdominal pain/cramping today. Trying to take nap but feels like indigestion. ATe some seafood from [place] last evening and thinks it isn't sitting well. Asks what can be taken for this. Advised may try pepto bismol or mylanta, but to avoid taking along with her other medications. Advised to present for evaluation if worsens or emesis.

## 2011-06-06 NOTE — Telephone Encounter (Signed)
[name], Would you like to call this patient or should I call her and have her come in to discuss this. ----[place] Dec

## 2011-06-06 NOTE — Telephone Encounter (Signed)
Patient is calling to speak to a nurse about her cramping stomache.

## 2011-06-06 NOTE — Telephone Encounter (Signed)
Patient is calling to speak to the Dr. Cristal Ford about getting an Aide to help her in her home.

## 2011-06-07 ENCOUNTER — Emergency Department (HOSPITAL_COMMUNITY)
Admission: EM | Admit: 2011-06-07 | Discharge: 2011-06-08 | Disposition: A | Discharge status: Home / Self Care | Payer: Medicare Other | Attending: Emergency Medicine | Admitting: Emergency Medicine

## 2011-06-07 ENCOUNTER — Encounter (HOSPITAL_COMMUNITY): Payer: Self-pay | Admitting: Emergency Medicine

## 2011-06-07 DIAGNOSIS — R300 Dysuria: Secondary | ICD-10-CM | POA: Insufficient documentation

## 2011-06-07 DIAGNOSIS — R142 Eructation: Secondary | ICD-10-CM | POA: Insufficient documentation

## 2011-06-07 DIAGNOSIS — Z8639 Personal history of other endocrine, nutritional and metabolic disease: Secondary | ICD-10-CM | POA: Insufficient documentation

## 2011-06-07 DIAGNOSIS — F3130 Bipolar disorder, current episode depressed, mild or moderate severity, unspecified: Secondary | ICD-10-CM | POA: Insufficient documentation

## 2011-06-07 DIAGNOSIS — R110 Nausea: Secondary | ICD-10-CM | POA: Diagnosis not present

## 2011-06-07 DIAGNOSIS — Z862 Personal history of diseases of the blood and blood-forming organs and certain disorders involving the immune mechanism: Secondary | ICD-10-CM | POA: Insufficient documentation

## 2011-06-07 DIAGNOSIS — R109 Unspecified abdominal pain: Secondary | ICD-10-CM | POA: Diagnosis not present

## 2011-06-07 DIAGNOSIS — K5792 Diverticulitis of intestine, part unspecified, without perforation or abscess without bleeding: Secondary | ICD-10-CM

## 2011-06-07 DIAGNOSIS — G40909 Epilepsy, unspecified, not intractable, without status epilepticus: Secondary | ICD-10-CM | POA: Insufficient documentation

## 2011-06-07 DIAGNOSIS — Z79899 Other long term (current) drug therapy: Secondary | ICD-10-CM | POA: Insufficient documentation

## 2011-06-07 DIAGNOSIS — R141 Gas pain: Secondary | ICD-10-CM | POA: Insufficient documentation

## 2011-06-07 DIAGNOSIS — K5900 Constipation, unspecified: Secondary | ICD-10-CM | POA: Insufficient documentation

## 2011-06-07 DIAGNOSIS — N390 Urinary tract infection, site not specified: Secondary | ICD-10-CM | POA: Diagnosis not present

## 2011-06-07 DIAGNOSIS — R10819 Abdominal tenderness, unspecified site: Secondary | ICD-10-CM | POA: Insufficient documentation

## 2011-06-07 DIAGNOSIS — K5732 Diverticulitis of large intestine without perforation or abscess without bleeding: Secondary | ICD-10-CM | POA: Diagnosis not present

## 2011-06-07 DIAGNOSIS — I129 Hypertensive chronic kidney disease with stage 1 through stage 4 chronic kidney disease, or unspecified chronic kidney disease: Secondary | ICD-10-CM | POA: Insufficient documentation

## 2011-06-07 DIAGNOSIS — N189 Chronic kidney disease, unspecified: Secondary | ICD-10-CM | POA: Insufficient documentation

## 2011-06-07 DIAGNOSIS — R5383 Other fatigue: Secondary | ICD-10-CM | POA: Insufficient documentation

## 2011-06-07 DIAGNOSIS — R5381 Other malaise: Secondary | ICD-10-CM | POA: Insufficient documentation

## 2011-06-07 NOTE — ED Notes (Signed)
NFA:[id number]<QM> Expected date:06/07/11<BR> Expected time:11:26 PM<BR> Means of arrival:Ambulance<BR> Comments:<BR> EMS 32 Ptar - abd pain

## 2011-06-07 NOTE — ED Notes (Signed)
Per PTAR, pt. Is from home with complaint of abdominal pain and nausea which started since Wednesday. Denies of chest pain nor SOB, pt. Is alert and oriented and on room air.

## 2011-06-08 ENCOUNTER — Emergency Department (HOSPITAL_COMMUNITY): Payer: Medicare Other

## 2011-06-08 DIAGNOSIS — R300 Dysuria: Secondary | ICD-10-CM | POA: Diagnosis not present

## 2011-06-08 DIAGNOSIS — Z79899 Other long term (current) drug therapy: Secondary | ICD-10-CM | POA: Diagnosis not present

## 2011-06-08 DIAGNOSIS — R109 Unspecified abdominal pain: Secondary | ICD-10-CM | POA: Diagnosis not present

## 2011-06-08 DIAGNOSIS — Z862 Personal history of diseases of the blood and blood-forming organs and certain disorders involving the immune mechanism: Secondary | ICD-10-CM | POA: Diagnosis not present

## 2011-06-08 DIAGNOSIS — K5900 Constipation, unspecified: Secondary | ICD-10-CM | POA: Diagnosis not present

## 2011-06-08 DIAGNOSIS — N189 Chronic kidney disease, unspecified: Secondary | ICD-10-CM | POA: Diagnosis not present

## 2011-06-08 DIAGNOSIS — R141 Gas pain: Secondary | ICD-10-CM | POA: Diagnosis not present

## 2011-06-08 DIAGNOSIS — G40909 Epilepsy, unspecified, not intractable, without status epilepticus: Secondary | ICD-10-CM | POA: Diagnosis not present

## 2011-06-08 DIAGNOSIS — R10819 Abdominal tenderness, unspecified site: Secondary | ICD-10-CM | POA: Diagnosis not present

## 2011-06-08 DIAGNOSIS — I129 Hypertensive chronic kidney disease with stage 1 through stage 4 chronic kidney disease, or unspecified chronic kidney disease: Secondary | ICD-10-CM | POA: Diagnosis not present

## 2011-06-08 DIAGNOSIS — F3130 Bipolar disorder, current episode depressed, mild or moderate severity, unspecified: Secondary | ICD-10-CM | POA: Diagnosis not present

## 2011-06-08 DIAGNOSIS — N390 Urinary tract infection, site not specified: Secondary | ICD-10-CM | POA: Diagnosis not present

## 2011-06-08 DIAGNOSIS — R5381 Other malaise: Secondary | ICD-10-CM | POA: Diagnosis not present

## 2011-06-08 DIAGNOSIS — R110 Nausea: Secondary | ICD-10-CM | POA: Diagnosis not present

## 2011-06-08 DIAGNOSIS — K5732 Diverticulitis of large intestine without perforation or abscess without bleeding: Secondary | ICD-10-CM | POA: Diagnosis not present

## 2011-06-08 LAB — DIFFERENTIAL
Lymphocytes Relative: 23 % (ref 12–46)
Lymphs Abs: 2 10*3/uL (ref 0.7–4.0)
Monocytes Relative: 13 % — ABNORMAL HIGH (ref 3–12)
Neutro Abs: 5.7 10*3/uL (ref 1.7–7.7)
Neutrophils Relative %: 64 % (ref 43–77)

## 2011-06-08 LAB — URINALYSIS, ROUTINE W REFLEX MICROSCOPIC
Bilirubin Urine: NEGATIVE
Hgb urine dipstick: NEGATIVE
Ketones, ur: NEGATIVE mg/dL
Nitrite: NEGATIVE
Specific Gravity, Urine: 1.011 (ref 1.005–1.030)
Urobilinogen, UA: 0.2 mg/dL (ref 0.0–1.0)

## 2011-06-08 LAB — CBC
Hemoglobin: 10.3 g/dL — ABNORMAL LOW (ref 12.0–15.0)
MCH: 28.4 pg (ref 26.0–34.0)
Platelets: 121 10*3/uL — ABNORMAL LOW (ref 150–400)
RBC: 3.63 MIL/uL — ABNORMAL LOW (ref 3.87–5.11)
WBC: 8.8 10*3/uL (ref 4.0–10.5)

## 2011-06-08 LAB — COMPREHENSIVE METABOLIC PANEL
ALT: 11 U/L (ref 0–35)
Alkaline Phosphatase: 43 U/L (ref 39–117)
BUN: 11 mg/dL (ref 6–23)
CO2: 27 mEq/L (ref 19–32)
Chloride: 98 mEq/L (ref 96–112)
GFR calc Af Amer: 58 mL/min — ABNORMAL LOW (ref >90)
Glucose, Bld: 111 mg/dL — ABNORMAL HIGH (ref 70–99)
Potassium: 3.7 mEq/L (ref 3.5–5.1)
Sodium: 132 mEq/L — ABNORMAL LOW (ref 135–145)
Total Bilirubin: 0.3 mg/dL (ref 0.3–1.2)

## 2011-06-08 LAB — URINE MICROSCOPIC-ADD ON

## 2011-06-08 LAB — LIPASE, BLOOD: Lipase: 23 U/L (ref 11–59)

## 2011-06-08 MED ORDER — HYDROCODONE-ACETAMINOPHEN 5-500 MG PO TABS: 1.0000 | ORAL_TABLET | Freq: Four times a day (QID) | ORAL | Status: DC | PRN

## 2011-06-08 MED ORDER — IOHEXOL 300 MG/ML  SOLN
100.0000 mL | Freq: Once | INTRAMUSCULAR | Status: AC | PRN
  Administered 2011-06-08: 100 mL via INTRAVENOUS

## 2011-06-08 MED ORDER — CIPROFLOXACIN HCL 500 MG PO TABS: 500.0000 mg | ORAL_TABLET | Freq: Two times a day (BID) | ORAL | Status: DC

## 2011-06-08 MED ORDER — METRONIDAZOLE 500 MG PO TABS
500.0000 mg | ORAL_TABLET | Freq: Once | ORAL | Status: AC
  Administered 2011-06-08: 500 mg via ORAL
  Filled 2011-06-08: qty 1

## 2011-06-08 MED ORDER — CIPROFLOXACIN HCL 500 MG PO TABS
500.0000 mg | ORAL_TABLET | Freq: Once | ORAL | Status: AC
  Administered 2011-06-08: 500 mg via ORAL
  Filled 2011-06-08: qty 1

## 2011-06-08 MED ORDER — DEXTROSE 5 % IV SOLN
1.0000 g | Freq: Once | INTRAVENOUS | Status: AC
  Administered 2011-06-08: 1 g via INTRAVENOUS
  Filled 2011-06-08: qty 10

## 2011-06-08 MED ORDER — MORPHINE SULFATE 4 MG/ML IJ SOLN
4.0000 mg | Freq: Once | INTRAMUSCULAR | Status: AC
  Administered 2011-06-08: 4 mg via INTRAVENOUS
  Filled 2011-06-08: qty 1

## 2011-06-08 MED ORDER — ONDANSETRON HCL 4 MG/2ML IJ SOLN
4.0000 mg | Freq: Once | INTRAMUSCULAR | Status: AC
  Administered 2011-06-08: 4 mg via INTRAVENOUS
  Filled 2011-06-08: qty 2

## 2011-06-08 MED ORDER — SODIUM CHLORIDE 0.9 % IV BOLUS (SEPSIS)
500.0000 mL | Freq: Once | INTRAVENOUS | Status: AC
  Administered 2011-06-08: 500 mL via INTRAVENOUS

## 2011-06-08 MED ORDER — METRONIDAZOLE 500 MG PO TABS: 500.0000 mg | ORAL_TABLET | Freq: Two times a day (BID) | ORAL | Status: DC

## 2011-06-08 MED ORDER — ONDANSETRON HCL 4 MG PO TABS: 4.0000 mg | ORAL_TABLET | Freq: Four times a day (QID) | ORAL | Status: DC

## 2011-06-08 NOTE — ED Notes (Signed)
Dark green tube to mini lab, 2 lt green, lavender, and blue tube sent to main lab

## 2011-06-08 NOTE — ED Notes (Signed)
Informed CT that pt is done drinking the contrast media. 

## 2011-06-08 NOTE — Discharge Instructions (Signed)
Diverticulitis Small pockets or "bubbles" can develop in the wall of the intestine. Diverticulitis is when those pockets become infected and inflamed. This causes stomach pain (usually on the left side). HOME CARE  Take all medicine as told by your doctor.   Try a clear liquid diet (broth, tea, or water) for as long as told by your doctor.   Keep all follow-up visits with your doctor.   You may be put on a low-fiber diet once you start feeling better. Here are foods that have low-fiber:   White breads, cereals, rice, and pasta.   Cooked fruits and vegetables or soft fresh fruits and vegetables without the skin.   Ground or well-cooked tender beef, ham, veal, lamb, pork, or poultry.   Eggs and seafood.   After you are doing well on the low-fiber diet, you may be put on a high-fiber diet. Here are ways to increase your fiber:   Choose whole-grain breads, cereals, pasta, and brown rice.   Choose fruits and vegetables with skin on. Do not overcook the vegetables.   Choose nuts, seeds, legumes, dried peas, beans, and lentils.   Look for food products that have more than 3 grams of fiber per serving on the food label.  GET HELP RIGHT AWAY IF:  Your pain does not get better or gets worse.   You have trouble eating food.   You are not pooping (having bowel movements) like normal.   You have a temperature by mouth above 102 F (38.9 C), not controlled by medicine.   You keep throwing up (vomiting).   You have bloody or black, tarry poop (stools).   You are getting worse and not better.  MAKE SURE YOU:   Understand these instructions.   Will watch your condition.   Will get help right away if you are not doing well or get worse.  Document Released: 09/18/2007 Document Revised: 12/12/2010 Document Reviewed: 02/20/2009 [facility] Patient Information 2012 [place], [place].Urinary Tract Infection Infections of the urinary tract can start in several places. A bladder infection  (cystitis), a kidney infection (pyelonephritis), and a prostate infection (prostatitis) are different types of urinary tract infections (UTIs). They usually get better if treated with medicines (antibiotics) that kill germs. Take all the medicine until it is gone. You or your child may feel better in a few days, but TAKE ALL MEDICINE or the infection may not respond and may become more difficult to treat. HOME CARE INSTRUCTIONS   Drink enough water and fluids to keep the urine clear or pale yellow. Cranberry juice is especially recommended, in addition to large amounts of water.   Avoid caffeine, tea, and carbonated beverages. They tend to irritate the bladder.   Alcohol may irritate the prostate.   Only take over-the-counter or prescription medicines for pain, discomfort, or fever as directed by your caregiver.  To prevent further infections:  Empty the bladder often. Avoid holding urine for long periods of time.   After a bowel movement, women should cleanse from front to back. Use each tissue only once.   Empty the bladder before and after sexual intercourse.  FINDING OUT THE RESULTS OF YOUR TEST Not all test results are available during your visit. If your or your child's test results are not back during the visit, make an appointment with your caregiver to find out the results. Do not assume everything is normal if you have not heard from your caregiver or the medical facility. It is important for you to follow  up on all test results. SEEK MEDICAL CARE IF:   There is back pain.   Your baby is older than 3 months with a rectal temperature of 100.5 F (38.1 C) or higher for more than 1 day.   Your or your child's problems (symptoms) are no better in 3 days. Return sooner if you or your child is getting worse.  SEEK IMMEDIATE MEDICAL CARE IF:   There is severe back pain or lower abdominal pain.   You or your child develops chills.   You have a fever.   Your baby is older than 3  months with a rectal temperature of 102 F (38.9 C) or higher.   Your baby is 6 months old or younger with a rectal temperature of 100.4 F (38 C) or higher.   There is nausea or vomiting.   There is continued burning or discomfort with urination.  MAKE SURE YOU:   Understand these instructions.   Will watch your condition.   Will get help right away if you are not doing well or get worse.  Document Released: 01/09/2005 Document Revised: 12/12/2010 Document Reviewed: 08/14/2006 [facility] Patient Information 2012 [place], [place].

## 2011-06-08 NOTE — ED Provider Notes (Signed)
History     CSN: [id number]  Arrival date & time 06/07/11  2331   First MD Initiated Contact with Patient 06/08/11 0046      Chief Complaint  Patient presents with  . Abdominal Pain  . Nausea    (Consider location/radiation/quality/duration/timing/severity/associated sxs/prior treatment) HPI Comments: Patient presents with three-day history of abdominal pain she describes as a cramping all over her abdomen. She also has a worsening pain in the suprapubic area. She's had some nausea but no vomiting. She's been feeling weak all over. Denies any fevers or chills. She does have some burning on urination. Her last bowel movement was yesterday and was normal. Although she does say it was hard. Denies any diarrhea. She states that the pain has been gradually worsening since Wednesday.  The history is provided by the patient.    Past Medical History  Diagnosis Date  . Depression   . Gout   . Bipolar 1 disorder   . Schizoaffective disorder   . Chronic kidney disease   . Hypertension   . Seizure disorder   . Thrombocytopenia   . Anemia     Past Surgical History  Procedure Date  . Tubal ligation   . Appendectomy     History reviewed. No pertinent family history.  History  Substance Use Topics  . Smoking status: Never Smoker   . Smokeless tobacco: Never Used  . Alcohol Use: No    OB History    Grav Para Term Preterm Abortions TAB SAB Ect Mult Living                  Review of Systems  Constitutional: Positive for appetite change and fatigue. Negative for fever, chills and diaphoresis.  HENT: Negative for congestion, rhinorrhea and sneezing.   Eyes: Negative.   Respiratory: Negative for cough, chest tightness and shortness of breath.   Cardiovascular: Negative for chest pain and leg swelling.  Gastrointestinal: Positive for nausea, abdominal pain, constipation and abdominal distention. Negative for vomiting, diarrhea and blood in stool.  Genitourinary: Positive for  dysuria. Negative for frequency, hematuria, flank pain and difficulty urinating.  Musculoskeletal: Negative for back pain and arthralgias.  Skin: Negative for rash.  Neurological: Negative for dizziness, speech difficulty, weakness, numbness and headaches.    Allergies  Benztropine mesylate; Lisinopril; and Sulfonamide derivatives  Home Medications   Current Outpatient Rx  Name Route Sig Dispense Refill  . ACETAMINOPHEN ER 650 MG PO TBCR  SIG Take 1 tab by mouth every 6 hours as needed for pain     . VITAMIN C 500 MG PO TABS Oral Take 500 mg by mouth daily.      . ASPIRIN 81 MG PO CHEW Oral Chew 81 mg by mouth daily.      . B COMPLEX PO TABS Oral Take 1 tablet by mouth daily.     [name] CALCIUM 600-200 MG-UNIT PO TABS Oral Take 1 tablet by mouth daily.     [name] CALCIUM CARBONATE-VITAMIN D 600-400 MG-UNIT PO TABS Oral Take 1 tablet by mouth every evening.     [name] CLONAZEPAM 0.5 MG PO TABS Oral Take 0.5 mg by mouth at bedtime as needed. Anxiety    . COLCHICINE 0.6 MG PO TABS Oral Take 1 tablet (0.6 mg total) by mouth every 2 (two) hours as needed (for gout, toe pain). 30 tablet 2  . DILTIAZEM HCL ER COATED BEADS 120 MG PO CP24 Oral Take 120 mg by mouth daily. please use in place of  short acting cardizem     . DIVALPROEX SODIUM 500 MG PO TBEC Oral Take 500 mg by mouth 2 (two) times daily.     . ADULT MULTIVITAMIN W/MINERALS CH Oral Take 1 tablet by mouth daily.    [name] FISH OIL 1000 MG PO CAPS Oral Take 1 capsule by mouth daily.      [name] RISPERIDONE 1 MG PO TABS Oral Take 0.5-1 mg by mouth 2 (two) times daily. .5mg  in the morning 1mg  at evening.    [name] VITAMIN E 400 UNITS PO CAPS Oral Take 400 Units by mouth daily.     [name] CIPROFLOXACIN HCL 500 MG PO TABS Oral Take 1 tablet (500 mg total) by mouth every 12 (twelve) hours. 14 tablet 0  . HYDROCODONE-ACETAMINOPHEN 5-500 MG PO TABS Oral Take 1-2 tablets by mouth every 6 (six) hours as needed for pain. 15 tablet 0  . METRONIDAZOLE 500 MG PO TABS Oral Take 1  tablet (500 mg total) by mouth 2 (two) times daily. 14 tablet 0  . ONDANSETRON HCL 4 MG PO TABS Oral Take 1 tablet (4 mg total) by mouth every 6 (six) hours. 12 tablet 0    BP 111/57  Pulse 68  Temp(Src) 98.7 F (37.1 C) (Oral)  Resp 17  SpO2 94%  Physical Exam  Constitutional: She is oriented to person, place, and time. She appears well-developed and well-nourished.  HENT:  Head: Normocephalic and atraumatic.  Eyes: Pupils are equal, round, and reactive to light.  Neck: Normal range of motion. Neck supple.  Cardiovascular: Normal rate, regular rhythm and normal heart sounds.   Pulmonary/Chest: Effort normal and breath sounds normal. No respiratory distress. She has no wheezes. She has no rales. She exhibits no tenderness.  Abdominal: Soft. Bowel sounds are normal. She exhibits distension. There is tenderness. There is no rebound and no guarding.       Moderate diffuse tenderness throughout the abdomen slightly worse in the suprapubic area. No rebound no guarding. Slightly tympanic to percussion  Musculoskeletal: Normal range of motion. She exhibits no edema.  Lymphadenopathy:    She has no cervical adenopathy.  Neurological: She is alert and oriented to person, place, and time.  Skin: Skin is warm and dry. No rash noted.  Psychiatric: She has a normal mood and affect.    ED Course  Procedures (including critical care time) Results for orders placed during the hospital encounter of 06/07/11  CBC      Component Value Range   WBC 8.8  4.0 - 10.5 (K/uL)   RBC 3.63 (*) 3.87 - 5.11 (MIL/uL)   Hemoglobin 10.3 (*) 12.0 - 15.0 (g/dL)   HCT 16.1 (*) 09.6 - 46.0 (%)   MCV 84.3  78.0 - 100.0 (fL)   MCH 28.4  26.0 - 34.0 (pg)   MCHC 33.7  30.0 - 36.0 (g/dL)   RDW 04.5 (*) 40.9 - 15.5 (%)   Platelets 121 (*) 150 - 400 (K/uL)  DIFFERENTIAL      Component Value Range   Neutrophils Relative 64  43 - 77 (%)   Neutro Abs 5.7  1.7 - 7.7 (K/uL)   Lymphocytes Relative 23  12 - 46 (%)    Lymphs Abs 2.0  0.7 - 4.0 (K/uL)   Monocytes Relative 13 (*) 3 - 12 (%)   Monocytes Absolute 1.2 (*) 0.1 - 1.0 (K/uL)   Eosinophils Relative 0  0 - 5 (%)   Eosinophils Absolute 0.0  0.0 - 0.7 (K/uL)  Basophils Relative 0  0 - 1 (%)   Basophils Absolute 0.0  0.0 - 0.1 (K/uL)  COMPREHENSIVE METABOLIC PANEL      Component Value Range   Sodium 132 (*) 135 - 145 (mEq/L)   Potassium 3.7  3.5 - 5.1 (mEq/L)   Chloride 98  96 - 112 (mEq/L)   CO2 27  19 - 32 (mEq/L)   Glucose, Bld 111 (*) 70 - 99 (mg/dL)   BUN 11  6 - 23 (mg/dL)   Creatinine, Ser 1.61  0.50 - 1.10 (mg/dL)   Calcium 09.6  8.4 - 10.5 (mg/dL)   Total Protein 6.4  6.0 - 8.3 (g/dL)   Albumin 3.2 (*) 3.5 - 5.2 (g/dL)   AST 17  0 - 37 (U/L)   ALT 11  0 - 35 (U/L)   Alkaline Phosphatase 43  39 - 117 (U/L)   Total Bilirubin 0.3  0.3 - 1.2 (mg/dL)   GFR calc non Af Amer 50 (*) >90 (mL/min)   GFR calc Af Amer 58 (*) >90 (mL/min)  LIPASE, BLOOD      Component Value Range   Lipase 23  11 - 59 (U/L)  URINALYSIS, ROUTINE W REFLEX MICROSCOPIC      Component Value Range   Color, Urine YELLOW  YELLOW    APPearance CLEAR  CLEAR    Specific Gravity, Urine 1.011  1.005 - 1.030    pH 7.0  5.0 - 8.0    Glucose, UA NEGATIVE  NEGATIVE (mg/dL)   Hgb urine dipstick NEGATIVE  NEGATIVE    Bilirubin Urine NEGATIVE  NEGATIVE    Ketones, ur NEGATIVE  NEGATIVE (mg/dL)   Protein, ur NEGATIVE  NEGATIVE (mg/dL)   Urobilinogen, UA 0.2  0.0 - 1.0 (mg/dL)   Nitrite NEGATIVE  NEGATIVE    Leukocytes, UA MODERATE (*) NEGATIVE   URINE MICROSCOPIC-ADD ON      Component Value Range   Squamous Epithelial / LPF RARE  RARE    WBC, UA 11-20  <3 (WBC/hpf)   Bacteria, UA RARE  RARE    No results found.   Ct Abdomen Pelvis W Contrast  06/08/2011  *RADIOLOGY REPORT*  Clinical Data: Abdominal pain, nausea.  CT ABDOMEN AND PELVIS WITH CONTRAST  Technique:  Multidetector CT imaging of the abdomen and pelvis was performed following the standard protocol during  bolus administration of intravenous contrast.  Contrast: OMNIPAQUE IOHEXOL 300 MG/ML IV SOLN  Comparison: None.  Findings: Dependent atelectasis in the lung bases.  Heart is normal size.  No effusions.  Mild fatty infiltration of the liver.  Gallbladder is contracted, grossly unremarkable.  Spleen, pancreas, adrenals, kidneys are unremarkable.  Appendix is not definitively seen.  Immediately adjacent to the terminal ileum and ileocecal valve, there is a small fluid containing structure noted.  Slight surrounding inflammatory change adjacent to this structure.  This could represent an inflamed diverticulum and diverticulitis.  Small bowel is decompressed.  Uterus, adnexa urinary bladder are unremarkable.  No acute bony abnormality.  IMPRESSION: Question inflamed diverticulum/diverticulitis in the region of the ileocecal valve.  Appendix not definitively seen.  Original Report Authenticated By: Cyndie Chime, M.D.     1. UTI (lower urinary tract infection)   2. Diverticulitis       MDM  Pt with UTI, possible small area of diverticulitis.  Discussed findings with pt, she feels much better and wants to go home.  Will tx with cipro/flagyl, vicodin and zofran.  Advised to f/u with her  PMD on Monday and return here for any worsening symptoms over the weekend        Rolan Bucco, MD 06/08/11 [phone]

## 2011-06-08 NOTE — ED Notes (Signed)
Johnny at [phone] called and stated that the patient was prescribed Cipro and Zofran. Patient takes Risperdal and Cipro can cause arrythmia when taken with it. Medication was changed to Augmentin 875/125 mg PO Q 12 hr X 10 days. May D/C Flagyl and Cipro. Prescribed by Fayrene Helper PA-C.

## 2011-06-09 LAB — URINE CULTURE
Colony Count: 60000
Culture  Setup Time: 201302231104

## 2011-06-10 ENCOUNTER — Emergency Department (HOSPITAL_COMMUNITY)
Admission: EM | Admit: 2011-06-10 | Discharge: 2011-06-10 | Disposition: A | Discharge status: Home Health | Payer: Medicare Other | Attending: Emergency Medicine | Admitting: Emergency Medicine

## 2011-06-10 ENCOUNTER — Encounter (HOSPITAL_COMMUNITY): Payer: Self-pay | Admitting: *Deleted

## 2011-06-10 DIAGNOSIS — I129 Hypertensive chronic kidney disease with stage 1 through stage 4 chronic kidney disease, or unspecified chronic kidney disease: Secondary | ICD-10-CM | POA: Insufficient documentation

## 2011-06-10 DIAGNOSIS — Z862 Personal history of diseases of the blood and blood-forming organs and certain disorders involving the immune mechanism: Secondary | ICD-10-CM | POA: Insufficient documentation

## 2011-06-10 DIAGNOSIS — R5381 Other malaise: Secondary | ICD-10-CM | POA: Insufficient documentation

## 2011-06-10 DIAGNOSIS — R197 Diarrhea, unspecified: Secondary | ICD-10-CM | POA: Diagnosis not present

## 2011-06-10 DIAGNOSIS — Z7982 Long term (current) use of aspirin: Secondary | ICD-10-CM | POA: Diagnosis not present

## 2011-06-10 DIAGNOSIS — F3130 Bipolar disorder, current episode depressed, mild or moderate severity, unspecified: Secondary | ICD-10-CM | POA: Diagnosis not present

## 2011-06-10 DIAGNOSIS — T50995A Adverse effect of other drugs, medicaments and biological substances, initial encounter: Secondary | ICD-10-CM | POA: Diagnosis not present

## 2011-06-10 DIAGNOSIS — R109 Unspecified abdominal pain: Secondary | ICD-10-CM | POA: Insufficient documentation

## 2011-06-10 DIAGNOSIS — T50905A Adverse effect of unspecified drugs, medicaments and biological substances, initial encounter: Secondary | ICD-10-CM

## 2011-06-10 DIAGNOSIS — N189 Chronic kidney disease, unspecified: Secondary | ICD-10-CM | POA: Diagnosis not present

## 2011-06-10 DIAGNOSIS — Z8639 Personal history of other endocrine, nutritional and metabolic disease: Secondary | ICD-10-CM | POA: Insufficient documentation

## 2011-06-10 DIAGNOSIS — Z79899 Other long term (current) drug therapy: Secondary | ICD-10-CM | POA: Diagnosis not present

## 2011-06-10 DIAGNOSIS — G40909 Epilepsy, unspecified, not intractable, without status epilepticus: Secondary | ICD-10-CM | POA: Diagnosis not present

## 2011-06-10 DIAGNOSIS — R1031 Right lower quadrant pain: Secondary | ICD-10-CM | POA: Diagnosis not present

## 2011-06-10 DIAGNOSIS — R1032 Left lower quadrant pain: Secondary | ICD-10-CM | POA: Diagnosis not present

## 2011-06-10 LAB — CBC
Hemoglobin: 11.4 g/dL — ABNORMAL LOW (ref 12.0–15.0)
MCH: 28.1 pg (ref 26.0–34.0)
MCH: 29.1 pg (ref 26.0–34.0)
MCHC: 32.5 g/dL (ref 30.0–36.0)
MCHC: 34.1 g/dL (ref 30.0–36.0)
MCV: 86.7 fL (ref 78.0–100.0)
MCV: 85.3 fL (ref 78.0–100.0)
Platelets: 125 10*3/uL — ABNORMAL LOW (ref 150–400)
RBC: 4.16 MIL/uL (ref 3.87–5.11)
RDW: 15.8 % — ABNORMAL HIGH (ref 11.5–15.5)

## 2011-06-10 LAB — URINALYSIS, ROUTINE W REFLEX MICROSCOPIC
Bilirubin Urine: NEGATIVE
Hgb urine dipstick: NEGATIVE
Nitrite: NEGATIVE
Specific Gravity, Urine: 1.008 (ref 1.005–1.030)
Urobilinogen, UA: 0.2 mg/dL (ref 0.0–1.0)
pH: 6.5 (ref 5.0–8.0)

## 2011-06-10 LAB — POCT I-STAT, CHEM 8
BUN: 8 mg/dL (ref 6–23)
Calcium, Ion: 1.47 mmol/L — ABNORMAL HIGH (ref 1.12–1.32)
Creatinine, Ser: 1.4 mg/dL — ABNORMAL HIGH (ref 0.50–1.10)
Hemoglobin: 12.6 g/dL (ref 12.0–15.0)
Sodium: 139 mEq/L (ref 135–145)
TCO2: 29 mmol/L (ref 0–100)

## 2011-06-10 LAB — COMPREHENSIVE METABOLIC PANEL
ALT: 15 U/L (ref 0–35)
Albumin: 3.5 g/dL (ref 3.5–5.2)
Alkaline Phosphatase: 48 U/L (ref 39–117)
Calcium: 11.4 mg/dL — ABNORMAL HIGH (ref 8.4–10.5)
GFR calc Af Amer: 54 mL/min — ABNORMAL LOW (ref >90)
Potassium: 4.3 mEq/L (ref 3.5–5.1)
Sodium: 139 mEq/L (ref 135–145)
Total Protein: 7.3 g/dL (ref 6.0–8.3)

## 2011-06-10 LAB — DIFFERENTIAL
Basophils Absolute: 0 10*3/uL (ref 0.0–0.1)
Basophils Relative: 0 % (ref 0–1)
Eosinophils Absolute: 0 10*3/uL (ref 0.0–0.7)
Eosinophils Relative: 0 % (ref 0–5)
Lymphs Abs: 1.5 10*3/uL (ref 0.7–4.0)
Neutrophils Relative %: 56 % (ref 43–77)

## 2011-06-10 MED ORDER — SODIUM CHLORIDE 0.9 % IV BOLUS (SEPSIS): 1000.0000 mL | Freq: Once | INTRAVENOUS | Status: DC

## 2011-06-10 NOTE — ED Notes (Signed)
The iv team just started her iv.  Up to br with help for a uine spec

## 2011-06-10 NOTE — Discharge Instructions (Signed)
Antibiotic-Associated Diarrhea You or your child's diarrhea is due to taking an antibiotic medicine. This is a common reaction to these drugs. It does not mean you are allergic to the medicine. The diarrhea is usually not severe. The stools will return to normal several days after completing the antibiotic treatment. If a diaper rash occurs, you can wash the irritated area carefully. Then apply a cream or an ointment to protect the skin. Normally it is best to complete the antibiotic treatment. To reduce symptoms avoid:  Apple and grape fruit juices.   Dairy products.   Beans.  Encourage plenty of clear fluids, such as water or sports drinks. Cultured dairy products such as yogurt may help restore normal intestinal bacteria. Medicines to control the diarrhea may help reduce symptoms. But these should not be used if the stool is bloody.  SEEK IMMEDIATE MEDICAL CARE IF:   Diarrhea does not improve after completing the antibiotic medicine.   You notice a fever, bloody stools, increased pain, or vomiting.  Document Released: 05/09/2004 Document Revised: 12/12/2010 Document Reviewed: 11/05/2007 [facility] Patient Information 2012 [place], [place].  Avoid nuts and seeds. Stop taking the Vicodin and just use Tylenol as needed for pain.

## 2011-06-10 NOTE — ED Notes (Signed)
Attempted PIV without success; IV team paged

## 2011-06-10 NOTE — ED Provider Notes (Signed)
History     CSN: [id number]  Arrival date & time 06/10/11  1154   First MD Initiated Contact with Patient 06/10/11 1332      Chief Complaint  Patient presents with  . Diarrhea  . Fatigue    (Consider location/radiation/quality/duration/timing/severity/associated sxs/prior treatment) HPI Comments: Since being diagnosed with diverticulitis on Friday and starting medication she states she is getting very tired and falling asleep even with sitting up. She has mild diffuse abdominal pain but states she is having no pain right now. Pain is now worse after eating. Also she's had multiple episodes of watery diarrhea. She has been able to eat without any vomiting.  Patient is a 71 y.o. female presenting with diarrhea. The history is provided by the patient.  Diarrhea The primary symptoms include fatigue, abdominal pain and diarrhea. Primary symptoms do not include fever, nausea, vomiting or dysuria. The illness began 2 days ago. The onset was gradual. The problem has not changed since onset. The fatigue began 2 days ago. The fatigue has been unchanged since its onset. The fatigue is worsened by nothing.  The illness does not include chills, anorexia, bloating or constipation. Significant associated medical issues include diverticulitis. Risk factors: Started Vicodin, Augmentin, Zofran on Friday.    Past Medical History  Diagnosis Date  . Depression   . Gout   . Bipolar 1 disorder   . Schizoaffective disorder   . Chronic kidney disease   . Hypertension   . Seizure disorder   . Thrombocytopenia   . Anemia     Past Surgical History  Procedure Date  . Tubal ligation   . Appendectomy     No family history on file.  History  Substance Use Topics  . Smoking status: Never Smoker   . Smokeless tobacco: Never Used  . Alcohol Use: No    OB History    Grav Para Term Preterm Abortions TAB SAB Ect Mult Living                  Review of Systems  Constitutional: Positive for  fatigue. Negative for fever and chills.  Gastrointestinal: Positive for abdominal pain and diarrhea. Negative for nausea, vomiting, constipation, bloating and anorexia.  Genitourinary: Negative for dysuria.  All other systems reviewed and are negative.    Allergies  Benztropine mesylate; Lisinopril; and Sulfonamide derivatives  Home Medications   Current Outpatient Rx  Name Route Sig Dispense Refill  . ACETAMINOPHEN 500 MG PO TABS Oral Take 500 mg by mouth every 6 (six) hours as needed. pain    . AMOXICILLIN-POT CLAVULANATE 875-125 MG PO TABS Oral Take 1 tablet by mouth 2 (two) times daily. For 10 days    . VITAMIN C 500 MG PO TABS Oral Take 500 mg by mouth daily.     . ASPIRIN 81 MG PO CHEW Oral Chew 81 mg by mouth daily.     . B COMPLEX PO TABS Oral Take 1 tablet by mouth daily.     [name] CALCIUM CARBONATE-VITAMIN D 600-400 MG-UNIT PO TABS Oral Take 1 tablet by mouth every evening.     [name] CLONAZEPAM 0.5 MG PO TABS Oral Take 0.5 mg by mouth at bedtime as needed. Anxiety    . DILTIAZEM HCL ER COATED BEADS 120 MG PO CP24 Oral Take 120 mg by mouth daily. please use in place of short acting cardizem    . DIVALPROEX SODIUM 500 MG PO TBEC Oral Take 500 mg by mouth 2 (two) times daily.     [name]  HYDROCODONE-ACETAMINOPHEN 5-500 MG PO TABS Oral Take 1-2 tablets by mouth every 6 (six) hours as needed. pain    . ADULT MULTIVITAMIN W/MINERALS CH Oral Take 1 tablet by mouth daily.    [name] FISH OIL 1000 MG PO CAPS Oral Take 1 capsule by mouth at bedtime.     [name] ONDANSETRON HCL 4 MG PO TABS Oral Take 4 mg by mouth every 6 (six) hours. For nausea    . RISPERIDONE 1 MG PO TABS Oral Take 0.5-1 mg by mouth 2 (two) times daily. .5mg  in the morning 1mg  at evening.    [name] VITAMIN E 400 UNITS PO CAPS Oral Take 400 Units by mouth daily.     . COLCHICINE 0.6 MG PO TABS Oral Take 0.6 mg by mouth every 2 (two) hours as needed. gout      BP 135/61  Pulse 83  Temp(Src) 97.4 F (36.3 C) (Oral)  Resp 12  SpO2  96%  Physical Exam  Nursing note and vitals reviewed. Constitutional: She is oriented to person, place, and time. She appears well-developed and well-nourished. No distress.  HENT:  Head: Normocephalic and atraumatic.  Mouth/Throat: Oropharynx is clear and moist.  Eyes: EOM are normal. Pupils are equal, round, and reactive to light.  Cardiovascular: Normal rate, regular rhythm, normal heart sounds and intact distal pulses.  Exam reveals no friction rub.   No murmur heard. Pulmonary/Chest: Effort normal. No respiratory distress. She has no wheezes. She has no rales.  Abdominal: Soft. Bowel sounds are normal. She exhibits no distension. There is tenderness in the right lower quadrant and left lower quadrant. There is no rebound, no guarding and no CVA tenderness.    Musculoskeletal: Normal range of motion. She exhibits no edema and no tenderness.       No edema  Neurological: She is alert and oriented to person, place, and time. No cranial nerve deficit.  Skin: Skin is warm and dry. No rash noted.  Psychiatric: She has a normal mood and affect. Her behavior is normal.    ED Course  Procedures (including critical care time)  Labs Reviewed  CBC - Abnormal; Notable for the following:    Hemoglobin 11.4 (*)    HCT 35.1 (*)    RDW 16.0 (*)    Platelets 134 (*)    All other components within normal limits  POCT I-STAT, CHEM 8 - Abnormal; Notable for the following:    Creatinine, Ser 1.40 (*)    Calcium, Ion 1.47 (*)    All other components within normal limits  CBC - Abnormal; Notable for the following:    HCT 35.5 (*)    RDW 15.8 (*)    Platelets 125 (*)    All other components within normal limits  COMPREHENSIVE METABOLIC PANEL - Abnormal; Notable for the following:    Glucose, Bld 105 (*)    Creatinine, Ser 1.16 (*)    Calcium 11.4 (*)    Total Bilirubin 0.2 (*)    GFR calc non Af Amer 46 (*)    GFR calc Af Amer 54 (*)    All other components within normal limits   DIFFERENTIAL  LIPASE, BLOOD  URINALYSIS, ROUTINE W REFLEX MICROSCOPIC   No results found.   No diagnosis found.    MDM   Patient was seen was a long Friday after having abdominal pain and diagnosed with diverticulitis and UTI. She's been taking Augmentin, Zofran, Vicodin. She states that she gets very sleepy and spaces out. She's  also having multiple loose stools but denies any abdominal pain currently and is able to tolerate orals. She states she came in today because she was so tired I would not sleep. Discussed with her that Vicodin can cause those symptoms and she has been taking the Vicodin as needed for pain. She has mild tenderness on exam but otherwise no concerning symptoms. She is awake and alert and has normal vital signs. CBC, CMP, lipase, UA pending. Will give patient IV fluids and observation for a period of time.  All labs within normal limits. Patient feeling better after fluids. She states she feels good enough to go home. We'll followup with her doctor later this week and knows to return if she develops any vomiting, severe abdominal pain or other concerns.        Gwyneth Sprout, MD 06/10/11 1651

## 2011-06-10 NOTE — ED Notes (Signed)
Patient was seen at [facility] on Friday,  Dx with diverticulitis and uti.  Patient states she has had diarrhea and feeling tired/sleepiness.  Patient states she just stares off into space at times.  Patient denies pain.  Patient states she is having multiple loose stools,  3 times today

## 2011-06-13 ENCOUNTER — Ambulatory Visit (INDEPENDENT_AMBULATORY_CARE_PROVIDER_SITE_OTHER): Payer: Medicare Other | Admitting: Family Medicine

## 2011-06-13 ENCOUNTER — Encounter: Payer: Self-pay | Admitting: Family Medicine

## 2011-06-13 VITALS — BP 128/80 | HR 88 | Temp 98.3°F | Ht 63.0 in | Wt 148.5 lb

## 2011-06-13 DIAGNOSIS — K5732 Diverticulitis of large intestine without perforation or abscess without bleeding: Secondary | ICD-10-CM

## 2011-06-13 DIAGNOSIS — K5792 Diverticulitis of intestine, part unspecified, without perforation or abscess without bleeding: Secondary | ICD-10-CM

## 2011-06-13 MED ORDER — BACID PO TABS: 2.0000 | ORAL_TABLET | Freq: Three times a day (TID) | ORAL | Status: DC

## 2011-06-13 NOTE — Patient Instructions (Signed)
Your diarrhea may be caused by antibiotic or zofran. OK to stop zofran if not having nausea. May benefit from pro-biotic at drug store. Keep drinking plenty of fluids, soup, but avoid extra-sugary things-may give diarrhea. If you have any fevers, abdominal pain, vomiting, pain with urination then seek medical care. Stop taking vicodin and zofran.  OK to try probiotic if still having diarrhea.

## 2011-06-15 NOTE — Progress Notes (Signed)
  Subjective:    Patient ID: [name], female    DOB: 1941/01/01, 71 y.o.   MRN: [id number]  HPI   1. Hospital follow up. Seen twice in ED this week. Treated for diverticulitis and UTI. Taking augmentin, zofran and was on vicodin but apparently she returned to ED for confusion, weakness. After she stopped the narcotic her mental status issues resolved. Currently she is pain free, but had diarrhea yesterday. No BM yet today. Denies nausea, emesis, fevers, syncope, pain, confusion, dysuria. She has been drinking fluids plenty, but slow on starting diet.  Review of Systems See HPI otherwise negative.    Objective:   Physical Exam  Vitals reviewed. Constitutional: She is oriented to person, place, and time. She appears well-developed and well-nourished. No distress.  HENT:  Head: Normocephalic and atraumatic.  Eyes: EOM are normal. Pupils are equal, round, and reactive to light.  Cardiovascular: Normal rate, regular rhythm, normal heart sounds and intact distal pulses.  Exam reveals no gallop.   No murmur heard. Pulmonary/Chest: Effort normal and breath sounds normal.  Abdominal: Soft. Bowel sounds are normal. She exhibits no distension. There is no tenderness. There is no guarding.  Musculoskeletal: She exhibits no edema.  Neurological: She is alert and oriented to person, place, and time.  Psychiatric: She has a normal mood and affect.       Speech and behavior normal.      Assessment & Plan:   1. Diverticulitis. Symptoms resolved on augmentin. Volume status is euvolemic today.  Advised to finish course as prescribed. Will DC zofran as she has no nausea and may contribute to diarrhea in addition to abx. No signs of infectious diarrhea or c diff and no BM yet today. Encourage fluids and advancement of diet as tolerated slowly. Follow up if symptoms return or worsen, fevers or emesis.

## 2011-06-24 ENCOUNTER — Ambulatory Visit: Payer: Medicare Other | Admitting: Family Medicine

## 2011-07-05 ENCOUNTER — Telehealth: Payer: Self-pay | Admitting: Family Medicine

## 2011-07-05 NOTE — Telephone Encounter (Signed)
Patient has an appt on Tuesday, but is concerned that she isn't having enough urine output and wants to know what she needs to do until her appt.

## 2011-07-05 NOTE — Telephone Encounter (Signed)
Patient denies any dysuria . States at night she has forceful urine and can hardly get out of bed before it come. During day she goes the same amount of times but urine  flow is slow she states.  Advised to increase fluid intake.  If she is very concerned over the weekend will need to go to Urgent Care . She voices understanding.

## 2011-07-09 ENCOUNTER — Encounter: Payer: Self-pay | Admitting: Family Medicine

## 2011-07-09 ENCOUNTER — Ambulatory Visit (INDEPENDENT_AMBULATORY_CARE_PROVIDER_SITE_OTHER): Payer: Medicare Other | Admitting: Family Medicine

## 2011-07-09 VITALS — BP 138/75 | HR 71 | Temp 97.8°F | Ht 63.0 in | Wt 147.4 lb

## 2011-07-09 DIAGNOSIS — I10 Essential (primary) hypertension: Secondary | ICD-10-CM | POA: Diagnosis not present

## 2011-07-09 DIAGNOSIS — E7800 Pure hypercholesterolemia, unspecified: Secondary | ICD-10-CM

## 2011-07-09 DIAGNOSIS — R269 Unspecified abnormalities of gait and mobility: Secondary | ICD-10-CM

## 2011-07-09 DIAGNOSIS — N393 Stress incontinence (female) (male): Secondary | ICD-10-CM

## 2011-07-09 NOTE — Patient Instructions (Signed)
Nice to see you again. We do not need to check urine unless you have stomach pain, pain with urination, fevers, leaking urine. I made home health referral for aide. Make an appointment for lab check, while you are fasting to check cholesterol. Make an appointment for check up in 3 months or sooner if needed.   Cholesterol Cholesterol is a type of fat. Your body needs a small amount of cholesterol, but too much can cause health problems. Certain problems include heart attacks, strokes, and not enough blood flow to your heart, brain, kidneys, or feet. You get cholesterol in 2 ways:  Naturally.   By eating certain foods.  HOME CARE  Eat a low-fat diet:   Eat less eggs, whole dairy products (whole milk, cheese, and butter), fatty meats, and fried foods.   Eat more fruits, vegetables, whole-wheat breads, lean chicken, and fish.   Follow your exercise program as told by your doctor.   Keep your weight at a healthy level. Talk to your doctor about what is right for you.   Only take medicine as told by your doctor.   Get your cholesterol checked once a year or as told by your doctor.  MAKE SURE YOU:  Understand these instructions.   Will watch your condition.   Will get help right away if you are not doing well or get worse.  Document Released: 06/28/2008 Document Revised: 03/21/2011 Document Reviewed: 06/28/2008 [facility] Patient Information 2012 [place], [place].

## 2011-07-09 NOTE — Assessment & Plan Note (Addendum)
At goal today, no changes. Repeat labs in  6 months.

## 2011-07-09 NOTE — Assessment & Plan Note (Signed)
Stable gait with walker. Patient is undergoing wean of risperdal per psychiatrist. Still attempting to get home aide services.

## 2011-07-09 NOTE — Assessment & Plan Note (Signed)
Today gives more of a chronic urge incontinence picture. Using depends briefs. Has dysuria very infrequently that is more likely related to vaginal atrophy. No signs or symptoms of UTI, discussed with patient avoidance of extra tests and antibiotic treatment that may be unnecessary.

## 2011-07-09 NOTE — Progress Notes (Signed)
  Subjective:    Patient ID: [name], female    DOB: 01/08/1941, 71 y.o.   MRN: [id number]  HPI  1. HLD. Due for lipid check, but not fasting today. Stays on low cholesterol diet and takes fatty acids. Agrees to follow up for fasting labs. Denies chest pain.   2. Hx UTI. Patient questions if she needs her urine checked again. Had ED visit and told she had UTI, culture had insignficant growth. She states that she does have some mild burning with urination but this happens inconsistently-last was several weeks ago. Denies any current dysuria, abdominal pain, fever, chills, leakage of urine, nausea. She does have long term urge incontinence.   3. Social needs. Continues to search for home aide. In the past she desires help around the house, as she is self sufficient with all IADLs that we discuss (except for transporation). Uses a walker. I have filled out medical information onto a home aide agency for patient, she has undergone initial evaluation pending approval. Denies falls.   Review of Systems See hpi otherwise negative.     Objective:   Physical Exam  Vitals reviewed. Constitutional: She is oriented to person, place, and time. She appears well-developed and well-nourished. No distress.  HENT:  Head: Normocephalic and atraumatic.  Eyes: EOM are normal.  Cardiovascular: Normal rate, regular rhythm and normal heart sounds.   No murmur heard. Pulmonary/Chest: Effort normal and breath sounds normal. No respiratory distress. She has no wheezes.  Abdominal: Soft. Bowel sounds are normal. She exhibits no distension. There is no tenderness. There is no rebound and no guarding.  Musculoskeletal: She exhibits no edema.  Neurological: She is alert and oriented to person, place, and time. Coordination normal.       Uses walker for ambulation  Skin: No rash noted.  Psychiatric: She has a normal mood and affect.       Assessment & Plan:

## 2011-07-09 NOTE — Assessment & Plan Note (Signed)
Patient to schedule FLP.

## 2011-07-18 DIAGNOSIS — F319 Bipolar disorder, unspecified: Secondary | ICD-10-CM | POA: Diagnosis not present

## 2011-07-23 ENCOUNTER — Encounter: Payer: Self-pay | Admitting: Home Health Services

## 2011-07-24 ENCOUNTER — Other Ambulatory Visit: Payer: Medicare Other

## 2011-07-24 ENCOUNTER — Telehealth: Payer: Self-pay | Admitting: *Deleted

## 2011-07-24 DIAGNOSIS — E7800 Pure hypercholesterolemia, unspecified: Secondary | ICD-10-CM

## 2011-07-24 LAB — LIPID PANEL
Cholesterol: 194 mg/dL (ref 0–200)
HDL: 66 mg/dL (ref >39)
LDL Cholesterol: 108 mg/dL — ABNORMAL HIGH (ref 0–99)
Triglycerides: 99 mg/dL (ref <150)
VLDL: 20 mg/dL (ref 0–40)

## 2011-07-24 NOTE — Telephone Encounter (Addendum)
Patient here for lab visit.  She receives home health services through [facility] [phone] (41 hours per month---2.5 hrs daily M-F).  Copy of home health brochure placed in Dr. Ernest Haber box.  Patient receives assistance for cleaning, dressing, cooking, and exercises.  Patient states she is a "high fall risk" and wants to know if hours can be increased to 80 hours per month---4 hours daily M-F).  Will send note to Dr. Cristal Ford and call patient back.  Gaylene Brooks, RN

## 2011-07-25 NOTE — Telephone Encounter (Signed)
Called pt left VM. i did not see any brochure. I stated on message that I would prefer to keep patient as independent as possible, and not give her extra help if she didn't truly need it. She still seems rather independent to me during our visit. Ok to discuss during next visit.

## 2011-07-25 NOTE — Telephone Encounter (Signed)
Patient is calling back because she wants 80 hours for her Aide through [facility].  She would like for Dr. Cristal Ford to approve it.

## 2011-07-29 NOTE — Telephone Encounter (Signed)
Called patient again. She is happy with services, but the home aide requested and patient thinks she may want even more help with cleaning, cooking, laundry (desires increase to 80 hr/week). Prior to few weeks ago, patient was managing to get her tasks done with the help of "people just passing by." Her main concern is weakness in legs, getting around places. Ambulates with a walker. I informed patient my concern that if she has someone perform all her tasks daily, she may lose what ability and strength she currently has. I would prefer she continue her household tasks and ambulatory capacity as much as possible.  I have denied approval for additional hours. Patient agrees this is acceptable.

## 2011-08-13 DIAGNOSIS — B351 Tinea unguium: Secondary | ICD-10-CM | POA: Diagnosis not present

## 2011-08-13 DIAGNOSIS — M79609 Pain in unspecified limb: Secondary | ICD-10-CM | POA: Diagnosis not present

## 2011-09-18 DIAGNOSIS — F319 Bipolar disorder, unspecified: Secondary | ICD-10-CM | POA: Diagnosis not present

## 2011-09-27 ENCOUNTER — Emergency Department (INDEPENDENT_AMBULATORY_CARE_PROVIDER_SITE_OTHER): Payer: Medicare Other

## 2011-09-27 ENCOUNTER — Emergency Department (INDEPENDENT_AMBULATORY_CARE_PROVIDER_SITE_OTHER)
Admission: EM | Admit: 2011-09-27 | Discharge: 2011-09-27 | Disposition: A | Discharge status: Home / Self Care | Payer: Medicare Other | Source: Home / Self Care | Attending: Emergency Medicine | Admitting: Emergency Medicine

## 2011-09-27 ENCOUNTER — Encounter (HOSPITAL_COMMUNITY): Payer: Self-pay

## 2011-09-27 ENCOUNTER — Emergency Department (HOSPITAL_COMMUNITY): Payer: Medicare Other

## 2011-09-27 DIAGNOSIS — T148XXA Other injury of unspecified body region, initial encounter: Secondary | ICD-10-CM

## 2011-09-27 DIAGNOSIS — M7989 Other specified soft tissue disorders: Secondary | ICD-10-CM | POA: Diagnosis not present

## 2011-09-27 DIAGNOSIS — M79609 Pain in unspecified limb: Secondary | ICD-10-CM | POA: Diagnosis not present

## 2011-09-27 DIAGNOSIS — S9030XA Contusion of unspecified foot, initial encounter: Secondary | ICD-10-CM | POA: Diagnosis not present

## 2011-09-27 NOTE — ED Provider Notes (Signed)
Chief Complaint  Patient presents with  . Foot Pain    History of Present Illness:   The patient is a 71 year old female who injured her right foot today while walking. She stepped in a hole in the street and turned her ankle. Ever since then she's had swelling and pain over the dorsum of the foot. She denies any numbness or tingling. She is able to ambulate. She is able to move all her toes and her ankle.  Review of Systems:  Other than noted above, the patient denies any of the following symptoms: Systemic:  No fevers, chills, sweats, or aches.  No fatigue or tiredness. Musculoskeletal:  No joint pain, arthritis, bursitis, swelling, back pain, or neck pain. Neurological:  No muscular weakness, paresthesias, headache, or trouble with speech or coordination.  No dizziness.   PMFSH:  Past medical history, family history, social history, meds, and allergies were reviewed.  Physical Exam:   Vital signs:  BP 137/67  Pulse 81  Temp 97.9 F (36.6 C) (Oral)  Resp 17  SpO2 100% Gen:  Alert and oriented times 3.  In no distress. Musculoskeletal: She has a large hematoma over the dorsal lateral aspect of the right foot. This was very tender to palpation. She is able to move her toes well. He'll pulses were full. There was no pain over the ankle. The ankle has a full range of motion with no pain. Otherwise, all joints had a full a ROM with no swelling, bruising or deformity.  No edema, pulses full. Extremities were warm and pink.  Capillary refill was brisk.  Skin:  Clear, warm and dry.  No rash. Neuro:  Alert and oriented times 3.  Muscle strength was normal.  Sensation was intact to light touch.     Radiology:  Dg Foot Complete Right  09/27/2011  *RADIOLOGY REPORT*  Clinical Data: Twisting injury.  Lateral foot pain, swelling and bruising.  History of gout.  RIGHT FOOT COMPLETE - 3+ VIEW  Comparison: Radiographs 02/23/2007.  Findings: The mineralization and alignment are normal.  There is no  evidence of acute fracture or dislocation.  Chronic degenerative changes at the first metatarsal phalangeal joint appear minimally progressive.  Mild interphalangeal degenerative changes are stable. Vascular calcifications are noted.  There is no focal soft tissue swelling.  IMPRESSION: No acute osseous findings or focal soft tissue swelling.  Stable to mildly progressive degenerative changes.  Original Report Authenticated By: Gerrianne Scale, M.D.    Assessment:  The encounter diagnosis was Hematoma.  Plan:   1.  The following meds were prescribed:   New Prescriptions   No medications on file   2.  The patient was instructed in symptomatic care, including rest and activity, elevation, application of ice and compression.  Appropriate handouts were given. 3.  The patient was told to return if becoming worse in any way, if no better in 3 or 4 days, and given some red flag symptoms that would indicate earlier return.   4.  The patient was told to follow up here if no improvement in 2 weeks.   Reuben Likes, MD 09/27/11 2141

## 2011-09-27 NOTE — ED Notes (Signed)
Reports pain in her right foot w bruise after walk this AM; does not recall trauma; good pulse dorsal , swelling present (always present)

## 2011-09-27 NOTE — Discharge Instructions (Signed)

## 2011-09-29 ENCOUNTER — Telehealth: Payer: Self-pay | Admitting: Family Medicine

## 2011-09-29 NOTE — Telephone Encounter (Signed)
Pt seen in ED Fri.  Problems with hematoma over right lateral foot following stepping wrong.  Sent home.  Now with hematoma extension down to toes on lateral side.  No fevers/chills/drainage, no decrease in movement.  Advised compression, elevation, and call clinic in AM for appointment to be seen.

## 2011-09-30 ENCOUNTER — Ambulatory Visit (INDEPENDENT_AMBULATORY_CARE_PROVIDER_SITE_OTHER): Payer: Medicare Other | Admitting: Family Medicine

## 2011-09-30 ENCOUNTER — Encounter: Payer: Self-pay | Admitting: Family Medicine

## 2011-09-30 VITALS — BP 138/68 | HR 82 | Temp 98.1°F | Ht 63.0 in | Wt 143.0 lb

## 2011-09-30 DIAGNOSIS — T148XXA Other injury of unspecified body region, initial encounter: Secondary | ICD-10-CM | POA: Diagnosis not present

## 2011-09-30 DIAGNOSIS — S93401A Sprain of unspecified ligament of right ankle, initial encounter: Secondary | ICD-10-CM

## 2011-09-30 DIAGNOSIS — S93409A Sprain of unspecified ligament of unspecified ankle, initial encounter: Secondary | ICD-10-CM | POA: Diagnosis not present

## 2011-09-30 NOTE — Patient Instructions (Addendum)
Your foot seems swollen from a sprain, x-rays were negative. Keep wrapping the foot for support. You may pick up a ankle brace at the drug store for support. Keep leg elevated when you are at home. You may take ibuprofen (motrin) 600mg  2-3 times daily as needed for pain. Take with food. Keep icing the area.  If pain worsens or swelling spreads up leg then return to doctor.  Sprain A sprain happens when the bands of tissue that connect bones and hold joints together (ligaments) stretch too much or tear. HOME CARE  Raise (elevate) the injured area to lessen puffiness (swelling).   Put ice on the injured area.   Put ice in a plastic bag.   Place a towel between your skin and the bag.   Leave the ice on for 15 to 20 minutes, 3 to 4 times a day.   Do this for the first 24 hours or as told by your doctor.   Wear any splints, braces, castings, or elastic wraps as told by your child's doctor.   Eat healthy foods.   Only take medicine as told by your doctor.  GET HELP RIGHT AWAY IF:   There is numbness or tingling in the injured limb.   The toes or fingers become blue or white in the injured limb.   The sprained limb is cold to the touch.   There is a sharp, shooting pain in the injured limb.   The puffiness is getting worse instead of better.  MAKE SURE YOU:   Understand these instructions.   Will watch this condition.   Will get help right away if you are not doing well or get worse.  Document Released: 09/18/2007 Document Revised: 03/21/2011 Document Reviewed: 02/15/2009 [facility] Patient Information 2012 [place], [place].

## 2011-10-01 DIAGNOSIS — S93401A Sprain of unspecified ligament of right ankle, initial encounter: Secondary | ICD-10-CM | POA: Insufficient documentation

## 2011-10-01 NOTE — Progress Notes (Signed)
  Subjective:    Patient ID: [name], female    DOB: 01/26/1941, 71 y.o.   MRN: [id number]  HPI  1. Foot swelling. Patient had an ankle sprain 3 days ago. She rolled her right ankle laterally after stepping into a hole. She presented to UC initially due to pain and swelling. Plain film negative for fracture. She states the pain has improved. She had swelling in the ankle itself, now the fluid seems to have migrated down to the forefoot and toes. She is ambulatory and wearing an ACE bandage for support. Taking tylenol prn.   Denies pain with ambulation, worsening swelling, calf pain/redness.   Review of Systems See HPI otherwise negative.  reports that she has never smoked. She has never used smokeless tobacco.    Objective:   Physical Exam  Vitals reviewed. Constitutional: She is oriented to person, place, and time. She appears well-developed and well-nourished. No distress.  HENT:  Head: Normocephalic and atraumatic.  Eyes: EOM are normal.  Pulmonary/Chest: Effort normal.  Musculoskeletal:       Right ankle without effusion. Lateral forefoot with small amount of general edema. Scant ecchymosis in metatarsal area. No tenderness to palpation except in ATFL area.  Normal ambulation, ROM.  No warmth or redness. No edema in calf.   Neurological: She is alert and oriented to person, place, and time. She exhibits normal muscle tone. Coordination normal.       Assessment & Plan:

## 2011-10-01 NOTE — Assessment & Plan Note (Signed)
Pain has improved, reviewed negative plain film. Edema seems to have migrated down with gravity. No signs concerning for worsening or complications such as DVT, she is ambulatory and pain free. Recommend patient obtain an ankle supportive device for prevention of recurrent sprain. May use short course motrin for edema. Continue elevation of leg and ice TID. Reassured regarding slow improvement over days-weeks.

## 2011-10-29 ENCOUNTER — Telehealth: Payer: Self-pay | Admitting: Family Medicine

## 2011-10-29 ENCOUNTER — Emergency Department (HOSPITAL_COMMUNITY)
Admission: EM | Admit: 2011-10-29 | Discharge: 2011-10-29 | Disposition: A | Discharge status: Home / Self Care | Payer: Medicare Other | Attending: Emergency Medicine | Admitting: Emergency Medicine

## 2011-10-29 ENCOUNTER — Emergency Department (HOSPITAL_COMMUNITY): Payer: Medicare Other

## 2011-10-29 DIAGNOSIS — M19079 Primary osteoarthritis, unspecified ankle and foot: Secondary | ICD-10-CM | POA: Diagnosis not present

## 2011-10-29 DIAGNOSIS — Z9089 Acquired absence of other organs: Secondary | ICD-10-CM | POA: Diagnosis not present

## 2011-10-29 DIAGNOSIS — I129 Hypertensive chronic kidney disease with stage 1 through stage 4 chronic kidney disease, or unspecified chronic kidney disease: Secondary | ICD-10-CM | POA: Diagnosis not present

## 2011-10-29 DIAGNOSIS — N189 Chronic kidney disease, unspecified: Secondary | ICD-10-CM | POA: Diagnosis not present

## 2011-10-29 DIAGNOSIS — M25579 Pain in unspecified ankle and joints of unspecified foot: Secondary | ICD-10-CM | POA: Diagnosis not present

## 2011-10-29 DIAGNOSIS — M79671 Pain in right foot: Secondary | ICD-10-CM

## 2011-10-29 DIAGNOSIS — Z79899 Other long term (current) drug therapy: Secondary | ICD-10-CM | POA: Diagnosis not present

## 2011-10-29 DIAGNOSIS — F319 Bipolar disorder, unspecified: Secondary | ICD-10-CM | POA: Diagnosis not present

## 2011-10-29 DIAGNOSIS — M79609 Pain in unspecified limb: Secondary | ICD-10-CM | POA: Diagnosis not present

## 2011-10-29 MED ORDER — TRAMADOL HCL 50 MG PO TABS: 50.0000 mg | ORAL_TABLET | Freq: Two times a day (BID) | ORAL | Status: AC

## 2011-10-29 NOTE — ED Notes (Signed)
Pt d/c home in NAD. PT able to ambulate with quick steady gait with use of walker. Pt voiced gratitude for staff care.

## 2011-10-29 NOTE — ED Notes (Addendum)
Pt c/o R foot pain, In early part of June, pt stepped in pothole today and twisted it- pt reports that has been having intermittent pain since and swelling; pt reports yesterday pain got as worse as it has ever been- pt has good movement and sensation

## 2011-10-29 NOTE — Telephone Encounter (Signed)
Patient reports that she injured her ankle in mid June.  Has had problems with a hematoma and subsequent spreading of the discoloration down her foot and into her toes.  She was seen in clinic in the middle of June and conservative treatment was recommended.  She was doing well until today when she noted that she had increased swelling and a significant increase in pain.  She has not been having fevers, denies shortness of breath, and has preserved sensation in legs and preserved mobility   Pain is a worrisome sign, especially in the absence of a new injury.  Recommended that she go to urgent care to be evaluated to make certain this is not some type of new-onset thrombo-embolic phenomenon.

## 2011-10-29 NOTE — ED Provider Notes (Signed)
History     CSN: [id number]  Arrival date & time 10/29/11  4098   First MD Initiated Contact with Patient 10/29/11 2009      Chief Complaint  Patient presents with  . Foot Pain    (Consider location/radiation/quality/duration/timing/severity/associated sxs/prior treatment) HPI Comments: PAtient injuries R foot in June had negative xrays and was told to take Motirn PRN which she has been doin g but it is nt getting better Was seen by her PCP 3 weeks ago with same recommendations  She is wearing an ACE as well as Post Op Boot and using a walker from a previous knee injury   Patient is a 71 y.o. female presenting with lower extremity pain.  Foot Pain Associated symptoms include joint swelling. Pertinent negatives include no fever.    Past Medical History  Diagnosis Date  . Depression   . Gout   . Bipolar 1 disorder   . Schizoaffective disorder   . Chronic kidney disease   . Hypertension   . Seizure disorder   . Thrombocytopenia   . Anemia     Past Surgical History  Procedure Date  . Tubal ligation   . Appendectomy     No family history on file.  History  Substance Use Topics  . Smoking status: Never Smoker   . Smokeless tobacco: Never Used  . Alcohol Use: No    OB History    Grav Para Term Preterm Abortions TAB SAB Ect Mult Living                  Review of Systems  Constitutional: Negative for fever.  Musculoskeletal: Positive for joint swelling.    Allergies  Benztropine mesylate; Lisinopril; and Sulfonamide derivatives  Home Medications   Current Outpatient Rx  Name Route Sig Dispense Refill  . ACETAMINOPHEN 500 MG PO TABS Oral Take 500 mg by mouth every 6 (six) hours as needed. pain    . VITAMIN C 500 MG PO TABS Oral Take 500 mg by mouth daily.     . ASPIRIN 81 MG PO CHEW Oral Chew 81 mg by mouth daily.     . B COMPLEX PO TABS Oral Take 1 tablet by mouth daily.     [name] CALCIUM CARBONATE-VITAMIN D 600-400 MG-UNIT PO TABS Oral Take 1 tablet by  mouth every evening.     [name] CLONAZEPAM 0.5 MG PO TABS Oral Take 0.5 mg by mouth at bedtime as needed. Anxiety    . COLCHICINE 0.6 MG PO TABS Oral Take 0.6 mg by mouth every 2 (two) hours as needed. gout    . DILTIAZEM HCL ER COATED BEADS 120 MG PO CP24 Oral Take 120 mg by mouth daily. please use in place of short acting cardizem    . DIVALPROEX SODIUM 500 MG PO TBEC Oral Take 500 mg by mouth 2 (two) times daily.     [name] FLAXSEED OIL PO Oral Take 1 capsule by mouth daily.    . ADULT MULTIVITAMIN W/MINERALS CH Oral Take 1 tablet by mouth daily.    [name] FISH OIL 1000 MG PO CAPS Oral Take 1 capsule by mouth at bedtime.     [name] RISPERIDONE 1 MG PO TABS Oral Take 0.5-1 mg by mouth 2 (two) times daily. 0.5mg  in the morning 1mg  at evening.    [name] VITAMIN E 400 UNITS PO CAPS Oral Take 400 Units by mouth daily.     . TRAMADOL HCL 50 MG PO TABS Oral Take 1 tablet (50  mg total) by mouth 2 (two) times daily. 15 tablet 0    BP 161/83  Pulse 88  Temp 98.5 F (36.9 C) (Oral)  Resp 18  SpO2 98%  Physical Exam  Constitutional: She appears well-developed.  Eyes: Pupils are equal, round, and reactive to light.  Neck: Normal range of motion.  Cardiovascular: Normal rate.   Musculoskeletal: Normal range of motion. She exhibits tenderness. She exhibits no edema.       Feet:    ED Course  Procedures (including critical care time)  Labs Reviewed - No data to display Dg Foot Complete Right  10/29/2011  *RADIOLOGY REPORT*  Clinical Data: Right foot pain.  RIGHT FOOT COMPLETE - 3+ VIEW  Comparison: 09/27/2011  Findings: Stable diffuse degenerative changes of the toes.  No evidence of acute fracture or dislocation.  No erosions or bony destruction.  Soft tissues are unremarkable.  IMPRESSION: Stable degenerative changes of the toes.  No acute findings.  Original Report Authenticated By: Reola Calkins, M.D.     1. Foot pain, right       MDM  Will re xray looking at base of 5th metartsal X-rays reviewed by  me and there is no obvious calcification from a healing fracture.  Reviewed x-rays from 09/27/2011 as well, and there is no distinct, changes.  We'll continue with Ace wrap, postop boot and I will increase her pain medication to all TRAM twice a day, with followup with her primary care physician for referral for physical therapy        Arman Filter, NP 10/29/11 2040  Arman Filter, NP 10/29/11 2111

## 2011-10-29 NOTE — ED Notes (Signed)
The pt is c/o some  Rt foot pain she stepped in a pot hole sometime in June and was seen  At [facility]  Initially she is still having pain and the urgent care was closed

## 2011-10-30 ENCOUNTER — Ambulatory Visit (INDEPENDENT_AMBULATORY_CARE_PROVIDER_SITE_OTHER): Payer: Medicare Other | Admitting: Family Medicine

## 2011-10-30 VITALS — BP 145/72 | HR 89 | Temp 97.6°F | Wt 146.0 lb

## 2011-10-30 DIAGNOSIS — S93401A Sprain of unspecified ligament of right ankle, initial encounter: Secondary | ICD-10-CM

## 2011-10-30 DIAGNOSIS — S93409A Sprain of unspecified ligament of unspecified ankle, initial encounter: Secondary | ICD-10-CM | POA: Diagnosis not present

## 2011-10-30 NOTE — Progress Notes (Signed)
Subjective:     Patient ID: [name], female   DOB: [birth year], 71 y.o.   MRN: [id number]  HPI Patient complains of right foot pain after stepping in a pothole a few weeks ago.  Comes in today for continued pain in lateral foot.  Is walking fairly well with her chronic walker.  Tried an ankle sleeve but kept on all the time and became to [place]. Tenderness noted on the lateral dorsum of the foot and distal to the lateral malleolus. No  Fever sob chest pain .  The edema around her ankle decreses overnight   Review of Systems Cardiovascular - patient denies new chest pain or palpitations. Respiratory - patient denies new shortness of breath. MSK - patient reports pain with walking and point tenderness on the lateral aspect of the right foot/ankle.    Objective:   Physical Exam MSK - Point tenderness distal to the right lateral malleolus and right dorsum of foot.  No derformity Patient has no pain with dorsiflexion, plantarflexion inversion or eversion with no resistance.  Mild pain elicited with inversion against resistance.  The patient has a steady gait and is able to walk bearing full weight with little difficulty using her regular walker.  Mild edema appreciated in the toes. Calf circumference are equal bilaterally 1 + pitting edema 4-5 cm above ankle    Assessment:         Plan:

## 2011-10-30 NOTE — Patient Instructions (Addendum)
It was nice to meet you today!  Please write the alphabet with your right ankle three times daily in order to strengthen the muscles that support the ankle.  Visit [facility] on [facility] for an ankle sleeve to support your ankle.  Only wear the sleeve for support while you are walking and take it off when you are at rest.  For pain management, take acetominophen (tylenol) as need for pain.  If tylenol does not control your pain, then try naproxen (aleve).  Elevate the ankle when sitting to help with swelling and apply ice for 20 minutes 2 times daily.  Follow up in 1 month to review current medical conditions with Dr. Cristal Ford.

## 2011-10-30 NOTE — Assessment & Plan Note (Addendum)
Right ankle/foot pain:  Ankle sprain with no evidence of infection, fracture or gout flare. Manage with analgesics, ice, elevation, compression and strengthening exercises. Ankle sleeve prescribed for support when walking Take off and elevate foot when at rest Follow up in 4 weeks with Dr. Cristal Ford

## 2011-10-30 NOTE — ED Provider Notes (Signed)
Medical screening examination/treatment/procedure(s) were performed by non-physician practitioner and as supervising physician I was immediately available for consultation/collaboration.   Parag Dorton M Karver Fadden, DO 10/30/11 1405 

## 2011-11-01 ENCOUNTER — Telehealth: Payer: Self-pay | Admitting: Family Medicine

## 2011-11-01 NOTE — Telephone Encounter (Signed)
Caregiver is calling requesting an order for a transfer shower chair/and bars sent to [facility].

## 2011-11-01 NOTE — Telephone Encounter (Signed)
Spoke with patient and they will need a script for [facility] written about this. I informed her that you will not be back until next week, she agreed to this

## 2011-11-01 NOTE — Telephone Encounter (Signed)
These orders are acceptable. If written order is required, I can do this next week when I return.

## 2011-11-04 ENCOUNTER — Ambulatory Visit (INDEPENDENT_AMBULATORY_CARE_PROVIDER_SITE_OTHER): Payer: Medicare Other | Admitting: Family Medicine

## 2011-11-04 ENCOUNTER — Encounter: Payer: Self-pay | Admitting: Family Medicine

## 2011-11-04 VITALS — BP 155/79 | HR 90 | Ht 63.0 in | Wt 147.0 lb

## 2011-11-04 DIAGNOSIS — R109 Unspecified abdominal pain: Secondary | ICD-10-CM | POA: Diagnosis not present
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
## 2011-11-04 NOTE — Patient Instructions (Addendum)
I think you have a contusion (bruise) of your left side.  It will be stiff and sore for the next few days with the worst being in 3 days  If you are not better in 3-4 days or not back to normal in 3 weeks then come back  To strengthen your R knee do leg extension pushing heel down for the count of 5 3 times then three times daily   Stop the ankle brace and the big shoes

## 2011-11-04 NOTE — Telephone Encounter (Signed)
Placed rx in fax outbox to [facility].

## 2011-11-05 DIAGNOSIS — B351 Tinea unguium: Secondary | ICD-10-CM | POA: Diagnosis not present

## 2011-11-05 DIAGNOSIS — M79609 Pain in unspecified limb: Secondary | ICD-10-CM | POA: Diagnosis not present

## 2011-11-13 DIAGNOSIS — E782 Mixed hyperlipidemia: Secondary | ICD-10-CM | POA: Diagnosis not present

## 2011-11-13 DIAGNOSIS — Z79899 Other long term (current) drug therapy: Secondary | ICD-10-CM | POA: Diagnosis not present

## 2011-12-11 ENCOUNTER — Ambulatory Visit (INDEPENDENT_AMBULATORY_CARE_PROVIDER_SITE_OTHER): Payer: Medicare Other | Admitting: Family Medicine

## 2011-12-11 ENCOUNTER — Encounter: Payer: Self-pay | Admitting: Family Medicine

## 2011-12-11 VITALS — BP 148/65 | HR 79 | Temp 98.7°F | Wt 149.0 lb

## 2011-12-11 DIAGNOSIS — M25561 Pain in right knee: Secondary | ICD-10-CM

## 2011-12-11 DIAGNOSIS — M25569 Pain in unspecified knee: Secondary | ICD-10-CM | POA: Diagnosis not present

## 2011-12-11 DIAGNOSIS — S93401A Sprain of unspecified ligament of right ankle, initial encounter: Secondary | ICD-10-CM

## 2011-12-11 DIAGNOSIS — S93409A Sprain of unspecified ligament of unspecified ankle, initial encounter: Secondary | ICD-10-CM

## 2011-12-11 DIAGNOSIS — W19XXXA Unspecified fall, initial encounter: Secondary | ICD-10-CM

## 2011-12-11 DIAGNOSIS — T148XXA Other injury of unspecified body region, initial encounter: Secondary | ICD-10-CM | POA: Diagnosis not present

## 2011-12-11 NOTE — Patient Instructions (Addendum)
I dont see any abnormalities with your thigh or ankle. You can get benefit from physical therapy with leg strengthening. I can have home health services for that.  I am not sure you need extra care services at this time. Try to maintain your independence as long as possible. Please come to Geriatric clinic for a full functional assessment.  Fall Prevention, Elderly Falls are the leading cause of injuries, accidents, and accidental deaths in people over the age of 27. Falling is a real threat to your ability to live on your own. CAUSES   Poor eyesight or poor hearing can make you more likely to fall.   Illnesses and physical conditions can affect your strength and balance.   Poor lighting, throw rugs and pets in your home can make you more likely to trip or slip.   The side effects of some medicines can upset your balance and lead to falling. These include medicines for depression, sleep problems, high blood pressure, diabetes, and heart conditions.  PREVENTION  Be sure your home is as safe as possible. Here are some tips:  Wear shoes with non-skid soles (not house slippers).   Be sure your home and outside area are well lit.   Use night lights throughout your house, including hallways and stairways.   Remove clutter and clean up spills on floors and walkways.   Remove throw rugs or fasten them to the floor with carpet tape. Tack down carpet edges.   Do not place electrical cords across pathways.   Install grab bars in your bathtub, shower, and toilet area. Towel bars should not be used as a grab bar.   Install handrails on both sides of stairways.   Do not climb on stools or stepladders. Get someone else to help with jobs that require climbing.   Do not wax your floors at all, or use a non-skid wax.   Repair uneven or unsafe sidewalks, walkways or stairs.   Keep frequently used items within reach.   Be aware of pets so you do not trip.  Get regular check-ups from your  doctor, and take good care of yourself:  Have your eyes checked every year for vision changes, cataracts, glaucoma, and other eye problems. Wear eyeglasses as directed.   Have your hearing checked every 2 years, or anytime you or others think that you cannot hear well. Use hearing aids as directed.   See your caregiver if you have foot pain or corns. Sore feet can contribute to falls.   Let your caregiver know if a medicine is making you feel dizzy or making you lose your balance.   Use a cane, walker, or wheelchair as directed. Use walker or wheelchair brakes when getting in and out.   When you get up from bed, sit on the side of the bed for 1 to 2 minutes before you stand up. This will give your blood pressure time to adjust, and you will feel less dizzy.   If you need to go to the bathroom often, consider using a bedside commode.  Keep your body in good shape:  Get regular exercise, especially walking.   Do exercises to strengthen the muscles you use for walking and lifting.   Do not smoke.   Minimize use of alcohol.  SEEK IMMEDIATE MEDICAL CARE IF:   You feel dizzy, weak, or unsteady on your feet.   You feel confused.   You fall.  Document Released: 04/01/2005 Document Revised: 03/21/2011 Document Reviewed: 09/26/2006 [employer]  Patient Information 2012 [employer]. 

## 2011-12-12 DIAGNOSIS — W19XXXA Unspecified fall, initial encounter: Secondary | ICD-10-CM | POA: Insufficient documentation

## 2011-12-12 NOTE — Assessment & Plan Note (Signed)
Gait seems stable with walker currently. To return for more detailed assessment at geriatric clinic.

## 2011-12-12 NOTE — Assessment & Plan Note (Signed)
Perhaps a consequence of right ankle sprain recovery and generalized muscle disuse. Pain near patella, will check plain film though not likely to reveal cause without stated trauma. 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 Strongly encouraged daily quad strengthening and demonstrated few maneuvers. Order for [facility] PT/OT as patient is unable to drive and feels limited by ambulation with walker-must sit down to rest. Tylenol prn. Advised to f/u with geriatric clinic for more complete functional assessment and to address her need for personal care services-I do not feel this is indicated at this time.

## 2011-12-12 NOTE — Assessment & Plan Note (Addendum)
Improved pain. Had 2 plain films and 5 MD visits for this problem that now seems stabilized. Again I advised to wear a brace to prevent recurrences. She has not been compliant thus far.

## 2011-12-12 NOTE — Progress Notes (Signed)
  Subjective:    Patient ID: [name], female    DOB: [birth year], 70 y.o.   MRN: [id number]  HPI  Patient presents with home aide today, brings a list of tasks including mostly laundry-related items, ironing and request for more care hours at home. Currently gets 2 hours daily.  1. Right knee pain. Feels like her right knee is weak, doesn't extend properly compared to left. Described intermittent pain on the superior-lateral aspect, mild, worse with movements, present for few weeks. Denies any knee injuries. She had right ankle sprain 2 months ago, and prolonged pain-had 5 office/ED visits and two xrays that were negative. Now right ankle is improved.  2. Falls. Has fallen twice in past 6 months. Once with the ankle sprain. Patient ambulates with a walker and has a seat. Thinks this is due to weakness. She requests extra time with the personal care aid, seems especially worried about performing her laundry duties. Denies syncope, MSK injuries (besides knee-not associated with fall), dizziness.  Review of Systems See HPI otherwise negative.  reports that she has never smoked. She has never used smokeless tobacco.     Objective:   Physical Exam  Vitals reviewed. Constitutional: She is oriented to person, place, and time. She appears well-developed and well-nourished. No distress.  HENT:  Head: Normocephalic and atraumatic.  Mouth/Throat: Oropharynx is clear and moist.  Eyes: EOM are normal.  Neck: Neck supple.  Cardiovascular: Normal rate and regular rhythm.   Pulmonary/Chest: Effort normal and breath sounds normal. No respiratory distress. She has no wheezes.  Musculoskeletal: Normal range of motion. She exhibits tenderness. She exhibits no edema.       Has full ROM bilateral knees with extension. Mild pain with full flexion-symmetric bilaterally.   No joint effusion. Mild TTP at lateral aspect patella. Symmetric decrease in quad muscle bulk. Symmetric strength in hip flexion, knee  extension. No TTP in ankles. Not wearing a brace.   Neurological: She is alert and oriented to person, place, and time. Coordination normal.       Mildly antalgic gait. Ambulates with walker. Requires minimal assistance with climbing onto exam table.  Skin: No rash noted. She is not diaphoretic.  Psychiatric:       Flat affect. Speech normal.       Assessment & Plan:

## 2011-12-17 DIAGNOSIS — F319 Bipolar disorder, unspecified: Secondary | ICD-10-CM | POA: Diagnosis not present

## 2011-12-20 ENCOUNTER — Other Ambulatory Visit: Payer: Self-pay | Admitting: Family Medicine

## 2011-12-20 ENCOUNTER — Ambulatory Visit
Admission: RE | Admit: 2011-12-20 | Discharge: 2011-12-20 | Disposition: A | Discharge status: Home / Self Care | Payer: Medicare Other | Source: Ambulatory Visit | Attending: Family Medicine | Admitting: Family Medicine

## 2011-12-20 DIAGNOSIS — M25569 Pain in unspecified knee: Secondary | ICD-10-CM | POA: Diagnosis not present

## 2011-12-20 DIAGNOSIS — M25561 Pain in right knee: Secondary | ICD-10-CM

## 2011-12-26 ENCOUNTER — Ambulatory Visit (INDEPENDENT_AMBULATORY_CARE_PROVIDER_SITE_OTHER): Payer: Medicare Other | Admitting: Family Medicine

## 2011-12-26 ENCOUNTER — Other Ambulatory Visit: Payer: Self-pay | Admitting: Family Medicine

## 2011-12-26 ENCOUNTER — Encounter: Payer: Self-pay | Admitting: Family Medicine

## 2011-12-26 VITALS — BP 149/84 | HR 72 | Temp 98.1°F | Ht 63.0 in | Wt 150.0 lb

## 2011-12-26 DIAGNOSIS — R269 Unspecified abnormalities of gait and mobility: Secondary | ICD-10-CM

## 2011-12-26 DIAGNOSIS — I10 Essential (primary) hypertension: Secondary | ICD-10-CM | POA: Diagnosis not present

## 2011-12-26 DIAGNOSIS — F259 Schizoaffective disorder, unspecified: Secondary | ICD-10-CM | POA: Diagnosis not present

## 2011-12-26 DIAGNOSIS — N183 Chronic kidney disease, stage 3 unspecified: Secondary | ICD-10-CM | POA: Diagnosis not present

## 2011-12-26 DIAGNOSIS — N3941 Urge incontinence: Secondary | ICD-10-CM | POA: Diagnosis not present

## 2011-12-26 DIAGNOSIS — M25569 Pain in unspecified knee: Secondary | ICD-10-CM

## 2011-12-26 DIAGNOSIS — W19XXXA Unspecified fall, initial encounter: Secondary | ICD-10-CM

## 2011-12-26 DIAGNOSIS — M545 Low back pain, unspecified: Secondary | ICD-10-CM | POA: Diagnosis not present

## 2011-12-26 DIAGNOSIS — M109 Gout, unspecified: Secondary | ICD-10-CM

## 2011-12-26 DIAGNOSIS — D649 Anemia, unspecified: Secondary | ICD-10-CM

## 2011-12-26 LAB — CBC
HCT: 36.5 % (ref 36.0–46.0)
Hemoglobin: 12 g/dL (ref 12.0–15.0)
RBC: 4.32 MIL/uL (ref 3.87–5.11)
WBC: 4.3 10*3/uL (ref 4.0–10.5)

## 2011-12-26 LAB — COMPREHENSIVE METABOLIC PANEL
Albumin: 4 g/dL (ref 3.5–5.2)
BUN: 18 mg/dL (ref 6–23)
CO2: 30 mEq/L (ref 19–32)
Calcium: 11.1 mg/dL — ABNORMAL HIGH (ref 8.4–10.5)
Chloride: 104 mEq/L (ref 96–112)
Glucose, Bld: 88 mg/dL (ref 70–99)
Potassium: 4.5 mEq/L (ref 3.5–5.3)
Sodium: 141 mEq/L (ref 135–145)
Total Protein: 6.7 g/dL (ref 6.0–8.3)

## 2011-12-26 LAB — VITAMIN B12: Vitamin B-12: 1038 pg/mL — ABNORMAL HIGH (ref 211–911)

## 2011-12-26 LAB — FOLATE: Folate: >20 ng/mL

## 2011-12-26 NOTE — Assessment & Plan Note (Signed)
Stable on current medications. Noted mild tremor and flat affect possibly secondary to risperdol. Will check labs today including glucose, electrolytes. Lipid panel borderline recently.  Lab Results  Component Value Date   CHOL 194 07/24/2011   HDL 66 07/24/2011   LDLCALC 108* 07/24/2011   TRIG 99 07/24/2011   CHOLHDL 2.9 07/24/2011

## 2011-12-26 NOTE — Patient Instructions (Addendum)
Thanks for coming in today. Would like to get you some physical therapy for strengthening and walking better. Will check labs and call if abnormal. We may change your gout medicine at next visit. Please keep doing leg exercises and being active. You passed the memory and hearing tests. Take tylenol or motrin (ibuprofen) for your back strain. Make an appointment in one month for follow up.

## 2011-12-26 NOTE — Assessment & Plan Note (Signed)
Acute strain of lumbosacral area is most likely. Continue tylenol prn and f/u in one month if not improved or red flags, will consider imaging.

## 2011-12-26 NOTE — Assessment & Plan Note (Signed)
Most likely secondary to medications required for psychiatric diagnosis. Pain from OA seems to be a minor component. Will check on PT status. Encourage independence and exercise. Walker as needed for long distances.

## 2011-12-26 NOTE — Assessment & Plan Note (Signed)
Repeat uric acid. If this is >6 will start allopurinol for uric acid lowering therapy.

## 2011-12-26 NOTE — Assessment & Plan Note (Signed)
Tylenol prn. Will check on getting physical therapy.

## 2011-12-26 NOTE — Assessment & Plan Note (Signed)
Due for CBC today. Check B12 and folate also.

## 2011-12-26 NOTE — Progress Notes (Signed)
Subjective:    Patient ID: [name], female    DOB: 12-06-1940, 71 y.o.   MRN: [id number]  HPI Geriatric functional assessment.  1. Gait abnormality. Has fallen twice in past 2 years, due to losing balance while not using her walker. No syncope or passing out or vertigo. Feels generally weak with walking long distances, but uses her rolling seated walker appropriately. She is independent in all IADLS and ADLs, but requires aide to help her lift heavy objects such as groceries and laundry/heavy housework. Feels safe at home. Lives independently at [place] in [place]. Eats lunch with other seniors but prepares her other meals. Her brother lives in town and she could call him for assistance if needed. Had vitamin d level last year of 51.  2. Right knee pain. 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 No pain currently. She is using tylenol for pain. PT has not come to assess her at home yet, was ordered last month.  3. Right back pain. She bent to lift a heavy bible and jerked, felt a pull on the right back. Two days ago. Tylenol is helping. Pain radiates down right leg to foot at times. Denies numbness, tingling, falls recently.  4. Gout. Last attack was last year. Taking colchicine daily. Is willing for lab check today, denies active joint swelling or pains.   5. Bipolar/schizoaffective. Managed by Dr. Donell Beers. On depakote, risperdal and klonopin. States her depakote level was slightly high at last check last month. Denies any depression or mood symptoms currently.   Review of Systems See HPI otherwise negative.  reports that she has never smoked. She has never used smokeless tobacco. No alcohol use.      Objective:   Physical Exam  Vitals reviewed. Constitutional: She is oriented to person, place, and time. She appears well-developed and well-nourished. No distress.  HENT:  Head: Normocephalic and atraumatic.  Mouth/Throat: Oropharynx is clear and moist.  Eyes: EOM are normal.    Neck: Normal range of motion. No thyromegaly present.  Cardiovascular: Normal rate and regular rhythm.   No murmur heard. Pulmonary/Chest: Effort normal.  Abdominal: Soft.  Musculoskeletal: She exhibits no edema and no tenderness.       Right knee and ankle no TTP or effusion.  Negative straight leg testing. Mild positive FABER on right. Mild TTP in right sacral area, no bony TTP.  Gait/balance: see below.  Neurological: She is alert and oriented to person, place, and time.       MMSE 26/30 (-2 for attention, -1 for recall, -1 for drawing).  No resting tremor. Has a L>R intention tremor. Mild/trace postural tremor.  Skin: She is not diaphoretic.  Psychiatric: She has a normal mood and affect. Her behavior is normal. Judgment and thought content normal.      IADL Independent Needs Assistance Dependent  Cooking x    Housework  X-with heavy items   Manage Medications x    Manage the telephone x    Shopping for food, clothes, Meds, etc  X-with heavy items   Use transportation x    Manage Finances x        Balance Abnormal Patient value  Sitting balance    Arise    Attempts to arise    Immediate standing balance    Standing balance    Nudge    Eyes closed x Sharpened romberg very unsteady  360 degree turn x discontinuous  Sitting down     Gait Abnormal Patient value  Initiation of gait    Step length-left    Step length-right    Step height-left    Step height-right    Step symmetry    Step continuity    Path    Trunk x Lean to right  Walking stance x Slight wide base   Assessment & Plan:

## 2011-12-26 NOTE — Assessment & Plan Note (Addendum)
Likely from deconditioning or iatrogenic medication effect. Klonopin managed by outpatient psychiatry and is chronic, perhaps a change to shorter acting medication would be beneficial in the future. Would like PT to assess home for safety. Instructed to use walker with seat for longer distances. Maintain independent activity and exercise as much as possible. Vit D 51 last check.

## 2011-12-26 NOTE — Assessment & Plan Note (Signed)
Symptoms stable using diapers for now.

## 2011-12-26 NOTE — Assessment & Plan Note (Signed)
BP Readings from Last 3 Encounters:  12/26/11 149/84  12/11/11 148/65  11/04/11 155/79   Borderline goal for age on diltiazem. Check labs and f/u in one month.

## 2011-12-26 NOTE — Assessment & Plan Note (Signed)
Due for labs today as she has refused in the past. Repeat calcium-was noted as high last check.

## 2011-12-30 ENCOUNTER — Other Ambulatory Visit: Payer: Self-pay | Admitting: Family Medicine

## 2011-12-30 ENCOUNTER — Telehealth: Payer: Self-pay | Admitting: *Deleted

## 2011-12-30 DIAGNOSIS — M25569 Pain in unspecified knee: Secondary | ICD-10-CM | POA: Diagnosis not present

## 2011-12-30 DIAGNOSIS — M109 Gout, unspecified: Secondary | ICD-10-CM

## 2011-12-30 DIAGNOSIS — R269 Unspecified abnormalities of gait and mobility: Secondary | ICD-10-CM | POA: Diagnosis not present

## 2011-12-30 DIAGNOSIS — D649 Anemia, unspecified: Secondary | ICD-10-CM | POA: Diagnosis not present

## 2011-12-30 DIAGNOSIS — F319 Bipolar disorder, unspecified: Secondary | ICD-10-CM | POA: Diagnosis not present

## 2011-12-30 DIAGNOSIS — F259 Schizoaffective disorder, unspecified: Secondary | ICD-10-CM | POA: Diagnosis not present

## 2011-12-30 MED ORDER — ALLOPURINOL 100 MG PO TABS: 100.0000 mg | ORAL_TABLET | Freq: Every day | ORAL | Status: DC

## 2011-12-30 NOTE — Telephone Encounter (Signed)
Message copied by Farrell Ours on Mon Dec 30, 2011  8:40 AM ------      Message from: Durwin Reges      Created: Mon Dec 30, 2011  8:26 AM       Please call patient with results: Elevated uric acid (gout level). Will start allopurinol at low dose 100mg / day. Please schedule follow up visit in 2-4 weeks.

## 2011-12-30 NOTE — Telephone Encounter (Signed)
Spoke with patient and gave below message. Per patient's request I also called [facility] and medication will be delivered to home.

## 2011-12-30 NOTE — Telephone Encounter (Signed)
Message copied by Farrell Ours on Mon Dec 30, 2011  8:45 AM ------      Message from: Durwin Reges      Created: Mon Dec 30, 2011  8:26 AM       Please call patient with results: Elevated uric acid (gout level). Will start allopurinol at low dose 100mg / day. Please schedule follow up visit in 2-4 weeks.

## 2012-01-01 ENCOUNTER — Telehealth: Payer: Self-pay | Admitting: Family Medicine

## 2012-01-01 DIAGNOSIS — D649 Anemia, unspecified: Secondary | ICD-10-CM | POA: Diagnosis not present

## 2012-01-01 DIAGNOSIS — M109 Gout, unspecified: Secondary | ICD-10-CM | POA: Diagnosis not present

## 2012-01-01 DIAGNOSIS — M25569 Pain in unspecified knee: Secondary | ICD-10-CM | POA: Diagnosis not present

## 2012-01-01 DIAGNOSIS — F319 Bipolar disorder, unspecified: Secondary | ICD-10-CM | POA: Diagnosis not present

## 2012-01-01 DIAGNOSIS — F259 Schizoaffective disorder, unspecified: Secondary | ICD-10-CM | POA: Diagnosis not present

## 2012-01-01 DIAGNOSIS — R269 Unspecified abnormalities of gait and mobility: Secondary | ICD-10-CM | POA: Diagnosis not present

## 2012-01-01 NOTE — Telephone Encounter (Signed)
Ok to discontinue the medication. Would like to try alternative perhaps febuxostat but will wait for patient to f/u to discuss this medication, risks, side effects. She has appointment in 3 weeks.

## 2012-01-01 NOTE — Telephone Encounter (Signed)
Thinks she is having a reaction to the allopurinol - dizzy and almost fell out of chair

## 2012-01-01 NOTE — Telephone Encounter (Signed)
Advise patient to f/u as scheduled. Notify us if symptoms do not resolve.

## 2012-01-01 NOTE — Telephone Encounter (Signed)
Patient states she started allopurinol 2 days ago. She is experiencing, blurry vision, head itching , upset stomach, diarrhea.   Consulted with Dr. Leveda Anna and he advises for patient to stop the medication now  And then restart in one week to give another try. Patient is not willing to do this. States she has read the side effects and  the med has too many  , jaundice, liver damage ,and she does not want to take it.  Advised will notify her doctor for further instructions.

## 2012-01-01 NOTE — Telephone Encounter (Signed)
Called pt and LMIOVM advised her to follow up concerning meds on 10.11.13 however should the sxs that she is having persist she can call back and schedule an appt sooner.Loralee Pacas [place]

## 2012-01-02 DIAGNOSIS — R269 Unspecified abnormalities of gait and mobility: Secondary | ICD-10-CM | POA: Diagnosis not present

## 2012-01-02 DIAGNOSIS — M25569 Pain in unspecified knee: Secondary | ICD-10-CM | POA: Diagnosis not present

## 2012-01-02 DIAGNOSIS — F259 Schizoaffective disorder, unspecified: Secondary | ICD-10-CM | POA: Diagnosis not present

## 2012-01-02 DIAGNOSIS — F319 Bipolar disorder, unspecified: Secondary | ICD-10-CM | POA: Diagnosis not present

## 2012-01-02 DIAGNOSIS — D649 Anemia, unspecified: Secondary | ICD-10-CM | POA: Diagnosis not present

## 2012-01-02 DIAGNOSIS — M109 Gout, unspecified: Secondary | ICD-10-CM | POA: Diagnosis not present

## 2012-01-06 DIAGNOSIS — R269 Unspecified abnormalities of gait and mobility: Secondary | ICD-10-CM | POA: Diagnosis not present

## 2012-01-06 DIAGNOSIS — M109 Gout, unspecified: Secondary | ICD-10-CM | POA: Diagnosis not present

## 2012-01-06 DIAGNOSIS — F259 Schizoaffective disorder, unspecified: Secondary | ICD-10-CM | POA: Diagnosis not present

## 2012-01-06 DIAGNOSIS — D649 Anemia, unspecified: Secondary | ICD-10-CM | POA: Diagnosis not present

## 2012-01-06 DIAGNOSIS — F319 Bipolar disorder, unspecified: Secondary | ICD-10-CM | POA: Diagnosis not present

## 2012-01-06 DIAGNOSIS — M25569 Pain in unspecified knee: Secondary | ICD-10-CM | POA: Diagnosis not present

## 2012-01-07 DIAGNOSIS — D649 Anemia, unspecified: Secondary | ICD-10-CM | POA: Diagnosis not present

## 2012-01-07 DIAGNOSIS — F259 Schizoaffective disorder, unspecified: Secondary | ICD-10-CM | POA: Diagnosis not present

## 2012-01-07 DIAGNOSIS — R269 Unspecified abnormalities of gait and mobility: Secondary | ICD-10-CM | POA: Diagnosis not present

## 2012-01-07 DIAGNOSIS — M109 Gout, unspecified: Secondary | ICD-10-CM | POA: Diagnosis not present

## 2012-01-07 DIAGNOSIS — M25569 Pain in unspecified knee: Secondary | ICD-10-CM | POA: Diagnosis not present

## 2012-01-07 DIAGNOSIS — F319 Bipolar disorder, unspecified: Secondary | ICD-10-CM | POA: Diagnosis not present

## 2012-01-08 DIAGNOSIS — M25569 Pain in unspecified knee: Secondary | ICD-10-CM | POA: Diagnosis not present

## 2012-01-08 DIAGNOSIS — R269 Unspecified abnormalities of gait and mobility: Secondary | ICD-10-CM | POA: Diagnosis not present

## 2012-01-08 DIAGNOSIS — D649 Anemia, unspecified: Secondary | ICD-10-CM | POA: Diagnosis not present

## 2012-01-08 DIAGNOSIS — F259 Schizoaffective disorder, unspecified: Secondary | ICD-10-CM | POA: Diagnosis not present

## 2012-01-08 DIAGNOSIS — M109 Gout, unspecified: Secondary | ICD-10-CM | POA: Diagnosis not present

## 2012-01-08 DIAGNOSIS — F319 Bipolar disorder, unspecified: Secondary | ICD-10-CM | POA: Diagnosis not present

## 2012-01-09 ENCOUNTER — Encounter (HOSPITAL_COMMUNITY): Payer: Self-pay

## 2012-01-09 ENCOUNTER — Emergency Department (HOSPITAL_COMMUNITY): Payer: Medicare Other

## 2012-01-09 ENCOUNTER — Emergency Department (HOSPITAL_COMMUNITY)
Admission: EM | Admit: 2012-01-09 | Discharge: 2012-01-10 | Disposition: A | Discharge status: Home / Self Care | Payer: Medicare Other | Attending: Emergency Medicine | Admitting: Emergency Medicine

## 2012-01-09 DIAGNOSIS — N189 Chronic kidney disease, unspecified: Secondary | ICD-10-CM | POA: Diagnosis not present

## 2012-01-09 DIAGNOSIS — Z9089 Acquired absence of other organs: Secondary | ICD-10-CM | POA: Insufficient documentation

## 2012-01-09 DIAGNOSIS — Z79899 Other long term (current) drug therapy: Secondary | ICD-10-CM | POA: Diagnosis not present

## 2012-01-09 DIAGNOSIS — IMO0001 Reserved for inherently not codable concepts without codable children: Secondary | ICD-10-CM | POA: Diagnosis not present

## 2012-01-09 DIAGNOSIS — M791 Myalgia, unspecified site: Secondary | ICD-10-CM

## 2012-01-09 DIAGNOSIS — F319 Bipolar disorder, unspecified: Secondary | ICD-10-CM | POA: Diagnosis not present

## 2012-01-09 DIAGNOSIS — I129 Hypertensive chronic kidney disease with stage 1 through stage 4 chronic kidney disease, or unspecified chronic kidney disease: Secondary | ICD-10-CM | POA: Insufficient documentation

## 2012-01-09 DIAGNOSIS — M109 Gout, unspecified: Secondary | ICD-10-CM | POA: Diagnosis not present

## 2012-01-09 DIAGNOSIS — R5381 Other malaise: Secondary | ICD-10-CM | POA: Diagnosis not present

## 2012-01-09 DIAGNOSIS — R0602 Shortness of breath: Secondary | ICD-10-CM | POA: Diagnosis not present

## 2012-01-09 DIAGNOSIS — R079 Chest pain, unspecified: Secondary | ICD-10-CM | POA: Insufficient documentation

## 2012-01-09 DIAGNOSIS — I498 Other specified cardiac arrhythmias: Secondary | ICD-10-CM | POA: Diagnosis not present

## 2012-01-09 DIAGNOSIS — Z7982 Long term (current) use of aspirin: Secondary | ICD-10-CM | POA: Diagnosis not present

## 2012-01-09 DIAGNOSIS — M25569 Pain in unspecified knee: Secondary | ICD-10-CM | POA: Diagnosis not present

## 2012-01-09 DIAGNOSIS — D649 Anemia, unspecified: Secondary | ICD-10-CM | POA: Diagnosis not present

## 2012-01-09 DIAGNOSIS — F259 Schizoaffective disorder, unspecified: Secondary | ICD-10-CM | POA: Diagnosis not present

## 2012-01-09 DIAGNOSIS — R269 Unspecified abnormalities of gait and mobility: Secondary | ICD-10-CM | POA: Diagnosis not present

## 2012-01-09 LAB — COMPREHENSIVE METABOLIC PANEL
ALT: 14 U/L (ref 0–35)
AST: 21 U/L (ref 0–37)
Albumin: 3.4 g/dL — ABNORMAL LOW (ref 3.5–5.2)
CO2: 28 mEq/L (ref 19–32)
Chloride: 102 mEq/L (ref 96–112)
GFR calc non Af Amer: 52 mL/min — ABNORMAL LOW (ref >90)
Potassium: 4 mEq/L (ref 3.5–5.1)
Sodium: 138 mEq/L (ref 135–145)
Total Bilirubin: 0.2 mg/dL — ABNORMAL LOW (ref 0.3–1.2)

## 2012-01-09 LAB — CBC
Platelets: 143 10*3/uL — ABNORMAL LOW (ref 150–400)
RBC: 4.04 MIL/uL (ref 3.87–5.11)
RDW: 15.6 % — ABNORMAL HIGH (ref 11.5–15.5)
WBC: 5.5 10*3/uL (ref 4.0–10.5)

## 2012-01-09 LAB — GRAM STAIN

## 2012-01-09 LAB — URINALYSIS, ROUTINE W REFLEX MICROSCOPIC
Bilirubin Urine: NEGATIVE
Glucose, UA: NEGATIVE mg/dL
Hgb urine dipstick: NEGATIVE
Protein, ur: NEGATIVE mg/dL
Specific Gravity, Urine: 1.003 — ABNORMAL LOW (ref 1.005–1.030)
Urobilinogen, UA: 0.2 mg/dL (ref 0.0–1.0)

## 2012-01-09 LAB — TROPONIN I: Troponin I: <0.3 ng/mL (ref <0.30)

## 2012-01-09 MED ORDER — KETOROLAC TROMETHAMINE 30 MG/ML IJ SOLN
30.0000 mg | Freq: Once | INTRAMUSCULAR | Status: DC
  Filled 2012-01-09: qty 1

## 2012-01-09 NOTE — ED Notes (Addendum)
Pt reports feeling "weird" earlier today, hot and cold flashes, nausea, weakness, SOB starting earlier today, pt reports she went to sit down on a couch was slumped over and sts "felt like everything was cut off from the waist down," weakness her arms, and a (L) side dull chest pain.

## 2012-01-09 NOTE — ED Provider Notes (Addendum)
71 year old female has had a vague anterior chest pain since this afternoon. Pain is constant, but worse with deep breathing and worse with twisting and turning. There is mild associated dyspnea nausea. She states she has had similar pains before but has never been evaluated for them and this one is more severe than prior. She took aspirin at home but is tried no other treatments. On exam, lungs are clear and heart has a regular rate rhythm. There is mild to moderate chest wall tenderness which does reproduce her pain. Extremities have no cyanosis or edema. She does have a flat affect which does make evaluation somewhat difficult. She was given a therapeutic trial of ketorolac and reevaluated. Review of prior records shows no cardiology evaluation, and no stress test that I can find.   Date: 01/09/2012  Rate: 81  Rhythm: normal sinus rhythm  QRS Axis: normal  Intervals: normal  ST/T Wave abnormalities: nonspecific ST/T changes  Conduction Disutrbances:none  Narrative Interpretation: Left ventricular hypertrophy with secondary repolarization abnormality. When compared with ECG of 06/10/2010, no significant changes are seen.  Old EKG Reviewed: unchanged   Dione Booze, MD 01/09/12 2320  I saw and evaluated the patient, reviewed the resident's note and I agree with the findings and plan.   Dione Booze, MD 01/13/12 [phone]

## 2012-01-09 NOTE — ED Notes (Signed)
Per EMS pt from home, pt reports (L) side chest pain starting 45 mins ago, hx of angina, pt reports symptoms feel the same. Pt tearful, lives alone, pt was at neighbors house at onset. Pt took ASA 324 mg prior to ems arrival, pt given nitro x1 en route, 24 g RH. VSS  BP 188/92, HR 86, rr 18, pain 6/10

## 2012-01-09 NOTE — ED Provider Notes (Signed)
History     CSN: [id number]  Arrival date & time 01/09/12  2046   First MD Initiated Contact with Patient 01/09/12 2131      Chief Complaint  Patient presents with  . Chest Pain    (Consider location/radiation/quality/duration/timing/severity/associated sxs/prior treatment) Patient is a 71 y.o. female presenting with abdominal pain. The history is provided by the patient.  Abdominal Pain The primary symptoms of the illness include abdominal pain, fatigue and nausea. The primary symptoms of the illness do not include fever, shortness of breath, vomiting, diarrhea or dysuria. The current episode started 6 to 12 hours ago. The onset of the illness was gradual. The problem has not changed since onset. The abdominal pain began 6 to 12 hours ago. The pain came on gradually. The abdominal pain has been unchanged since its onset. The abdominal pain is located in the epigastric region. The abdominal pain does not radiate. The abdominal pain is relieved by nothing. The abdominal pain is exacerbated by vomiting.  The illness is associated with awakening from sleep. The patient has not had a change in bowel habit. Risk factors for an acute abdominal problem include being elderly. Additional symptoms associated with the illness include constipation (intermittent, had BM yesterday). Symptoms associated with the illness do not include chills, anorexia, diaphoresis or heartburn.    Past Medical History  Diagnosis Date  . Depression   . Gout   . Bipolar 1 disorder   . Schizoaffective disorder   . Chronic kidney disease   . Hypertension   . Seizure disorder   . Thrombocytopenia   . Anemia     Past Surgical History  Procedure Date  . Tubal ligation   . Appendectomy     History reviewed. No pertinent family history.  History  Substance Use Topics  . Smoking status: Never Smoker   . Smokeless tobacco: Never Used  . Alcohol Use: No    OB History    Grav Para Term Preterm Abortions TAB  SAB Ect Mult Living                  Review of Systems  Constitutional: Positive for fatigue. Negative for fever, chills and diaphoresis.  Respiratory: Negative for cough and shortness of breath.   Cardiovascular: Negative for chest pain.  Gastrointestinal: Positive for nausea, abdominal pain and constipation (intermittent, had BM yesterday). Negative for heartburn, vomiting, diarrhea and anorexia.  Genitourinary: Negative for dysuria.  Neurological: Negative for headaches.  All other systems reviewed and are negative.    Allergies  Benztropine mesylate; Lisinopril; and Sulfonamide derivatives  Home Medications   Current Outpatient Rx  Name Route Sig Dispense Refill  . ACETAMINOPHEN 500 MG PO TABS Oral Take 500 mg by mouth every 6 (six) hours as needed. For pain    . ASPIRIN 81 MG PO CHEW Oral Chew 81 mg by mouth daily.     . B COMPLEX-C PO TABS Oral Take 1 tablet by mouth daily.    [name] CALCIUM CARBONATE-VITAMIN D 600-400 MG-UNIT PO TABS Oral Take 1 tablet by mouth 2 (two) times daily.     [name] CLONAZEPAM 0.5 MG PO TABS Oral Take 0.5 mg by mouth at bedtime as needed. For Anxiety    . DILTIAZEM HCL ER COATED BEADS 120 MG PO CP24 Oral Take 120 mg by mouth daily.     [name] DIVALPROEX SODIUM 500 MG PO TBEC Oral Take 500 mg by mouth 2 (two) times daily.     [name] FLAXSEED OIL  PO Oral Take 1 capsule by mouth daily.    . ADULT MULTIVITAMIN W/MINERALS CH Oral Take 1 tablet by mouth daily. Centrum for women    . FISH OIL 1000 MG PO CAPS Oral Take 1 capsule by mouth at bedtime.     [name] POLYVINYL ALCOHOL 1.4 % OP SOLN Both Eyes Place 1 drop into both eyes as needed. For dry eyes    . RISPERIDONE 1 MG PO TABS Oral Take 0.5-1 mg by mouth 2 (two) times daily. 0.5mg  in the morning 1mg  at evening.    [name] VITAMIN E 400 UNITS PO CAPS Oral Take 400 Units by mouth every morning.       BP 156/121  Pulse 84  Temp 98.5 F (36.9 C) (Oral)  Resp 18  SpO2 98%  Physical Exam  Nursing note and vitals  reviewed. Constitutional: She is oriented to person, place, and time. She appears well-developed and well-nourished. No distress.  HENT:  Head: Normocephalic and atraumatic.  Eyes: EOM are normal. Pupils are equal, round, and reactive to light.  Neck: Normal range of motion.  Cardiovascular: Normal rate and normal heart sounds.   Pulmonary/Chest: Effort normal and breath sounds normal. No respiratory distress.  Abdominal: Soft. She exhibits no distension. There is no tenderness. There is no rebound and no guarding.  Musculoskeletal: Normal range of motion.  Neurological: She is alert and oriented to person, place, and time.  Skin: Skin is warm and dry.    ED Course  Procedures (including critical care time)  Labs Reviewed  CBC - Abnormal; Notable for the following:    Hemoglobin 11.4 (*)     HCT 34.0 (*)     RDW 15.6 (*)     Platelets 143 (*)     All other components within normal limits  COMPREHENSIVE METABOLIC PANEL - Abnormal; Notable for the following:    Albumin 3.4 (*)     Total Bilirubin 0.2 (*)     GFR calc non Af Amer 52 (*)     GFR calc Af Amer 60 (*)     All other components within normal limits  URINALYSIS, ROUTINE W REFLEX MICROSCOPIC - Abnormal; Notable for the following:    Color, Urine STRAW (*)     Specific Gravity, Urine 1.003 (*)     All other components within normal limits  GRAM STAIN  LIPASE, BLOOD  TROPONIN I  URINE CULTURE   Dg Chest 2 View  01/09/2012  *RADIOLOGY REPORT*  Clinical Data: Chest pain.  Short of breath.  Weakness.  CHEST - 2 VIEW  Comparison: 06/21/2010.  Findings:  Cardiopericardial silhouette within normal limits. Mediastinal contours normal. Trachea midline.  No airspace disease or effusion.  Right AC joint osteoarthritis.  Monitoring leads volumes are projected over the chest.  IMPRESSION: No active cardiopulmonary disease.  No interval change.   Original Report Authenticated By: Andreas Newport, M.D.      1. Myalgia       MDM   9:40 PM Pt seen and examined. Pt with chest tightness and pressure in stomach as well as body aches today. Tonight the pain got worse, so her friend told her to call 911. Pt with poor affect. Will do screening labs including troponin and urine. Will also get EKG and CXR.  11:46 PM Work up unremarkable. EKG largely unchanged from prior (bad tracing this time). Will give dose of toradol to see if there is improvement in symptoms. Doubt acute process as patient is appearing lonely,  wanting to talk at length with staff and requested a chaplin visit as well.   12:51 AM Pt has refused toradol stating that she feels fine and is ready to go home. Will discharge.       Daleen Bo, MD 01/10/12 [phone]

## 2012-01-10 DIAGNOSIS — D649 Anemia, unspecified: Secondary | ICD-10-CM | POA: Diagnosis not present

## 2012-01-10 DIAGNOSIS — M109 Gout, unspecified: Secondary | ICD-10-CM | POA: Diagnosis not present

## 2012-01-10 DIAGNOSIS — F259 Schizoaffective disorder, unspecified: Secondary | ICD-10-CM | POA: Diagnosis not present

## 2012-01-10 DIAGNOSIS — M25569 Pain in unspecified knee: Secondary | ICD-10-CM | POA: Diagnosis not present

## 2012-01-10 DIAGNOSIS — R269 Unspecified abnormalities of gait and mobility: Secondary | ICD-10-CM | POA: Diagnosis not present

## 2012-01-10 DIAGNOSIS — F319 Bipolar disorder, unspecified: Secondary | ICD-10-CM | POA: Diagnosis not present

## 2012-01-10 LAB — URINE CULTURE: Culture: NO GROWTH

## 2012-01-10 NOTE — ED Notes (Signed)
The patient is AOx4 and comfortable with her discharge instructions. 

## 2012-01-13 DIAGNOSIS — F319 Bipolar disorder, unspecified: Secondary | ICD-10-CM | POA: Diagnosis not present

## 2012-01-13 DIAGNOSIS — M109 Gout, unspecified: Secondary | ICD-10-CM | POA: Diagnosis not present

## 2012-01-13 DIAGNOSIS — R269 Unspecified abnormalities of gait and mobility: Secondary | ICD-10-CM | POA: Diagnosis not present

## 2012-01-13 DIAGNOSIS — F259 Schizoaffective disorder, unspecified: Secondary | ICD-10-CM | POA: Diagnosis not present

## 2012-01-13 DIAGNOSIS — M25569 Pain in unspecified knee: Secondary | ICD-10-CM | POA: Diagnosis not present

## 2012-01-13 DIAGNOSIS — D649 Anemia, unspecified: Secondary | ICD-10-CM | POA: Diagnosis not present

## 2012-01-14 DIAGNOSIS — M109 Gout, unspecified: Secondary | ICD-10-CM | POA: Diagnosis not present

## 2012-01-14 DIAGNOSIS — D649 Anemia, unspecified: Secondary | ICD-10-CM | POA: Diagnosis not present

## 2012-01-14 DIAGNOSIS — R269 Unspecified abnormalities of gait and mobility: Secondary | ICD-10-CM | POA: Diagnosis not present

## 2012-01-14 DIAGNOSIS — M25569 Pain in unspecified knee: Secondary | ICD-10-CM | POA: Diagnosis not present

## 2012-01-14 DIAGNOSIS — F319 Bipolar disorder, unspecified: Secondary | ICD-10-CM | POA: Diagnosis not present

## 2012-01-14 DIAGNOSIS — F259 Schizoaffective disorder, unspecified: Secondary | ICD-10-CM | POA: Diagnosis not present

## 2012-01-15 DIAGNOSIS — M109 Gout, unspecified: Secondary | ICD-10-CM | POA: Diagnosis not present

## 2012-01-15 DIAGNOSIS — M25569 Pain in unspecified knee: Secondary | ICD-10-CM | POA: Diagnosis not present

## 2012-01-15 DIAGNOSIS — F319 Bipolar disorder, unspecified: Secondary | ICD-10-CM | POA: Diagnosis not present

## 2012-01-15 DIAGNOSIS — R269 Unspecified abnormalities of gait and mobility: Secondary | ICD-10-CM | POA: Diagnosis not present

## 2012-01-15 DIAGNOSIS — F259 Schizoaffective disorder, unspecified: Secondary | ICD-10-CM | POA: Diagnosis not present

## 2012-01-15 DIAGNOSIS — D649 Anemia, unspecified: Secondary | ICD-10-CM | POA: Diagnosis not present

## 2012-01-17 DIAGNOSIS — R269 Unspecified abnormalities of gait and mobility: Secondary | ICD-10-CM | POA: Diagnosis not present

## 2012-01-17 DIAGNOSIS — F319 Bipolar disorder, unspecified: Secondary | ICD-10-CM | POA: Diagnosis not present

## 2012-01-17 DIAGNOSIS — M25569 Pain in unspecified knee: Secondary | ICD-10-CM | POA: Diagnosis not present

## 2012-01-17 DIAGNOSIS — M109 Gout, unspecified: Secondary | ICD-10-CM | POA: Diagnosis not present

## 2012-01-17 DIAGNOSIS — F259 Schizoaffective disorder, unspecified: Secondary | ICD-10-CM | POA: Diagnosis not present

## 2012-01-17 DIAGNOSIS — D649 Anemia, unspecified: Secondary | ICD-10-CM | POA: Diagnosis not present

## 2012-01-20 DIAGNOSIS — R269 Unspecified abnormalities of gait and mobility: Secondary | ICD-10-CM | POA: Diagnosis not present

## 2012-01-20 DIAGNOSIS — M109 Gout, unspecified: Secondary | ICD-10-CM | POA: Diagnosis not present

## 2012-01-20 DIAGNOSIS — F259 Schizoaffective disorder, unspecified: Secondary | ICD-10-CM | POA: Diagnosis not present

## 2012-01-20 DIAGNOSIS — F319 Bipolar disorder, unspecified: Secondary | ICD-10-CM | POA: Diagnosis not present

## 2012-01-20 DIAGNOSIS — D649 Anemia, unspecified: Secondary | ICD-10-CM | POA: Diagnosis not present

## 2012-01-20 DIAGNOSIS — M25569 Pain in unspecified knee: Secondary | ICD-10-CM | POA: Diagnosis not present

## 2012-01-21 ENCOUNTER — Telehealth: Payer: Self-pay | Admitting: Family Medicine

## 2012-01-21 NOTE — Telephone Encounter (Signed)
LVM to ok additional visits

## 2012-01-21 NOTE — Telephone Encounter (Signed)
Needs another 2-3 visits for her in home care - she is close to discharge, but needs a few more visits - needs verbal

## 2012-01-21 NOTE — Telephone Encounter (Signed)
Yes this is ok 

## 2012-01-21 NOTE — Telephone Encounter (Signed)
Can I go and give these verbal orders?

## 2012-01-22 DIAGNOSIS — D649 Anemia, unspecified: Secondary | ICD-10-CM | POA: Diagnosis not present

## 2012-01-22 DIAGNOSIS — F319 Bipolar disorder, unspecified: Secondary | ICD-10-CM | POA: Diagnosis not present

## 2012-01-22 DIAGNOSIS — R269 Unspecified abnormalities of gait and mobility: Secondary | ICD-10-CM | POA: Diagnosis not present

## 2012-01-22 DIAGNOSIS — M25569 Pain in unspecified knee: Secondary | ICD-10-CM | POA: Diagnosis not present

## 2012-01-22 DIAGNOSIS — F259 Schizoaffective disorder, unspecified: Secondary | ICD-10-CM | POA: Diagnosis not present

## 2012-01-22 DIAGNOSIS — M109 Gout, unspecified: Secondary | ICD-10-CM | POA: Diagnosis not present

## 2012-01-23 DIAGNOSIS — M79609 Pain in unspecified limb: Secondary | ICD-10-CM | POA: Diagnosis not present

## 2012-01-23 DIAGNOSIS — B351 Tinea unguium: Secondary | ICD-10-CM | POA: Diagnosis not present

## 2012-01-24 ENCOUNTER — Encounter: Payer: Self-pay | Admitting: Family Medicine

## 2012-01-24 ENCOUNTER — Ambulatory Visit (INDEPENDENT_AMBULATORY_CARE_PROVIDER_SITE_OTHER): Payer: Medicare Other | Admitting: Family Medicine

## 2012-01-24 VITALS — BP 148/71 | HR 81 | Temp 97.4°F | Wt 150.0 lb

## 2012-01-24 DIAGNOSIS — I10 Essential (primary) hypertension: Secondary | ICD-10-CM

## 2012-01-24 DIAGNOSIS — M109 Gout, unspecified: Secondary | ICD-10-CM

## 2012-01-24 DIAGNOSIS — R1013 Epigastric pain: Secondary | ICD-10-CM

## 2012-01-24 DIAGNOSIS — K3189 Other diseases of stomach and duodenum: Secondary | ICD-10-CM | POA: Diagnosis not present

## 2012-01-24 DIAGNOSIS — K30 Functional dyspepsia: Secondary | ICD-10-CM | POA: Insufficient documentation

## 2012-01-24 DIAGNOSIS — W19XXXA Unspecified fall, initial encounter: Secondary | ICD-10-CM

## 2012-01-24 MED ORDER — OMEPRAZOLE 20 MG PO CPDR: 20.0000 mg | DELAYED_RELEASE_CAPSULE | Freq: Every day | ORAL | Status: DC

## 2012-01-24 NOTE — Patient Instructions (Addendum)
I am glad your pain resolved. You can start a medicine called omeprazole for indigestion or take tums as needed. I think you still are basically independent in functioning. I do not see a change in status that requires additional home aide hours. If you would like to pay for additional assistance, this is up to you. Goal is to maintain independence as much as necessary. I will discuss with our social worker any additional programs you may qualify for.

## 2012-01-27 ENCOUNTER — Telehealth: Payer: Self-pay | Admitting: Clinical

## 2012-01-27 NOTE — Assessment & Plan Note (Signed)
Patient has fallen recently. She has had full physical therapy assessment and recommendation for outpatient therapy. Her vitamin D level is 51. I discussed the patient that I do not feel comfortable increasing her home aide hours greater than 10 currently. My goal is for her to maintain her independence and functionality as long as possible. Full functional assessment and IADLs are documented in previous office visit. I feel her main desire is for more socialization as evidenced at the recent emergency department visit. I encouraged her to pursue social activities. Followup in 2-3 months.

## 2012-01-27 NOTE — Assessment & Plan Note (Signed)
At goal for age. We'll not make changes today. Followup 3-4 months.

## 2012-01-27 NOTE — Assessment & Plan Note (Signed)
Possibly related to her noncardiac chest pain recently. The patient refuses trial of PPI. Advised her she can use OTC medications as needed.

## 2012-01-27 NOTE — Progress Notes (Signed)
  Subjective:    Patient ID: [name], female    DOB: Feb 20, 1941, 71 y.o.   MRN: [id number]  HPI ED f/u chest pain.  1. noncardiac chest pain. Patient evaluated in the emergency department and symptoms were deemed either her anxiety related, notes indicate patient seemed to mainly desire interaction with nurses. Patient describes the pain as being onset from sitting in a car room and relieved spontaneously. States that it was attributed to severe heartburn. She refuses any further evaluation. States the staff tried to give her an injection of Toradol "knowing nothing was wrong with me" which she refused. Denies any recurrent chest pain, exertional dyspnea, leg edema, palpitations, blood in stool, emesis.  2. falls. The patient states she fell again last week after she is and states that her right leg felt weaker than the left leg. Home health physical therapy has assessed her home and recommended an outpatient therapy management. Patient continues to request additional home aide hours during the week. She already gets 10 hours per week. Desires more, mainly for help with her laundry and ironing. She also states that she doesn't have a friend to look out for or anyone to call when she is sick. She is ambulatory with her rolling walker. Denies syncope, dizziness, vertigo, numbness in feet, tingling.  3. HTN. Brings log today. Range 130-140s/66-76. Denies visual changes, edema, med side effects.  Review of Systems See HPI otherwise negative.  reports that she has never smoked. She has never used smokeless tobacco.     Objective:   Physical Exam  Vitals reviewed. Constitutional: She is oriented to person, place, and time. She appears well-developed and well-nourished. No distress.  HENT:  Head: Normocephalic and atraumatic.  Mouth/Throat: Oropharynx is clear and moist.  Eyes: EOM are normal. Pupils are equal, round, and reactive to light.  Neck: Neck supple.  Cardiovascular: Normal rate,  regular rhythm and normal heart sounds.   No murmur heard. Pulmonary/Chest: Effort normal and breath sounds normal. No respiratory distress. She has no wheezes. She has no rales.  Abdominal: Soft. There is no tenderness.  Musculoskeletal: She exhibits no edema and no tenderness.  Neurological: She is alert and oriented to person, place, and time.  Skin: No rash noted. She is not diaphoretic.  Psychiatric:       Flat affect       Assessment & Plan:

## 2012-01-27 NOTE — Telephone Encounter (Signed)
Clinical Child psychotherapist (CSW) received referral to provide social support to pt. CSW contacted numbers listed on face sheet and left messages.   Theresia Bough, MSW, Theresia Majors [phone]

## 2012-01-27 NOTE — Assessment & Plan Note (Signed)
Patient did not tolerate allopurinol. She refuses other medications currently. May give trial off colchicine.

## 2012-01-28 ENCOUNTER — Telehealth: Payer: Self-pay | Admitting: Family Medicine

## 2012-01-28 ENCOUNTER — Telehealth: Payer: Self-pay | Admitting: Clinical

## 2012-01-28 DIAGNOSIS — M109 Gout, unspecified: Secondary | ICD-10-CM | POA: Diagnosis not present

## 2012-01-28 DIAGNOSIS — F319 Bipolar disorder, unspecified: Secondary | ICD-10-CM | POA: Diagnosis not present

## 2012-01-28 DIAGNOSIS — M25569 Pain in unspecified knee: Secondary | ICD-10-CM | POA: Diagnosis not present

## 2012-01-28 DIAGNOSIS — R269 Unspecified abnormalities of gait and mobility: Secondary | ICD-10-CM | POA: Diagnosis not present

## 2012-01-28 DIAGNOSIS — F259 Schizoaffective disorder, unspecified: Secondary | ICD-10-CM | POA: Diagnosis not present

## 2012-01-28 DIAGNOSIS — D649 Anemia, unspecified: Secondary | ICD-10-CM | POA: Diagnosis not present

## 2012-01-28 NOTE — Telephone Encounter (Signed)
Clinical Child psychotherapist (CSW) spoke with pt regarding possible assistance CSW could provide as CSW was informed that pt may need some support at home.  Pt informed CSW that she is in need of an additional day from her caregiver however in order to get an additional day her PCP must complete a certain form. Pt stated the forms were given to her PCP at her last visit. CSW informed pt that CSW would relate message to her PCP and provide her with pt caregiver supervisor contact number [name] [phone]) for questions. At this moment pt does not having CSW needs as necessary forms need to be signed by PCP.  [name] Bough, MSW, [name] [phone]

## 2012-01-28 NOTE — Telephone Encounter (Signed)
Patient is calling requesting a Rx for pull ups to be sent to Medicare.

## 2012-01-29 NOTE — Telephone Encounter (Signed)
Please see my recent office visit note. I do not feel she has actual additional caregiving needs. She is rather independent at this time. Her needs seem mainly social and perhaps a Engineer, maintenance (IT).

## 2012-01-29 NOTE — Telephone Encounter (Signed)
Patient states that she has not gotten these as a prescription before and that she has been paying out of pocket.

## 2012-01-29 NOTE — Telephone Encounter (Signed)
Spoke with patient and informed her that rx for pull ups is up front for pick up.

## 2012-01-29 NOTE — Telephone Encounter (Signed)
Normally there is a form that the company sends that I complete. I can't just fax an rx to Medicare.

## 2012-01-29 NOTE — Telephone Encounter (Signed)
During the office visit last week, I clarified with patient the reason that I would not approve additional home care hours. To reiterate, the patient admits that her only need is for someone to do her laundry and ironing. She already has 10 hours weekly which I think is adequate for laundry, so I elected not to approve more hours because I don't think medicare/taxpayers should be funding her expectation of someone else doing her ironing every day, which I think she could actually handle by herself. I think she mainly craves social interaction, but she refused any social activities offered by [employer]. I advised patient that we will continue to monitor her functional status and if anything deteriorates, we can readdress the need for home aide hours.

## 2012-01-30 ENCOUNTER — Telehealth: Payer: Self-pay | Admitting: Clinical

## 2012-01-30 ENCOUNTER — Ambulatory Visit: Payer: Medicare Other | Admitting: Physical Therapy

## 2012-01-30 NOTE — Telephone Encounter (Signed)
Clinical Child psychotherapist (CSW) contacted pt to inform her that her PCP did not feel comfortable completing the paperwork for additional caregiver hours. Pt understanding and appreciative of CSW call.  Theresia Bough, MSW, Theresia Majors [phone]

## 2012-02-04 ENCOUNTER — Ambulatory Visit: Payer: Medicare Other | Attending: Family Medicine | Admitting: Physical Therapy

## 2012-02-04 DIAGNOSIS — R269 Unspecified abnormalities of gait and mobility: Secondary | ICD-10-CM | POA: Insufficient documentation

## 2012-02-04 DIAGNOSIS — IMO0001 Reserved for inherently not codable concepts without codable children: Secondary | ICD-10-CM | POA: Insufficient documentation

## 2012-02-10 ENCOUNTER — Ambulatory Visit: Payer: Medicare Other | Admitting: Physical Therapy

## 2012-02-14 ENCOUNTER — Ambulatory Visit: Payer: Medicare Other | Attending: Family Medicine | Admitting: Physical Therapy

## 2012-02-14 DIAGNOSIS — IMO0001 Reserved for inherently not codable concepts without codable children: Secondary | ICD-10-CM | POA: Insufficient documentation

## 2012-02-14 DIAGNOSIS — R269 Unspecified abnormalities of gait and mobility: Secondary | ICD-10-CM | POA: Insufficient documentation

## 2012-02-18 ENCOUNTER — Ambulatory Visit: Payer: Medicare Other | Admitting: Physical Therapy

## 2012-02-18 DIAGNOSIS — R269 Unspecified abnormalities of gait and mobility: Secondary | ICD-10-CM | POA: Diagnosis not present

## 2012-02-18 DIAGNOSIS — IMO0001 Reserved for inherently not codable concepts without codable children: Secondary | ICD-10-CM | POA: Diagnosis not present

## 2012-02-25 ENCOUNTER — Ambulatory Visit: Payer: Medicare Other | Admitting: Physical Therapy

## 2012-02-25 DIAGNOSIS — IMO0001 Reserved for inherently not codable concepts without codable children: Secondary | ICD-10-CM | POA: Diagnosis not present

## 2012-02-25 DIAGNOSIS — R269 Unspecified abnormalities of gait and mobility: Secondary | ICD-10-CM | POA: Diagnosis not present

## 2012-02-27 ENCOUNTER — Ambulatory Visit: Payer: Medicare Other | Admitting: Physical Therapy

## 2012-02-27 DIAGNOSIS — R269 Unspecified abnormalities of gait and mobility: Secondary | ICD-10-CM | POA: Diagnosis not present

## 2012-02-27 DIAGNOSIS — IMO0001 Reserved for inherently not codable concepts without codable children: Secondary | ICD-10-CM | POA: Diagnosis not present

## 2012-03-03 ENCOUNTER — Encounter: Payer: Medicare Other | Admitting: Physical Therapy

## 2012-03-05 ENCOUNTER — Ambulatory Visit: Payer: Medicare Other | Admitting: Physical Therapy

## 2012-03-05 DIAGNOSIS — R269 Unspecified abnormalities of gait and mobility: Secondary | ICD-10-CM | POA: Diagnosis not present

## 2012-03-05 DIAGNOSIS — IMO0001 Reserved for inherently not codable concepts without codable children: Secondary | ICD-10-CM | POA: Diagnosis not present

## 2012-03-10 ENCOUNTER — Ambulatory Visit: Payer: Medicare Other | Admitting: Physical Therapy

## 2012-03-16 ENCOUNTER — Ambulatory Visit: Payer: Medicare Other | Attending: Family Medicine | Admitting: Physical Therapy

## 2012-03-16 DIAGNOSIS — R269 Unspecified abnormalities of gait and mobility: Secondary | ICD-10-CM | POA: Diagnosis not present

## 2012-03-16 DIAGNOSIS — IMO0001 Reserved for inherently not codable concepts without codable children: Secondary | ICD-10-CM | POA: Insufficient documentation

## 2012-03-20 ENCOUNTER — Ambulatory Visit: Payer: Medicare Other | Admitting: Physical Therapy

## 2012-03-23 DIAGNOSIS — H40059 Ocular hypertension, unspecified eye: Secondary | ICD-10-CM | POA: Diagnosis not present

## 2012-03-25 ENCOUNTER — Ambulatory Visit: Payer: Medicare Other | Admitting: Physical Therapy

## 2012-03-27 ENCOUNTER — Ambulatory Visit: Payer: Medicare Other | Admitting: Physical Therapy

## 2012-03-30 ENCOUNTER — Ambulatory Visit: Payer: Medicare Other | Admitting: Physical Therapy

## 2012-04-03 ENCOUNTER — Ambulatory Visit: Payer: Medicare Other | Admitting: Physical Therapy

## 2012-04-06 ENCOUNTER — Ambulatory Visit: Payer: Medicare Other | Admitting: Physical Therapy

## 2012-04-10 ENCOUNTER — Ambulatory Visit: Payer: Medicare Other | Admitting: Physical Therapy

## 2012-04-14 ENCOUNTER — Ambulatory Visit: Payer: Medicare Other | Admitting: Physical Therapy

## 2012-04-17 ENCOUNTER — Ambulatory Visit: Payer: Medicare Other | Admitting: Physical Therapy

## 2012-04-21 ENCOUNTER — Ambulatory Visit: Payer: Medicare Other | Attending: Family Medicine | Admitting: Physical Therapy

## 2012-04-21 DIAGNOSIS — R269 Unspecified abnormalities of gait and mobility: Secondary | ICD-10-CM | POA: Insufficient documentation

## 2012-04-21 DIAGNOSIS — IMO0001 Reserved for inherently not codable concepts without codable children: Secondary | ICD-10-CM | POA: Diagnosis not present

## 2012-04-24 ENCOUNTER — Ambulatory Visit: Payer: Medicare Other | Admitting: Physical Therapy

## 2012-04-27 DIAGNOSIS — M79609 Pain in unspecified limb: Secondary | ICD-10-CM | POA: Diagnosis not present

## 2012-04-27 DIAGNOSIS — B351 Tinea unguium: Secondary | ICD-10-CM | POA: Diagnosis not present

## 2012-04-28 ENCOUNTER — Ambulatory Visit: Payer: Medicare Other | Admitting: Physical Therapy

## 2012-05-01 ENCOUNTER — Ambulatory Visit: Payer: Medicare Other | Admitting: Physical Therapy

## 2012-05-04 ENCOUNTER — Ambulatory Visit: Payer: Medicare Other | Admitting: Physical Therapy

## 2012-05-08 ENCOUNTER — Ambulatory Visit: Payer: Medicare Other | Admitting: Physical Therapy

## 2012-05-11 ENCOUNTER — Ambulatory Visit: Payer: Medicare Other | Admitting: Physical Therapy

## 2012-05-13 ENCOUNTER — Encounter (HOSPITAL_COMMUNITY): Payer: Self-pay | Admitting: Vascular Surgery

## 2012-05-13 ENCOUNTER — Emergency Department (HOSPITAL_COMMUNITY): Payer: Medicare Other

## 2012-05-13 ENCOUNTER — Emergency Department (HOSPITAL_COMMUNITY)
Admission: EM | Admit: 2012-05-13 | Discharge: 2012-05-13 | Disposition: A | Discharge status: Home / Self Care | Payer: Medicare Other | Attending: Emergency Medicine | Admitting: Emergency Medicine

## 2012-05-13 DIAGNOSIS — S79919A Unspecified injury of unspecified hip, initial encounter: Secondary | ICD-10-CM | POA: Insufficient documentation

## 2012-05-13 DIAGNOSIS — N390 Urinary tract infection, site not specified: Secondary | ICD-10-CM | POA: Diagnosis not present

## 2012-05-13 DIAGNOSIS — Y9301 Activity, walking, marching and hiking: Secondary | ICD-10-CM | POA: Insufficient documentation

## 2012-05-13 DIAGNOSIS — F319 Bipolar disorder, unspecified: Secondary | ICD-10-CM | POA: Insufficient documentation

## 2012-05-13 DIAGNOSIS — M25569 Pain in unspecified knee: Secondary | ICD-10-CM | POA: Diagnosis not present

## 2012-05-13 DIAGNOSIS — S99929A Unspecified injury of unspecified foot, initial encounter: Secondary | ICD-10-CM | POA: Diagnosis not present

## 2012-05-13 DIAGNOSIS — M109 Gout, unspecified: Secondary | ICD-10-CM | POA: Diagnosis not present

## 2012-05-13 DIAGNOSIS — D649 Anemia, unspecified: Secondary | ICD-10-CM | POA: Insufficient documentation

## 2012-05-13 DIAGNOSIS — I129 Hypertensive chronic kidney disease with stage 1 through stage 4 chronic kidney disease, or unspecified chronic kidney disease: Secondary | ICD-10-CM | POA: Insufficient documentation

## 2012-05-13 DIAGNOSIS — N189 Chronic kidney disease, unspecified: Secondary | ICD-10-CM | POA: Insufficient documentation

## 2012-05-13 DIAGNOSIS — F259 Schizoaffective disorder, unspecified: Secondary | ICD-10-CM | POA: Diagnosis not present

## 2012-05-13 DIAGNOSIS — Z79899 Other long term (current) drug therapy: Secondary | ICD-10-CM | POA: Insufficient documentation

## 2012-05-13 DIAGNOSIS — W19XXXA Unspecified fall, initial encounter: Secondary | ICD-10-CM

## 2012-05-13 DIAGNOSIS — I10 Essential (primary) hypertension: Secondary | ICD-10-CM | POA: Diagnosis not present

## 2012-05-13 DIAGNOSIS — Z7982 Long term (current) use of aspirin: Secondary | ICD-10-CM | POA: Diagnosis not present

## 2012-05-13 DIAGNOSIS — G40909 Epilepsy, unspecified, not intractable, without status epilepticus: Secondary | ICD-10-CM | POA: Insufficient documentation

## 2012-05-13 DIAGNOSIS — S8990XA Unspecified injury of unspecified lower leg, initial encounter: Secondary | ICD-10-CM | POA: Diagnosis not present

## 2012-05-13 DIAGNOSIS — M79609 Pain in unspecified limb: Secondary | ICD-10-CM | POA: Diagnosis not present

## 2012-05-13 DIAGNOSIS — M25559 Pain in unspecified hip: Secondary | ICD-10-CM | POA: Diagnosis not present

## 2012-05-13 DIAGNOSIS — Z862 Personal history of diseases of the blood and blood-forming organs and certain disorders involving the immune mechanism: Secondary | ICD-10-CM | POA: Insufficient documentation

## 2012-05-13 DIAGNOSIS — S79929A Unspecified injury of unspecified thigh, initial encounter: Secondary | ICD-10-CM | POA: Diagnosis not present

## 2012-05-13 DIAGNOSIS — R209 Unspecified disturbances of skin sensation: Secondary | ICD-10-CM | POA: Diagnosis not present

## 2012-05-13 DIAGNOSIS — S0990XA Unspecified injury of head, initial encounter: Secondary | ICD-10-CM | POA: Diagnosis not present

## 2012-05-13 DIAGNOSIS — R296 Repeated falls: Secondary | ICD-10-CM | POA: Insufficient documentation

## 2012-05-13 DIAGNOSIS — Y92009 Unspecified place in unspecified non-institutional (private) residence as the place of occurrence of the external cause: Secondary | ICD-10-CM | POA: Insufficient documentation

## 2012-05-13 LAB — URINALYSIS, ROUTINE W REFLEX MICROSCOPIC
Bilirubin Urine: NEGATIVE
Ketones, ur: NEGATIVE mg/dL
Nitrite: NEGATIVE
Protein, ur: NEGATIVE mg/dL
pH: 6 (ref 5.0–8.0)

## 2012-05-13 LAB — DIFFERENTIAL
Eosinophils Relative: 0 % (ref 0–5)
Lymphocytes Relative: 38 % (ref 12–46)
Lymphs Abs: 1.9 10*3/uL (ref 0.7–4.0)
Monocytes Absolute: 0.5 10*3/uL (ref 0.1–1.0)
Monocytes Relative: 10 % (ref 3–12)
Neutro Abs: 2.6 10*3/uL (ref 1.7–7.7)

## 2012-05-13 LAB — COMPREHENSIVE METABOLIC PANEL
AST: 34 U/L (ref 0–37)
BUN: 19 mg/dL (ref 6–23)
CO2: 26 mEq/L (ref 19–32)
Calcium: 10.5 mg/dL (ref 8.4–10.5)
Chloride: 100 mEq/L (ref 96–112)
Creatinine, Ser: 1.18 mg/dL — ABNORMAL HIGH (ref 0.50–1.10)
GFR calc Af Amer: 52 mL/min — ABNORMAL LOW (ref >90)
GFR calc non Af Amer: 45 mL/min — ABNORMAL LOW (ref >90)
Glucose, Bld: 79 mg/dL (ref 70–99)
Total Bilirubin: 0.2 mg/dL — ABNORMAL LOW (ref 0.3–1.2)

## 2012-05-13 LAB — PROTIME-INR
INR: 0.94 (ref 0.00–1.49)
Prothrombin Time: 12.5 seconds (ref 11.6–15.2)

## 2012-05-13 LAB — CBC
HCT: 37.4 % (ref 36.0–46.0)
Hemoglobin: 12.6 g/dL (ref 12.0–15.0)
MCV: 84.2 fL (ref 78.0–100.0)
RDW: 16.1 % — ABNORMAL HIGH (ref 11.5–15.5)
WBC: 5 10*3/uL (ref 4.0–10.5)

## 2012-05-13 LAB — URINE MICROSCOPIC-ADD ON

## 2012-05-13 MED ORDER — LIDOCAINE HCL (PF) 1 % IJ SOLN
INTRAMUSCULAR | Status: AC
  Administered 2012-05-13: 0.9 mL
  Filled 2012-05-13: qty 5

## 2012-05-13 MED ORDER — CEFTRIAXONE SODIUM 1 G IJ SOLR: 1.0000 g | INTRAMUSCULAR | Status: DC

## 2012-05-13 MED ORDER — CEFTRIAXONE SODIUM 1 G IJ SOLR
1.0000 g | Freq: Once | INTRAMUSCULAR | Status: AC
  Administered 2012-05-13: 1 g via INTRAMUSCULAR
  Filled 2012-05-13: qty 10

## 2012-05-13 MED ORDER — CEFUROXIME AXETIL 250 MG PO TABS: 250.0000 mg | ORAL_TABLET | Freq: Two times a day (BID) | ORAL | Status: DC

## 2012-05-13 NOTE — ED Notes (Signed)
Pt reports to the ED via [facility] following a fall that occurred in her home today. Pt reports that she lost her balance and fell on her right side. Pt denies syncope, head injury, or LOC. Pt reports pain in her right hip, knee, and leg. Pt also reported right hand numbness. Pt was able to ambulate and get herself off the ground without assistance. Pt denies any neck or back pain. No deformity noted. CMS intact.

## 2012-05-13 NOTE — ED Notes (Signed)
Pt ambulated in hall with one staff assist. Pt ambulated well and without dizziness or significant difficulty.

## 2012-05-13 NOTE — ED Provider Notes (Signed)
History    CSN: [id number] Arrival date & time 05/13/12  1339 First MD Initiated Contact with Patient 05/13/12 1421      Chief Complaint  Patient presents with  . Fall    HPI Pt had a fall this am at home.  She has history of balance problems and lost her balance today.  SHe fell while walking in her home.  She fell landing on her right side.  She now has moderate pain in her right hip, knee and leg.  She has been able to walk on her own since the incident.  No back or neck pain.  No LOC.  No head injury. Pt is concerned about her medicaitons, clonazepam and risperdal contirubuting to her balance problmes.  She is scheduled to see her doctor on Friday to discuss these issues.  She is going to therapy already for her balance. Past Medical History  Diagnosis Date  . Depression   . Gout   . Bipolar 1 disorder   . Schizoaffective disorder   . Chronic kidney disease   . Hypertension   . Seizure disorder   . Thrombocytopenia   . Anemia     Past Surgical History  Procedure Date  . Tubal ligation   . Appendectomy     History reviewed. No pertinent family history.  History  Substance Use Topics  . Smoking status: Never Smoker   . Smokeless tobacco: Never Used  . Alcohol Use: No    OB History    Grav Para Term Preterm Abortions TAB SAB Ect Mult Living                  Review of Systems  All other systems reviewed and are negative.    Allergies  Ativan; Benztropine mesylate; Clonazepam; Lisinopril; Sulfonamide derivatives; and Vicodin  Home Medications   Current Outpatient Rx  Name  Route  Sig  Dispense  Refill  . ASPIRIN 81 MG PO CHEW   Oral   Chew 81 mg by mouth daily.          . B COMPLEX-C PO TABS   Oral   Take 1 tablet by mouth daily.         [name] CALCIUM CARBONATE-VITAMIN D 600-400 MG-UNIT PO TABS   Oral   Take 1 tablet by mouth 2 (two) times daily.          [name] CLONAZEPAM 0.5 MG PO TABS   Oral   Take 0.5 mg by mouth at bedtime as needed. For  Anxiety         . DILTIAZEM HCL ER COATED BEADS 120 MG PO CP24   Oral   Take 120 mg by mouth daily.          [name] DIVALPROEX SODIUM 500 MG PO TBEC   Oral   Take 500 mg by mouth 2 (two) times daily.          [name] FLAXSEED OIL PO   Oral   Take 1 capsule by mouth daily.         . ADULT MULTIVITAMIN W/MINERALS CH   Oral   Take 1 tablet by mouth daily. Centrum for women         . NAPROXEN SODIUM 220 MG PO TABS   Oral   Take 220 mg by mouth 2 (two) times daily as needed. pain         . FISH OIL 1000 MG PO CAPS   Oral   Take 1 capsule by  mouth at bedtime.          [name] POLYVINYL ALCOHOL 1.4 % OP SOLN   Both Eyes   Place 1 drop into both eyes as needed. For dry eyes         . RISPERIDONE 1 MG PO TABS   Oral   Take 0.5-1 mg by mouth 2 (two) times daily. 0.5mg  in the morning 1mg  at evening.         [name] VITAMIN E 400 UNITS PO CAPS   Oral   Take 400 Units by mouth at bedtime.            BP 165/76  Pulse 78  Temp 98.1 F (36.7 C) (Oral)  Resp 17  SpO2 99%  Physical Exam  Nursing note and vitals reviewed. Constitutional: She appears well-developed and well-nourished. No distress.  HENT:  Head: Normocephalic and atraumatic.  Right Ear: External ear normal.  Left Ear: External ear normal.  Eyes: Conjunctivae normal are normal. Right eye exhibits no discharge. Left eye exhibits no discharge. No scleral icterus.  Neck: Neck supple. No tracheal deviation present.  Cardiovascular: Normal rate, regular rhythm and intact distal pulses.   Pulmonary/Chest: Effort normal and breath sounds normal. No stridor. No respiratory distress. She has no wheezes. She has no rales.  Abdominal: Soft. Bowel sounds are normal. She exhibits no distension. There is no tenderness. There is no rebound and no guarding.  Musculoskeletal: She exhibits no edema and no tenderness.       Right hip: She exhibits tenderness. She exhibits normal range of motion, normal strength and no swelling.        Right knee: She exhibits no swelling, no effusion and no ecchymosis. tenderness found.  Neurological: She is alert. She has normal strength. No sensory deficit. Cranial nerve deficit:  no gross defecits noted. She exhibits normal muscle tone. She displays no seizure activity. Coordination normal.  Skin: Skin is warm and dry. No rash noted.  Psychiatric: She has a normal mood and affect.    ED Course  Procedures (including critical care time)  Rate: 72  Rhythm: normal sinus rhythm  QRS Axis: normal  Intervals: normal  ST/T Wave abnormalities: Nonspecific T wave changes  Conduction Disutrbances:none  Narrative Interpretation: Consider left ventricular hypertrophy  Old EKG Reviewed: none available  Labs Reviewed  CBC - Abnormal; Notable for the following:    RDW 16.1 (*)     All other components within normal limits  COMPREHENSIVE METABOLIC PANEL - Abnormal; Notable for the following:    Creatinine, Ser 1.18 (*)     Total Bilirubin 0.2 (*)     GFR calc non Af Amer 45 (*)     GFR calc Af Amer 52 (*)     All other components within normal limits  URINALYSIS, ROUTINE W REFLEX MICROSCOPIC - Abnormal; Notable for the following:    APPearance CLOUDY (*)     Hgb urine dipstick TRACE (*)     Leukocytes, UA LARGE (*)     All other components within normal limits  DIFFERENTIAL  PROTIME-INR  VALPROIC ACID LEVEL  URINE MICROSCOPIC-ADD ON  URINE CULTURE   Dg Hip Complete Right  05/13/2012  *RADIOLOGY REPORT*  Clinical Data: Status post fall.  Right hip pain.  RIGHT HIP - COMPLETE 2+ VIEW  Comparison: Right hip radiographs 01/01/2010.  Findings: The pelvis is intact.  Mild degenerative changes of the hips bilaterally have progressed.  The right hip is located.  No acute bone or  soft tissue abnormality is present.  IMPRESSION:  1.  Slight progression of degenerative changes of the hips bilaterally. 2.  No acute radiographic abnormality.   Original Report Authenticated By: Marin Roberts,  M.D.    Ct Head Wo Contrast  05/13/2012  *RADIOLOGY REPORT*  Clinical Data: Fall with upper extremity numbness.  CT HEAD WITHOUT CONTRAST  Technique:  Contiguous axial images were obtained from the base of the skull through the vertex without contrast.  Comparison: 06/21/2010  Findings: There is no evidence for acute hemorrhage, hydrocephalus, mass lesion, or abnormal extra-axial fluid collection.  No definite CT evidence for acute infarction.  The visualized paranasal sinuses and mastoid air cells are clear.  No evidence for skull fracture.  IMPRESSION: Stable exam.  No acute intracranial abnormality.   Original Report Authenticated By: Kennith Center, M.D.    Dg Knee Complete 4 Views Right  05/13/2012  *RADIOLOGY REPORT*  Clinical Data: Larey Seat.  Medial pain.  RIGHT KNEE - COMPLETE 4+ VIEW  Comparison: 12/20/2011  Findings:  no visible joint effusion.  No evidence of acute fracture.  There is chronic chondrocalcinosis.  Prominent fabella laterally.  IMPRESSION: No acute finding.  Chronic chondrocalcinosis.   Original Report Authenticated By: Paulina Fusi, M.D.      1. Fall   2. UTI (urinary tract infection)       MDM  The patient's laboratory evaluation and x-rays are unremarkable with the exception of urinary tract infection. This may be contributing to the weakness that she is having.  The patient's overall feeling well at this time. I will have her walk around in the emergency department to make sure she's not having any difficulty.  Pt has been encouraged to follow up with her PCP          Celene Kras, MD 05/13/12 1701

## 2012-05-14 ENCOUNTER — Telehealth (HOSPITAL_COMMUNITY): Payer: Self-pay | Admitting: Emergency Medicine

## 2012-05-14 DIAGNOSIS — F319 Bipolar disorder, unspecified: Secondary | ICD-10-CM | POA: Diagnosis not present

## 2012-05-15 ENCOUNTER — Ambulatory Visit: Payer: Medicare Other | Admitting: Physical Therapy

## 2012-05-15 LAB — URINE CULTURE: Colony Count: >=100000

## 2012-05-16 NOTE — ED Notes (Signed)
+  Urine. Patient treated with Ceftin. No sensitivity listed. Chart sent to EDP office for review.

## 2012-05-18 ENCOUNTER — Ambulatory Visit: Payer: Medicare Other | Admitting: Physical Therapy

## 2012-05-18 DIAGNOSIS — E7800 Pure hypercholesterolemia, unspecified: Secondary | ICD-10-CM | POA: Diagnosis not present

## 2012-05-18 DIAGNOSIS — I10 Essential (primary) hypertension: Secondary | ICD-10-CM | POA: Diagnosis not present

## 2012-05-19 NOTE — ED Notes (Signed)
Chart returned from EDP office .Continue current treatment as previously recommended . Follow up with PCP to ensure resolution of symptoms per Fayrene Helper

## 2012-05-22 ENCOUNTER — Ambulatory Visit: Payer: Medicare Other | Attending: Family Medicine | Admitting: Physical Therapy

## 2012-05-22 ENCOUNTER — Telehealth: Payer: Self-pay | Admitting: Family Medicine

## 2012-05-22 ENCOUNTER — Encounter (HOSPITAL_COMMUNITY): Payer: Self-pay | Admitting: Emergency Medicine

## 2012-05-22 ENCOUNTER — Emergency Department (HOSPITAL_COMMUNITY)
Admission: EM | Admit: 2012-05-22 | Discharge: 2012-05-22 | Disposition: A | Discharge status: Home / Self Care | Payer: Medicare Other | Attending: Emergency Medicine | Admitting: Emergency Medicine

## 2012-05-22 ENCOUNTER — Emergency Department (HOSPITAL_COMMUNITY): Payer: Medicare Other

## 2012-05-22 DIAGNOSIS — Z8639 Personal history of other endocrine, nutritional and metabolic disease: Secondary | ICD-10-CM | POA: Insufficient documentation

## 2012-05-22 DIAGNOSIS — R0602 Shortness of breath: Secondary | ICD-10-CM | POA: Diagnosis not present

## 2012-05-22 DIAGNOSIS — G40909 Epilepsy, unspecified, not intractable, without status epilepticus: Secondary | ICD-10-CM | POA: Insufficient documentation

## 2012-05-22 DIAGNOSIS — N189 Chronic kidney disease, unspecified: Secondary | ICD-10-CM | POA: Diagnosis not present

## 2012-05-22 DIAGNOSIS — R269 Unspecified abnormalities of gait and mobility: Secondary | ICD-10-CM | POA: Insufficient documentation

## 2012-05-22 DIAGNOSIS — F259 Schizoaffective disorder, unspecified: Secondary | ICD-10-CM | POA: Diagnosis not present

## 2012-05-22 DIAGNOSIS — Z862 Personal history of diseases of the blood and blood-forming organs and certain disorders involving the immune mechanism: Secondary | ICD-10-CM | POA: Diagnosis not present

## 2012-05-22 DIAGNOSIS — Z79899 Other long term (current) drug therapy: Secondary | ICD-10-CM | POA: Insufficient documentation

## 2012-05-22 DIAGNOSIS — R259 Unspecified abnormal involuntary movements: Secondary | ICD-10-CM | POA: Insufficient documentation

## 2012-05-22 DIAGNOSIS — R079 Chest pain, unspecified: Secondary | ICD-10-CM | POA: Diagnosis not present

## 2012-05-22 DIAGNOSIS — Z7982 Long term (current) use of aspirin: Secondary | ICD-10-CM | POA: Insufficient documentation

## 2012-05-22 DIAGNOSIS — I129 Hypertensive chronic kidney disease with stage 1 through stage 4 chronic kidney disease, or unspecified chronic kidney disease: Secondary | ICD-10-CM | POA: Insufficient documentation

## 2012-05-22 DIAGNOSIS — R251 Tremor, unspecified: Secondary | ICD-10-CM

## 2012-05-22 DIAGNOSIS — IMO0001 Reserved for inherently not codable concepts without codable children: Secondary | ICD-10-CM | POA: Insufficient documentation

## 2012-05-22 LAB — CBC WITH DIFFERENTIAL/PLATELET
Eosinophils Absolute: 0 10*3/uL (ref 0.0–0.7)
Eosinophils Relative: 0 % (ref 0–5)
Lymphs Abs: 2.3 10*3/uL (ref 0.7–4.0)
MCH: 28.3 pg (ref 26.0–34.0)
MCV: 84.2 fL (ref 78.0–100.0)
Platelets: 128 10*3/uL — ABNORMAL LOW (ref 150–400)
RDW: 16.4 % — ABNORMAL HIGH (ref 11.5–15.5)

## 2012-05-22 LAB — COMPREHENSIVE METABOLIC PANEL
ALT: 14 U/L (ref 0–35)
Calcium: 10.3 mg/dL (ref 8.4–10.5)
Creatinine, Ser: 1.12 mg/dL — ABNORMAL HIGH (ref 0.50–1.10)
GFR calc Af Amer: 55 mL/min — ABNORMAL LOW (ref >90)
Glucose, Bld: 101 mg/dL — ABNORMAL HIGH (ref 70–99)
Sodium: 135 mEq/L (ref 135–145)
Total Protein: 7.2 g/dL (ref 6.0–8.3)

## 2012-05-22 LAB — POCT I-STAT TROPONIN I: Troponin i, poc: 0.01 ng/mL (ref 0.00–0.08)

## 2012-05-22 LAB — TROPONIN I: Troponin I: <0.3 ng/mL (ref <0.30)

## 2012-05-22 LAB — VALPROIC ACID LEVEL: Valproic Acid Lvl: 57.4 ug/mL (ref 50.0–100.0)

## 2012-05-22 MED ORDER — DIVALPROEX SODIUM 250 MG PO DR TAB
500.0000 mg | DELAYED_RELEASE_TABLET | Freq: Once | ORAL | Status: AC
  Administered 2012-05-22: 500 mg via ORAL
  Filled 2012-05-22 (×2): qty 1

## 2012-05-22 NOTE — ED Notes (Signed)
PT. ARRIVED WITH EMS FROM HOME , REPORTS MID CHEST PAIN /PRESSURE RADIATING TO LEFT ARM AND LEFT NECK WITH SOB , DENIES NAUSEA OR DIAPHORESIS , RECEIVED 4 BABY ASA AND 1 NTG WITH TEMPORARY RELIEF - RATES PAIN 5/10 AT ARRIVAL.

## 2012-05-22 NOTE — ED Provider Notes (Signed)
History     CSN: [id number]  Arrival date & time 05/22/12  4098   First MD Initiated Contact with Patient 05/22/12 1958      Chief Complaint  Patient presents with  . Chest Pain    (Consider location/radiation/quality/duration/timing/severity/associated sxs/prior treatment) Patient is a 72 y.o. female presenting with chest pain. The history is provided by the patient.  Chest Pain The chest pain began 1 - 2 hours ago. Chest pain occurs intermittently. The chest pain is resolved. Associated with: spontaneous. The pain is currently at 0/10. The severity of the pain is mild. The quality of the pain is described as heavy and brief. The pain does not radiate. Chest pain is worsened by stress. Primary symptoms include shortness of breath. Pertinent negatives for primary symptoms include no fever, no fatigue, no wheezing, no palpitations, no abdominal pain, no nausea and no vomiting.  Pertinent negatives for associated symptoms include no lower extremity edema and no weakness. She tried nitroglycerin and aspirin for the symptoms. Risk factors include being elderly.  Pertinent negatives for past medical history include no CAD.     Past Medical History  Diagnosis Date  . Depression   . Gout   . Bipolar 1 disorder   . Schizoaffective disorder   . Chronic kidney disease   . Hypertension   . Seizure disorder   . Thrombocytopenia   . Anemia     Past Surgical History  Procedure Laterality Date  . Tubal ligation    . Appendectomy      No family history on file.  History  Substance Use Topics  . Smoking status: Never Smoker   . Smokeless tobacco: Never Used  . Alcohol Use: No    OB History   Grav Para Term Preterm Abortions TAB SAB Ect Mult Living                  Review of Systems  Constitutional: Negative for fever and fatigue.  HENT: Negative for congestion, rhinorrhea and postnasal drip.   Eyes: Negative for photophobia and visual disturbance.  Respiratory: Positive for  shortness of breath. Negative for chest tightness and wheezing.   Cardiovascular: Positive for chest pain. Negative for palpitations and leg swelling.  Gastrointestinal: Negative for nausea, vomiting, abdominal pain and diarrhea.  Genitourinary: Negative for urgency, frequency and difficulty urinating.  Musculoskeletal: Negative for back pain and arthralgias.  Skin: Negative for rash and wound.  Neurological: Positive for tremors. Negative for weakness and headaches.  Psychiatric/Behavioral: Negative for confusion and agitation.    Allergies  Ativan; Benztropine mesylate; Clonazepam; Lisinopril; Sulfonamide derivatives; and Vicodin  Home Medications   Current Outpatient Rx  Name  Route  Sig  Dispense  Refill  . aspirin (ASPIRIN CHILDRENS) 81 MG chewable tablet   Oral   Chew 81 mg by mouth daily.          . B Complex-C (B-COMPLEX WITH VITAMIN C) tablet   Oral   Take 1 tablet by mouth daily.         . Calcium Carbonate-Vitamin D (CALCIUM 600+D) 600-400 MG-UNIT per tablet   Oral   Take 1 tablet by mouth 2 (two) times daily.          . clonazePAM (KLONOPIN) 0.5 MG tablet   Oral   Take 0.5 mg by mouth at bedtime as needed. For Anxiety         . diltiazem (DILACOR XR) 180 MG 24 hr capsule   Oral   Take 180  mg by mouth daily.         . divalproex (DEPAKOTE) 500 MG EC tablet   Oral   Take 500 mg by mouth 2 (two) times daily.          . Flaxseed, Linseed, (FLAXSEED OIL PO)   Oral   Take 1 capsule by mouth daily.         . Multiple Vitamin (MULITIVITAMIN WITH MINERALS) TABS   Oral   Take 1 tablet by mouth daily. Centrum for women         . naproxen sodium (ANAPROX) 220 MG tablet   Oral   Take 220 mg by mouth 2 (two) times daily as needed. pain         . Omega-3 Fatty Acids (FISH OIL) 1000 MG CAPS   Oral   Take 1 capsule by mouth at bedtime.          . polyvinyl alcohol (LIQUIFILM TEARS) 1.4 % ophthalmic solution   Both Eyes   Place 1 drop into both  eyes as needed. For dry eyes         . risperiDONE (RISPERDAL) 1 MG tablet   Oral   Take 0.5-1 mg by mouth 2 (two) times daily. 0.5mg  in the morning 1mg  at evening.         . vitamin E 400 UNIT capsule   Oral   Take 400 Units by mouth at bedtime.            BP 127/59  Pulse 64  Temp(Src) 98.4 F (36.9 C) (Oral)  Resp 15  SpO2 99%  Physical Exam  Nursing note and vitals reviewed. Constitutional: She is oriented to person, place, and time. She appears well-developed and well-nourished. No distress.  HENT:  Head: Normocephalic and atraumatic.  Mouth/Throat: Oropharynx is clear and moist.  Eyes: EOM are normal. Pupils are equal, round, and reactive to light.  Neck: Normal range of motion. Neck supple.  Cardiovascular: Normal rate, regular rhythm, normal heart sounds and intact distal pulses.   Pulmonary/Chest: Effort normal and breath sounds normal. She has no wheezes. She has no rales.  Abdominal: Soft. Bowel sounds are normal. She exhibits no distension. There is no tenderness. There is no rebound and no guarding.  Musculoskeletal: Normal range of motion. She exhibits no edema and no tenderness.  Lymphadenopathy:    She has no cervical adenopathy.  Neurological: She is alert and oriented to person, place, and time. She displays normal reflexes. No cranial nerve deficit. She exhibits normal muscle tone. Coordination normal.  Skin: Skin is warm and dry. No rash noted.  Psychiatric: She has a normal mood and affect. Her behavior is normal.    ED Course  Procedures (including critical care time)   Date: 05/22/2012  Rate: 76  Rhythm: normal sinus rhythm  QRS Axis: normal  Intervals: normal  ST/T Wave abnormalities: nonspecific ST changes and nonspecific T wave changes  Conduction Disutrbances:none  Narrative Interpretation:   Old EKG Reviewed: changes noted    Labs Reviewed  CBC WITH DIFFERENTIAL - Abnormal; Notable for the following:    Hemoglobin 11.8 (*)     HCT 35.1 (*)    RDW 16.4 (*)    Platelets 128 (*)    All other components within normal limits  COMPREHENSIVE METABOLIC PANEL - Abnormal; Notable for the following:    Glucose, Bld 101 (*)    Creatinine, Ser 1.12 (*)    Total Bilirubin 0.2 (*)    GFR calc  non Af Amer 48 (*)    GFR calc Af Amer 55 (*)    All other components within normal limits  VALPROIC ACID LEVEL  TROPONIN I  POCT I-STAT TROPONIN I   Dg Chest 2 View  05/22/2012  *RADIOLOGY REPORT*  Clinical Data: Hypertension, chest pain  CHEST - 2 VIEW  Comparison: Chest radiograph 01/09/2012  Findings: Normal mediastinum and cardiac silhouette.  Normal pulmonary  vasculature.  No evidence of effusion, infiltrate, or pneumothorax.  No acute bony abnormality.  IMPRESSION: Normal chest radiograph for age.   Original Report Authenticated By: Genevive Bi, M.D.      1. Chest pain   2. Episode of shaking       MDM  47F with pmhx of seizures, schizoaffective disorder, HTN here with a "shaking episode" today while walking. She never lost consciousness and did not fall to the ground. This episode lasted for a short time as she was trying to walk with her walker. Symptoms stopped spontaneously. She then developed chest pain that was left sided and radiated to her neck with associated dyspnea. That only lasted for about an hour and has now resolved. Pt also reports that she has been very anxious lately because she has been working with PT to help with her walking. This has made her very stressed out and anxious.  EMS gave her ASA and SL nitro. No history of CAD per patient. Exam as noted above. No focal neurologic deficits. EKG non-specific changes. Initial troponin negative. Will obtain second set. CXR without acute changes as well. Do not think this is ACS, PE, or dissection. Also, do not think this is c/w seizure or partial seizure. I feel this may be stress-induced. Will await the second troponin and obtain a Depakote level.  Second  troponin negative. Depakote level WNL. Given her nightly dose of Depakote in ED. Pt remains chest pain free and felt stable for d/c home. Return precautions given.      Johnnette Gourd, MD 05/23/12 [phone]

## 2012-05-22 NOTE — Telephone Encounter (Signed)
Pt is asking to speak to nurse - she thinks she is being over medicated and wants to know what to do.

## 2012-05-22 NOTE — Telephone Encounter (Signed)
"  Doesn't know what's going on my my body."  BP med (Diltiazem) was increased by Dr. Mayford Knife.  Patient started feeling "down and out of it" since increasing med.  Has not had BP checked since increasing BP med.  No headache, dizziness, or blurred vision.  Patient has a follow-up appt with Dr. Mayford Knife on Tuesday (05/26/12).  Patient advised to call Dr. Norris Cross office to discuss medication since their office increased the med.  Patient verbalized understanding.  Gaylene Brooks, RN

## 2012-05-22 NOTE — ED Notes (Signed)
Patient transported to X-ray 

## 2012-05-25 ENCOUNTER — Ambulatory Visit: Payer: Medicare Other | Admitting: Physical Therapy

## 2012-05-25 DIAGNOSIS — IMO0001 Reserved for inherently not codable concepts without codable children: Secondary | ICD-10-CM | POA: Diagnosis not present

## 2012-05-25 DIAGNOSIS — R269 Unspecified abnormalities of gait and mobility: Secondary | ICD-10-CM | POA: Diagnosis not present

## 2012-05-25 NOTE — ED Provider Notes (Signed)
I have supervised the resident on the management of this patient and agree with the note above. I personally interviewed and examined the patient and my addendum is below.   [name] is a 72 y.o. female hx of seizures, HTN, here with "shaking episode" while walking. No LOC, then had chest pain. Felt better after getting ASA and nitro by EMS. EKG showed nonspecific changes. Trop neg x 2. Depakote level nl, doubt seizure. D/c home in stable condition.    Richardean Canal, MD 05/25/12 1524

## 2012-05-26 DIAGNOSIS — I10 Essential (primary) hypertension: Secondary | ICD-10-CM | POA: Diagnosis not present

## 2012-05-29 ENCOUNTER — Ambulatory Visit: Payer: Medicare Other | Admitting: Physical Therapy

## 2012-06-04 ENCOUNTER — Encounter: Payer: Self-pay | Admitting: Family Medicine

## 2012-06-04 ENCOUNTER — Ambulatory Visit (INDEPENDENT_AMBULATORY_CARE_PROVIDER_SITE_OTHER): Payer: Medicare Other | Admitting: Family Medicine

## 2012-06-04 VITALS — BP 135/65 | HR 80 | Temp 98.1°F | Ht 63.0 in | Wt 150.0 lb

## 2012-06-04 DIAGNOSIS — R569 Unspecified convulsions: Secondary | ICD-10-CM | POA: Diagnosis not present

## 2012-06-04 DIAGNOSIS — R269 Unspecified abnormalities of gait and mobility: Secondary | ICD-10-CM | POA: Diagnosis not present

## 2012-06-04 DIAGNOSIS — M25569 Pain in unspecified knee: Secondary | ICD-10-CM | POA: Diagnosis not present

## 2012-06-04 DIAGNOSIS — I10 Essential (primary) hypertension: Secondary | ICD-10-CM

## 2012-06-04 DIAGNOSIS — R0789 Other chest pain: Secondary | ICD-10-CM

## 2012-06-04 NOTE — Patient Instructions (Addendum)
OK to use your acetaminophen 650 mg every 6 hours. You can use aleve as needed, but not every day to protect your kidneys. Avoid excess salt. Will try to get records from Dr. Mayford Knife. Think about going down on your medications. Will try to get new rolling walker. Make appointment for 2 months.  If your knee pain doesn't go away, then return to clinic sooner.

## 2012-06-05 ENCOUNTER — Ambulatory Visit: Payer: Medicare Other | Admitting: Physical Therapy

## 2012-06-05 DIAGNOSIS — IMO0001 Reserved for inherently not codable concepts without codable children: Secondary | ICD-10-CM | POA: Diagnosis not present

## 2012-06-05 NOTE — Assessment & Plan Note (Signed)
At goal today. Ok to continue increased diltiazem dose if no orthostatic symptoms.

## 2012-06-05 NOTE — Assessment & Plan Note (Signed)
Has OA and perhaps a subclinical bakers cyst causing pain. No significant edema/effusion or redness to indicate VTE, infection or gout. She desires to continue treatment with OTC analgesics and RTC if not improving for further evaluation. Advised to seek care for swelling, redness or fever. Discussed limits of acetaminophen being 3000mg  and she should rather avoid aleve for renal effects.

## 2012-06-05 NOTE — Assessment & Plan Note (Signed)
ED visit 2012 and 2013 associated with possible anxiety attack type prodrome. Followed by Dr. Mayford Knife at [facility] [place]. Had negative myoview 2009, perhaps more recent will try to obtain records. Currently no pain with exertion and negative ischemic evaluation in ED, no indication for urgent testing currently.

## 2012-06-05 NOTE — Assessment & Plan Note (Signed)
Patient does not think her recent episode was seizure, more likely related to anxiety. Will continue current depakote dose. F/u in 2 months.

## 2012-06-05 NOTE — Assessment & Plan Note (Signed)
RX for rolling walker with seat given due to her brake failing on current walker. Rx Potty chair also. Vitamin D level was 51 last year, she is taking daily supplement. F/u in 2 months or sooner if needed.

## 2012-06-05 NOTE — Progress Notes (Signed)
  Subjective:    Patient ID: [name], female    DOB: 03-04-1941, 72 y.o.   MRN: [id number]  HPI ED follow up. Seen 2/7 and 1/29 for shaking episode/atypical chest pain. Felt to be related to med changes and emotional stress with negative EKG, trop.  1. HTN. patient sees Dr. Mayford Knife at [facility] and recently her diltiazem was increased. Patient reports having a stress test done more recently than 2009 (I see a normal myoview scanned into epic from 2009).  Patient does not report lightheadedness, syncope, palpitations, weakness, chest pain, dyspnea with exertion.   2. Frequent falls/gait abnormality. She has been participating in PT for frequent falls. Does not report recent falls, even after her 'shaking episode'. She does request a new rx for rolling walker with seat because her brake is broken and she states that [facility] could not fix it because the model was discontinued and cannot get replacement parts.  Patient denies vertigo.  3. Right knee pain. This is a chronic and stable problem she is noticing. Pain is behind her knee currently. Worse with walking, but also notices mildly when seated. She is afraid to take too much tylenol because of TV commercials, but she is taking aleve BID. She does not report any recent injury, knee effusion, redness, fever, calf swelling/pain, rash, foot swelling.  4. Schizoaffective disorder/seizures. Patient blames her recent ED visit for a shaking episode with anxiety and chest pain on stress and anxiety. It hasn't occurred in past 3 weeks. Continues to take depakote at stable dose. Her last level was low end of therapeutic. Her psychiatrist Dr. Donell Beers is trying to wean down the risperdal she reports. Her mood is stable recently. No seizure activity.   Review of Systems See HPI otherwise negative.  reports that she has never smoked. She has never used smokeless tobacco.     Objective:   Physical Exam  Vitals reviewed. Constitutional: She is oriented to  person, place, and time. She appears well-developed and well-nourished. No distress.  HENT:  Head: Normocephalic and atraumatic.  Mouth/Throat: Oropharynx is clear and moist.  Eyes: EOM are normal.  Neck: Neck supple.  Cardiovascular: Normal rate, regular rhythm and normal heart sounds.   Pulmonary/Chest: Effort normal.  Musculoskeletal:  Right leg TTP in popliteal fossa. No warmth, effusion, edema, redness, calf pain, edema or joint line tenderness.  ROM intact in knee.  Gait with rolling walker.   Neurological: She is alert and oriented to person, place, and time. No cranial nerve deficit. Coordination normal.  Skin: She is not diaphoretic.  Psychiatric:  Smiles. Mood euthymic.          Assessment & Plan:

## 2012-06-10 ENCOUNTER — Other Ambulatory Visit: Payer: Self-pay | Admitting: Family Medicine

## 2012-06-10 ENCOUNTER — Telehealth: Payer: Self-pay | Admitting: Family Medicine

## 2012-06-10 MED ORDER — NAPROXEN SODIUM 220 MG PO TABS: 220.0000 mg | ORAL_TABLET | Freq: Every day | ORAL | Status: DC | PRN

## 2012-06-10 NOTE — Telephone Encounter (Signed)
Patient is calling because her pharmacy, [facility], did not get the Rx for pain medication on 2/20.  It was supposed to be faxed to them so that it could be delivered to her tomorrow.  She needs this taken care of today.

## 2012-06-10 NOTE — Telephone Encounter (Signed)
The only meds we discussed were aleve (which I encouraged her to avoid overuse of) and tylenol which she can get OTC. I will refill the aleve once more, but not to be taken scheduled daily.

## 2012-06-16 ENCOUNTER — Telehealth: Payer: Self-pay | Admitting: Family Medicine

## 2012-06-16 NOTE — Telephone Encounter (Signed)
Patient dropped off a paper with information about her getting a commode chair.  She would like you to submit the information to [place] [facility].  Fax number is [phone].  Place paper in dr's box.

## 2012-06-17 NOTE — Telephone Encounter (Signed)
Called patient to clarify. She does have a toilet in her apartment complex on her same level in home, but is difficult to get to due to urgency and her required ambulation with a walker. She has frequent falls, and has occurred when she is trying to get to restroom.  She desires a bedside commode, and was recommended by PT, most likely this would be beneficial to avoid dangerous transfers that are rushed, in setting of her gait abnormality and mobility issues, frequent falls.

## 2012-06-25 NOTE — Telephone Encounter (Signed)
Pt is asking status of her potty chair - hasn't heard anything from anyone.

## 2012-06-26 NOTE — Telephone Encounter (Signed)
I already sent the paper rx to DME company, they requested additional documentation which I also faxed my documentation to the DME company as requested. (previous phone note and office visit). Most likely this won't be covered based on medicare's guidelines, so I suggest she ask the DME company if it was approved or not.

## 2012-06-26 NOTE — Telephone Encounter (Signed)
Can you just write out a rx for the potty chair I can fax over to them instead of trying to find the paperwork

## 2012-07-09 ENCOUNTER — Telehealth: Payer: Self-pay | Admitting: Family Medicine

## 2012-07-09 NOTE — Telephone Encounter (Signed)
Patient is calling to let Dr. Cristal Ford know that she has heard anything about or received her pottie chair and she would like Dr. Cristal Ford to let her know the status of it.

## 2012-07-10 NOTE — Telephone Encounter (Signed)
Please see last phone message regarding this. She needs to contact the DME company, because I faxed the clinical information that was requested earlier this month. She may not qualify based on the medicare criteria. Can white team inform her to contact the company she submitted the prescription to? I already forwarded the clinical documentation.

## 2012-07-13 DIAGNOSIS — M79609 Pain in unspecified limb: Secondary | ICD-10-CM | POA: Diagnosis not present

## 2012-07-13 DIAGNOSIS — B351 Tinea unguium: Secondary | ICD-10-CM | POA: Diagnosis not present

## 2012-07-14 NOTE — Telephone Encounter (Signed)
I signed an order from [facility] and placed in fax outbox today.

## 2012-07-16 ENCOUNTER — Emergency Department (HOSPITAL_COMMUNITY)
Admission: EM | Admit: 2012-07-16 | Discharge: 2012-07-16 | Disposition: A | Discharge status: Home / Self Care | Payer: Medicare Other | Attending: Emergency Medicine | Admitting: Emergency Medicine

## 2012-07-16 ENCOUNTER — Encounter (HOSPITAL_COMMUNITY): Payer: Self-pay | Admitting: Emergency Medicine

## 2012-07-16 DIAGNOSIS — I129 Hypertensive chronic kidney disease with stage 1 through stage 4 chronic kidney disease, or unspecified chronic kidney disease: Secondary | ICD-10-CM | POA: Diagnosis not present

## 2012-07-16 DIAGNOSIS — F319 Bipolar disorder, unspecified: Secondary | ICD-10-CM | POA: Diagnosis not present

## 2012-07-16 DIAGNOSIS — R404 Transient alteration of awareness: Secondary | ICD-10-CM | POA: Diagnosis not present

## 2012-07-16 DIAGNOSIS — D696 Thrombocytopenia, unspecified: Secondary | ICD-10-CM | POA: Insufficient documentation

## 2012-07-16 DIAGNOSIS — Z862 Personal history of diseases of the blood and blood-forming organs and certain disorders involving the immune mechanism: Secondary | ICD-10-CM | POA: Diagnosis not present

## 2012-07-16 DIAGNOSIS — R5381 Other malaise: Secondary | ICD-10-CM | POA: Insufficient documentation

## 2012-07-16 DIAGNOSIS — R42 Dizziness and giddiness: Secondary | ICD-10-CM | POA: Diagnosis not present

## 2012-07-16 DIAGNOSIS — G40909 Epilepsy, unspecified, not intractable, without status epilepticus: Secondary | ICD-10-CM | POA: Diagnosis not present

## 2012-07-16 DIAGNOSIS — Z7982 Long term (current) use of aspirin: Secondary | ICD-10-CM | POA: Diagnosis not present

## 2012-07-16 DIAGNOSIS — F259 Schizoaffective disorder, unspecified: Secondary | ICD-10-CM | POA: Insufficient documentation

## 2012-07-16 DIAGNOSIS — Z8639 Personal history of other endocrine, nutritional and metabolic disease: Secondary | ICD-10-CM | POA: Insufficient documentation

## 2012-07-16 DIAGNOSIS — N189 Chronic kidney disease, unspecified: Secondary | ICD-10-CM | POA: Insufficient documentation

## 2012-07-16 DIAGNOSIS — Z79899 Other long term (current) drug therapy: Secondary | ICD-10-CM | POA: Diagnosis not present

## 2012-07-16 LAB — CBC WITH DIFFERENTIAL/PLATELET
Basophils Absolute: 0 10*3/uL (ref 0.0–0.1)
Eosinophils Relative: 0 % (ref 0–5)
HCT: 34.4 % — ABNORMAL LOW (ref 36.0–46.0)
Hemoglobin: 11.7 g/dL — ABNORMAL LOW (ref 12.0–15.0)
Lymphocytes Relative: 55 % — ABNORMAL HIGH (ref 12–46)
Lymphs Abs: 2.8 10*3/uL (ref 0.7–4.0)
MCV: 83.5 fL (ref 78.0–100.0)
Monocytes Absolute: 0.4 10*3/uL (ref 0.1–1.0)
Monocytes Relative: 8 % (ref 3–12)
Neutro Abs: 1.9 10*3/uL (ref 1.7–7.7)
RDW: 16.1 % — ABNORMAL HIGH (ref 11.5–15.5)
WBC: 5.1 10*3/uL (ref 4.0–10.5)

## 2012-07-16 LAB — BASIC METABOLIC PANEL
CO2: 27 mEq/L (ref 19–32)
Chloride: 104 mEq/L (ref 96–112)
Creatinine, Ser: 1.07 mg/dL (ref 0.50–1.10)
Potassium: 3.8 mEq/L (ref 3.5–5.1)

## 2012-07-16 LAB — URINE MICROSCOPIC-ADD ON

## 2012-07-16 LAB — URINALYSIS, ROUTINE W REFLEX MICROSCOPIC
Bilirubin Urine: NEGATIVE
Glucose, UA: NEGATIVE mg/dL
Hgb urine dipstick: NEGATIVE
Ketones, ur: NEGATIVE mg/dL
Specific Gravity, Urine: 1.007 (ref 1.005–1.030)
pH: 7.5 (ref 5.0–8.0)

## 2012-07-16 NOTE — ED Notes (Signed)
Per ems- pt woke up from sleep and felt dizzy with generalized weakness. Upon ems arrival pt was warm and clammy. bp 162/92 HR 84. cbg- 130. Pt denies being around anyone sick, nvd. Pt having trouble sitting up which she states happens from time to time. Pt a&ox4, nad at this time. Pt states she feels better now.

## 2012-07-16 NOTE — ED Notes (Signed)
Lab at bedside

## 2012-07-16 NOTE — ED Provider Notes (Signed)
History     CSN: [id number]  Arrival date & time 07/16/12  0052   First MD Initiated Contact with Patient 07/16/12 0116      Chief Complaint  Patient presents with  . Dizziness     HPI Dizziness Onset - tonight Course - improving Worsened by - standing up Improved by - sitting  Pt reports she went to bed last night and felt well and at her baseline She reports she woke up to use bathroom (she has bedside toilet) and she reports "jumping out of bed" and feel dizzy and weak.  She did not have full LOC.  She denies falls.  She denies HA/visual changes/focal weakness/cp.  No vomiting/diarrhea.  She then called 911.  She now feels improved.  No new meds.  She had otherwise been at her baseline  Past Medical History  Diagnosis Date  . Depression   . Gout   . Bipolar 1 disorder   . Schizoaffective disorder   . Chronic kidney disease   . Hypertension   . Seizure disorder   . Thrombocytopenia   . Anemia     Past Surgical History  Procedure Laterality Date  . Tubal ligation    . Appendectomy      No family history on file.  History  Substance Use Topics  . Smoking status: Never Smoker   . Smokeless tobacco: Never Used  . Alcohol Use: No    OB History   Grav Para Term Preterm Abortions TAB SAB Ect Mult Living                  Review of Systems  Respiratory: Negative for cough.   Gastrointestinal: Negative for vomiting, diarrhea and blood in stool.  Musculoskeletal: Negative for back pain.  Neurological: Positive for dizziness.  All other systems reviewed and are negative.    Allergies  Ativan; Benztropine mesylate; Clonazepam; Lisinopril; Sulfonamide derivatives; and Vicodin  Home Medications   Current Outpatient Rx  Name  Route  Sig  Dispense  Refill  . acetaminophen (TYLENOL) 325 MG tablet   Oral   Take 325 mg by mouth every 6 (six) hours as needed for pain.         [name] aspirin (ASPIRIN CHILDRENS) 81 MG chewable tablet   Oral   Chew 81 mg by mouth  daily.          . B Complex-C (B-COMPLEX WITH VITAMIN C) tablet   Oral   Take 1 tablet by mouth daily.         . Calcium Carbonate-Vitamin D (CALCIUM 600+D) 600-400 MG-UNIT per tablet   Oral   Take 1 tablet by mouth 2 (two) times daily.          . clonazePAM (KLONOPIN) 0.5 MG tablet   Oral   Take 0.5 mg by mouth at bedtime.          [name] diltiazem (DILACOR XR) 180 MG 24 hr capsule   Oral   Take 180 mg by mouth daily.         . divalproex (DEPAKOTE) 500 MG EC tablet   Oral   Take 500 mg by mouth 2 (two) times daily.          . Flaxseed, Linseed, (FLAXSEED OIL PO)   Oral   Take 1 capsule by mouth daily.         . Multiple Vitamin (MULITIVITAMIN WITH MINERALS) TABS   Oral   Take 1 tablet by mouth daily. Centrum for  women         . Omega-3 Fatty Acids (FISH OIL) 1000 MG CAPS   Oral   Take 1 capsule by mouth at bedtime.          . polyvinyl alcohol (LIQUIFILM TEARS) 1.4 % ophthalmic solution   Both Eyes   Place 1 drop into both eyes as needed. For dry eyes         . risperiDONE (RISPERDAL) 1 MG tablet   Oral   Take 0.5-1 mg by mouth 2 (two) times daily. 0.5mg  in the morning 1mg  at evening.         . vitamin E 400 UNIT capsule   Oral   Take 400 Units by mouth at bedtime.            BP 144/64  Pulse 77  Temp(Src) 97.9 F (36.6 C) (Oral)  Resp 16  SpO2 100%  Physical Exam CONSTITUTIONAL: Well developed/well nourished HEAD: Normocephalic/atraumatic EYES: EOMI/PERRL ENMT: Mucous membranes moist NECK: supple no meningeal signs CV: S1/S2 noted, no murmurs/rubs/gallops noted LUNGS: Lungs are clear to auscultation bilaterally, no apparent distress ABDOMEN: soft, nontender, no rebound or guarding GU:no cva tenderness NEURO: Pt is awake/alert, moves all extremitiesx4, no arm or leg drift.  No facial droop is noted EXTREMITIES: pulses normal, full ROM, no tenderness and no deformity noted to her lower extremities SKIN: warm, color normal PSYCH:  no abnormalities of mood noted  ED Course  Procedures  2:36 AM Pt presents for dizziness that seemed exacerbated by standing up.  She is now improving.  Will check labs and reassess.  Do not feel neuroimaging is needed at this time  3:48 AM Pt feels at baseline.  She wants to be discharged.  She is ambulatory at her baseline here in the ED Discussed strict return precautions   MDM  Nursing notes including past medical history and social history reviewed and considered in documentation Labs/vital reviewed and considered        Date: 07/16/2012  Rate: 78  Rhythm: normal sinus rhythm  QRS Axis: normal  Intervals: normal  ST/T Wave abnormalities: nonspecific ST changes  Conduction Disutrbances:none  Narrative Interpretation:   Old EKG Reviewed: unchanged    Joya Gaskins, MD 07/16/12 [phone]

## 2012-07-22 NOTE — Telephone Encounter (Signed)
Pt called to say that [facility] never rec'd order and I pulled from 07/14/12 fax file and refaxed it to [phone]

## 2012-07-27 ENCOUNTER — Telehealth: Payer: Self-pay | Admitting: Family Medicine

## 2012-07-27 DIAGNOSIS — R269 Unspecified abnormalities of gait and mobility: Secondary | ICD-10-CM

## 2012-07-27 NOTE — Telephone Encounter (Signed)
Patient just called [facility] and has been told again that the order for the [place] Chair was not received again.  She really needs resolution to this.

## 2012-07-27 NOTE — Telephone Encounter (Signed)
I thought this was handled last week?

## 2012-07-28 NOTE — Telephone Encounter (Signed)
Orders entered in Epic.  Called to [facility] with [facility].  Ileana Ladd

## 2012-07-29 ENCOUNTER — Emergency Department (HOSPITAL_COMMUNITY): Payer: Medicare Other

## 2012-07-29 ENCOUNTER — Emergency Department (HOSPITAL_COMMUNITY)
Admission: EM | Admit: 2012-07-29 | Discharge: 2012-07-29 | Disposition: A | Discharge status: Home / Self Care | Payer: Medicare Other | Attending: Emergency Medicine | Admitting: Emergency Medicine

## 2012-07-29 ENCOUNTER — Encounter (HOSPITAL_COMMUNITY): Payer: Self-pay | Admitting: *Deleted

## 2012-07-29 DIAGNOSIS — I129 Hypertensive chronic kidney disease with stage 1 through stage 4 chronic kidney disease, or unspecified chronic kidney disease: Secondary | ICD-10-CM | POA: Diagnosis not present

## 2012-07-29 DIAGNOSIS — Z7982 Long term (current) use of aspirin: Secondary | ICD-10-CM | POA: Diagnosis not present

## 2012-07-29 DIAGNOSIS — Z862 Personal history of diseases of the blood and blood-forming organs and certain disorders involving the immune mechanism: Secondary | ICD-10-CM | POA: Diagnosis not present

## 2012-07-29 DIAGNOSIS — R609 Edema, unspecified: Secondary | ICD-10-CM | POA: Insufficient documentation

## 2012-07-29 DIAGNOSIS — R0789 Other chest pain: Secondary | ICD-10-CM | POA: Diagnosis not present

## 2012-07-29 DIAGNOSIS — F319 Bipolar disorder, unspecified: Secondary | ICD-10-CM | POA: Insufficient documentation

## 2012-07-29 DIAGNOSIS — R071 Chest pain on breathing: Secondary | ICD-10-CM | POA: Diagnosis not present

## 2012-07-29 DIAGNOSIS — Z8669 Personal history of other diseases of the nervous system and sense organs: Secondary | ICD-10-CM | POA: Diagnosis not present

## 2012-07-29 DIAGNOSIS — F329 Major depressive disorder, single episode, unspecified: Secondary | ICD-10-CM | POA: Insufficient documentation

## 2012-07-29 DIAGNOSIS — Z8639 Personal history of other endocrine, nutritional and metabolic disease: Secondary | ICD-10-CM | POA: Insufficient documentation

## 2012-07-29 DIAGNOSIS — F3289 Other specified depressive episodes: Secondary | ICD-10-CM | POA: Insufficient documentation

## 2012-07-29 DIAGNOSIS — F259 Schizoaffective disorder, unspecified: Secondary | ICD-10-CM | POA: Diagnosis not present

## 2012-07-29 DIAGNOSIS — Z79899 Other long term (current) drug therapy: Secondary | ICD-10-CM | POA: Insufficient documentation

## 2012-07-29 DIAGNOSIS — R079 Chest pain, unspecified: Secondary | ICD-10-CM

## 2012-07-29 DIAGNOSIS — N189 Chronic kidney disease, unspecified: Secondary | ICD-10-CM | POA: Insufficient documentation

## 2012-07-29 LAB — COMPREHENSIVE METABOLIC PANEL
ALT: 14 U/L (ref 0–35)
Albumin: 3.2 g/dL — ABNORMAL LOW (ref 3.5–5.2)
Alkaline Phosphatase: 49 U/L (ref 39–117)
BUN: 14 mg/dL (ref 6–23)
Chloride: 105 mEq/L (ref 96–112)
Glucose, Bld: 100 mg/dL — ABNORMAL HIGH (ref 70–99)
Potassium: 4.1 mEq/L (ref 3.5–5.1)
Sodium: 142 mEq/L (ref 135–145)
Total Bilirubin: 0.1 mg/dL — ABNORMAL LOW (ref 0.3–1.2)
Total Protein: 6.6 g/dL (ref 6.0–8.3)

## 2012-07-29 LAB — CBC WITH DIFFERENTIAL/PLATELET
Eosinophils Absolute: 0 10*3/uL (ref 0.0–0.7)
Hemoglobin: 10.9 g/dL — ABNORMAL LOW (ref 12.0–15.0)
Lymphs Abs: 2.3 10*3/uL (ref 0.7–4.0)
MCH: 28.7 pg (ref 26.0–34.0)
Monocytes Relative: 8 % (ref 3–12)
Neutro Abs: 2.2 10*3/uL (ref 1.7–7.7)
Neutrophils Relative %: 45 % (ref 43–77)
Platelets: 119 10*3/uL — ABNORMAL LOW (ref 150–400)
RBC: 3.8 MIL/uL — ABNORMAL LOW (ref 3.87–5.11)
WBC: 4.9 10*3/uL (ref 4.0–10.5)

## 2012-07-29 MED ORDER — SODIUM CHLORIDE 0.9 % IV SOLN: Freq: Once | INTRAVENOUS | Status: DC

## 2012-07-29 NOTE — ED Notes (Signed)
Pt eating breakfast tray, will be discharged soon. Calling for ride. Pt a x 4. In NAD

## 2012-07-29 NOTE — ED Notes (Signed)
Pt resting comfortably in bed, watching TV. States "hunger pains" at epigastrium. A x 4

## 2012-07-29 NOTE — ED Provider Notes (Signed)
History     CSN: [id number]  Arrival date & time 07/29/12  0621   First MD Initiated Contact with Patient 07/29/12 [phone]      Chief Complaint  Patient presents with  . Chest Pain    (Consider location/radiation/quality/duration/timing/severity/associated sxs/prior treatment) Patient is a 72 y.o. female presenting with chest pain. The history is provided by the patient.  Chest Pain Pain location:  L chest Pain quality: aching and pressure   Pain radiates to:  Does not radiate Pain radiates to the back: no   Pain severity:  Mild Associated symptoms: no abdominal pain, no fever, no nausea, no numbness and no shortness of breath   Associated symptoms comment:  She woke at 2:00 a.m. with pain in left chest that lasted about one hour before decreasing enough to go back to sleep. No SOB, N, V, diaphoresis. She again woke at 5:00 a.m. with similar pain, also without SOB, N, V, diaphoresis. Per EMS reports, pain resolved after one SL NTG. No recent cough or fever. She reports a remote cardiac catheterization in 2005 that was normal. She sees Dr. Carolanne Grumbling once per year for routine follow up.   Past Medical History  Diagnosis Date  . Depression   . Gout   . Bipolar 1 disorder   . Schizoaffective disorder   . Chronic kidney disease   . Hypertension   . Seizure disorder   . Thrombocytopenia   . Anemia     Past Surgical History  Procedure Laterality Date  . Tubal ligation    . Appendectomy      History reviewed. No pertinent family history.  History  Substance Use Topics  . Smoking status: Never Smoker   . Smokeless tobacco: Never Used  . Alcohol Use: No    OB History   Grav Para Term Preterm Abortions TAB SAB Ect Mult Living                  Review of Systems  Constitutional: Negative for fever.  Respiratory: Negative for shortness of breath.   Cardiovascular: Positive for chest pain. Negative for leg swelling.  Gastrointestinal: Negative for nausea and abdominal  pain.  Genitourinary: Negative for dysuria.  Neurological: Negative for syncope and numbness.    Allergies  Ativan; Benztropine mesylate; Clonazepam; Lisinopril; Sulfonamide derivatives; and Vicodin  Home Medications   Current Outpatient Rx  Name  Route  Sig  Dispense  Refill  . acetaminophen (TYLENOL) 325 MG tablet   Oral   Take 325 mg by mouth every 6 (six) hours as needed for pain.         [name] aspirin (ASPIRIN CHILDRENS) 81 MG chewable tablet   Oral   Chew 81 mg by mouth daily.          . B Complex-C (B-COMPLEX WITH VITAMIN C) tablet   Oral   Take 1 tablet by mouth daily.         . Calcium Carbonate-Vitamin D (CALCIUM 600+D) 600-400 MG-UNIT per tablet   Oral   Take 1 tablet by mouth 2 (two) times daily.          . clonazePAM (KLONOPIN) 0.5 MG tablet   Oral   Take 0.5 mg by mouth at bedtime.          [name] diltiazem (DILACOR XR) 180 MG 24 hr capsule   Oral   Take 180 mg by mouth daily.         . divalproex (DEPAKOTE) 500 MG EC tablet  Oral   Take 500 mg by mouth 2 (two) times daily.          . Flaxseed, Linseed, (FLAXSEED OIL PO)   Oral   Take 1 capsule by mouth daily.         . Multiple Vitamin (MULITIVITAMIN WITH MINERALS) TABS   Oral   Take 1 tablet by mouth daily. Centrum for women         . Omega-3 Fatty Acids (FISH OIL) 1000 MG CAPS   Oral   Take 1 capsule by mouth at bedtime.          . polyvinyl alcohol (LIQUIFILM TEARS) 1.4 % ophthalmic solution   Both Eyes   Place 1 drop into both eyes as needed. For dry eyes         . risperiDONE (RISPERDAL) 1 MG tablet   Oral   Take 0.5-1 mg by mouth 2 (two) times daily. 0.5mg  in the morning 1mg  at evening.         . vitamin E 400 UNIT capsule   Oral   Take 400 Units by mouth at bedtime.            BP 124/62  Pulse 70  Temp(Src) 98.9 F (37.2 C) (Oral)  Resp 16  SpO2 100%  Physical Exam  Constitutional: She appears well-developed and well-nourished.  HENT:  Head:  Normocephalic.  Neck: Normal range of motion. Neck supple.  Cardiovascular: Normal rate and regular rhythm.   Pulmonary/Chest: Effort normal and breath sounds normal.  Abdominal: Soft. Bowel sounds are normal. There is no tenderness. There is no rebound and no guarding.  Musculoskeletal: Normal range of motion.  Mild bilateral LE edema.  Neurological: She is alert. No cranial nerve deficit.  Skin: Skin is warm and dry. No rash noted.  Psychiatric: She has a normal mood and affect.    ED Course  Procedures (including critical care time)  Labs Reviewed  CBC WITH DIFFERENTIAL - Abnormal; Notable for the following:    RBC 3.80 (*)    Hemoglobin 10.9 (*)    HCT 31.5 (*)    RDW 15.9 (*)    Platelets 119 (*)    Lymphocytes Relative 47 (*)    All other components within normal limits  COMPREHENSIVE METABOLIC PANEL - Abnormal; Notable for the following:    Glucose, Bld 100 (*)    Albumin 3.2 (*)    Total Bilirubin 0.1 (*)    GFR calc non Af Amer 53 (*)    GFR calc Af Amer 61 (*)    All other components within normal limits  TROPONIN I  TROPONIN I   Results for orders placed during the hospital encounter of 07/29/12  CBC WITH DIFFERENTIAL      Result Value Range   WBC 4.9  4.0 - 10.5 K/uL   RBC 3.80 (*) 3.87 - 5.11 MIL/uL   Hemoglobin 10.9 (*) 12.0 - 15.0 g/dL   HCT 16.1 (*) 09.6 - 04.5 %   MCV 82.9  78.0 - 100.0 fL   MCH 28.7  26.0 - 34.0 pg   MCHC 34.6  30.0 - 36.0 g/dL   RDW 40.9 (*) 81.1 - 91.4 %   Platelets 119 (*) 150 - 400 K/uL   Neutrophils Relative 45  43 - 77 %   Neutro Abs 2.2  1.7 - 7.7 K/uL   Lymphocytes Relative 47 (*) 12 - 46 %   Lymphs Abs 2.3  0.7 - 4.0 K/uL   Monocytes  Relative 8  3 - 12 %   Monocytes Absolute 0.4  0.1 - 1.0 K/uL   Eosinophils Relative 0  0 - 5 %   Eosinophils Absolute 0.0  0.0 - 0.7 K/uL   Basophils Relative 0  0 - 1 %   Basophils Absolute 0.0  0.0 - 0.1 K/uL  COMPREHENSIVE METABOLIC PANEL      Result Value Range   Sodium 142  135 -  145 mEq/L   Potassium 4.1  3.5 - 5.1 mEq/L   Chloride 105  96 - 112 mEq/L   CO2 28  19 - 32 mEq/L   Glucose, Bld 100 (*) 70 - 99 mg/dL   BUN 14  6 - 23 mg/dL   Creatinine, Ser 1.61  0.50 - 1.10 mg/dL   Calcium 09.6  8.4 - 04.5 mg/dL   Total Protein 6.6  6.0 - 8.3 g/dL   Albumin 3.2 (*) 3.5 - 5.2 g/dL   AST 20  0 - 37 U/L   ALT 14  0 - 35 U/L   Alkaline Phosphatase 49  39 - 117 U/L   Total Bilirubin 0.1 (*) 0.3 - 1.2 mg/dL   GFR calc non Af Amer 53 (*) >90 mL/min   GFR calc Af Amer 61 (*) >90 mL/min  TROPONIN I      Result Value Range   Troponin I <0.30  <0.30 ng/mL  TROPONIN I      Result Value Range   Troponin I <0.30  <0.30 ng/mL    Dg Chest Portable 1 View  07/29/2012  *RADIOLOGY REPORT*  Clinical Data: Chest pain.  History of hypertension and anemia.  PORTABLE CHEST - 1 VIEW  Comparison: 05/22/2012 and 01/09/2012.  Findings: 0713 hours. The heart size and mediastinal contours are normal. The lungs are clear. There is no pleural effusion or pneumothorax. No acute osseous findings are identified.  Telemetry leads overlie the chest.  IMPRESSION: No active cardiopulmonary process.   Original Report Authenticated By: Carey Bullocks, M.D.      No diagnosis found. 1. Chest pain   MDM  8:00 a.m.: She remains pain free here. Resting with labs pending. Discussed care plan of troponin x 2 with patient and all questions answered.   10:15:  Troponin x 2 negative. Records reviewed; cardiac cath in 2005 completely normal; stress test done 05/2011 normal with no wall motion abnormalities. EKG unchanged from previous EKG with nonspecific t-wave inversion in lateral leads. Patient care discussed with Dr. Lynelle Doctor and is stable for discharge.         Arnoldo Hooker, PA-C 07/29/12 1038

## 2012-07-29 NOTE — ED Notes (Signed)
EMS adm 1 NTG and the pain went away

## 2012-07-29 NOTE — ED Notes (Signed)
Patient states she woke up  At 2am with 10/10 which lasted about 1 hour  Sat for the hour then went back to bed.  About 5am she woke up with chest pain again 6/10 Stated it just stayed in the left chest  Dull throbbing pain.  Denies SOB, sweating, nausea.  Patient took 4 of her baby asp on the way to the ED

## 2012-07-30 NOTE — ED Provider Notes (Signed)
Medical screening examination/treatment/procedure(s) were performed by non-physician practitioner and as supervising physician I was immediately available for consultation/collaboration.    Celene Kras, MD 07/30/12 [phone]

## 2012-08-05 DIAGNOSIS — I10 Essential (primary) hypertension: Secondary | ICD-10-CM | POA: Diagnosis not present

## 2012-08-05 DIAGNOSIS — R079 Chest pain, unspecified: Secondary | ICD-10-CM | POA: Diagnosis not present

## 2012-08-12 DIAGNOSIS — F319 Bipolar disorder, unspecified: Secondary | ICD-10-CM | POA: Diagnosis not present

## 2012-08-19 DIAGNOSIS — R079 Chest pain, unspecified: Secondary | ICD-10-CM | POA: Diagnosis not present

## 2012-08-19 DIAGNOSIS — I10 Essential (primary) hypertension: Secondary | ICD-10-CM | POA: Diagnosis not present

## 2012-09-28 DIAGNOSIS — B351 Tinea unguium: Secondary | ICD-10-CM | POA: Diagnosis not present

## 2012-09-28 DIAGNOSIS — M79609 Pain in unspecified limb: Secondary | ICD-10-CM | POA: Diagnosis not present

## 2012-09-28 DIAGNOSIS — F319 Bipolar disorder, unspecified: Secondary | ICD-10-CM | POA: Diagnosis not present

## 2012-11-02 ENCOUNTER — Other Ambulatory Visit: Payer: Self-pay | Admitting: Family Medicine

## 2012-11-05 DIAGNOSIS — M2020 Hallux rigidus, unspecified foot: Secondary | ICD-10-CM | POA: Diagnosis not present

## 2012-11-05 DIAGNOSIS — M2010 Hallux valgus (acquired), unspecified foot: Secondary | ICD-10-CM | POA: Diagnosis not present

## 2012-11-11 ENCOUNTER — Encounter: Payer: Self-pay | Admitting: Family Medicine

## 2012-11-11 ENCOUNTER — Ambulatory Visit (INDEPENDENT_AMBULATORY_CARE_PROVIDER_SITE_OTHER): Payer: Medicare Other | Admitting: Family Medicine

## 2012-11-11 VITALS — BP 167/90 | HR 74 | Temp 98.2°F | Ht 63.0 in | Wt 156.0 lb

## 2012-11-11 DIAGNOSIS — R1030 Lower abdominal pain, unspecified: Secondary | ICD-10-CM

## 2012-11-11 DIAGNOSIS — K5900 Constipation, unspecified: Secondary | ICD-10-CM

## 2012-11-11 DIAGNOSIS — R109 Unspecified abdominal pain: Secondary | ICD-10-CM | POA: Diagnosis not present

## 2012-11-11 DIAGNOSIS — I10 Essential (primary) hypertension: Secondary | ICD-10-CM

## 2012-11-11 LAB — POCT URINALYSIS DIPSTICK
Blood, UA: NEGATIVE
Glucose, UA: NEGATIVE
Ketones, UA: NEGATIVE
Spec Grav, UA: 1.01

## 2012-11-11 LAB — POCT UA - MICROSCOPIC ONLY

## 2012-11-11 MED ORDER — POLYETHYLENE GLYCOL 3350 17 GM/SCOOP PO POWD: 17.0000 g | Freq: Every day | ORAL | Status: DC | PRN

## 2012-11-11 NOTE — Assessment & Plan Note (Addendum)
-   Mild RLQ and suprapubic pain. May be wither from colon irritation/constipation from colon cleanse she was using. Possibly UTI considering symptoms.  - Obtained a UA today and will call patient with results.  - Miralax prescription for constipation. Advised patient not to use colon cleanse she has been using. - Unable to find colonoscopy report from 2005. Encouraged her to call her GI doctor or we can give her a referral if needed for continued preventive measures. - F/U: Pending results of UA; or if symptoms do not resolve within a week.

## 2012-11-11 NOTE — Patient Instructions (Addendum)
Constipation, Adult Constipation is when a person has fewer than 3 bowel movements a week; has difficulty having a bowel movement; or has stools that are dry, hard, or larger than normal. As people grow older, constipation is more common. If you try to fix constipation with medicines that make you have a bowel movement (laxatives), the problem may get worse. Long-term laxative use may cause the muscles of the colon to become weak. A low-fiber diet, not taking in enough fluids, and taking certain medicines may make constipation worse. CAUSES   Certain medicines, such as antidepressants, pain medicine, iron supplements, antacids, and water pills.   Certain diseases, such as diabetes, irritable bowel syndrome (IBS), thyroid disease, or depression.   Not drinking enough water.   Not eating enough fiber-rich foods.   Stress or travel.  Lack of physical activity or exercise.  Not going to the restroom when there is the urge to have a bowel movement.  Ignoring the urge to have a bowel movement.  Using laxatives too much. SYMPTOMS   Having fewer than 3 bowel movements a week.   Straining to have a bowel movement.   Having hard, dry, or larger than normal stools.   Feeling full or bloated.   Pain in the lower abdomen.  Not feeling relief after having a bowel movement. DIAGNOSIS  Your caregiver will take a medical history and perform a physical exam. Further testing may be done for severe constipation. Some tests may include:   A barium enema X-ray to examine your rectum, colon, and sometimes, your small intestine.  A sigmoidoscopy to examine your lower colon.  A colonoscopy to examine your entire colon. TREATMENT  Treatment will depend on the severity of your constipation and what is causing it. Some dietary treatments include drinking more fluids and eating more fiber-rich foods. Lifestyle treatments may include regular exercise. If these diet and lifestyle  recommendations do not help, your caregiver may recommend taking over-the-counter laxative medicines to help you have bowel movements. Prescription medicines may be prescribed if over-the-counter medicines do not work.  HOME CARE INSTRUCTIONS   Increase dietary fiber in your diet, such as fruits, vegetables, whole grains, and beans. Limit high-fat and processed sugars in your diet, such as [place] fries, hamburgers, cookies, candies, and soda.   A fiber supplement may be added to your diet if you cannot get enough fiber from foods.   Drink enough fluids to keep your urine clear or pale yellow.   Exercise regularly or as directed by your caregiver.   Go to the restroom when you have the urge to go. Do not hold it.  Only take medicines as directed by your caregiver. Do not take other medicines for constipation without talking to your caregiver first. SEEK IMMEDIATE MEDICAL CARE IF:   You have bright red blood in your stool.   Your constipation lasts for more than 4 days or gets worse.   You have abdominal or rectal pain.   You have thin, pencil-like stools.  You have unexplained weight loss. MAKE SURE YOU:   Understand these instructions.  Will watch your condition.  Will get help right away if you are not doing well or get worse. Document Released: 12/29/2003 Document Revised: 06/24/2011 Document Reviewed: 03/05/2011 [facility] Patient Information 2014 [place], [place].  Urinary Tract Infection Urinary tract infections (UTIs) can develop anywhere along your urinary tract. Your urinary tract is your body's drainage system for removing wastes and extra water. Your urinary tract includes two kidneys, two  ureters, a bladder, and a urethra. Your kidneys are a pair of bean-shaped organs. Each kidney is about the size of your fist. They are located below your ribs, one on each side of your spine. CAUSES Infections are caused by microbes, which are microscopic organisms, including  fungi, viruses, and bacteria. These organisms are so small that they can only be seen through a microscope. Bacteria are the microbes that most commonly cause UTIs. SYMPTOMS  Symptoms of UTIs may vary by age and gender of the patient and by the location of the infection. Symptoms in young women typically include a frequent and intense urge to urinate and a painful, burning feeling in the bladder or urethra during urination. Older women and men are more likely to be tired, shaky, and weak and have muscle aches and abdominal pain. A fever may mean the infection is in your kidneys. Other symptoms of a kidney infection include pain in your back or sides below the ribs, nausea, and vomiting. DIAGNOSIS To diagnose a UTI, your caregiver will ask you about your symptoms. Your caregiver also will ask to provide a urine sample. The urine sample will be tested for bacteria and white blood cells. White blood cells are made by your body to help fight infection. TREATMENT  Typically, UTIs can be treated with medication. Because most UTIs are caused by a bacterial infection, they usually can be treated with the use of antibiotics. The choice of antibiotic and length of treatment depend on your symptoms and the type of bacteria causing your infection. HOME CARE INSTRUCTIONS  If you were prescribed antibiotics, take them exactly as your caregiver instructs you. Finish the medication even if you feel better after you have only taken some of the medication.  Drink enough water and fluids to keep your urine clear or pale yellow.  Avoid caffeine, tea, and carbonated beverages. They tend to irritate your bladder.  Empty your bladder often. Avoid holding urine for long periods of time.  Empty your bladder before and after sexual intercourse.  After a bowel movement, women should cleanse from front to back. Use each tissue only once. SEEK MEDICAL CARE IF:   You have back pain.  You develop a fever.  Your symptoms  do not begin to resolve within 3 days. SEEK IMMEDIATE MEDICAL CARE IF:   You have severe back pain or lower abdominal pain.  You develop chills.  You have nausea or vomiting.  You have continued burning or discomfort with urination. MAKE SURE YOU:   Understand these instructions.  Will watch your condition.  Will get help right away if you are not doing well or get worse. Document Released: 01/09/2005 Document Revised: 10/01/2011 Document Reviewed: 05/10/2011 [facility] Patient Information 2014 [place], [place].   I will call you tomorrow with your results of your urine test. Please stop the herbal cleanse and start using Miralax for your constipation

## 2012-11-11 NOTE — Assessment & Plan Note (Signed)
-   pt with elevated BP today in office. Advised her to f/u with PCP concerning BP management.  BP 167/90  Pulse 74  Temp(Src) 98.2 F (36.8 C) (Oral)  Ht 5\' 3"  (1.6 m)  Wt 156 lb (70.761 kg)  BMI 27.64 kg/m2

## 2012-11-11 NOTE — Progress Notes (Signed)
Subjective:     Patient ID: [name], female   DOB: [birth year], 72 y.o.   MRN: [id number]  HPI Abdominal Pain: Patient reports that appears (by her pointing) to be suprapubic to right side of lower abdomen. She reports this pain started 3 days ago, is a "dull constant sore feeling." She is feeling bloated in her stomach. She rates her pain a 5/10. She has trouble with frequent constipation and had been using "herbal colon cleanse" last week. She has been using this frequently, sometimes daily. She does not feel she has had any changes in the color of her stool and denies hematochezia or melania. Her BMs this week have normalized. Her last colonoscopy was in 2005 and she was suppose to repeat her colonoscopy in 5 years, but never returned. She was started in [facility] (for a bunion pain by Dr. Stacie Acres)  on the 24th and was wondering if this could be causing her pain. She endorses problems with urination, frequency, dribble, unable to empty completely and pressure. She reports her urine has been darker and has an odor. She denies fever, diarrhea, bloody stools, or burning with urination. She has chronic  Incontinence issues and wears depends daily. Her appetite is normal, if not increased recently.    Review of Systems Negative, with the exception of above mentioned in HPI     Objective:   Physical Exam BP 167/90  Pulse 74  Temp(Src) 98.2 F (36.8 C) (Oral)  Ht 5\' 3"  (1.6 m)  Wt 156 lb (70.761 kg)  BMI 27.64 kg/m2 Gen: NAD. Pleasant female. Afebrile. Dressed very nicely. Walks with walker.  CV: RRR  Chest: CTAB, no wheeze or crackles Abd: Soft. Not TTP, but after exam stated she was having small sharp pains in her RLQ. BS normal. No  Masses palpated.

## 2012-11-12 ENCOUNTER — Telehealth: Payer: Self-pay | Admitting: Family Medicine

## 2012-11-12 ENCOUNTER — Other Ambulatory Visit: Payer: Self-pay | Admitting: Family Medicine

## 2012-11-12 DIAGNOSIS — N3941 Urge incontinence: Secondary | ICD-10-CM

## 2012-11-12 DIAGNOSIS — R300 Dysuria: Secondary | ICD-10-CM

## 2012-11-12 DIAGNOSIS — R109 Unspecified abdominal pain: Secondary | ICD-10-CM

## 2012-11-12 NOTE — Telephone Encounter (Signed)
Please advise and I will be happy to call with results. Wyatt Haste, RN-BSN

## 2012-11-12 NOTE — Telephone Encounter (Signed)
Pt called back again to get the results of her lab results. I told her that we waiting for the doctor to lets [place] know what they are and she want medications because she is hurting. JW

## 2012-11-12 NOTE — Telephone Encounter (Signed)
I have not met this pt yet. Please forward this notes to Dr. Claiborne Billings who saw her. I will be glad to follow up

## 2012-11-12 NOTE — Telephone Encounter (Signed)
Attempted to return call to pt - no answer Elizabeth Kenneisha Cochrane, RN-BSN  

## 2012-11-12 NOTE — Telephone Encounter (Signed)
Patient calls wanting her lab results from yesterday visit w/ Dr. Claiborne Billings.

## 2012-11-13 MED ORDER — CEPHALEXIN 500 MG PO CAPS: 500.0000 mg | ORAL_CAPSULE | Freq: Three times a day (TID) | ORAL | Status: DC

## 2012-11-13 NOTE — Telephone Encounter (Signed)
Please see notes below and advise - I will be happy to follow up with pt. Wyatt Haste, RN-BSN

## 2012-11-13 NOTE — Telephone Encounter (Signed)
Message left for pt stating that antibiotic has been called in for uti - if pain is worse than she will require an xray - please call if n/v/fever or change in bowel movements - call clinic if any questions. Wyatt Haste, RN-BSN

## 2012-11-13 NOTE — Telephone Encounter (Signed)
I have reviewed her labs, I was waiting on a urine culture to return, but it does not appear it was sent. I have called in an antibiotic to cover her for a UTI, since she did have bacteria in her urine (but would have rather had a culture) . I would like her to get a xray of her abdomen if her pain is worse. She is to follow up with her PCP if her pain becomes worse or she develops a fever, nausea, vomit or changes in bowel movements. Thanks!

## 2012-11-21 ENCOUNTER — Emergency Department (HOSPITAL_COMMUNITY): Payer: Medicare Other

## 2012-11-21 ENCOUNTER — Encounter (HOSPITAL_COMMUNITY): Payer: Self-pay | Admitting: Physical Medicine and Rehabilitation

## 2012-11-21 ENCOUNTER — Emergency Department (HOSPITAL_COMMUNITY)
Admission: EM | Admit: 2012-11-21 | Discharge: 2012-11-21 | Disposition: A | Discharge status: Home / Self Care | Payer: Medicare Other | Attending: Emergency Medicine | Admitting: Emergency Medicine

## 2012-11-21 DIAGNOSIS — N189 Chronic kidney disease, unspecified: Secondary | ICD-10-CM | POA: Insufficient documentation

## 2012-11-21 DIAGNOSIS — Z862 Personal history of diseases of the blood and blood-forming organs and certain disorders involving the immune mechanism: Secondary | ICD-10-CM | POA: Diagnosis not present

## 2012-11-21 DIAGNOSIS — F319 Bipolar disorder, unspecified: Secondary | ICD-10-CM | POA: Insufficient documentation

## 2012-11-21 DIAGNOSIS — I129 Hypertensive chronic kidney disease with stage 1 through stage 4 chronic kidney disease, or unspecified chronic kidney disease: Secondary | ICD-10-CM | POA: Diagnosis not present

## 2012-11-21 DIAGNOSIS — R0602 Shortness of breath: Secondary | ICD-10-CM | POA: Insufficient documentation

## 2012-11-21 DIAGNOSIS — R0789 Other chest pain: Secondary | ICD-10-CM | POA: Insufficient documentation

## 2012-11-21 DIAGNOSIS — Z8669 Personal history of other diseases of the nervous system and sense organs: Secondary | ICD-10-CM | POA: Diagnosis not present

## 2012-11-21 DIAGNOSIS — Z79899 Other long term (current) drug therapy: Secondary | ICD-10-CM | POA: Diagnosis not present

## 2012-11-21 DIAGNOSIS — Z8659 Personal history of other mental and behavioral disorders: Secondary | ICD-10-CM | POA: Diagnosis not present

## 2012-11-21 DIAGNOSIS — R110 Nausea: Secondary | ICD-10-CM | POA: Insufficient documentation

## 2012-11-21 DIAGNOSIS — R079 Chest pain, unspecified: Secondary | ICD-10-CM

## 2012-11-21 DIAGNOSIS — G40909 Epilepsy, unspecified, not intractable, without status epilepticus: Secondary | ICD-10-CM | POA: Insufficient documentation

## 2012-11-21 DIAGNOSIS — R072 Precordial pain: Secondary | ICD-10-CM | POA: Diagnosis not present

## 2012-11-21 DIAGNOSIS — F259 Schizoaffective disorder, unspecified: Secondary | ICD-10-CM

## 2012-11-21 DIAGNOSIS — R61 Generalized hyperhidrosis: Secondary | ICD-10-CM | POA: Diagnosis not present

## 2012-11-21 DIAGNOSIS — I10 Essential (primary) hypertension: Secondary | ICD-10-CM

## 2012-11-21 LAB — BASIC METABOLIC PANEL
BUN: 17 mg/dL (ref 6–23)
CO2: 27 mEq/L (ref 19–32)
Chloride: 98 mEq/L (ref 96–112)
GFR calc Af Amer: 52 mL/min — ABNORMAL LOW (ref >90)
Glucose, Bld: 87 mg/dL (ref 70–99)
Potassium: 4.4 mEq/L (ref 3.5–5.1)

## 2012-11-21 LAB — POCT I-STAT TROPONIN I: Troponin i, poc: 0.02 ng/mL (ref 0.00–0.08)

## 2012-11-21 LAB — CBC
HCT: 36.2 % (ref 36.0–46.0)
MCV: 82.8 fL (ref 78.0–100.0)

## 2012-11-21 NOTE — ED Notes (Addendum)
Pt presents to department via [facility] from home. Onset this morning @ 04:30, took mylanta and was able to get relief and go back to sleep. States she woke up this morning and CP returned. States pain this morning felt different. Received 324 ASA. She is chest pain free upon arrival to ED. Pt is conscious alert and oriented x4. History of anxiety and bipolar disorder, states she recently had a "mental breakdown" and has been more stressed than usual. CBG 92.

## 2012-11-21 NOTE — ED Notes (Signed)
BLOOD SPECIMENS NEED TO BE REDRAWN; INADEQUATE AMOUNT TO  PERFORM TEST; IRMA, PHLEBOTOMIST AND MEGAN, RN NOTIFIED

## 2012-11-21 NOTE — Consult Note (Addendum)
Admit date: 11/21/2012 Referring Physician  Dr. Elesa Massed Primary Physician Lillia Abed, MD Primary Cardiologist  Dr. Mayford Knife Reason for Consultation  Chest pain  HPI:  72 year old female with schizoaffective disorder, hypertension who underwent cardiac catheterization in 2005 which was normal and stress test in 2013 which was low risk with no evidence of ischemia (breast attenuation artifact noted), here with chest discomfort approximately 12 hours ago. It began at approximately 1 AM with associated shortness of breath diaphoresis, took aspirin and some GI medication and finally it resolved. She came in for further evaluation. There was no change in her EKG which showed nonspecific ST-T wave changes, subtle inversion in lateral precordial leads.  Currently on examining her/speaking with her, she is going through her pocketbook/purse looking for her phone. She is smiling and states that she feels well. She is eager to go home. I reminded her about her stress test last year and she was happy about this. She denies any exertional component to her chest pain.     PMH:   Past Medical History  Diagnosis Date  . Depression   . Gout   . Bipolar 1 disorder   . Schizoaffective disorder   . Chronic kidney disease   . Hypertension   . Seizure disorder   . Thrombocytopenia   . Anemia     PSH:   Past Surgical History  Procedure Laterality Date  . Tubal ligation    . Appendectomy     Allergies:  Ativan; Benztropine mesylate; Clonazepam; Lisinopril; Sulfonamide derivatives; and Vicodin Prior to Admit Meds:   (Not in a hospital admission) Prior to Admission medications   Medication Sig Start Date End Date Taking? Authorizing Provider  aspirin 81 MG chewable tablet Chew 81 mg by mouth daily.   Yes Historical Provider, MD  B Complex-C (B-COMPLEX WITH VITAMIN C) tablet Take 1 tablet by mouth daily.   Yes Historical Provider, MD  Calcium Citrate-Vitamin D (CALCIUM CITRATE + D3 PO) Take 1 tablet by mouth  2 (two) times daily.   Yes Historical Provider, MD  cephALEXin (KEFLEX) 500 MG capsule Take 1 capsule (500 mg total) by mouth 3 (three) times daily. 11/13/12  Yes Renee A Kuneff, DO  clonazePAM (KLONOPIN) 0.5 MG tablet Take 0.5 mg by mouth at bedtime.    Yes De Nurse, MD  diltiazem (DILACOR XR) 180 MG 24 hr capsule Take 180 mg by mouth daily.   Yes Historical Provider, MD  divalproex (DEPAKOTE) 500 MG EC tablet Take 500 mg by mouth 2 (two) times daily.  09/12/10  Yes Macy Mis, MD  Flaxseed, Linseed, (FLAXSEED OIL PO) Take 1 capsule by mouth daily.   Yes Historical Provider, MD  meloxicam (MOBIC) 15 MG tablet Take 15 mg by mouth every morning.   Yes Historical Provider, MD  Multiple Vitamins-Minerals (CENTRUM SILVER ADULT 50+ PO) Take 1 tablet by mouth daily.   Yes Historical Provider, MD  Omega-3 Fatty Acids (FISH OIL) 1000 MG CAPS Take 1 capsule by mouth at bedtime.    Yes Historical Provider, MD  omeprazole (PRILOSEC) 20 MG capsule Take 20 mg by mouth daily.   Yes Historical Provider, MD  polyethylene glycol powder (GLYCOLAX/MIRALAX) powder Take 17 g by mouth daily as needed. 11/11/12  Yes Renee A Kuneff, DO  polyvinyl alcohol (LIQUIFILM TEARS) 1.4 % ophthalmic solution Place 1 drop into both eyes as needed. For dry eyes   Yes Historical Provider, MD  vitamin E 400 UNIT capsule Take 400 Units by mouth at bedtime.  Yes Historical Provider, MD   Fam HX:   No family history on file. Social HX:    History   Social History  . Marital Status: Divorced    Spouse Name: N/A    Number of Children: N/A  . Years of Education: N/A   Occupational History  . 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 Smoking status: Never Smoker   . Smokeless tobacco: Never Used  . Alcohol Use: No  . Drug Use: No  . Sexually Active: Not Currently   Other Topics Concern  . Not on file   Social History Narrative   Health Care POA: Maia Petties, daughter   Emergency Contact: Carlus Pavlov [phone]   End of Life Plan: DNR   Live by her self.   Any pets: none   Diet: Patient has a varied diet of protein, starch, and vegetables.   Exercise: Patient walks and uses stationary bike.   Hobbies: Reading the Bible      Lives in [place], uses medicaid transportation to get to appointments.   Is a Optician, dispensing at [employer].                    ROS:  Denies any orthopnea, syncope, palpitations, PND, fever, chills, dysphasia. All 11 ROS were addressed and are negative except what is stated in the HPI  Physical Exam: Blood pressure 138/62, pulse 61, temperature 98.2 F (36.8 C), temperature source Oral, resp. rate 18, SpO2 100.00%.    General: Well developed, well nourished, in no acute distress Head: Eyes PERRLA, No xanthomas.   Normal cephalic and atramatic  Lungs:   Clear bilaterally to auscultation and percussion. Normal respiratory effort. No wheezes, no rales. Heart:   HRRR S1 S2 Pulses are 2+ & equal. No murmurs rubs or gallops            No carotid bruit. No JVD.  No abdominal bruits.  Abdomen: Bowel sounds are positive, abdomen soft and non-tender without masses. No hepatosplenomegaly. Msk:  Back normal. Normal strength and tone for age. Extremities:   No clubbing, cyanosis or edema.  DP +1 Neuro: Alert and oriented X 3, non-focal, MAE x 4 GU: Deferred Rectal: Deferred Psych:  Good affect, responds appropriately    Labs:   Lab Results  Component Value Date   WBC 4.1 11/21/2012   HGB 12.2 11/21/2012   HCT 36.2 11/21/2012   MCV 82.8 11/21/2012   PLT 133* 11/21/2012    Recent Labs Lab 11/21/12 1124  NA 134*  K 4.4  CL 98  CO2 27  BUN 17  CREATININE 1.18*  CALCIUM 10.5  GLUCOSE 87   Trop normal x 2.    Radiology:  Dg Chest 2 View  11/21/2012   *RADIOLOGY REPORT*  Clinical Data: Chest pain, shortness of breath.  CHEST - 2 VIEW  Comparison: 07/29/2012  Findings: Tortuous thoracic aorta. Lungs clear.  Heart size and pulmonary vascularity normal.  No  effusion.  Visualized bones unremarkable.  IMPRESSION: No acute disease   Original Report Authenticated By: D. Andria Rhein, MD   Personally viewed.  EKG: Sinus rhythm rate 72 with nonspecific ST-T wave changes in the lateral precordial leads, unchanged from prior EKG  Personally viewed.   Nuclear stress test: 2/13-low risk, no ischemia (breast attenuation noted) She also had a nuclear stress test in 2010 - no ischemia  Cardiac catheterization 2005 - no significant CAD  ASSESSMENT/PLAN:    73 year old with atypical chest discomfort, schizoaffective disorder with fairly recent nuclear stress test that was low risk, no ischemia.  1. Atypical chest pain  - With 2 sets of cardiac markers/troponin normal and her chest discomfort over 12 hours old, no EKG changes from prior/dynamic changes that is, I'm comfortable with her being discharge from the emergency division with close followup. Dr. Mayford Knife will decide whether or not to proceed with further stress testing. It would not be unreasonable to continue current medical management given her prior evaluations. I spoke to patient about this plan and she is very comfortable with this. She understands that if her symptoms worsen or become more worrisome further cardiac testing may be warranted. She's currently very comfortable. Other possible etiologies for her pain may include gastrointestinal. Continue with proton pump inhibitor. Aspirin. Diltiazem.  2. Abnormal EKG-no change from prior. Reassuring.  3. Schizoaffective disorder-stable.   Donato Schultz, MD  11/21/2012  4:05 PM

## 2012-11-21 NOTE — ED Notes (Signed)
Lab to re-collect I-STAT Troponin.

## 2012-11-21 NOTE — ED Provider Notes (Signed)
TIME SEEN: 11:02 AM  CHIEF COMPLAINT: Chest pain, shortness of breath  HPI: Patient is a 72 y.o. African American female with a history of hypertension, depression, schizoaffective disorder, chronic kidney disease, bipolar disorder who presents the emergency department with complaints of substernal chest pressure that woke her from sleep at 1 AM. She states that she also had shortness of breath, diaphoresis and nausea with this pain. There is no radiation. She has had similar symptoms in the past which she attributes to angina. She reports that she took 4 aspirin and a liquid medicine that her cardiologist has given her for gastric upset. She states this medication relieved her pain within 30 minutes. She denies any prior history of PE or DVT. No lower, swelling or pain. No tearing pain or pain radiating into her back. She denies any history of coronary artery stents. Her last cardiac catheterization was in 1995. She is unclear what her last stress test was. Her cardiologist is Dr. Mayford Knife with [facility]. She is currently completely asymptomatic and has been that way since 2 AM.  ROS: See HPI Constitutional: no fever  Eyes: no drainage  ENT: no runny nose   Cardiovascular:  chest pain  Resp:  SOB  GI: no vomiting GU: no dysuria Integumentary: no rash  Allergy: no hives  Musculoskeletal: no leg swelling  Neurological: no slurred speech ROS otherwise negative  PAST MEDICAL HISTORY/PAST SURGICAL HISTORY:  Past Medical History  Diagnosis Date  . Depression   . Gout   . Bipolar 1 disorder   . Schizoaffective disorder   . Chronic kidney disease   . Hypertension   . Seizure disorder   . Thrombocytopenia   . Anemia     MEDICATIONS:  Prior to Admission medications   Medication Sig Start Date End Date Taking? Authorizing Provider  B Complex-C (B-COMPLEX WITH VITAMIN C) tablet Take 1 tablet by mouth daily.    Historical Provider, MD  cephALEXin (KEFLEX) 500 MG capsule Take 1 capsule  (500 mg total) by mouth 3 (three) times daily. 11/13/12   Renee A Kuneff, DO  clonazePAM (KLONOPIN) 0.5 MG tablet Take 0.5 mg by mouth at bedtime.     De Nurse, MD  diltiazem (DILACOR XR) 180 MG 24 hr capsule Take 180 mg by mouth daily.    Historical Provider, MD  divalproex (DEPAKOTE) 500 MG EC tablet Take 500 mg by mouth 2 (two) times daily.  09/12/10   Macy Mis, MD  Flaxseed, Linseed, (FLAXSEED OIL PO) Take 1 capsule by mouth daily.    Historical Provider, MD  Omega-3 Fatty Acids (FISH OIL) 1000 MG CAPS Take 1 capsule by mouth at bedtime.     Historical Provider, MD  polyethylene glycol powder (GLYCOLAX/MIRALAX) powder Take 17 g by mouth daily as needed. 11/11/12   Renee A Kuneff, DO  polyvinyl alcohol (LIQUIFILM TEARS) 1.4 % ophthalmic solution Place 1 drop into both eyes as needed. For dry eyes    Historical Provider, MD  vitamin E 400 UNIT capsule Take 400 Units by mouth at bedtime.     Historical Provider, MD    ALLERGIES:  Allergies  Allergen Reactions  . Ativan (Lorazepam) Other (See Comments)    Increases fall risk and was told not to take it.  . Benztropine Mesylate Other (See Comments)    Restless legs  . Clonazepam Other (See Comments)    Increases fall risk and was told not to take it.  . Lisinopril Other (See Comments)  cough  . Sulfonamide Derivatives Other (See Comments)    Pt does not remember an allergic reaction to sulfa drugs  . Vicodin (Hydrocodone-Acetaminophen) Other (See Comments)    Increases fall risk and was told not to take it.    SOCIAL HISTORY:  History  Substance Use Topics  . Smoking status: Never Smoker   . Smokeless tobacco: Never Used  . Alcohol Use: No    FAMILY HISTORY: No family history on file.  EXAM: BP 153/63  Pulse 74  Temp(Src) 98.2 F (36.8 C) (Oral)  Resp 16  SpO2 99% CONSTITUTIONAL: Alert and oriented and responds appropriately to questions. Well-appearing; well-nourished HEAD: Normocephalic EYES: Conjunctivae  clear, PERRL ENT: normal nose; no rhinorrhea; moist mucous membranes; pharynx without lesions noted NECK: Supple, no meningismus, no LAD  CARD: RRR; S1 and S2 appreciated; no murmurs, no clicks, no rubs, no gallops RESP: Normal chest excursion without splinting or tachypnea; breath sounds clear and equal bilaterally; no wheezes, no rhonchi, no rales,  ABD/GI: Normal bowel sounds; non-distended; soft, non-tender, no rebound, no guarding BACK:  The back appears normal and is non-tender to palpation, there is no CVA tenderness EXT: Normal ROM in all joints; non-tender to palpation; no edema; normal capillary refill; no cyanosis    SKIN: Normal color for age and race; warm NEURO: Moves all extremities equally PSYCH: The patient's mood and manner are appropriate. Grooming and personal hygiene are appropriate.  MEDICAL DECISION MAKING: Patient with atypical story for cardiac disease. She does have however some risk factors for ACS and she states she is unsure when her last stress test was. We'll obtain cardiac labs, chest x-ray. Her EKG is unchanged from prior -- old ischemic changes. May need admission for ACS rule out.  ED PROGRESS:    Date: 11/21/2012 10:56 AM  Rate: 68  Rhythm: normal sinus rhythm  QRS Axis: normal, LVH  Intervals: normal  ST/T Wave abnormalities: normal  Conduction Disutrbances: Poor R-wave progression, T-wave inversions in inferior and lateral leads which are unchanged compared to prior  Narrative Interpretation: Unchanged ischemic changes in inferior and lateral leads, LVH   2:44 PM  D/w Dr. Anne Fu with cardiology.  He will come to evaluate the patient in the emergency department. Given her symptoms were over 12 hours ago and her first set of cardiac enzymes are negative, she may be able to be discharged home with outpatient followup. We'll get a second troponin at this time. Patient still chest pain-free and hemodynamically stable.  4:16 PM Dr. Anne Fu with cardiology  has seen him feels patient can be discharged home with followup with Dr. Mayford Knife. She has had a stress test within the last year which was low risk. Patient is comfortable with this plan. Given usual and customary return precautions. Second troponin is negative.  Layla Maw Ward, DO 11/21/12 1621

## 2012-11-21 NOTE — ED Notes (Signed)
Cardiology at bedside.

## 2012-12-01 ENCOUNTER — Encounter: Payer: Self-pay | Admitting: Family Medicine

## 2012-12-01 ENCOUNTER — Ambulatory Visit (INDEPENDENT_AMBULATORY_CARE_PROVIDER_SITE_OTHER): Payer: Medicare Other | Admitting: Family Medicine

## 2012-12-01 VITALS — BP 166/78 | HR 77 | Temp 98.4°F | Ht 63.0 in | Wt 153.6 lb

## 2012-12-01 DIAGNOSIS — R300 Dysuria: Secondary | ICD-10-CM | POA: Diagnosis not present

## 2012-12-01 LAB — POCT WET PREP (WET MOUNT)

## 2012-12-01 LAB — POCT URINALYSIS DIPSTICK
Blood, UA: NEGATIVE
Glucose, UA: NEGATIVE
Nitrite, UA: NEGATIVE
Urobilinogen, UA: 0.2

## 2012-12-01 LAB — POCT UA - MICROSCOPIC ONLY

## 2012-12-01 MED ORDER — FLUCONAZOLE 150 MG PO TABS: 150.0000 mg | ORAL_TABLET | Freq: Once | ORAL | Status: DC

## 2012-12-01 NOTE — Progress Notes (Signed)
Patient ID: NEVAH DALAL    DOB: 1941/02/23, 72 y.o.   MRN: [id number] --- Subjective:  Shatarra is a 72 y.o.female who presents for same day clinic with vaginal burning and itching with urination for 1 week. She denies any vaginal discharge or odor. No increased urinary frequency. No low back pain. Occasional lower abdominal discomfort but not anything new recently. No fevers or chills. She was diagnosed with a UTI on 11/11/12 and prescribed keflex which she finished taking 1 week ago. UTI symptoms she had last month have resolved.    ROS: see HPI Past Medical History: reviewed and updated medications and allergies. Social History: Tobacco: none  Objective: Filed Vitals:   12/01/12 0834  BP: 166/78  Pulse: 77  Temp: 98.4 F (36.9 C)    Physical Examination:   General appearance - alert, well appearing, and in no distress Chest - clear to auscultation, no wheezes, rales or rhonchi, symmetric air entry Heart - normal rate, regular rhythm, normal S1, S2, no murmurs, rubs, clicks or gallops Abdomen - soft, nontender, nondistended, no masses or organomegaly, no CVA tenderness Pelvic - normal appearing external genitalia, no obvious vaginal discharge

## 2012-12-01 NOTE — Patient Instructions (Signed)
I think your symptoms could be caused by a yeast infection. I am sending a prescription for it.  I will call you if anything from the labwork comes back abnormal.

## 2012-12-01 NOTE — Assessment & Plan Note (Signed)
Differential: UTI vs yeast infection. Given description of vaginal itching as well as recent completion of antibiotic course, will empirically treat for yeast with fluconazole x1.  Follow up UA, wet prep and Urine culture for adjustment of therapy.

## 2012-12-02 DIAGNOSIS — I10 Essential (primary) hypertension: Secondary | ICD-10-CM | POA: Diagnosis not present

## 2012-12-02 DIAGNOSIS — R079 Chest pain, unspecified: Secondary | ICD-10-CM | POA: Diagnosis not present

## 2012-12-02 DIAGNOSIS — R0602 Shortness of breath: Secondary | ICD-10-CM | POA: Diagnosis not present

## 2012-12-02 LAB — URINE CULTURE: Organism ID, Bacteria: NO GROWTH

## 2012-12-03 ENCOUNTER — Encounter: Payer: Self-pay | Admitting: Family Medicine

## 2012-12-08 DIAGNOSIS — I10 Essential (primary) hypertension: Secondary | ICD-10-CM | POA: Diagnosis not present

## 2012-12-08 DIAGNOSIS — R079 Chest pain, unspecified: Secondary | ICD-10-CM | POA: Diagnosis not present

## 2012-12-09 ENCOUNTER — Telehealth: Payer: Self-pay | Admitting: Family Medicine

## 2012-12-09 NOTE — Telephone Encounter (Signed)
Please advise - shouldn't cardiologist call with results done at their office?I see no record of any test - please advise and I will be happy to call pt. Wyatt Haste, RN-BSN
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
## 2012-12-10 ENCOUNTER — Telehealth: Payer: Self-pay | Admitting: Family Medicine

## 2012-12-10 NOTE — Telephone Encounter (Signed)
Pt called wanting to speak to Dr. Aviva Signs about her fall at the cardiologist office. JW

## 2012-12-11 ENCOUNTER — Encounter: Payer: Self-pay | Admitting: Family Medicine

## 2012-12-11 ENCOUNTER — Ambulatory Visit (INDEPENDENT_AMBULATORY_CARE_PROVIDER_SITE_OTHER): Payer: Medicare Other | Admitting: Family Medicine

## 2012-12-11 VITALS — BP 145/87 | HR 71 | Temp 98.1°F | Wt 151.6 lb

## 2012-12-11 DIAGNOSIS — K121 Other forms of stomatitis: Secondary | ICD-10-CM

## 2012-12-11 DIAGNOSIS — K1370 Unspecified lesions of oral mucosa: Secondary | ICD-10-CM | POA: Diagnosis not present

## 2012-12-11 MED ORDER — MAGIC MOUTHWASH W/LIDOCAINE: 2.0000 mL | Freq: Four times a day (QID) | ORAL | Status: DC | PRN

## 2012-12-11 NOTE — Telephone Encounter (Signed)
Pt needs appointment to discuss and properly document fall.

## 2012-12-11 NOTE — Assessment & Plan Note (Signed)
Ulcer secondary to dentures. Encouraged to keep partials out as much as possible (especially at night) to allow it to heal. Use magic mouthwash prn for symptomatic relief of swelling and pain. F/u with PCP and dentist to discuss having rest of her teeth extracted and/or if her swelling gets worse.

## 2012-12-11 NOTE — Progress Notes (Signed)
Patient ID: [name], female   DOB: 1941/02/27, 72 y.o.   MRN: [id number]  [facility] Amber M. Hairford, MD Phone: [phone]   Subjective: HPI: Patient is a 72 y.o. female presenting to clinic today for same day appointment for mouth sores.  Started with throat drainage last week. Then developed pain on lower right side and under tongue. Also reports swelling under tongue. She states she has cavities on lower teeth and sticks her tongue into the areas and makes it sore. (?) She has a partial in place and sleeps in her dentures, to keep her tongue out. She wants her teeth to be pulled.    History Reviewed: Non smoker. ROS: Please see HPI above.  Objective: Office vital signs reviewed. BP 145/87  Pulse 71  Temp(Src) 98.1 F (36.7 C) (Oral)  Wt 151 lb 9.6 oz (68.765 kg)  BMI 26.86 kg/m2  Physical Examination:  General: Awake, alert. NAD. Wearing sunglasses. HEENT: Atraumatic, normocephalic. MMM. Partial dentures on bottom, full dentures on top. Small ulceration at bottom of partial denture with surrounding erythema. Enlarged vein under tongue that is also mildly erythematous. No obvious abscess around teeth or under tongue.  Neck: No masses palpated. No LAD Pulm: CTAB, no wheezes Cardio: RRR, no murmurs appreciated Neuro: Grossly intact  Assessment: 72 y.o. female with gum ulcer due to dentures  Plan: See Problem List and After Visit Summary

## 2012-12-11 NOTE — Patient Instructions (Addendum)
I am sorry your tongue is bothering you. I think this is all a reaction to your partial.  Please keep it out as much as possible, especially at night. I have given you a mouth wash to use to help with the pain and swelling. Swish it around 4 times per day.  Please call your dentist to be seen about your teeth.  Amber M. Hairford, M.D.

## 2012-12-24 DIAGNOSIS — B351 Tinea unguium: Secondary | ICD-10-CM | POA: Diagnosis not present

## 2012-12-24 DIAGNOSIS — M79609 Pain in unspecified limb: Secondary | ICD-10-CM | POA: Diagnosis not present

## 2012-12-28 DIAGNOSIS — F319 Bipolar disorder, unspecified: Secondary | ICD-10-CM | POA: Diagnosis not present

## 2013-01-03 ENCOUNTER — Emergency Department (HOSPITAL_COMMUNITY): Payer: Medicare Other

## 2013-01-03 ENCOUNTER — Encounter (HOSPITAL_COMMUNITY): Payer: Self-pay | Admitting: *Deleted

## 2013-01-03 ENCOUNTER — Emergency Department (HOSPITAL_COMMUNITY)
Admission: EM | Admit: 2013-01-03 | Discharge: 2013-01-03 | Disposition: A | Discharge status: Home / Self Care | Payer: Medicare Other | Attending: Emergency Medicine | Admitting: Emergency Medicine

## 2013-01-03 DIAGNOSIS — Z7982 Long term (current) use of aspirin: Secondary | ICD-10-CM | POA: Insufficient documentation

## 2013-01-03 DIAGNOSIS — Z791 Long term (current) use of non-steroidal anti-inflammatories (NSAID): Secondary | ICD-10-CM | POA: Insufficient documentation

## 2013-01-03 DIAGNOSIS — G40909 Epilepsy, unspecified, not intractable, without status epilepticus: Secondary | ICD-10-CM | POA: Insufficient documentation

## 2013-01-03 DIAGNOSIS — R0600 Dyspnea, unspecified: Secondary | ICD-10-CM

## 2013-01-03 DIAGNOSIS — I129 Hypertensive chronic kidney disease with stage 1 through stage 4 chronic kidney disease, or unspecified chronic kidney disease: Secondary | ICD-10-CM | POA: Insufficient documentation

## 2013-01-03 DIAGNOSIS — M109 Gout, unspecified: Secondary | ICD-10-CM | POA: Diagnosis not present

## 2013-01-03 DIAGNOSIS — R0609 Other forms of dyspnea: Secondary | ICD-10-CM | POA: Insufficient documentation

## 2013-01-03 DIAGNOSIS — D696 Thrombocytopenia, unspecified: Secondary | ICD-10-CM | POA: Diagnosis not present

## 2013-01-03 DIAGNOSIS — Z79899 Other long term (current) drug therapy: Secondary | ICD-10-CM | POA: Diagnosis not present

## 2013-01-03 DIAGNOSIS — D649 Anemia, unspecified: Secondary | ICD-10-CM | POA: Diagnosis not present

## 2013-01-03 DIAGNOSIS — G4700 Insomnia, unspecified: Secondary | ICD-10-CM | POA: Diagnosis not present

## 2013-01-03 DIAGNOSIS — R0602 Shortness of breath: Secondary | ICD-10-CM | POA: Diagnosis not present

## 2013-01-03 DIAGNOSIS — N189 Chronic kidney disease, unspecified: Secondary | ICD-10-CM | POA: Diagnosis not present

## 2013-01-03 DIAGNOSIS — R569 Unspecified convulsions: Secondary | ICD-10-CM | POA: Diagnosis not present

## 2013-01-03 DIAGNOSIS — R0989 Other specified symptoms and signs involving the circulatory and respiratory systems: Secondary | ICD-10-CM | POA: Insufficient documentation

## 2013-01-03 DIAGNOSIS — F319 Bipolar disorder, unspecified: Secondary | ICD-10-CM | POA: Insufficient documentation

## 2013-01-03 LAB — COMPREHENSIVE METABOLIC PANEL
ALT: 15 U/L (ref 0–35)
BUN: 15 mg/dL (ref 6–23)
Calcium: 10.9 mg/dL — ABNORMAL HIGH (ref 8.4–10.5)
GFR calc Af Amer: 66 mL/min — ABNORMAL LOW (ref >90)
Glucose, Bld: 106 mg/dL — ABNORMAL HIGH (ref 70–99)
Sodium: 136 mEq/L (ref 135–145)
Total Protein: 7.7 g/dL (ref 6.0–8.3)

## 2013-01-03 LAB — CBC
HCT: 36.8 % (ref 36.0–46.0)
Hemoglobin: 12.6 g/dL (ref 12.0–15.0)
WBC: 5.5 10*3/uL (ref 4.0–10.5)

## 2013-01-03 MED ORDER — DIPHENHYDRAMINE HCL 25 MG PO CAPS
25.0000 mg | ORAL_CAPSULE | Freq: Once | ORAL | Status: AC
  Administered 2013-01-03: 25 mg via ORAL
  Filled 2013-01-03: qty 1

## 2013-01-03 NOTE — ED Provider Notes (Signed)
CSN: [id number]     Arrival date & time 01/03/13  0110 History   First MD Initiated Contact with Patient 01/03/13 [phone]     Chief Complaint  Patient presents with  . Seizures    Patient is a 72 y.o. female presenting with seizures. The history is provided by the patient.  Seizures Seizure activity on arrival: no   Return to baseline: yes   Severity:  Moderate Progression:  Resolved History of seizures: yes   pt presents for multiple complaints She reports she thinks she had multiple seizures prior to her arrival in the ER She also reports that she has been unable to sleep and would like assistance with her insomnia While checking into the ER she told the nurse she just became SOB No CP reported No fever/cough reported  For her seizures, she reports med compliance with her depakote She denies any head or neck injury from the seizure   Past Medical History  Diagnosis Date  . Depression   . Gout   . Bipolar 1 disorder   . Schizoaffective disorder   . Chronic kidney disease   . Hypertension   . Seizure disorder   . Thrombocytopenia   . Anemia    Past Surgical History  Procedure Laterality Date  . Tubal ligation    . Appendectomy     No family history on file. History  Substance Use Topics  . Smoking status: Never Smoker   . Smokeless tobacco: Never Used  . Alcohol Use: No   OB History   Grav Para Term Preterm Abortions TAB SAB Ect Mult Living                 Review of Systems  Constitutional: Negative for fever.  Respiratory: Positive for shortness of breath. Negative for cough.   Cardiovascular: Negative for chest pain.  Gastrointestinal: Negative for vomiting.  Neurological: Positive for seizures. Negative for weakness and headaches.  All other systems reviewed and are negative.    Allergies  Ativan; Benztropine mesylate; Clonazepam; Lisinopril; Sulfonamide derivatives; and Vicodin  Home Medications   Current Outpatient Rx  Name  Route  Sig  Dispense   Refill  . Alum & Mag Hydroxide-Simeth (MAGIC MOUTHWASH W/LIDOCAINE) SOLN   Oral   Take 2 mLs by mouth 4 (four) times daily as needed.   100 mL   0   . aspirin 81 MG chewable tablet   Oral   Chew 81 mg by mouth daily.         . B Complex-C (B-COMPLEX WITH VITAMIN C) tablet   Oral   Take 1 tablet by mouth daily.         . Calcium Citrate-Vitamin D (CALCIUM CITRATE + D3 PO)   Oral   Take 1 tablet by mouth 2 (two) times daily.         . clonazePAM (KLONOPIN) 0.5 MG tablet   Oral   Take 0.5 mg by mouth at bedtime.          [name] diltiazem (DILACOR XR) 180 MG 24 hr capsule   Oral   Take 180 mg by mouth daily.         . divalproex (DEPAKOTE) 500 MG EC tablet   Oral   Take 500 mg by mouth 2 (two) times daily.          . Flaxseed, Linseed, (FLAXSEED OIL PO)   Oral   Take 1 capsule by mouth daily.         [name]  fluconazole (DIFLUCAN) 150 MG tablet   Oral   Take 1 tablet (150 mg total) by mouth once.   1 tablet   0   . meloxicam (MOBIC) 15 MG tablet   Oral   Take 15 mg by mouth every morning.         . Multiple Vitamins-Minerals (CENTRUM SILVER ADULT 50+ PO)   Oral   Take 1 tablet by mouth daily.         . Omega-3 Fatty Acids (FISH OIL) 1000 MG CAPS   Oral   Take 1 capsule by mouth at bedtime.          [name] omeprazole (PRILOSEC) 20 MG capsule   Oral   Take 20 mg by mouth daily.         . polyethylene glycol powder (GLYCOLAX/MIRALAX) powder   Oral   Take 17 g by mouth daily as needed.   3350 g   1   . polyvinyl alcohol (LIQUIFILM TEARS) 1.4 % ophthalmic solution   Both Eyes   Place 1 drop into both eyes as needed. For dry eyes         . vitamin E 400 UNIT capsule   Oral   Take 400 Units by mouth at bedtime.           BP 157/86  Pulse 84  Temp(Src) 97.8 F (36.6 C) (Oral)  Resp 18  Ht 5\' 3"  (1.6 m)  Wt 145 lb (65.772 kg)  BMI 25.69 kg/m2  SpO2 100% Physical Exam CONSTITUTIONAL: Well developed/well nourished HEAD:  Normocephalic/atraumatic EYES: EOMI/PERRL ENMT: Mucous membranes moist NECK: supple no meningeal signs SPINE:entire spine nontender CV: S1/S2 noted, no murmurs/rubs/gallops noted LUNGS: Lungs are clear to auscultation bilaterally, no apparent distress ABDOMEN: soft, nontender, no rebound or guarding GU:no cva tenderness NEURO: Pt is awake/alert, moves all extremitiesx4, no arm or leg drift.   EXTREMITIES: pulses normal, full ROM SKIN: warm, color normal PSYCH: poor eye contact during exam  ED Course  Procedures  Labs Review Labs Reviewed  COMPREHENSIVE METABOLIC PANEL  CBC  VALPROIC ACID LEVEL   Pt observed in the ED for several hours She had no seizure here She is in no distress and no signs of trauma from recent seizure She is on depakote and her level appears therapeutic  For her SOB, EKG/CXR negative.  She had no hypoxia or tachypnea on my evaluations She does not provide any further details concerning her SOB.  No further workup indicated  She reports she is most concerned about her insomnia.  She has ride home and I offered her benadryl and she was agreeable with this plan  I told her to call her PCP within 24 hours for re-evaluation and she may need to be evaluated for further AED therapy Given no signs of head injury and h/o seizure I don't feel neuroimaging is necessary  MDM  No diagnosis found. Nursing notes including past medical history and social history reviewed and considered in documentation xrays reviewed and considered Labs/vital reviewed and considered    Date: 01/03/2013  Rate: 93  Rhythm: normal sinus rhythm  QRS Axis: normal  Intervals: normal  ST/T Wave abnormalities: nonspecific ST changes  Conduction Disutrbances:none  Narrative Interpretation: LVH noted  Old EKG Reviewed: unchanged from 07/2012    Joya Gaskins, MD 01/03/13 [phone]

## 2013-01-03 NOTE — ED Notes (Signed)
Patient returned from X-ray 

## 2013-01-03 NOTE — ED Notes (Signed)
Pt states that she has been unable to sleep x 3 days and that today she has had multiple back to back seizures. Pt SOB walking into the emergency room. States that the SOB just started.

## 2013-01-03 NOTE — ED Notes (Signed)
MD at bedside. 

## 2013-01-03 NOTE — ED Notes (Signed)
Pt given something to drink per MD approval. 

## 2013-01-03 NOTE — ED Notes (Addendum)
Pt states she believes she had many seizures tonight, pt states she lives by herself. Pt states she felt the seizures coming on. Pt states she called her brother and he picked her up and took her to the ED. Pt is A&Ox4. Pt states she was told she has a seizure disorder in the past.

## 2013-01-05 DIAGNOSIS — F319 Bipolar disorder, unspecified: Secondary | ICD-10-CM | POA: Diagnosis not present

## 2013-01-11 ENCOUNTER — Emergency Department (HOSPITAL_COMMUNITY): Payer: Medicare Other

## 2013-01-11 ENCOUNTER — Inpatient Hospital Stay (HOSPITAL_COMMUNITY)
Admission: EM | Admit: 2013-01-11 | Discharge: 2013-01-18 | DRG: 101 | Disposition: A | Discharge status: Skilled Nursing Facility | Payer: Medicare Other | Attending: Family Medicine | Admitting: Family Medicine

## 2013-01-11 ENCOUNTER — Encounter (HOSPITAL_COMMUNITY): Payer: Self-pay

## 2013-01-11 DIAGNOSIS — N179 Acute kidney failure, unspecified: Secondary | ICD-10-CM | POA: Diagnosis present

## 2013-01-11 DIAGNOSIS — N289 Disorder of kidney and ureter, unspecified: Secondary | ICD-10-CM | POA: Diagnosis not present

## 2013-01-11 DIAGNOSIS — E860 Dehydration: Secondary | ICD-10-CM | POA: Diagnosis present

## 2013-01-11 DIAGNOSIS — G40909 Epilepsy, unspecified, not intractable, without status epilepticus: Secondary | ICD-10-CM | POA: Diagnosis not present

## 2013-01-11 DIAGNOSIS — Z23 Encounter for immunization: Secondary | ICD-10-CM | POA: Diagnosis not present

## 2013-01-11 DIAGNOSIS — I10 Essential (primary) hypertension: Secondary | ICD-10-CM

## 2013-01-11 DIAGNOSIS — R4182 Altered mental status, unspecified: Secondary | ICD-10-CM

## 2013-01-11 DIAGNOSIS — I129 Hypertensive chronic kidney disease with stage 1 through stage 4 chronic kidney disease, or unspecified chronic kidney disease: Secondary | ICD-10-CM | POA: Diagnosis present

## 2013-01-11 DIAGNOSIS — R569 Unspecified convulsions: Secondary | ICD-10-CM | POA: Diagnosis not present

## 2013-01-11 DIAGNOSIS — R489 Unspecified symbolic dysfunctions: Secondary | ICD-10-CM | POA: Diagnosis not present

## 2013-01-11 DIAGNOSIS — Z9119 Patient's noncompliance with other medical treatment and regimen: Secondary | ICD-10-CM

## 2013-01-11 DIAGNOSIS — F319 Bipolar disorder, unspecified: Secondary | ICD-10-CM | POA: Diagnosis present

## 2013-01-11 DIAGNOSIS — M109 Gout, unspecified: Secondary | ICD-10-CM | POA: Diagnosis present

## 2013-01-11 DIAGNOSIS — Z87440 Personal history of urinary (tract) infections: Secondary | ICD-10-CM | POA: Diagnosis not present

## 2013-01-11 DIAGNOSIS — R7401 Elevation of levels of liver transaminase levels: Secondary | ICD-10-CM

## 2013-01-11 DIAGNOSIS — N183 Chronic kidney disease, stage 3 unspecified: Secondary | ICD-10-CM | POA: Diagnosis not present

## 2013-01-11 DIAGNOSIS — Z91199 Patient's noncompliance with other medical treatment and regimen due to unspecified reason: Secondary | ICD-10-CM

## 2013-01-11 DIAGNOSIS — N390 Urinary tract infection, site not specified: Secondary | ICD-10-CM | POA: Diagnosis not present

## 2013-01-11 DIAGNOSIS — D696 Thrombocytopenia, unspecified: Secondary | ICD-10-CM | POA: Diagnosis not present

## 2013-01-11 DIAGNOSIS — F259 Schizoaffective disorder, unspecified: Secondary | ICD-10-CM | POA: Diagnosis not present

## 2013-01-11 DIAGNOSIS — D649 Anemia, unspecified: Secondary | ICD-10-CM

## 2013-01-11 DIAGNOSIS — A599 Trichomoniasis, unspecified: Secondary | ICD-10-CM | POA: Diagnosis not present

## 2013-01-11 DIAGNOSIS — R404 Transient alteration of awareness: Secondary | ICD-10-CM | POA: Diagnosis not present

## 2013-01-11 DIAGNOSIS — R945 Abnormal results of liver function studies: Secondary | ICD-10-CM | POA: Diagnosis not present

## 2013-01-11 DIAGNOSIS — M6281 Muscle weakness (generalized): Secondary | ICD-10-CM | POA: Diagnosis not present

## 2013-01-11 LAB — URINALYSIS, ROUTINE W REFLEX MICROSCOPIC
Glucose, UA: NEGATIVE mg/dL
Nitrite: NEGATIVE
Specific Gravity, Urine: 1.025 (ref 1.005–1.030)
pH: 5.5 (ref 5.0–8.0)

## 2013-01-11 LAB — CBC
HCT: 38.9 % (ref 36.0–46.0)
Hemoglobin: 13.3 g/dL (ref 12.0–15.0)
MCH: 28.1 pg (ref 26.0–34.0)
MCV: 82.1 fL (ref 78.0–100.0)
Platelets: 176 10*3/uL (ref 150–400)
RBC: 4.74 MIL/uL (ref 3.87–5.11)
RDW: 16.1 % — ABNORMAL HIGH (ref 11.5–15.5)
WBC: 7.8 10*3/uL (ref 4.0–10.5)

## 2013-01-11 LAB — URINE MICROSCOPIC-ADD ON

## 2013-01-11 LAB — COMPREHENSIVE METABOLIC PANEL
ALT: 84 U/L — ABNORMAL HIGH (ref 0–35)
Albumin: 4.1 g/dL (ref 3.5–5.2)
Alkaline Phosphatase: 61 U/L (ref 39–117)
Calcium: 11.4 mg/dL — ABNORMAL HIGH (ref 8.4–10.5)
Chloride: 98 mEq/L (ref 96–112)
Creatinine, Ser: 1.31 mg/dL — ABNORMAL HIGH (ref 0.50–1.10)
GFR calc Af Amer: 46 mL/min — ABNORMAL LOW (ref >90)
GFR calc non Af Amer: 39 mL/min — ABNORMAL LOW (ref >90)
Glucose, Bld: 119 mg/dL — ABNORMAL HIGH (ref 70–99)
Sodium: 136 mEq/L (ref 135–145)

## 2013-01-11 LAB — RAPID URINE DRUG SCREEN, HOSP PERFORMED
Barbiturates: NOT DETECTED
Cocaine: NOT DETECTED
Opiates: NOT DETECTED

## 2013-01-11 LAB — ACETAMINOPHEN LEVEL: Acetaminophen (Tylenol), Serum: <15 ug/mL (ref 10–30)

## 2013-01-11 LAB — POTASSIUM: Potassium: 4.2 mEq/L (ref 3.5–5.1)

## 2013-01-11 LAB — ETHANOL: Alcohol, Ethyl (B): <11 mg/dL (ref 0–11)

## 2013-01-11 MED ORDER — SODIUM CHLORIDE 0.9 % IV BOLUS (SEPSIS)
1000.0000 mL | Freq: Once | INTRAVENOUS | Status: AC
  Administered 2013-01-11: 1000 mL via INTRAVENOUS

## 2013-01-11 MED ORDER — DEXTROSE 5 % IV SOLN
1.0000 g | Freq: Once | INTRAVENOUS | Status: AC
  Administered 2013-01-11: 1 g via INTRAVENOUS
  Filled 2013-01-11: qty 10

## 2013-01-11 MED ORDER — SODIUM POLYSTYRENE SULFONATE 15 GM/60ML PO SUSP: 45.0000 g | Freq: Once | ORAL | Status: DC

## 2013-01-11 NOTE — ED Notes (Signed)
Bedside ultrasound in progress.

## 2013-01-11 NOTE — ED Provider Notes (Signed)
CSN: [id number]     Arrival date & time 01/11/13  1857 History   First MD Initiated Contact with Patient 01/11/13 2009     Chief Complaint  Patient presents with  . Medical Clearance   (Consider location/radiation/quality/duration/timing/severity/associated sxs/prior Treatment) HPI A LEVEL 5 CAVEAT PERTAINS DUE TO ALTERED MENTAL STATUS Pt presents due to concern for a change in her mental status.  Per family she is having changes in her behavior including shouting and screaming and talking about religion.  They state this is similar to times in the past she has stopped taking her medications.   Past Medical History  Diagnosis Date  . Depression   . Gout   . Bipolar 1 disorder   . Schizoaffective disorder   . Chronic kidney disease   . Hypertension   . Seizure disorder   . Thrombocytopenia   . Anemia    Past Surgical History  Procedure Laterality Date  . Tubal ligation    . Appendectomy     History reviewed. No pertinent family history. History  Substance Use Topics  . Smoking status: Never Smoker   . Smokeless tobacco: Never Used  . Alcohol Use: No   OB History   Grav Para Term Preterm Abortions TAB SAB Ect Mult Living                 Review of Systems UNABLE TO OBTAIN ROS DUE TO LEVEL 5 CAVEAT  Allergies  Ativan; Benztropine mesylate; Clonazepam; Vicodin; Lisinopril; and Sulfonamide derivatives  Home Medications   Current Outpatient Rx  Name  Route  Sig  Dispense  Refill  . acetaminophen (TYLENOL) 325 MG tablet   Oral   Take 650 mg by mouth every 6 (six) hours as needed for pain.         . B Complex-C (B-COMPLEX WITH VITAMIN C) tablet   Oral   Take 1 tablet by mouth daily.         . Calcium Citrate-Vitamin D (CALCIUM CITRATE + D3 PO)   Oral   Take 1 tablet by mouth 2 (two) times daily.         . divalproex (DEPAKOTE) 500 MG EC tablet   Oral   Take 500 mg by mouth daily.          . Flaxseed, Linseed, (FLAXSEED OIL PO)   Oral   Take 1  capsule by mouth daily.         . Multiple Vitamins-Minerals (CENTRUM SILVER ADULT 50+ PO)   Oral   Take 1 tablet by mouth daily.         . Omega-3 Fatty Acids (OMEGA-3 FISH OIL PO)   Oral   Take 325 mg by mouth 3 (three) times daily.         . Alum & Mag Hydroxide-Simeth (MAGIC MOUTHWASH W/LIDOCAINE) SOLN   Oral   Take 2 mLs by mouth 4 (four) times daily as needed.   100 mL   0   . diltiazem (DILACOR XR) 180 MG 24 hr capsule   Oral   Take 180 mg by mouth daily.         . Menthol, Topical Analgesic, (ICY HOT) 7.5 % (ROLL) MISC   Apply externally   Apply 1 each topically as needed (for pain).         . polyethylene glycol powder (GLYCOLAX/MIRALAX) powder   Oral   Take 17 g by mouth daily as needed.   3350 g   1   .  polyvinyl alcohol (LIQUIFILM TEARS) 1.4 % ophthalmic solution   Both Eyes   Place 1 drop into both eyes as needed. For dry eyes         . vitamin E 400 UNIT capsule   Oral   Take 400 Units by mouth at bedtime.           BP 159/81  Pulse 122  Temp(Src) 99.6 F (37.6 C) (Oral)  Resp 27  SpO2 99% Vitals reviewed Physical Exam Physical Examination: General appearance - alert, well appearing, and in no distress Mental status - alert, oriented to person, place, and time Eyes - pupils equal and reactive, extraocular eye movements intact Mouth - mucous membranes moist, pharynx normal without lesions Chest - clear to auscultation, no wheezes, rales or rhonchi, symmetric air entry Heart - normal rate, regular rhythm, normal S1, S2, no murmurs, rubs, clicks or gallops Abdomen - soft, nontender, nondistended, no masses or organomegaly, nabs Neurological - alert, oriented, normal speech, no focal findings or movement disorder noted Extremities - peripheral pulses normal, no pedal edema, no clubbing or cyanosis Skin - normal coloration and turgor, no rashes Psych- tangential thoughts, loose associations  ED Course  Procedures (including critical  care time)   Date: 01/11/2013  Rate: 114  Rhythm: sinus tachycardia  QRS Axis: normal  Intervals: QT prolonged  ST/T Wave abnormalities: nonspecific T wave changes  Conduction Disutrbances:none  Narrative Interpretation:   Old EKG Reviewed: since prior EKG rate faster, and QT prolongation is new  12:18 AM d/w [facility] resident, patient accepted to their service at [facility], Dr. Deirdre Priest attending  Labs Review Labs Reviewed  CBC - Abnormal; Notable for the following:    RDW 16.1 (*)    All other components within normal limits  COMPREHENSIVE METABOLIC PANEL - Abnormal; Notable for the following:    Potassium 5.4 (*)    Glucose, Bld 119 (*)    BUN 26 (*)    Creatinine, Ser 1.31 (*)    Calcium 11.4 (*)    AST 304 (*)    ALT 84 (*)    GFR calc non Af Amer 39 (*)    GFR calc Af Amer 46 (*)    All other components within normal limits  SALICYLATE LEVEL - Abnormal; Notable for the following:    Salicylate Lvl <2.0 (*)    All other components within normal limits  URINALYSIS, ROUTINE W REFLEX MICROSCOPIC - Abnormal; Notable for the following:    Color, Urine AMBER (*)    APPearance CLOUDY (*)    Hgb urine dipstick SMALL (*)    Bilirubin Urine SMALL (*)    Protein, ur 100 (*)    Leukocytes, UA MODERATE (*)    All other components within normal limits  URINE MICROSCOPIC-ADD ON - Abnormal; Notable for the following:    Bacteria, UA MANY (*)    Crystals CA OXALATE CRYSTALS (*)    All other components within normal limits  URINE CULTURE  CULTURE, BLOOD (ROUTINE X 2)  CULTURE, BLOOD (ROUTINE X 2)  ACETAMINOPHEN LEVEL  ETHANOL  URINE RAPID DRUG SCREEN (HOSP PERFORMED)  POTASSIUM   Imaging Review US Abdomen Complete  01/11/2013   CLINICAL DATA:  Elevated liver function studies.  EXAM: ULTRASOUND ABDOMEN COMPLETE  COMPARISON:  CT scan 06/08/2011.  FINDINGS: Gallbladder  No gallstones or wall thickening. Negative sonographic Murphy's sign.  Common bile duct  Diameter: 4.3 mm  Liver   Diffuse fatty infiltration of the liver with increased echogenicity and  decreased through transmission. No focal lesions or intrahepatic biliary dilatation.  IVC  Normal caliber.  Pancreas  Sonographically unremarkable.  Spleen  Normal size and echogenicity without focal lesions.  Right Kidney  Length: 8.2 cm in length. Mild renal cortical thinning but no focal lesions or hydronephrosis.  Left Kidney  Length: 10.2 cm in length. Mild renal cortical thinning but no focal lesion or hydronephrosis.  Abdominal aorta  Normal caliber.  IMPRESSION: 1. Diffuse fatty infiltration of the liver. 2. Normal gallbladder and normal caliber common bowel duct. 3. Mild renal cortical thinning bilaterally but no hydronephrosis.   Electronically Signed   By: Loralie Champagne M.D.   On: 01/11/2013 22:13    MDM   1. Altered mental status   2. Urinary tract infection   3. Renal insufficiency   4. Transaminitis    Pt presenting with altered mental status.  Symptoms of hallucination, tangential thoughts- could be related to not taking her psych meds- however patient found to have evidence of UTI- started on rocephin.  Also some renal insufficiency above her baseline.  Initially potassium was 5.4- this was rechecked and 4.2.  LFTs elevated- abdominal ultrasound obtained and c/w diffuse fatty infiltration of liver- no other acute findings.  Pt will need medical admission- will d/w [facility] as this is her primary, then may need psych involvement as well.      Ethelda Chick, MD 01/12/13 [phone]

## 2013-01-11 NOTE — ED Notes (Addendum)
Pt presents having flight of thoughts, screaming and making continued religious comments.  When this RN asked Pt if she has been taking medication, she stated "yes" and then began talking about urine, the "distribution of the 4" and several religious comments.  Pt's family is unsure if Pt quit taking medication, but state "this is how she acts when she is off her medication."  Pt would not allow her home health aid to enter her house today.  Hx of Bi Polar disorder and Depression.    Pt's brother sts this episode started on Friday.

## 2013-01-12 DIAGNOSIS — R4182 Altered mental status, unspecified: Secondary | ICD-10-CM

## 2013-01-12 LAB — CBC
HCT: 38.1 % (ref 36.0–46.0)
Hemoglobin: 13 g/dL (ref 12.0–15.0)
MCH: 28.3 pg (ref 26.0–34.0)
MCV: 82.8 fL (ref 78.0–100.0)
Platelets: 183 10*3/uL (ref 150–400)
RBC: 4.6 MIL/uL (ref 3.87–5.11)
WBC: 6.5 10*3/uL (ref 4.0–10.5)

## 2013-01-12 LAB — POCT I-STAT, CHEM 8
Calcium, Ion: 1.23 mmol/L (ref 1.13–1.30)
Chloride: 109 mEq/L (ref 96–112)
Glucose, Bld: 100 mg/dL — ABNORMAL HIGH (ref 70–99)
Hemoglobin: 12.9 g/dL (ref 12.0–15.0)
Sodium: 142 mEq/L (ref 135–145)
TCO2: 19 mmol/L (ref 0–100)

## 2013-01-12 LAB — PROTIME-INR
INR: 1.01 (ref 0.00–1.49)
Prothrombin Time: 13.1 seconds (ref 11.6–15.2)

## 2013-01-12 LAB — COMPREHENSIVE METABOLIC PANEL
AST: 269 U/L — ABNORMAL HIGH (ref 0–37)
BUN: 23 mg/dL (ref 6–23)
CO2: 23 mEq/L (ref 19–32)
Chloride: 103 mEq/L (ref 96–112)
Creatinine, Ser: 1.1 mg/dL (ref 0.50–1.10)
GFR calc Af Amer: 56 mL/min — ABNORMAL LOW (ref >90)
GFR calc non Af Amer: 49 mL/min — ABNORMAL LOW (ref >90)
Glucose, Bld: 81 mg/dL (ref 70–99)
Total Bilirubin: 0.5 mg/dL (ref 0.3–1.2)

## 2013-01-12 LAB — AMMONIA: Ammonia: 24 umol/L (ref 11–60)

## 2013-01-12 LAB — CG4 I-STAT (LACTIC ACID): Lactic Acid, Venous: 1.65 mmol/L (ref 0.5–2.2)

## 2013-01-12 MED ORDER — ONDANSETRON HCL 4 MG/2ML IJ SOLN: 4.0000 mg | Freq: Four times a day (QID) | INTRAMUSCULAR | Status: DC | PRN

## 2013-01-12 MED ORDER — ACETAMINOPHEN 325 MG PO TABS
650.0000 mg | ORAL_TABLET | Freq: Four times a day (QID) | ORAL | Status: DC | PRN
  Administered 2013-01-16: 650 mg via ORAL
  Filled 2013-01-12: qty 2

## 2013-01-12 MED ORDER — DILTIAZEM HCL ER 180 MG PO CP24
180.0000 mg | ORAL_CAPSULE | Freq: Every day | ORAL | Status: DC
  Administered 2013-01-12 – 2013-01-18 (×7): 180 mg via ORAL
  Filled 2013-01-12 (×8): qty 1

## 2013-01-12 MED ORDER — CEPHALEXIN 500 MG PO CAPS
500.0000 mg | ORAL_CAPSULE | Freq: Four times a day (QID) | ORAL | Status: DC
  Administered 2013-01-12 – 2013-01-13 (×2): 500 mg via ORAL
  Filled 2013-01-12 (×7): qty 1

## 2013-01-12 MED ORDER — INFLUENZA VAC SPLIT QUAD 0.5 ML IM SUSP
0.5000 mL | INTRAMUSCULAR | Status: AC
  Administered 2013-01-13: 0.5 mL via INTRAMUSCULAR
  Filled 2013-01-12: qty 0.5

## 2013-01-12 MED ORDER — LORAZEPAM 2 MG/ML IJ SOLN
5.0000 mg | Freq: Once | INTRAMUSCULAR | Status: AC
  Administered 2013-01-12: 17:00:00 2 mg via INTRAMUSCULAR
  Filled 2013-01-12: qty 3

## 2013-01-12 MED ORDER — POLYETHYLENE GLYCOL 3350 17 G PO PACK
17.0000 g | PACK | Freq: Every day | ORAL | Status: DC | PRN
  Filled 2013-01-12: qty 1

## 2013-01-12 MED ORDER — SODIUM CHLORIDE 0.9 % IV SOLN
INTRAVENOUS | Status: DC
  Administered 2013-01-12: 12:00:00 300 mL via INTRAVENOUS
  Administered 2013-01-12: 05:00:00 via INTRAVENOUS

## 2013-01-12 MED ORDER — HEPARIN SODIUM (PORCINE) 5000 UNIT/ML IJ SOLN
5000.0000 [IU] | Freq: Three times a day (TID) | INTRAMUSCULAR | Status: DC
  Administered 2013-01-12 – 2013-01-18 (×19): 5000 [IU] via SUBCUTANEOUS
  Filled 2013-01-12 (×22): qty 1

## 2013-01-12 MED ORDER — PNEUMOCOCCAL VAC POLYVALENT 25 MCG/0.5ML IJ INJ
0.5000 mL | INJECTION | INTRAMUSCULAR | Status: AC
  Administered 2013-01-13: 0.5 mL via INTRAMUSCULAR
  Filled 2013-01-12: qty 0.5

## 2013-01-12 MED ORDER — DIVALPROEX SODIUM ER 500 MG PO TB24
500.0000 mg | ORAL_TABLET | Freq: Every day | ORAL | Status: DC
  Administered 2013-01-13 – 2013-01-14 (×2): 500 mg via ORAL
  Filled 2013-01-12 (×2): qty 1

## 2013-01-12 MED ORDER — ONDANSETRON HCL 4 MG PO TABS: 4.0000 mg | ORAL_TABLET | Freq: Four times a day (QID) | ORAL | Status: DC | PRN

## 2013-01-12 MED ORDER — VALPROATE SODIUM 500 MG/5ML IV SOLN
1000.0000 mg | Freq: Once | INTRAVENOUS | Status: AC
  Administered 2013-01-12: 1000 mg via INTRAVENOUS
  Filled 2013-01-12 (×2): qty 10

## 2013-01-12 MED ORDER — DIVALPROEX SODIUM 500 MG PO DR TAB
500.0000 mg | DELAYED_RELEASE_TABLET | Freq: Every day | ORAL | Status: DC
  Administered 2013-01-12: 500 mg via ORAL
  Filled 2013-01-12 (×2): qty 1

## 2013-01-12 MED ORDER — SODIUM CHLORIDE 0.9 % IV SOLN
1000.0000 mg | Freq: Once | INTRAVENOUS | Status: DC
  Filled 2013-01-12: qty 10

## 2013-01-12 NOTE — Progress Notes (Signed)
Pt was not able to be aroused in beginning of shift. Pt appeared to be shaking and eyes were shaking as well when I lifted eyelids. RR currently was on unit and came to pt's bedside to assess. RR believed pt was having focal seizures. MD was notified and came to assess. At this time pt was more arousable and responding to pain. RR & MD believed that at this point, pt was post ictal. I have continued to reassess pt frequently. Pt is now responding to to communication as well as pain & eyes and body are no longer shaking. Still very lethargic. Will continue to monitor.

## 2013-01-12 NOTE — ED Notes (Signed)
[facility] notified of pt's transfer to [facility]. 

## 2013-01-12 NOTE — Progress Notes (Signed)
FPTS INTERIM NOTE  Called by nurse for report of a possible seizure. Nurse was in room with patient 10-15 minutes prior to getting a call for report of elevated HR to 150s, when she came in room patient was non-responsive, both arms raised above head with slight muscle quivering, eyes fixed with gaze to the right. Initial vitals stable, HR tachycardic (has been elevated since admission). She then appeared to respond slightly, reportedly laughed.   Upon arrival patient was still unresponsive, eyes forward, pupils constricted but slightly reactive.  Responsive to painful stimuli. Ordered ativan IV. Patient again had eyes deviate to the right, raised left hand in air, right arm then extended along with legs with some shaking. Episode lasted approximately 1 minute, at which point extremities relaxed and eyes returned to center. Patient still not responding to voice, only painful stimuli (sternal rub, finger nail pressure). After receiving 2mg  ativan IV patient de-sat to 85%, addition of O2 by [place] immediately increased it to 100%.  Objective: Current vitals (initial BP when I was present 170s/70s) BP 150/77  Pulse 98  Temp(Src) 98.8 F (37.1 C) (Oral)  Resp 20  Ht 5\' 5"  (1.651 m)  Wt 140 lb 11.2 oz (63.821 kg)  BMI 23.41 kg/m2  SpO2 100%  HEENT: pupils constricted, slightly reactive. Dentures present. CV: tachycardic, normal s1/s2, no murmurs appreciated Resp: anterior lung fields clear to auscultation Extremities: no leg edema, 2+ radial pulses  A/P: Patient with known seizure disorder, takes 500mg  depakote ER daily but level low on admission. Restarted initially on 500mg  depakote DR this morning. -2mg  ativan IV given -Consult to neurology -Continuous O2 and oxygen saturation monitoring -Depakote 1g IV load  Tawni Carnes, MD 01/12/2013, 5:43 PM PGY-1, [facility] Family Medicine FPTS Intern Pager: [phone], text pages welcome

## 2013-01-12 NOTE — Progress Notes (Signed)
Pt admitted to unit via [facility] from [facility]. Pt is A&O, but having flight of ideas. Skin is intact and VS stable. Pt's brother is currently at bedside. MD notified of pt's arrival and is currently at bedside. Call bell is within reach. Will continue to monitor.

## 2013-01-12 NOTE — H&P (Signed)
Family Medicine Teaching [facility] Admission History and Physical Service Pager: [phone]  Patient name: [name] Medical record number: [id number] Date of birth: Nov 08, 1940 Age: 72 y.o. Gender: female  Primary Care Provider: Lillia Abed, MD Consultants: None Code Status: Full  Chief Complaint: AMS, agitation  Assessment and Plan: [name] is a 72 y.o. female presenting with AMS for past day. PMH is significant for bipolar, schizoaffective d/o, CKD, HTN, seizure d/o.   #Psych/neuro: AMS, hx of bipolar, schizoaffective d/o, seizure d/o Ddx includes psychosis from super or subtherapeutic depakote vs UTI assc delirium vs dehydration vs unknown ingestion vs intracranial process.  Last valproic acid level drawn 09/21 was therapeutic although brother has cause to believe that pt had not been taking depakote properly for the last week or so. Has been stable on depakote "for many years" according to clinic documentation. Is prescribed this med by Dr. Leonides Grills. 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 UDS neg here.Alcohol/salicylate/acetominophen levels all normal. Will obtain ammonia level. Afebrile and without elevated WBC but Pt also noted to have asymptomatic UTI in ED at [facility]. Sml hgb/few RBCs. Less concerning for rhabdo picture. Brother appears to check on her regularly but unclear whether something could have happened to her in the interim between visits. No head trauma that he is aware of. Pt looks a little dry on exam and also has mild bump in Cr. Does attest to recent increased seizure like activity which prompted pt to come to the ED one week ago. Was not noted to have any seizures while observed in the ED. Unclear whether this was beginning of psychosis or whether she was having true seizures at home. No focal deficits on neuro exam, pt intermittently cooperative.  Plan -ammonia -depakote level -vs -sitter -could consider CT head, esp if pt  noted to have acute decline -Bcx pending (drawn after abx started) -will have psych see in the am -has prolonged Qt, allergy to ativan, would check another EKG in setting of acute agitation and could consider seroquel at that time  #UTI- Asymptomatic, but unclear whether pt would attest to dysuria given psychosis. U/A at [facility] sig for leukocytes, many bacteria. S/p 1gm rocephin. Plan to start keflex after if abx are appropriate at that time Plan -trend fever curve -Ucx pending -will likely transition to PO keflex if patient can cooperate to take PO medications  #AKI- Acute on chronic. Baseline Cr around 1.1-1.2. Most likely 2/2 dehydration. Looks a little dry and it's unclear whether pt has been eating or drinking appropriately given that she lives by herself and unable to provide hx today. Plan for gentle hydration but would avoid fluid overload given renal status Plan -NS @50  -trend Cr -renal diet  #elevated transaminase- patient with normal transaminases one week ago. Expect this is related to her depakote ingestion, as you can see mild elevation in transaminases with chronic use of depakote, though could be over ingestion as well. Will hold depakote at this time until valproic acid levels are obtained. If levels are normal could consider hep panel. Abd [place] with fatty liver. Does not appear to be an acute process. No ab pain on physical exam and no elevated alk phos to indicate gall stone passing. But pt not consistent with complaints. Negative alcohol level making alcoholic hepatitis unlikely. Plan: -CMET in am -hep panel?  #HTN- on dilt at home BP systolic 150s-170s, mildly tachy - suspect she has not taken medication since this episode began Plan -restart dilt here -  monitor vitals  #hypercalcemia- taking calcium supplement at home. 11.4 here  Plan -hold calcium supplement -recheck in the am  #abnormal EKG- long QT (new) and non specific t-wave abnormalities (old). Had stress test in  feb 2013 which also showed t-wave abnormalities that were felt to be non concerning at that time. Had recent ED visit for atypical chest pain where they noted the non-specific T wave changes. She was to have close follow-up following that episode. Plan: -avoid haldol -will monitor on tele -consider repeat EKG to determine if QT prolongation remains  FEN/GI: NS @50 ,  Prophylaxis: hep sq  Disposition: admit for encephalopathy w/up, tx of UTI, improvement of AKI  History of Present Illness: level 5 cavet applies, hx obtained per brother and prior notes  [name] is a 72 y.o. female presenting with AMS per family. According to brother (who is providing history) pt was noted to have an acute change in behavior with flight of ideas, screaming and perseveration on religion starting around 2pm yesterday. Was called by apartment manager due to behavioral concerns. Per brother pt was in usually state of health prior to this time. No trauma, change in medication regimen, recent infectious symptoms.  Although he feels that she may have not been taking her medications correctly for the past week, as he notes this type of behavior has happened previously when she stops her medication. She lives in an apartment by herself and is responsible for all her ADLs. He typically goes to the grocery store for her and checks in on her regularly however can not be sure of how she has been eating and drinking.   Has had 3 similar episodes like this in the past. Always associated with not taking her depakote appropriately. With each episode she has been to behavioral health per the patients brother. Follows with Dr. Leonides Grills for psych meds. Of note was recently in the ED 09/21 as pt believed she had had multiple seizures at home. Valproic acid levels at that time were therapeutic.   In ED at [facility] given 1L bolus NS. Pt found to have evidence of UTI on U/A, was started on rocephin 1gm. Also with some renal insufficiency above  baseline. Lfts were sig for 304/84. UDS neg. Alcohol level <11. Salicylate level <2. BCx were collected after Abx given.    Review Of Systems: Could not perform ROS given pt's mental state. Per brother pt had no acute complaints prior to presentation. ED note from last week notable for pt attesting to insomnia.    Patient Active Problem List   Diagnosis Date Noted  . Mouth ulcer 12/11/2012  . Dysuria 12/01/2012  . Unspecified constipation 11/11/2012  . Abdominal  pain, other specified site 11/11/2012  . Atypical chest pain 06/05/2012  . Indigestion 01/24/2012  . Falls 12/12/2011  . Right ankle sprain 10/01/2011  . Urge incontinence 03/05/2011  . Lower extremity edema 09/28/2010  . Abnormality of gait 06/12/2010  . Thrombocytopenia 06/08/2010  . Dizziness 06/07/2010  . Tremor 06/06/2010  . SCHIZOAFFECTIVE DISORDER 11/28/2009  . KNEE PAIN, RIGHT, CHRONIC 10/25/2009  . ANEMIA, NORMOCYTIC, CHRONIC 11/07/2008  . GOUT 05/25/2008  . CHRONIC KIDNEY DISEASE STAGE III (MODERATE) 08/27/2007  . LOW BACK PAIN, CHRONIC 06/01/2007  . OBESITY, MILD 12/10/2006  . HYPERCHOLESTEROLEMIA 06/12/2006  . BIPOLAR DISORDER 06/12/2006  . HYPERTENSION, BENIGN SYSTEMIC 06/12/2006  . CONVULSIONS, SEIZURES, NOS 06/12/2006   Past Medical History: Past Medical History  Diagnosis Date  . Depression   . Gout   .  Bipolar 1 disorder   . Schizoaffective disorder   . Chronic kidney disease   . Hypertension   . Seizure disorder   . Thrombocytopenia   . Anemia    Past Surgical History: Past Surgical History  Procedure Laterality Date  . Tubal ligation    . Appendectomy     Social History: History  Substance Use Topics  . Smoking status: Never Smoker   . Smokeless tobacco: Never Used  . Alcohol Use: No   Additional social history: lives alone, performs all IADLs/ADLs, brother checks on her on a regular basis Please also refer to relevant sections of EMR.  Family History: History reviewed. No  pertinent family history. Allergies and Medications: Allergies  Allergen Reactions  . Ativan [Lorazepam] Other (See Comments)    Increases fall risk and was told not to take it.  . Benztropine Mesylate Other (See Comments)    Restless legs  . Clonazepam Other (See Comments)    Increases fall risk and was told not to take it.  . Vicodin [Hydrocodone-Acetaminophen] Other (See Comments)    Increases fall risk and was told not to take it.  . Lisinopril Other (See Comments)    cough  . Sulfonamide Derivatives Other (See Comments)    Pt does not remember an allergic reaction to sulfa drugs   No current facility-administered medications on file prior to encounter.   Current Outpatient Prescriptions on File Prior to Encounter  Medication Sig Dispense Refill  . acetaminophen (TYLENOL) 325 MG tablet Take 650 mg by mouth every 6 (six) hours as needed for pain.      . B Complex-C (B-COMPLEX WITH VITAMIN C) tablet Take 1 tablet by mouth daily.      . Calcium Citrate-Vitamin D (CALCIUM CITRATE + D3 PO) Take 1 tablet by mouth 2 (two) times daily.      . divalproex (DEPAKOTE) 500 MG EC tablet Take 500 mg by mouth daily.       . Flaxseed, Linseed, (FLAXSEED OIL PO) Take 1 capsule by mouth daily.      . Multiple Vitamins-Minerals (CENTRUM SILVER ADULT 50+ PO) Take 1 tablet by mouth daily.      . Omega-3 Fatty Acids (OMEGA-3 FISH OIL PO) Take 325 mg by mouth 3 (three) times daily.      . Alum & Mag Hydroxide-Simeth (MAGIC MOUTHWASH W/LIDOCAINE) SOLN Take 2 mLs by mouth 4 (four) times daily as needed.  100 mL  0  . diltiazem (DILACOR XR) 180 MG 24 hr capsule Take 180 mg by mouth daily.      . Menthol, Topical Analgesic, (ICY HOT) 7.5 % (ROLL) MISC Apply 1 each topically as needed (for pain).      . polyethylene glycol powder (GLYCOLAX/MIRALAX) powder Take 17 g by mouth daily as needed.  3350 g  1  . polyvinyl alcohol (LIQUIFILM TEARS) 1.4 % ophthalmic solution Place 1 drop into both eyes as needed. For  dry eyes      . vitamin E 400 UNIT capsule Take 400 Units by mouth at bedtime.         Objective: BP 159/81  Pulse 122  Temp(Src) 99.6 F (37.6 C) (Oral)  Resp 27  SpO2 99% Exam: General: well appearing, sitting up in bed, hyperverbal HEENT: NCAT, PERRL, would not follow commands to test EOMI, pharynx non erythematous with no exudates, no adenopathy, dry mucus membranes  Cardiovascular: RRR, no murmurs, rubs or gallops Respiratory: CTAB, no evidence of increased WOB Abdomen: soft, nt/nd Extremities:  warm and well perfused, moves spontaneously, strong DPs bilaterally, no edema peripherally Skin: no rashes, bruises or lesions Neuro: alert, no gross deficits, able to follow commands somewhat, 5/5 strength in bilateral UE and LE, sensation to light touch intact throughout, CN intact with exception of not cooperating with EOM testing Psych: no insight, denies SI at this time (but also unreliable historian in current state), tangential thinking, verbose, clanging, flight of ideas, stream of consciousness  Labs and Imaging: CBC BMET   Recent Labs Lab 01/11/13 1915 01/12/13 0058  WBC 7.8  --   HGB 13.3 12.9  HCT 38.9 38.0  PLT 176  --     Recent Labs Lab 01/11/13 1915  01/12/13 0058  NA 136  --  142  K 5.4*  < > 3.8  CL 98  --  109  CO2 20  --   --   BUN 26*  --  25*  CREATININE 1.31*  --  1.30*  GLUCOSE 119*  --  100*  CALCIUM 11.4*  --   --   < > = values in this interval not displayed.   AST/ALT- 304/84 Salicylates/Acetaminophen levels nml UDS- neg Lactic acid 1.65 U/A- many bacteria,moderate leukocytes, negative nitrites sml bili, sml hgb Ucx- pending Bcx- pending  Anselm Lis, MD 01/12/2013, 5:06 AM PGY-1, [facility] FPTS Intern pager: [phone], text pages welcome  Upper Level Addendum:  I have seen and evaluated this patient along with Dr. Michail Jewels and reviewed the above note, making necessary revisions in red.   Marikay Alar,  MD Family Medicine PGY-2

## 2013-01-12 NOTE — Progress Notes (Signed)
Per Misty Stanley RN, patient had seizure activity.  MD at bedside.  RN giving 2mg  Ativan IV per orders.  O2 sat 85% on RA placed on 6L [place] initially then weaned to 2L [place] O2 sats remained 100%.  Patient only responsive to painful stim.  RN to call if assistance needed.

## 2013-01-12 NOTE — Consult Note (Signed)
NEURO HOSPITALIST CONSULT NOTE    Reason for Consult: seizures  HPI:                                                                                                                                          [name] is an 73 y.o. female with a past medical history significant for bipolar disorder, schizoaffective disorder, chronic kidney disease, HTN, seizure disorder on Depakote ER 528m g daily, admitted to the hospital due to mental state changes that according to her family consist of " shouting, screaming and talking about religion". They state this is similar to times in the past she has stopped taking her medications. Today, around 5 pm she had a witnessed generalized convulsing that stopped after receiving total dose 5 mg IM ativan.  She was sedated afterwards, but approximately at 7 pm sustained paroxysmal movements of the left arm concerning for a focal motor seizure. Loaded with 1 gram IV Depacon,, as VPA level 11.4. She has been unresponsive since her first seizure this afternoon, but nursing staff reports some improvement in her mental status. No recent fevers, falls, infection, or head injury.    .  Past Medical History  Diagnosis Date  . Depression   . Gout   . Bipolar 1 disorder   . Schizoaffective disorder   . Chronic kidney disease   . Hypertension   . Seizure disorder   . Thrombocytopenia   . Anemia     Past Surgical History  Procedure Laterality Date  . Tubal ligation    . Appendectomy      History reviewed. No pertinent family history.  Social History:  reports that she has never smoked. She has never used smokeless tobacco. She reports that she does not drink alcohol or use illicit drugs.  Allergies  Allergen Reactions  . Ativan [Lorazepam] Other (See Comments)    Increases fall risk and was told not to take it.  . Benztropine Mesylate Other (See Comments)    Restless legs  . Clonazepam Other (See Comments)    Increases fall  risk and was told not to take it.  . Vicodin [Hydrocodone-Acetaminophen] Other (See Comments)    Increases fall risk and was told not to take it.  . Lisinopril Other (See Comments)    cough  . Sulfonamide Derivatives Other (See Comments)    Pt does not remember an allergic reaction to sulfa drugs    MEDICATIONS:  I have reviewed the patient's current medications.   ROS: unable to obtain due to mental status.                                                                                                                           History obtained from chart review and nursing staff.   Physical exam: minimally responsive but in no apparent distress.Blood pressure 121/52, pulse 96, temperature 98.8 F (37.1 C), temperature source Oral, resp. rate 18, height 5\' 5"  (1.651 m), weight 63.821 kg (140 lb 11.2 oz), SpO2 98.00%.  Head: normocephalic. Neck: supple, no bruits, no JVD. Cardiac: no murmurs. Lungs: clear. Abdomen: soft, no tender, no mass. Extremities: no edema.  Neurologic Examination:                                                                                                       Mental Status: Minimally responsive. Cranial Nerves: Pupils 2 mm bilaterally, reactive to light. No gaze preference. Doesn't blink to threat. EOM full without nystagmus. Face appears symmetric. Tongue midline. Motor: Able to move all limbs symmetrically upon painful stimuli Sensory: reacts to pain Deep Tendon Reflexes:  2 all over. Plantars: Right: downgoing   Left: downgoing Cerebellar: Unable to test Gait:  Unable to test CV: pulses palpable throughout    Lab Results  Component Value Date/Time   CHOL 194 07/24/2011  9:43 AM    Results for orders placed during the hospital encounter of 01/11/13 (from the past 48 hour(s))  ACETAMINOPHEN LEVEL     Status: None    Collection Time    01/11/13  7:15 PM      Result Value Range   Acetaminophen (Tylenol), Serum <15.0  10 - 30 ug/mL   Comment:            THERAPEUTIC CONCENTRATIONS VARY     SIGNIFICANTLY. A RANGE OF 10-30     ug/mL MAY BE AN EFFECTIVE     CONCENTRATION FOR MANY PATIENTS.     HOWEVER, SOME ARE BEST TREATED     AT CONCENTRATIONS OUTSIDE THIS     RANGE.     ACETAMINOPHEN CONCENTRATIONS     >150 ug/mL AT 4 HOURS AFTER     INGESTION AND >50 ug/mL AT 12     HOURS AFTER INGESTION ARE     OFTEN ASSOCIATED WITH TOXIC     REACTIONS.  CBC     Status: Abnormal   Collection Time    01/11/13  7:15 PM      Result Value Range  WBC 7.8  4.0 - 10.5 K/uL   RBC 4.74  3.87 - 5.11 MIL/uL   Hemoglobin 13.3  12.0 - 15.0 g/dL   HCT 16.1  09.6 - 04.5 %   MCV 82.1  78.0 - 100.0 fL   MCH 28.1  26.0 - 34.0 pg   MCHC 34.2  30.0 - 36.0 g/dL   RDW 40.9 (*) 81.1 - 91.4 %   Platelets 176  150 - 400 K/uL  COMPREHENSIVE METABOLIC PANEL     Status: Abnormal   Collection Time    01/11/13  7:15 PM      Result Value Range   Sodium 136  135 - 145 mEq/L   Potassium 5.4 (*) 3.5 - 5.1 mEq/L   Chloride 98  96 - 112 mEq/L   CO2 20  19 - 32 mEq/L   Glucose, Bld 119 (*) 70 - 99 mg/dL   BUN 26 (*) 6 - 23 mg/dL   Creatinine, Ser 7.82 (*) 0.50 - 1.10 mg/dL   Calcium 95.6 (*) 8.4 - 10.5 mg/dL   Total Protein 8.1  6.0 - 8.3 g/dL   Albumin 4.1  3.5 - 5.2 g/dL   AST 213 (*) 0 - 37 U/L   ALT 84 (*) 0 - 35 U/L   Alkaline Phosphatase 61  39 - 117 U/L   Total Bilirubin 0.5  0.3 - 1.2 mg/dL   GFR calc non Af Amer 39 (*) >90 mL/min   GFR calc Af Amer 46 (*) >90 mL/min   Comment: (NOTE)     The eGFR has been calculated using the CKD EPI equation.     This calculation has not been validated in all clinical situations.     eGFR's persistently <90 mL/min signify possible Chronic Kidney     Disease.  ETHANOL     Status: None   Collection Time    01/11/13  7:15 PM      Result Value Range   Alcohol, Ethyl (B) <11  0 - 11  mg/dL   Comment:            LOWEST DETECTABLE LIMIT FOR     SERUM ALCOHOL IS 11 mg/dL     FOR MEDICAL PURPOSES ONLY  SALICYLATE LEVEL     Status: Abnormal   Collection Time    01/11/13  7:15 PM      Result Value Range   Salicylate Lvl <2.0 (*) 2.8 - 20.0 mg/dL  URINE RAPID DRUG SCREEN (HOSP PERFORMED)     Status: None   Collection Time    01/11/13  7:41 PM      Result Value Range   Opiates NONE DETECTED  NONE DETECTED   Cocaine NONE DETECTED  NONE DETECTED   Benzodiazepines NONE DETECTED  NONE DETECTED   Amphetamines NONE DETECTED  NONE DETECTED   Tetrahydrocannabinol NONE DETECTED  NONE DETECTED   Barbiturates NONE DETECTED  NONE DETECTED   Comment:            DRUG SCREEN FOR MEDICAL PURPOSES     ONLY.  IF CONFIRMATION IS NEEDED     FOR ANY PURPOSE, NOTIFY LAB     WITHIN 5 DAYS.                LOWEST DETECTABLE LIMITS     FOR URINE DRUG SCREEN     Drug Class       Cutoff (ng/mL)     Amphetamine      1000  Barbiturate      200     Benzodiazepine   200     Tricyclics       300     Opiates          300     Cocaine          300     THC              50  URINALYSIS, ROUTINE W REFLEX MICROSCOPIC     Status: Abnormal   Collection Time    01/11/13  9:55 PM      Result Value Range   Color, Urine AMBER (*) YELLOW   Comment: BIOCHEMICALS MAY BE AFFECTED BY COLOR   APPearance CLOUDY (*) CLEAR   Specific Gravity, Urine 1.025  1.005 - 1.030   pH 5.5  5.0 - 8.0   Glucose, UA NEGATIVE  NEGATIVE mg/dL   Hgb urine dipstick SMALL (*) NEGATIVE   Bilirubin Urine SMALL (*) NEGATIVE   Ketones, ur NEGATIVE  NEGATIVE mg/dL   Protein, ur 161 (*) NEGATIVE mg/dL   Urobilinogen, UA 0.2  0.0 - 1.0 mg/dL   Nitrite NEGATIVE  NEGATIVE   Leukocytes, UA MODERATE (*) NEGATIVE  URINE MICROSCOPIC-ADD ON     Status: Abnormal   Collection Time    01/11/13  9:55 PM      Result Value Range   Squamous Epithelial / LPF RARE  RARE   WBC, UA 7-10  <3 WBC/hpf   RBC / HPF 0-2  <3 RBC/hpf   Bacteria,  UA MANY (*) RARE   Crystals CA OXALATE CRYSTALS (*) NEGATIVE   Urine-Other AMORPHOUS URATES/PHOSPHATES    POTASSIUM     Status: None   Collection Time    01/11/13 10:00 PM      Result Value Range   Potassium 4.2  3.5 - 5.1 mEq/L   Comment: DELTA CHECK NOTED  CG4 I-STAT (LACTIC ACID)     Status: None   Collection Time    01/12/13 12:57 AM      Result Value Range   Lactic Acid, Venous 1.65  0.5 - 2.2 mmol/L  POCT I-STAT, CHEM 8     Status: Abnormal   Collection Time    01/12/13 12:58 AM      Result Value Range   Sodium 142  135 - 145 mEq/L   Potassium 3.8  3.5 - 5.1 mEq/L   Chloride 109  96 - 112 mEq/L   BUN 25 (*) 6 - 23 mg/dL   Creatinine, Ser 0.96 (*) 0.50 - 1.10 mg/dL   Glucose, Bld 045 (*) 70 - 99 mg/dL   Calcium, Ion 4.09  8.11 - 1.30 mmol/L   TCO2 19  0 - 100 mmol/L   Hemoglobin 12.9  12.0 - 15.0 g/dL   HCT 91.4  78.2 - 95.6 %  CBC     Status: Abnormal   Collection Time    01/12/13  7:00 AM      Result Value Range   WBC 6.5  4.0 - 10.5 K/uL   RBC 4.60  3.87 - 5.11 MIL/uL   Hemoglobin 13.0  12.0 - 15.0 g/dL   HCT 21.3  08.6 - 57.8 %   MCV 82.8  78.0 - 100.0 fL   MCH 28.3  26.0 - 34.0 pg   MCHC 34.1  30.0 - 36.0 g/dL   RDW 46.9 (*) 62.9 - 52.8 %   Platelets 183  150 - 400 K/uL  COMPREHENSIVE  METABOLIC PANEL     Status: Abnormal   Collection Time    01/12/13  7:00 AM      Result Value Range   Sodium 141  135 - 145 mEq/L   Potassium 4.1  3.5 - 5.1 mEq/L   Chloride 103  96 - 112 mEq/L   CO2 23  19 - 32 mEq/L   Glucose, Bld 81  70 - 99 mg/dL   BUN 23  6 - 23 mg/dL   Creatinine, Ser 1.61  0.50 - 1.10 mg/dL   Calcium 09.6  8.4 - 04.5 mg/dL   Total Protein 7.9  6.0 - 8.3 g/dL   Albumin 3.9  3.5 - 5.2 g/dL   AST 409 (*) 0 - 37 U/L   ALT 89 (*) 0 - 35 U/L   Alkaline Phosphatase 58  39 - 117 U/L   Total Bilirubin 0.5  0.3 - 1.2 mg/dL   GFR calc non Af Amer 49 (*) >90 mL/min   GFR calc Af Amer 56 (*) >90 mL/min   Comment: (NOTE)     The eGFR has been calculated  using the CKD EPI equation.     This calculation has not been validated in all clinical situations.     eGFR's persistently <90 mL/min signify possible Chronic Kidney     Disease.  PROTIME-INR     Status: None   Collection Time    01/12/13  7:00 AM      Result Value Range   Prothrombin Time 13.1  11.6 - 15.2 seconds   INR 1.01  0.00 - 1.49  VALPROIC ACID LEVEL     Status: Abnormal   Collection Time    01/12/13  7:00 AM      Result Value Range   Valproic Acid Lvl 11.4 (*) 50.0 - 100.0 ug/mL  AMMONIA     Status: None   Collection Time    01/12/13  7:00 AM      Result Value Range   Ammonia 24  11 - 60 umol/L    US Abdomen Complete  01/11/2013   CLINICAL DATA:  Elevated liver function studies.  EXAM: ULTRASOUND ABDOMEN COMPLETE  COMPARISON:  CT scan 06/08/2011.  FINDINGS: Gallbladder  No gallstones or wall thickening. Negative sonographic Murphy's sign.  Common bile duct  Diameter: 4.3 mm  Liver  Diffuse fatty infiltration of the liver with increased echogenicity and decreased through transmission. No focal lesions or intrahepatic biliary dilatation.  IVC  Normal caliber.  Pancreas  Sonographically unremarkable.  Spleen  Normal size and echogenicity without focal lesions.  Right Kidney  Length: 8.2 cm in length. Mild renal cortical thinning but no focal lesions or hydronephrosis.  Left Kidney  Length: 10.2 cm in length. Mild renal cortical thinning but no focal lesion or hydronephrosis.  Abdominal aorta  Normal caliber.  IMPRESSION: 1. Diffuse fatty infiltration of the liver. 2. Normal gallbladder and normal caliber common bowel duct. 3. Mild renal cortical thinning bilaterally but no hydronephrosis.   Electronically Signed   By: Loralie Champagne M.D.   On: 01/11/2013 22:13     Assessment/Plan: 72 Y/o with well known psych disorder and seizures, with recurrent seizures earlier today. Not fully responsive at this time but received total dose 5 mg IV ativan. No lateralizing neurological findings  on exam. No recent fever or medical illnesses. Sub-therapeutic VPA level 11.4 and thus loaded with 1 gram IV Depacon. Prolonged post ictal state. Sedation, and non convulsive SE in the differential diagnosis.  However, nursing staff reports some improvement in mental status.  Will allow her more time tonight and see if her mental status continues to improve. Otherwise, will get continuous EEG monitoring to address possible subclinical seizures. MRI brain if not return to baseline. Check VPA level in am. Can increase daily maintenance dose Depakote ER based on VPA level results. Will follow up.   Wyatt Portela ,MD [facility] [phone]  01/12/2013, 8:30 PM

## 2013-01-12 NOTE — H&P (Signed)
Family Medicine Teaching Service Attending Note  I interviewed and examined patient [name] and reviewed their tests and x-rays.  I discussed with Dr. Birdie Sons and reviewed their note for today.  I agree with their assessment and plan.     Additionally  This AM is awake with rambling coherent speech.  Is oriented to hospital and month with prompting. Neuro exam is nonfocal Low depakote level indicates missing doses - would restart Elevated AST - unsure of cause seems to be decreasing would monitor  Asymptomatic bactiuria pyuria - not sure if this represents a true UTI and doubt is cause of her mental status changes Gentle hydration for mildly elevated crt

## 2013-01-12 NOTE — Progress Notes (Signed)
RN called because patient still only responsive to pain.   Patient with focal seizures, eye lid and upper extremities, eyes deviated to right. Dr Cyril Mourning notified.  He will see patient shortly. Rn to call if assistance needed.

## 2013-01-12 NOTE — Care Management Note (Unsigned)
    Page 1 of 1   01/12/2013     4:44:05 PM   CARE MANAGEMENT NOTE 01/12/2013  Patient:  [name], [name]   Account Number:  [id number]  Date Initiated:  01/12/2013  Documentation initiated by:  Letha Cape  Subjective/Objective Assessment:   dx ams, uti  admit- lives alone.     Action/Plan:   Anticipated DC Date:  01/14/2013   Anticipated DC Plan:  HOME W HOME HEALTH SERVICES      DC Planning Services  CM consult      Choice offered to / List presented to:             Status of service:  In process, will continue to follow Medicare Important Message given?   (If response is "NO", the following Medicare IM given date fields will be blank) Date Medicare IM given:   Date Additional Medicare IM given:    Discharge Disposition:    Per UR Regulation:  Reviewed for med. necessity/level of care/duration of stay  If discussed at Long Length of Stay Meetings, dates discussed:    Comments:  01/12/13 16:43 Letha Cape RN, BSN [phone] patient lives alone, NCM will continue to follow for dc needs.

## 2013-01-12 NOTE — Progress Notes (Signed)
Patient ID: [name], female   DOB: 06-02-1940, 72 y.o.   MRN: [id number]  Patient was under sedation and unable to paricipate in evaluation. Staff RN stated that she has seizure like activity and given Ativan 2 mg less than a hour ago. Will follow up later.    Lariza Cothron,JANARDHAHA R. 01/12/2013 6:20 PM

## 2013-01-13 ENCOUNTER — Inpatient Hospital Stay (HOSPITAL_COMMUNITY): Payer: Medicare Other

## 2013-01-13 DIAGNOSIS — F259 Schizoaffective disorder, unspecified: Secondary | ICD-10-CM

## 2013-01-13 DIAGNOSIS — Z9119 Patient's noncompliance with other medical treatment and regimen: Secondary | ICD-10-CM

## 2013-01-13 DIAGNOSIS — R569 Unspecified convulsions: Secondary | ICD-10-CM

## 2013-01-13 LAB — URINE CULTURE: Colony Count: 9000

## 2013-01-13 LAB — COMPREHENSIVE METABOLIC PANEL
ALT: 61 U/L — ABNORMAL HIGH (ref 0–35)
AST: 114 U/L — ABNORMAL HIGH (ref 0–37)
Alkaline Phosphatase: 48 U/L (ref 39–117)
BUN: 17 mg/dL (ref 6–23)
CO2: 25 mEq/L (ref 19–32)
Creatinine, Ser: 1.06 mg/dL (ref 0.50–1.10)
GFR calc Af Amer: 59 mL/min — ABNORMAL LOW (ref >90)
GFR calc non Af Amer: 51 mL/min — ABNORMAL LOW (ref >90)
Glucose, Bld: 97 mg/dL (ref 70–99)
Potassium: 4.2 mEq/L (ref 3.5–5.1)
Sodium: 139 mEq/L (ref 135–145)
Total Protein: 6.1 g/dL (ref 6.0–8.3)

## 2013-01-13 LAB — CBC
MCHC: 34.3 g/dL (ref 30.0–36.0)
Platelets: 158 10*3/uL (ref 150–400)
RDW: 16 % — ABNORMAL HIGH (ref 11.5–15.5)
WBC: 7.1 10*3/uL (ref 4.0–10.5)

## 2013-01-13 MED ORDER — LORAZEPAM 2 MG/ML IJ SOLN: 1.0000 mg | Freq: Three times a day (TID) | INTRAMUSCULAR | Status: DC | PRN

## 2013-01-13 MED ORDER — LORAZEPAM 1 MG PO TABS
1.0000 mg | ORAL_TABLET | Freq: Three times a day (TID) | ORAL | Status: DC | PRN
  Administered 2013-01-13 – 2013-01-15 (×4): 1 mg via ORAL
  Filled 2013-01-13 (×4): qty 1

## 2013-01-13 NOTE — Progress Notes (Signed)
FMTS Attending Admission Note: [name] Tapper,MD I  have seen and examined this patient, reviewed their chart. I have discussed this patient with the resident. I agree with the resident's findings, assessment and care plan.  Patient was quite aggressive this morning,she did not allow for adequate assessment.I appreciate Neurology and psychiatry follow up. May D/C A/B if Urine Cx neg or with insignificant growth.I recommend 1:1 observation for this patient to avoid harm to self.

## 2013-01-13 NOTE — Discharge Summary (Signed)
Family Medicine Teaching [facility] Discharge Summary  Patient name: [name] Medical record number: [id number] Date of birth: Sep 15, 1940 Age: 72 y.o. Gender: female Date of Admission: 01/11/2013  Date of Discharge: 01/18/2013 Admitting Physician: Carney Living, MD  Primary Care Provider: Lillia Abed, MD Consultants: Neurology, Psychiatry  Indication for Hospitalization: Altered mental status  Discharge Diagnoses/Problem List:  Seizure disorder Schizoaffective disorder  Bipolar disorder Asymptomatic bacteruria and pyuria Elevated liver-associated enzymes Acute kidney injury Chronic kidney disease, stage III Hypertension Hypercalcemia Prolonged QT interval   Disposition: To SNF with memory care unit  Discharge Condition: Stable  Discharge Exam:  BP 145/52  Pulse 70  Temp(Src) 98.4 F (36.9 C) (Oral)  Resp 20  Ht 5\' 5"  (1.651 m)  Wt 141 lb 8.6 oz (64.201 kg)  BMI 23.55 kg/m2  SpO2 99% Gen:  72 y.o.female sitting in chair in NAD HEENT: MMM, posterior oropharynx clear, dentures in place Pulm: Non-labored; CTAB, no wheezes  CV: Regular rate, no murmur appreciated; distal pulses intact/symmetric GI:  +BS; soft, non-tender, non-distended Skin: No rashes, wounds, ulcers Neuro: A&Ox3, CN II-XII without deficits  Brief Hospital Course: [name] is a 72 y.o. female presenting on 01/11/13 with AMS and family concern for medical noncompliance with depakote. PMH is significant for bipolar disorder, schizoaffective disorder, CKD, HTN, and seizure disorder. Per EMR, her most recent seizure was July 2011. She was admitted and found to have sub-therapeutic level of valproic acid (11.4). On 9/30 she experienced a witness episode of convulsions that were successfully treated with ativan and depakote loading dose of 1 gram. Repeat VPA was therapeutic at 62.9. EEG performed the following day showed no epileptic findings and MRI brain was without acute finding.  Psychiatric consultants recommended acute psychiatric hospitalization at that time, and zyprexa 5mg  po prn agitation, as well as ativan 1mg  po prn agitation. Over the following days, she proceeded to clear and her mental status improved greatly. She has required no prn medications for agitation. On day of discharge, psychiatric consultant re-evaluation recommended SNF with memory care unit given patient's wandering behaviors. SNF with wander guard available as memory unit beds are full - will ask for waiting list for memory unit and discharge to this SNF with wander guard.  Urinalysis showed bacteruria with pyuria, rocephin was given followed by keflex which was discontinued after 2 days of therapy when culture showed insignificant growth. [name] never complained of dysuria.  Initial evaluation showed acute kidney injury (Cr 1.31) which returned to baseline (1.06) with IVF. Initially, elevation of AST (304) and ALT (84) was found and thought to be due to NAFLD, and continued a downward trend throughout the hospital course to within normal limits. Abdominal U/S showed fatty liver and normal gallbladder. Hepatitis panel was sent and returned negative.  Hypercalcemia was also found on presentation and resolved after home calcium supplements were held.  She has a history of hypertension which was treated with diltiazem during this hospitalization with moderate control.  Long QT (new) and non specific t-wave abnormalities (old) were seen on ECG. Stress test in feb 2013 showed t-wave abnormalities that were felt to be non concerning at that time. She complained of no chest pain and her ECG later showed a normal QTc interval.   Issues for Follow Up:   Consider repeat ECG as new finding of QTc prolongation (599 msec) was seen on ECG 9/30 which resolved on 10/1. Unchanged T wave abnormality was also seen.    Significant Procedures: None  Significant Labs and Imaging:   Recent Labs Lab 01/12/13 0700  01/13/13 0450 01/17/13 0450  WBC 6.5 7.1 7.3  HGB 13.0 11.0* 11.1*  HCT 38.1 32.1* 32.7*  PLT 183 158 174    Recent Labs Lab 01/11/13 1915  01/12/13 0058 01/12/13 0700 01/13/13 0450 01/17/13 0450  NA 136  --  142 141 139 138  K 5.4*  < > 3.8 4.1 4.2 4.0  CL 98  --  109 103 105 103  CO2 20  --   --  23 25 24   GLUCOSE 119*  --  100* 81 97 99  BUN 26*  --  25* 23 17 18   CREATININE 1.31*  --  1.30* 1.10 1.06 1.06  CALCIUM 11.4*  --   --  10.2 9.2 9.9  ALKPHOS 61  --   --  58 48 43  AST 304*  --   --  269* 114* 17  ALT 84*  --   --  89* 61* 27  ALBUMIN 4.1  --   --  3.9 2.8* 2.5*  < > = values in this interval not displayed.  U/S ABDOMEN 01/11/13  1. Diffuse fatty infiltration of the liver. 2. Normal gallbladder and normal caliber common bowel duct. 3. Mild renal cortical thinning bilaterally but no hydronephrosis.   EEG 01/13/13 No epileptiform activity observed  MRI head 01/14/13 FINDINGS:  Mild ventricular enlargement is stable and compatible with moderate  generalized atrophy. No acute cortical infarct, hemorrhage, or mass  lesion is present. Flow is present in the major intracranial  arteries. The globes and orbits are intact. The left sphenoid sinus  is not pneumatized. The remaining paranasal sinuses and the mastoid  air cells are clear.  IMPRESSION:  1. No acute intracranial abnormality.  2. Stable moderate generalized atrophy with fairly minimal white  matter disease.  Blood cultures drawn 01/12/13: No growth at 5 days Hepatitis panel: negative  Results/Tests Pending at Time of Discharge: None   Discharge Medications:    Medication List    ASK your doctor about these medications       acetaminophen 325 MG tablet  Commonly known as:  TYLENOL  Take 650 mg by mouth every 6 (six) hours as needed for pain.     B-complex with vitamin C tablet  Take 1 tablet by mouth daily.     CALCIUM CITRATE + D3 PO  Take 1 tablet by mouth 2 (two) times daily.      CENTRUM SILVER ADULT 50+ PO  Take 1 tablet by mouth daily.     diltiazem 180 MG 24 hr capsule  Commonly known as:  DILACOR XR  Take 180 mg by mouth daily.     divalproex 500 MG 24 hr tablet  Commonly known as:  DEPAKOTE ER  Take 500 mg by mouth daily.     FLAXSEED OIL PO  Take 1 capsule by mouth daily.     ICY HOT 7.5 % (ROLL) Misc  Generic drug:  Menthol (Topical Analgesic)  Apply 1 each topically as needed (for pain).     magic mouthwash w/lidocaine Soln  Take 2 mLs by mouth 4 (four) times daily as needed.     OMEGA-3 FISH OIL PO  Take 325 mg by mouth 3 (three) times daily.     polyethylene glycol powder powder  Commonly known as:  GLYCOLAX/MIRALAX  Take 17 g by mouth daily as needed.     polyvinyl alcohol 1.4 % ophthalmic solution  Commonly  known as:  LIQUIFILM TEARS  Place 1 drop into both eyes as needed. For dry eyes     vitamin E 400 UNIT capsule  Take 400 Units by mouth at bedtime.        Discharge Instructions: Please refer to Patient Instructions section of EMR for full details.  Patient was counseled important signs and symptoms that should prompt return to medical care, changes in medications, dietary instructions, activity restrictions, and follow up appointments.   Follow-Up Appointments: Follow-up Information   Follow up with Carney Living, MD.   Specialty:  Family Medicine   Contact information:   [address] [place] [address] [phone]     As needed  Follow up with MD at [facility] for medical care.  Hazeline Junker, MD 01/18/2013, 12:29 PM PGY-1, [facility]  Note completed at time of discharge by Leona Singleton, MD  01/18/2013 3:19 PM PGY-2

## 2013-01-13 NOTE — Consult Note (Signed)
Reason for Consult: schizoaffective disorder Referring Physician: Dr.Joyner  [name] is an 72 y.o. female.  HPI: [name] is a 72 y.o. female admitted to [facility] from [facility] for AMS and has PMH of  schizoaffective d/o, Bipolar disorder, CKD, HTN, seizure d/o. Patient brother stated that she is in her normal base line until two weeks ago. She has stopped taking her medication about a week ago and has presented with AMS. She has been on depakote and her current level is low therapeutic at 62.5. Reportedly patient has seen Dr Jasmine Awe at [facility]. UDS neg here. She has asymptomatic UTI in ED at [facility]. Patient Brother appears to check on her regularly and has a Geophysicist/field seismologist to help her five days a week. Reportedly she has recent increased seizure activity which prompted pt to come to the ED one week ago.   MSE: Patient is awake, alert and non cooperative. She has been yelling, screaming and cursing without stimulus. Patient has increased psychomotor activity, trying to get out of bed and not following the directions given to her.   Past Medical History  Diagnosis Date  . Depression   . Gout   . Bipolar 1 disorder   . Schizoaffective disorder   . Chronic kidney disease   . Hypertension   . Seizure disorder   . Thrombocytopenia   . Anemia     Past Surgical History  Procedure Laterality Date  . Tubal ligation    . Appendectomy      History reviewed. No pertinent family history.  Social History:  reports that she has never smoked. She has never used smokeless tobacco. She reports that she does not drink alcohol or use illicit drugs.  Allergies:  Allergies  Allergen Reactions  . Ativan [Lorazepam] Other (See Comments)    Increases fall risk and was told not to take it.  . Benztropine Mesylate Other (See Comments)    Restless legs  . Clonazepam Other (See Comments)    Increases fall risk and was told not to take it.  . Vicodin  [Hydrocodone-Acetaminophen] Other (See Comments)    Increases fall risk and was told not to take it.  . Lisinopril Other (See Comments)    cough  . Sulfonamide Derivatives Other (See Comments)    Pt does not remember an allergic reaction to sulfa drugs    Medications: I have reviewed the patient's current medications.  Results for orders placed during the hospital encounter of 01/11/13 (from the past 48 hour(s))  ACETAMINOPHEN LEVEL     Status: None   Collection Time    01/11/13  7:15 PM      Result Value Range   Acetaminophen (Tylenol), Serum <15.0  10 - 30 ug/mL   Comment:            THERAPEUTIC CONCENTRATIONS VARY     SIGNIFICANTLY. A RANGE OF 10-30     ug/mL MAY BE AN EFFECTIVE     CONCENTRATION FOR MANY PATIENTS.     HOWEVER, SOME ARE BEST TREATED     AT CONCENTRATIONS OUTSIDE THIS     RANGE.     ACETAMINOPHEN CONCENTRATIONS     >150 ug/mL AT 4 HOURS AFTER     INGESTION AND >50 ug/mL AT 12     HOURS AFTER INGESTION ARE     OFTEN ASSOCIATED WITH TOXIC     REACTIONS.  CBC     Status: Abnormal   Collection Time  01/11/13  7:15 PM      Result Value Range   WBC 7.8  4.0 - 10.5 K/uL   RBC 4.74  3.87 - 5.11 MIL/uL   Hemoglobin 13.3  12.0 - 15.0 g/dL   HCT 16.1  09.6 - 04.5 %   MCV 82.1  78.0 - 100.0 fL   MCH 28.1  26.0 - 34.0 pg   MCHC 34.2  30.0 - 36.0 g/dL   RDW 40.9 (*) 81.1 - 91.4 %   Platelets 176  150 - 400 K/uL  COMPREHENSIVE METABOLIC PANEL     Status: Abnormal   Collection Time    01/11/13  7:15 PM      Result Value Range   Sodium 136  135 - 145 mEq/L   Potassium 5.4 (*) 3.5 - 5.1 mEq/L   Chloride 98  96 - 112 mEq/L   CO2 20  19 - 32 mEq/L   Glucose, Bld 119 (*) 70 - 99 mg/dL   BUN 26 (*) 6 - 23 mg/dL   Creatinine, Ser 7.82 (*) 0.50 - 1.10 mg/dL   Calcium 95.6 (*) 8.4 - 10.5 mg/dL   Total Protein 8.1  6.0 - 8.3 g/dL   Albumin 4.1  3.5 - 5.2 g/dL   AST 213 (*) 0 - 37 U/L   ALT 84 (*) 0 - 35 U/L   Alkaline Phosphatase 61  39 - 117 U/L   Total  Bilirubin 0.5  0.3 - 1.2 mg/dL   GFR calc non Af Amer 39 (*) >90 mL/min   GFR calc Af Amer 46 (*) >90 mL/min   Comment: (NOTE)     The eGFR has been calculated using the CKD EPI equation.     This calculation has not been validated in all clinical situations.     eGFR's persistently <90 mL/min signify possible Chronic Kidney     Disease.  ETHANOL     Status: None   Collection Time    01/11/13  7:15 PM      Result Value Range   Alcohol, Ethyl (B) <11  0 - 11 mg/dL   Comment:            LOWEST DETECTABLE LIMIT FOR     SERUM ALCOHOL IS 11 mg/dL     FOR MEDICAL PURPOSES ONLY  SALICYLATE LEVEL     Status: Abnormal   Collection Time    01/11/13  7:15 PM      Result Value Range   Salicylate Lvl <2.0 (*) 2.8 - 20.0 mg/dL  URINE RAPID DRUG SCREEN (HOSP PERFORMED)     Status: None   Collection Time    01/11/13  7:41 PM      Result Value Range   Opiates NONE DETECTED  NONE DETECTED   Cocaine NONE DETECTED  NONE DETECTED   Benzodiazepines NONE DETECTED  NONE DETECTED   Amphetamines NONE DETECTED  NONE DETECTED   Tetrahydrocannabinol NONE DETECTED  NONE DETECTED   Barbiturates NONE DETECTED  NONE DETECTED   Comment:            DRUG SCREEN FOR MEDICAL PURPOSES     ONLY.  IF CONFIRMATION IS NEEDED     FOR ANY PURPOSE, NOTIFY LAB     WITHIN 5 DAYS.                LOWEST DETECTABLE LIMITS     FOR URINE DRUG SCREEN     Drug Class       Cutoff (ng/mL)  Amphetamine      1000     Barbiturate      200     Benzodiazepine   200     Tricyclics       300     Opiates          300     Cocaine          300     THC              50  URINALYSIS, ROUTINE W REFLEX MICROSCOPIC     Status: Abnormal   Collection Time    01/11/13  9:55 PM      Result Value Range   Color, Urine AMBER (*) YELLOW   Comment: BIOCHEMICALS MAY BE AFFECTED BY COLOR   APPearance CLOUDY (*) CLEAR   Specific Gravity, Urine 1.025  1.005 - 1.030   pH 5.5  5.0 - 8.0   Glucose, UA NEGATIVE  NEGATIVE mg/dL   Hgb urine  dipstick SMALL (*) NEGATIVE   Bilirubin Urine SMALL (*) NEGATIVE   Ketones, ur NEGATIVE  NEGATIVE mg/dL   Protein, ur 161 (*) NEGATIVE mg/dL   Urobilinogen, UA 0.2  0.0 - 1.0 mg/dL   Nitrite NEGATIVE  NEGATIVE   Leukocytes, UA MODERATE (*) NEGATIVE  URINE MICROSCOPIC-ADD ON     Status: Abnormal   Collection Time    01/11/13  9:55 PM      Result Value Range   Squamous Epithelial / LPF RARE  RARE   WBC, UA 7-10  <3 WBC/hpf   RBC / HPF 0-2  <3 RBC/hpf   Bacteria, UA MANY (*) RARE   Crystals CA OXALATE CRYSTALS (*) NEGATIVE   Urine-Other AMORPHOUS URATES/PHOSPHATES    URINE CULTURE     Status: None   Collection Time    01/11/13  9:55 PM      Result Value Range   Specimen Description URINE, CLEAN CATCH     Special Requests NONE     Culture  Setup Time       Value: 01/12/2013 09:56     Performed at [facility] Count       Value: 9,000 COLONIES/ML     Performed at [facility]   Culture       Value: INSIGNIFICANT GROWTH     Performed at [facility]   Report Status 01/13/2013 FINAL    POTASSIUM     Status: None   Collection Time    01/11/13 10:00 PM      Result Value Range   Potassium 4.2  3.5 - 5.1 mEq/L   Comment: DELTA CHECK NOTED  CULTURE, BLOOD (ROUTINE X 2)     Status: None   Collection Time    01/12/13 12:50 AM      Result Value Range   Specimen Description BLOOD LEFT HAND     Special Requests BOTTLES DRAWN AEROBIC AND ANAEROBIC 1.5CC     Culture  Setup Time       Value: 01/12/2013 04:14     Performed at [facility]   Culture       Value:        BLOOD CULTURE RECEIVED NO GROWTH TO DATE CULTURE WILL BE HELD FOR 5 DAYS BEFORE ISSUING A FINAL NEGATIVE REPORT     Performed at [facility]   Report Status PENDING    CG4 I-STAT (LACTIC ACID)     Status: None   Collection  Time    01/12/13 12:57 AM      Result Value Range   Lactic Acid, Venous 1.65  0.5 - 2.2 mmol/L  POCT I-STAT, CHEM 8     Status: Abnormal    Collection Time    01/12/13 12:58 AM      Result Value Range   Sodium 142  135 - 145 mEq/L   Potassium 3.8  3.5 - 5.1 mEq/L   Chloride 109  96 - 112 mEq/L   BUN 25 (*) 6 - 23 mg/dL   Creatinine, Ser 4.54 (*) 0.50 - 1.10 mg/dL   Glucose, Bld 098 (*) 70 - 99 mg/dL   Calcium, Ion 1.19  1.47 - 1.30 mmol/L   TCO2 19  0 - 100 mmol/L   Hemoglobin 12.9  12.0 - 15.0 g/dL   HCT 82.9  56.2 - 13.0 %  CULTURE, BLOOD (ROUTINE X 2)     Status: None   Collection Time    01/12/13  1:05 AM      Result Value Range   Specimen Description BLOOD RIGHT HAND     Special Requests BOTTLES DRAWN AEROBIC AND ANAEROBIC 1CC     Culture  Setup Time       Value: 01/12/2013 04:14     Performed at [facility]   Culture       Value:        BLOOD CULTURE RECEIVED NO GROWTH TO DATE CULTURE WILL BE HELD FOR 5 DAYS BEFORE ISSUING A FINAL NEGATIVE REPORT     Performed at [facility]   Report Status PENDING    CBC     Status: Abnormal   Collection Time    01/12/13  7:00 AM      Result Value Range   WBC 6.5  4.0 - 10.5 K/uL   RBC 4.60  3.87 - 5.11 MIL/uL   Hemoglobin 13.0  12.0 - 15.0 g/dL   HCT 86.5  78.4 - 69.6 %   MCV 82.8  78.0 - 100.0 fL   MCH 28.3  26.0 - 34.0 pg   MCHC 34.1  30.0 - 36.0 g/dL   RDW 29.5 (*) 28.4 - 13.2 %   Platelets 183  150 - 400 K/uL  COMPREHENSIVE METABOLIC PANEL     Status: Abnormal   Collection Time    01/12/13  7:00 AM      Result Value Range   Sodium 141  135 - 145 mEq/L   Potassium 4.1  3.5 - 5.1 mEq/L   Chloride 103  96 - 112 mEq/L   CO2 23  19 - 32 mEq/L   Glucose, Bld 81  70 - 99 mg/dL   BUN 23  6 - 23 mg/dL   Creatinine, Ser 4.40  0.50 - 1.10 mg/dL   Calcium 10.2  8.4 - 72.5 mg/dL   Total Protein 7.9  6.0 - 8.3 g/dL   Albumin 3.9  3.5 - 5.2 g/dL   AST 366 (*) 0 - 37 U/L   ALT 89 (*) 0 - 35 U/L   Alkaline Phosphatase 58  39 - 117 U/L   Total Bilirubin 0.5  0.3 - 1.2 mg/dL   GFR calc non Af Amer 49 (*) >90 mL/min   GFR calc Af Amer 56 (*) >90 mL/min    Comment: (NOTE)     The eGFR has been calculated using the CKD EPI equation.     This calculation has not been validated in all clinical situations.  eGFR's persistently <90 mL/min signify possible Chronic Kidney     Disease.  PROTIME-INR     Status: None   Collection Time    01/12/13  7:00 AM      Result Value Range   Prothrombin Time 13.1  11.6 - 15.2 seconds   INR 1.01  0.00 - 1.49  VALPROIC ACID LEVEL     Status: Abnormal   Collection Time    01/12/13  7:00 AM      Result Value Range   Valproic Acid Lvl 11.4 (*) 50.0 - 100.0 ug/mL  AMMONIA     Status: None   Collection Time    01/12/13  7:00 AM      Result Value Range   Ammonia 24  11 - 60 umol/L  VALPROIC ACID LEVEL     Status: None   Collection Time    01/13/13  4:50 AM      Result Value Range   Valproic Acid Lvl 62.9  50.0 - 100.0 ug/mL  COMPREHENSIVE METABOLIC PANEL     Status: Abnormal   Collection Time    01/13/13  4:50 AM      Result Value Range   Sodium 139  135 - 145 mEq/L   Potassium 4.2  3.5 - 5.1 mEq/L   Chloride 105  96 - 112 mEq/L   CO2 25  19 - 32 mEq/L   Glucose, Bld 97  70 - 99 mg/dL   BUN 17  6 - 23 mg/dL   Creatinine, Ser 4.09  0.50 - 1.10 mg/dL   Calcium 9.2  8.4 - 81.1 mg/dL   Total Protein 6.1  6.0 - 8.3 g/dL   Albumin 2.8 (*) 3.5 - 5.2 g/dL   AST 914 (*) 0 - 37 U/L   ALT 61 (*) 0 - 35 U/L   Alkaline Phosphatase 48  39 - 117 U/L   Total Bilirubin 0.3  0.3 - 1.2 mg/dL   GFR calc non Af Amer 51 (*) >90 mL/min   GFR calc Af Amer 59 (*) >90 mL/min   Comment: (NOTE)     The eGFR has been calculated using the CKD EPI equation.     This calculation has not been validated in all clinical situations.     eGFR's persistently <90 mL/min signify possible Chronic Kidney     Disease.  CBC     Status: Abnormal   Collection Time    01/13/13  4:50 AM      Result Value Range   WBC 7.1  4.0 - 10.5 K/uL   RBC 3.90  3.87 - 5.11 MIL/uL   Hemoglobin 11.0 (*) 12.0 - 15.0 g/dL   HCT 78.2 (*) 95.6 -  46.0 %   MCV 82.3  78.0 - 100.0 fL   MCH 28.2  26.0 - 34.0 pg   MCHC 34.3  30.0 - 36.0 g/dL   RDW 21.3 (*) 08.6 - 57.8 %   Platelets 158  150 - 400 K/uL    US Abdomen Complete  01/11/2013   CLINICAL DATA:  Elevated liver function studies.  EXAM: ULTRASOUND ABDOMEN COMPLETE  COMPARISON:  CT scan 06/08/2011.  FINDINGS: Gallbladder  No gallstones or wall thickening. Negative sonographic Murphy's sign.  Common bile duct  Diameter: 4.3 mm  Liver  Diffuse fatty infiltration of the liver with increased echogenicity and decreased through transmission. No focal lesions or intrahepatic biliary dilatation.  IVC  Normal caliber.  Pancreas  Sonographically unremarkable.  Spleen  Normal size  and echogenicity without focal lesions.  Right Kidney  Length: 8.2 cm in length. Mild renal cortical thinning but no focal lesions or hydronephrosis.  Left Kidney  Length: 10.2 cm in length. Mild renal cortical thinning but no focal lesion or hydronephrosis.  Abdominal aorta  Normal caliber.  IMPRESSION: 1. Diffuse fatty infiltration of the liver. 2. Normal gallbladder and normal caliber common bowel duct. 3. Mild renal cortical thinning bilaterally but no hydronephrosis.   Electronically Signed   By: Loralie Champagne M.D.   On: 01/11/2013 22:13    Positive for aggressive behavior, behavior problems, bipolar, sleep disturbance and Seizure activity and agitation. Blood pressure 135/70, pulse 94, temperature 98.7 F (37.1 C), temperature source Oral, resp. rate 18, height 5\' 5"  (1.651 m), weight 63.821 kg (140 lb 11.2 oz), SpO2 97.00%.   Assessment/Plan: S/P Seizure episode Non compliance with medication Schizoaffective disorder by history  Recommendation: Continue Depakote for seizure activity and monitor of therapeutic levels EEG completed and result pending Recommend acute psychiatric hospitalization when medically cleared for crisis stabilization and medication management. May use ativan 1 mg PO/IV/IM TID for  agitation and aggression Recommend no antipsychotics until cleared from neuro. Appreciate psych evaluation and follow up as clinically needed  Vanya Carberry,JANARDHAHA R. 01/13/2013, 4:23 PM

## 2013-01-13 NOTE — Progress Notes (Signed)
Subjective: Continues to be confused  Exam: Filed Vitals:   01/13/13 0547  BP: 146/58  Pulse: 86  Temp: 99.1 F (37.3 C)  Resp: 18   Gen: In bed, NAD MS: Awake, Alert, follows some simple commands incompletely. Does not reliably answer any questions. Has a fluent aphasia.  ZO:XWRUE, blinking too frequently to assess blink to threat, face symmetric, crossess midline with eyes in both directions.  Motor: no drift, moves all extremities.  Sensory:responds to nox stim x 4.   Labs: Valproate 62  Impression: 72 yo F with known seizure disorder and persistent aphasia after seizure yesterday. Possible psychiatric component, however would rule out organic causes of aphasia with EEG and MRI.  Recommendations: 1) MRI brain. 2) EEG.  3) will continue to follow  4) repeat valproate level tomorrow.   [name], MD [facility] [phone]  If 7pm- 7am, please page neurology on call at [phone].

## 2013-01-13 NOTE — Progress Notes (Signed)
Family Medicine Teaching Service Daily Progress Note Intern Pager: [phone]  Patient name: [name] Medical record number: [id number] Date of birth: May 24, 1940 Age: 72 y.o. Gender: female  Primary Care Provider: Lillia Abed, MD Consultants: Neurology Code Status: Full  Pt Overview and Major Events to Date:  9/30: Seizure activity witnessed.  10/1: VPA level 62.9, EEG, MRI today  Assessment and Plan: [name] is a 72 y.o. female presenting with AMS for past day. PMH is significant for bipolar, schizoaffective d/o, CKD, HTN, seizure d/o.   #Psych/neuro: seizure disorder vs. Cerebrovascular disease vs. Psychiatric exacerbation. - Overnight events: witnessed generalized convulsion terminated with 5 mg IM ativan and sustained paroxysmal movements of the left arm concerning for a focal motor seizure.  - VPA level 62.9 up from 11.4 s/p 1gm load [ ]  Neuro consulted, to get EEG to assess for subclinical seizures, MRI today, and repeat VPA level tomorrow AM.   [ ]  Psychiatry to follow up Re: effect of depakote on psychiatric conditions and possible antipsychotic medication  [ ]  BCx pending (drawn after abx started)  - Documented allergy to ativan "told not to take it", but tolerated 9/30. Benefits certainly outweigh risks of falls at this time.   #UTI- Asymptomatic, but unclear whether pt would attest to dysuria given psychosis. U/A at [facility] sig for leukocytes, many bacteria. S/p 1gm rocephin. Plan to start keflex after if abx are appropriate at that time  - Keflex started 9/29 will D/C in light of culture results - UCx: insignificant growth  # Elevated transaminase- patient with normal transaminases one week ago. Depakote is subtherapeutic, so likely not the etiology. No h/o substance abuse, EtOH negative. Abd [place] with fatty liver. Does not appear to be an acute process. No abd pain on physical exam and no elevated alk phos to indicate gall stone passing.  - Trending downward  [ ]  Hep  panel pending  #AKI- Acute on chronic. Baseline Cr around 1.1-1.2. Most likely 2/2 dehydration. - Cr 1.06 back to baseline.  -renal diet   #HTN- on dilt at home BP systolic 150s-170s, mildly tachy - Restart diltiazem - Monitor VS  #Hypercalcemia- taking calcium supplement at home. 11.4 here   - holding calcium supplement  - normal on recheck  # Abnormal ECG- long QT (new) and non specific t-wave abnormalities (old). Stress test in feb 2013 showed t-wave abnormalities that were felt to be non concerning at that time. Had recent ED visit for atypical chest pain where they noted the non-specific T wave changes. She was to have close follow-up following that episode.  - QTc 578 on 9/29, was normal 9/21 [ ]  will recheck ECG this AM  FEN/GI: NS @50  Prophylaxis: hep sq   Disposition: Continue neuro work up, PT, OT, and CSW consulted for recommendations after discharge.   Subjective: Not oriented or cooperative tearing ECG leads off this morning. Fluently aphasic.   Objective: Temp:  [98.6 F (37 C)-99.1 F (37.3 C)] 99.1 F (37.3 C) (10/01 0547) Pulse Rate:  [86-104] 86 (10/01 0547) Resp:  [16-20] 18 (10/01 0547) BP: (121-174)/(52-85) 146/58 mmHg (10/01 0547) SpO2:  [85 %-100 %] 100 % (10/01 0547) Physical Exam: Gen: 72 yo female  HEENT: MMM, EOMI, PERRL, poor dentition CV: RRR, no MRG Resp: CTABL, no wheezes noted Abd: SNTND, BS present, no guarding or organomegaly Ext: No edema noted, full ROM Neuro: Alert not oriented, CN II-XII grossly intact Psych: speech is aphasic, not goal directed  Laboratory:  Recent Labs  Lab 01/11/13 1915 01/12/13 0058 01/12/13 0700 01/13/13 0450  WBC 7.8  --  6.5 7.1  HGB 13.3 12.9 13.0 11.0*  HCT 38.9 38.0 38.1 32.1*  PLT 176  --  183 158    Recent Labs Lab 01/11/13 1915  01/12/13 0058 01/12/13 0700 01/13/13 0450  NA 136  --  142 141 139  K 5.4*  < > 3.8 4.1 4.2  CL 98  --  109 103 105  CO2 20  --   --  23 25  BUN 26*  --  25*  23 17  CREATININE 1.31*  --  1.30* 1.10 1.06  CALCIUM 11.4*  --   --  10.2 9.2  PROT 8.1  --   --  7.9 6.1  BILITOT 0.5  --   --  0.5 0.3  ALKPHOS 61  --   --  58 48  ALT 84*  --   --  89* 61*  AST 304*  --   --  269* 114*  GLUCOSE 119*  --  100* 81 97  < > = values in this interval not displayed.  Imaging/Diagnostic Tests: U/S ABDOMEN 01/11/13 1. Diffuse fatty infiltration of the liver. 2. Normal gallbladder and normal caliber common bowel duct. 3. Mild renal cortical thinning bilaterally but no hydronephrosis.    Hazeline Junker, MD 01/13/2013, 8:17 AM PGY-1, [facility] FPTS Intern pager: [phone], text pages welcome

## 2013-01-13 NOTE — Progress Notes (Signed)
Patient agitated throughout the day and has been screaming out periodically.  Patient's speech is slurred and incomprehensible.  Patient is unable to follow commands and becomes agitated and aggressive when caretakers enter the room.  Family medicine gave orders to D/C telemetry and saline lock patient's IV because the cords were contributing to her agitation.  Patient was continuously unhooking her IV and disconnecting her telemetry leads.  Dr. Elsie Saas (psych) saw patient and indicates in his note that 1mg  Ativan PO/IV/IM may be used TID for agitation and aggression.  Patient is currently resting in her room.  Will notify attending provider of new recommendations from psych.

## 2013-01-13 NOTE — Progress Notes (Signed)
EEG Completed; Results Pending  

## 2013-01-14 ENCOUNTER — Inpatient Hospital Stay (HOSPITAL_COMMUNITY): Payer: Medicare Other

## 2013-01-14 LAB — HEPATITIS PANEL, ACUTE
HCV Ab: NEGATIVE
Hep A IgM: NEGATIVE
Hep B C IgM: NEGATIVE
Hepatitis B Surface Ag: NEGATIVE

## 2013-01-14 MED ORDER — OLANZAPINE 5 MG PO TBDP
5.0000 mg | ORAL_TABLET | Freq: Two times a day (BID) | ORAL | Status: DC | PRN
  Filled 2013-01-14: qty 1

## 2013-01-14 MED ORDER — LORAZEPAM 2 MG/ML IJ SOLN
INTRAMUSCULAR | Status: AC
  Filled 2013-01-14: qty 1

## 2013-01-14 MED ORDER — DIVALPROEX SODIUM ER 500 MG PO TB24
500.0000 mg | ORAL_TABLET | Freq: Two times a day (BID) | ORAL | Status: DC
  Administered 2013-01-14 – 2013-01-18 (×8): 500 mg via ORAL
  Filled 2013-01-14 (×9): qty 1

## 2013-01-14 MED ORDER — LORAZEPAM 2 MG/ML IJ SOLN
1.0000 mg | INTRAMUSCULAR | Status: DC | PRN
  Administered 2013-01-14: 10:00:00 1 mg via INTRAVENOUS

## 2013-01-14 NOTE — Progress Notes (Signed)
Noted that [facility] has indicated they cannot accept patient at this time due to her not being  Independent with ADL's. This will likely be the case with other such settings. Clinicals have been sent elsewhere for consideration as well. CSW will update as we are notified of availability   Reece Levy, MSW [phone]

## 2013-01-14 NOTE — Progress Notes (Signed)
FMTS Attending Admission Note: [name] Conde,MD I  have seen and examined this patient, reviewed their chart. I have discussed this patient with the resident. I agree with the resident's findings, assessment and care plan.  

## 2013-01-14 NOTE — Evaluation (Signed)
Occupational Therapy Evaluation Patient Details Name: [name] MRN: [id number] DOB: 06-19-1940 Today's Date: 01/14/2013 Time: 4098-1191 OT Time Calculation (min): 23 min  OT Assessment / Plan / Recommendation History of present illness [name] is a 72 y.o. female presenting with AMS for past day. PMH is significant for bipolar, schizoaffective d/o, CKD, HTN, seizure d/o.    Clinical Impression   Pt demos decline inf function with ADLs and ADL mobility safety and would benefit from acute OT services to address impairments to increase level of function and safety. Pt states that she lives at home alone with caregiver 5 days/wk, however it is unclear how accurate info is at this time. Pt requires 2 person assist for mobility and max - total A for ADLs and short term SNF would be safest d/c recommendation at this time    OT Assessment  Patient needs continued OT Services    Follow Up Recommendations  SNF;Supervision/Assistance - 24 hour    Barriers to Discharge Decreased caregiver support Pt states that she lives ay home alome with caregiver 5 days/wk for ADLs and home mgt  Equipment Recommendations  None recommended by OT;Other (comment) (TBD)    Recommendations for Other Services    Frequency  Min 2X/week    Precautions / Restrictions Precautions Precautions: Fall Restrictions Weight Bearing Restrictions: No   Pertinent Vitals/Pain moans with touch to LEs during bed mobility    ADL  Grooming: Performed;Wash/dry hands;Wash/dry face;Moderate assistance Upper Body Bathing: Simulated;Maximal assistance Lower Body Bathing: +1 Total assistance Upper Body Dressing: Maximal assistance Lower Body Dressing: +1 Total assistance Toilet Transfer: Simulated;+2 Total assistance Toilet Transfer Method: Sit to stand Toileting - Clothing Manipulation and Hygiene: +1 Total assistance Where Assessed - Toileting Clothing Manipulation and Hygiene: Standing Tub/Shower Transfer Method:  Not assessed Transfers/Ambulation Related to ADLs: cues for safety, correct hand placement ADL Comments: pt requires extensive assist with all ADLs due to cognitive deficits and ecreaseed dynamic sitting balance    OT Diagnosis: Generalized weakness;Cognitive deficits  OT Problem List: Decreased strength;Decreased knowledge of use of DME or AE;Decreased knowledge of precautions;Decreased activity tolerance;Decreased cognition;Decreased safety awareness;Impaired balance (sitting and/or standing) OT Treatment Interventions: Self-care/ADL training;Therapeutic exercise;Patient/family education;Neuromuscular education;Balance training;Therapeutic activities;DME and/or AE instruction   OT Goals(Current goals can be found in the care plan section) Acute Rehab OT Goals Patient Stated Goal: none stated OT Goal Formulation: With patient Time For Goal Achievement: 01/21/13 Potential to Achieve Goals: Fair ADL Goals Pt Will Perform Grooming: with min assist;sitting Pt Will Perform Upper Body Bathing: with mod assist Pt Will Perform Lower Body Bathing: with max assist;with mod assist;sitting/lateral leans;sit to/from stand Pt Will Perform Upper Body Dressing: with min assist;sitting Pt Will Transfer to Toilet: with total assist;with max assist;bedside commode Pt Will Perform Toileting - Clothing Manipulation and hygiene: with max assist;with mod assist;sitting/lateral leans;sit to/from stand  Visit Information  Last OT Received On: 01/14/13 Assistance Needed: +2 Reason Eval/Treat Not Completed: Patient at procedure or test/ unavailable History of Present Illness: [name] is a 72 y.o. female presenting with AMS for past day. PMH is significant for bipolar, schizoaffective d/o, CKD, HTN, seizure d/o.        Prior Functioning     Home Living Family/patient expects to be discharged to:: Skilled nursing facility Living Arrangements: Alone Prior Function Level of Independence: Needs  assistance Gait / Transfers Assistance Needed: Unclear if she needs assist, has rollator ADL's / Homemaking Assistance Needed: Aide helps with cooking, cleaning, bathing, dressing Communication  Communication: Expressive difficulties Dominant Hand: Right         Vision/Perception Vision - History Baseline Vision: Other (comment) (per pt) Patient Visual Report: No change from baseline Perception Perception: Within Functional Limits   Cognition  Cognition Arousal/Alertness: Lethargic Behavior During Therapy: Restless;Flat affect Overall Cognitive Status: No family/caregiver present to determine baseline cognitive functioning    Extremity/Trunk Assessment Upper Extremity Assessment Upper Extremity Assessment: Overall WFL for tasks assessed;Generalized weakness LUE Deficits / Details: Pt reports pain with L UE and then later said R UE was painful.     Mobility Bed Mobility Bed Mobility: Supine to Sit;Sitting - Scoot to [place] of Bed;Sit to Supine;Scooting to [facility] Supine to Sit: 1: +2 Total assist Sitting - Scoot to Edge of Bed: 1: +2 Total assist Sit to Supine: 1: +2 Total assist Scooting to HOB: 1: +2 Total assist Transfers Transfers: Stand to Sit;Sit to Stand Sit to Stand: From bed;1: +2 Total assist Stand to Sit: To bed;1: +2 Total assist Details for Transfer Assistance: cues for correct hand placement     Exercise     Balance Balance Balance Assessed: Yes Dynamic Sitting Balance Dynamic Sitting - Balance Support: Feet supported;No upper extremity supported;During functional activity Dynamic Sitting - Level of Assistance: 3: Mod assist   End of Session OT - End of Session Equipment Utilized During Treatment: Gait belt;Rolling walker Activity Tolerance: Patient limited by lethargy Patient left: in bed;with call bell/phone within reach;with bed alarm set  GO     [name] 01/14/2013, 3:06 PM

## 2013-01-14 NOTE — Consult Note (Signed)
Reason for Consult: schizoaffective disorder Referring Physician: Dr.Joyner  [name] is an 72 y.o. female.  HPI: this is psych consultation follow up notes. Chart reviewed and case discussed with Dr. Konrad Dolores. Patient has mild generalized slow activity on her EEG and rest of the neuro work up is negative. Patient has PMH of  schizoaffective d/o, Bipolar disorder, and seizure d/o. Patient has better control of her agitation today and has no further seizure activity. he has been on depakote and her current level is low therapeutic at 62.5. Reportedly patient has seen Dr Jasmine Awe at [facility].   MSE: Patient is awake, alert, calmer and non cooperative. She has been less yelling, screaming and cursing without stimulus today. Patient has increased psychomotor activity while awake, trying to get out of bed and not following the directions given to her.    Past Medical History  Diagnosis Date  . Depression   . Gout   . Bipolar 1 disorder   . Schizoaffective disorder   . Chronic kidney disease   . Hypertension   . Seizure disorder   . Thrombocytopenia   . Anemia     Past Surgical History  Procedure Laterality Date  . Tubal ligation    . Appendectomy      History reviewed. No pertinent family history.  Social History:  reports that she has never smoked. She has never used smokeless tobacco. She reports that she does not drink alcohol or use illicit drugs.  Allergies:  Allergies  Allergen Reactions  . Ativan [Lorazepam] Other (See Comments)    Increases fall risk and was told not to take it.  . Benztropine Mesylate Other (See Comments)    Restless legs  . Clonazepam Other (See Comments)    Increases fall risk and was told not to take it.  . Vicodin [Hydrocodone-Acetaminophen] Other (See Comments)    Increases fall risk and was told not to take it.  . Lisinopril Other (See Comments)    cough  . Sulfonamide Derivatives Other (See Comments)    Pt does  not remember an allergic reaction to sulfa drugs    Medications: I have reviewed the patient's current medications.  Results for orders placed during the hospital encounter of 01/11/13 (from the past 48 hour(s))  VALPROIC ACID LEVEL     Status: None   Collection Time    01/13/13  4:50 AM      Result Value Range   Valproic Acid Lvl 62.9  50.0 - 100.0 ug/mL  COMPREHENSIVE METABOLIC PANEL     Status: Abnormal   Collection Time    01/13/13  4:50 AM      Result Value Range   Sodium 139  135 - 145 mEq/L   Potassium 4.2  3.5 - 5.1 mEq/L   Chloride 105  96 - 112 mEq/L   CO2 25  19 - 32 mEq/L   Glucose, Bld 97  70 - 99 mg/dL   BUN 17  6 - 23 mg/dL   Creatinine, Ser 7.82  0.50 - 1.10 mg/dL   Calcium 9.2  8.4 - 95.6 mg/dL   Total Protein 6.1  6.0 - 8.3 g/dL   Albumin 2.8 (*) 3.5 - 5.2 g/dL   AST 213 (*) 0 - 37 U/L   ALT 61 (*) 0 - 35 U/L   Alkaline Phosphatase 48  39 - 117 U/L   Total Bilirubin 0.3  0.3 - 1.2 mg/dL   GFR calc non Af Amer 51 (*) >  90 mL/min   GFR calc Af Amer 59 (*) >90 mL/min   Comment: (NOTE)     The eGFR has been calculated using the CKD EPI equation.     This calculation has not been validated in all clinical situations.     eGFR's persistently <90 mL/min signify possible Chronic Kidney     Disease.  CBC     Status: Abnormal   Collection Time    01/13/13  4:50 AM      Result Value Range   WBC 7.1  4.0 - 10.5 K/uL   RBC 3.90  3.87 - 5.11 MIL/uL   Hemoglobin 11.0 (*) 12.0 - 15.0 g/dL   HCT 47.4 (*) 25.9 - 56.3 %   MCV 82.3  78.0 - 100.0 fL   MCH 28.2  26.0 - 34.0 pg   MCHC 34.3  30.0 - 36.0 g/dL   RDW 87.5 (*) 64.3 - 32.9 %   Platelets 158  150 - 400 K/uL  HEPATITIS PANEL, ACUTE     Status: None   Collection Time    01/13/13  4:50 AM      Result Value Range   Hepatitis B Surface Ag NEGATIVE  NEGATIVE   HCV Ab NEGATIVE  NEGATIVE   Hep A IgM NEGATIVE  NEGATIVE   Hep B C IgM NEGATIVE  NEGATIVE   Comment: (NOTE)     High levels of Hepatitis B Core IgM  antibody are detectable     during the acute stage of Hepatitis B. This antibody is used     to differentiate current from past HBV infection.     Performed at [facility]  VALPROIC ACID LEVEL     Status: None   Collection Time    01/14/13  4:30 AM      Result Value Range   Valproic Acid Lvl 61.5  50.0 - 100.0 ug/mL    Mr Brain Wo Contrast  01/14/2013   CLINICAL DATA:  Altered mental status. Agitation.  EXAM: MRI HEAD WITHOUT CONTRAST  TECHNIQUE: Multiplanar, multisequence MR imaging was performed. No intravenous contrast was administered.  COMPARISON:  CT head without contrast 05/13/2012.  FINDINGS: Mild ventricular enlargement is stable and compatible with moderate generalized atrophy. No acute cortical infarct, hemorrhage, or mass lesion is present. Flow is present in the major intracranial arteries. The globes and orbits are intact. The left sphenoid sinus is not pneumatized. The remaining paranasal sinuses and the mastoid air cells are clear.  IMPRESSION: 1. No acute intracranial abnormality. 2. Stable moderate generalized atrophy with fairly minimal white matter disease.   Electronically Signed   By: Gennette Pac   On: 01/14/2013 11:04    Positive for aggressive behavior, behavior problems, bipolar, sleep disturbance and Seizure activity and agitation. Blood pressure 150/85, pulse 85, temperature 99.2 F (37.3 C), temperature source Oral, resp. rate 16, height 5\' 5"  (1.651 m), weight 64.229 kg (141 lb 9.6 oz), SpO2 98.00%.   Assessment/Plan: S/P Seizure episode Non compliance with medication Schizoaffective disorder by history  Recommendation: Continue Depakote for seizure activity and monitor of therapeutic levels EEG completed and result - Mild generalized slow activity Recommend acute psychiatric hospitalization when medically cleared for crisis stabilization and medication management. Patient can not accepted at [facility] when she can not participate in ADL's May refer to  [facility] unit at [facility] center May use ativan 1 mg PO/IV/IM TID for agitation and aggression Recommend Zyprexa Zydis 5 mg PO Q8H PRN of psychosis and agitation Apreciate  psych consultation and follow up as clinically needed  Raymont Andreoni,JANARDHAHA R. 01/14/2013, 4:36 PM

## 2013-01-14 NOTE — Progress Notes (Signed)
OT Cancellation Note  Patient Details Name: [name] MRN: [id number] DOB: 12-Oct-1940   Cancelled Treatment:    Reason Eval/Treat Not Completed: Patient at procedure or test/ unavailable. Pt of the floor for MRI, will re attempt later today  Galen Manila 01/14/2013, 10:03 AM

## 2013-01-14 NOTE — Progress Notes (Signed)
Family Medicine Teaching Service Daily Progress Note Intern Pager: [phone]  Patient name: [name] Medical record number: [id number] Date of birth: [birth year] Age: 72 y.o. Gender: female  Primary Care Provider: Lillia Abed, MD Consultants: Neurology Code Status: Full  Pt Overview and Major Events to Date:  9/30: Seizure activity witnessed.  10/1: VPA level 62.9, EEG, MRI today  Assessment and Plan: [name] is a 72 y.o. female presenting with AMS for past day. PMH is significant for bipolar, schizoaffective d/o, CKD, HTN, seizure d/o.   #Psych/neuro: Seizure disorder vs. Cerebrovascular disease vs. Psychiatric exacerbation. - Overnight events: agitation requiring D/C of telemetry, saline lock IV for pt safety - VPA level stable. 61.5 from 62.9 (up from 11.4) s/p 1gm load [ ]  EEG read pending [ ]  MRI today - Psychiatry recommending acute psychiatric hospitalization when medically cleared for crisis stabilization and medication management. - Ativan 1mg  TID prn agitation - BCx 9/30 NGTD (drawn after abx started)   #Bacteruria with pyuria, asymptomatic - S/p rocephin 1g and keflex x2days.  - UCx: insignificant growth  # Elevated transaminase (AST > ALT)- Presumed NAFLD. Normal transaminases one week PTA. Abd [place] with fatty liver. Depakote is subtherapeutic, so likely not the etiology. No h/o substance abuse, EtOH negative. No abd pain on physical exam, alk phos wnl.  - Trending downward, last checked 10/1 - Ceruloplasmin pending [ ]  Hep panel pending  # AKI- Acute on chronic. Baseline Cr around 1.1-1.2. Most likely 2/2 dehydration. - Cr 1.06 back to baseline.  - Monitor UOP - Renal diet   # HTN- systolic 150s-170s, mildly tachy - On home diltiazem - Monitor VS  # Hypercalcemia- 11.4 at presentation. Ionized normal   - holding calcium supplement  - normal on recheck  # Abnormal ECG- long QT (new) and non specific t-wave abnormalities (old). Stress test in feb  2013 showed t-wave abnormalities that were felt to be non concerning at that time. Had recent ED visit for atypical chest pain where they noted the non-specific T wave changes. She was to have close follow-up following that episode.  - QTc 578 on 9/29, was normal 9/21 - No CP - Tele D/Ced for pt safety (agitation)  FEN/GI: SLIV Prophylaxis: hep sq   Disposition: Continue neuro work up, PT, OT, and CSW consulted for recommendations after discharge.   Subjective: Sedated post MRI but more oriented than yesterday. Complains of no pain.   Objective: Temp:  [98.7 F (37.1 C)-99.3 F (37.4 C)] 99.3 F (37.4 C) (10/02 0501) Pulse Rate:  [79-94] 79 (10/02 0501) Resp:  [16-18] 16 (10/02 0501) BP: (135-153)/(56-77) 153/77 mmHg (10/02 0501) SpO2:  [96 %-98 %] 96 % (10/02 0501) Weight:  [141 lb 9.6 oz (64.229 kg)] 141 lb 9.6 oz (64.229 kg) (10/02 0502) Physical Exam: Gen: 72 yo female sleeping quietly in NAD HEENT: MMM, EOMI, PERRL, poor dentition Neck:  L mild neck edema with neck flexion CV: RRR, no MRG Resp: CTABL, no wheezes noted. No stridor. Room air.  Abd: SNTND, BS present, no guarding or organomegaly Ext: No edema noted, full ROM Neuro: Alert and oriented to person and place and month, CN II-XII grossly intact Psych: speech is aphasic, not goal directed  Laboratory:  Recent Labs Lab 01/11/13 1915 01/12/13 0058 01/12/13 0700 01/13/13 0450  WBC 7.8  --  6.5 7.1  HGB 13.3 12.9 13.0 11.0*  HCT 38.9 38.0 38.1 32.1*  PLT 176  --  183 158    Recent Labs Lab  01/11/13 1915  01/12/13 0058 01/12/13 0700 01/13/13 0450  NA 136  --  142 141 139  K 5.4*  < > 3.8 4.1 4.2  CL 98  --  109 103 105  CO2 20  --   --  23 25  BUN 26*  --  25* 23 17  CREATININE 1.31*  --  1.30* 1.10 1.06  CALCIUM 11.4*  --   --  10.2 9.2  PROT 8.1  --   --  7.9 6.1  BILITOT 0.5  --   --  0.5 0.3  ALKPHOS 61  --   --  58 48  ALT 84*  --   --  89* 61*  AST 304*  --   --  269* 114*  GLUCOSE 119*   --  100* 81 97  < > = values in this interval not displayed.  Imaging/Diagnostic Tests: U/S ABDOMEN 01/11/13 1. Diffuse fatty infiltration of the liver. 2. Normal gallbladder and normal caliber common bowel duct. 3. Mild renal cortical thinning bilaterally but no hydronephrosis.    [name] Junker, MD 01/14/2013, 7:22 AM PGY-1, [facility] Family Medicine FPTS Intern pager: [phone], text pages welcome

## 2013-01-14 NOTE — BH Assessment (Addendum)
Phone call from Dr. Jarvis Newcomer [phone]. He reports pt is now medically cleared and that Dr. Elsie Saas recommended inpt treatment for pt once medically clear. Dr. Jarvis Newcomer asks that pt be considered for placement at [facility]. Writer will followup with TTS to try to place pt at [facility] or have CSW try to place at another appropriate facility.  Evette Cristal, [place] Assessment Counselor

## 2013-01-14 NOTE — Evaluation (Signed)
Physical Therapy Evaluation Patient Details Name: [name] MRN: [id number] DOB: 03-25-1941 Today's Date: 01/14/2013 Time: 4098-1191 PT Time Calculation (min): 23 min  PT Assessment / Plan / Recommendation History of Present Illness  [name] is a 72 y.o. female presenting with AMS for past day. PMH is significant for bipolar, schizoaffective d/o, CKD, HTN, seizure d/o.   Clinical Impression  Pt presents with generalized weakness and decreased tolerance for functional activity. Cognition limits pt during session, and accurate history was not obtained at this time. At the time of PT eval, pt was able to perform transfer to/from EOB with max assist +2, and ambulate with mod assist. This patient would benefit from continued skilled PT interventions to address functional limitations, improve safety and independence with functional mobility, and return to PLOF.    PT Assessment  Patient needs continued PT services    Follow Up Recommendations  SNF    Does the patient have the potential to tolerate intense rehabilitation      Barriers to Discharge Inaccessible home environment;Decreased caregiver support Unclear how much support pt will have at home.    Equipment Recommendations       Recommendations for Other Services     Frequency Min 3X/week    Precautions / Restrictions Precautions Precautions: Fall Restrictions Weight Bearing Restrictions: No   Pertinent Vitals/Pain Pt reports pain first in her L arm, and then later states her pain is in her right arm.      Mobility  Bed Mobility Bed Mobility: Supine to Sit;Sitting - Scoot to [place] of Bed;Sit to Supine;Scooting to [facility] Supine to Sit: 1: +2 Total assist Supine to Sit: Patient Percentage: 10% Sitting - Scoot to Edge of Bed: 1: +2 Total assist Sitting - Scoot to Edge of Bed: Patient Percentage: 10% Sit to Supine: 1: +2 Total assist Sit to Supine: Patient Percentage: 10% Scooting to HOB: 1: +2 Total assist Scooting  to [facility]: Patient Percentage: 0% Details for Bed Mobility Assistance: VC's for sequencing and technique. Pt cries out when she is sat up but does not state that she is hurting anywhere. Transfers Transfers: Sit to Stand;Stand to Sit Sit to Stand: From bed;1: +2 Total assist Sit to Stand: Patient Percentage: 30% Stand to Sit: To bed;1: +2 Total assist Stand to Sit: Patient Percentage: 50% Details for Transfer Assistance: cues for correct hand placement Ambulation/Gait Ambulation/Gait Assistance: 3: Mod assist Ambulation Distance (Feet): 10 Feet Assistive device: Rolling walker Ambulation/Gait Assistance Details: Pt ambulated 4 feet forwards and 4 feet backwards, and then 2 feet towards the head of the bed. Pt was cued for sequencing and safety awareness with the walker. Gait Pattern: Step-through pattern;Decreased stride length;Shuffle;Narrow base of support Gait velocity: decreased Stairs: No    Exercises     PT Diagnosis: Difficulty walking  PT Problem List: Decreased strength;Decreased range of motion;Decreased activity tolerance;Decreased balance;Decreased mobility;Decreased knowledge of use of DME;Decreased safety awareness;Decreased cognition;Decreased knowledge of precautions;Pain PT Treatment Interventions: DME instruction;Stair training;Gait training;Functional mobility training;Therapeutic activities;Therapeutic exercise;Neuromuscular re-education;Cognitive remediation;Patient/family education     PT Goals(Current goals can be found in the care plan section) Acute Rehab PT Goals Patient Stated Goal: none stated PT Goal Formulation: With patient Time For Goal Achievement: 01/21/13 Potential to Achieve Goals: Fair  Visit Information  Last PT Received On: 01/14/13 Assistance Needed: +2 History of Present Illness: [name] is a 72 y.o. female presenting with AMS for past day. PMH is significant for bipolar, schizoaffective d/o, CKD, HTN, seizure d/o.  Prior  Functioning  Home Living Family/patient expects to be discharged to:: Skilled nursing facility Living Arrangements: Alone Prior Function Level of Independence: Needs assistance Gait / Transfers Assistance Needed: Unclear if she needs assist, has rollator ADL's / Homemaking Assistance Needed: Aide helps with cooking, cleaning, bathing, dressing Communication Communication: Expressive difficulties Dominant Hand: Right    Cognition  Cognition Arousal/Alertness: Lethargic Behavior During Therapy: Restless;Flat affect Overall Cognitive Status: No family/caregiver present to determine baseline cognitive functioning    Extremity/Trunk Assessment Upper Extremity Assessment Upper Extremity Assessment: Overall WFL for tasks assessed;Generalized weakness LUE Deficits / Details: Pt reports pain with L UE and then later said R UE was painful. Lower Extremity Assessment Lower Extremity Assessment: Generalized weakness Cervical / Trunk Assessment Cervical / Trunk Assessment: Kyphotic   Balance Balance Balance Assessed: Yes Static Sitting Balance Static Sitting - Balance Support: Bilateral upper extremity supported;Feet supported Static Sitting - Level of Assistance: 3: Mod assist Static Sitting - Comment/# of Minutes: 5 Dynamic Sitting Balance Dynamic Sitting - Balance Support: Feet supported;No upper extremity supported;During functional activity Dynamic Sitting - Level of Assistance: 3: Mod assist  End of Session PT - End of Session Equipment Utilized During Treatment: Gait belt Activity Tolerance: Patient limited by fatigue Patient left: in bed;with call bell/phone within reach Nurse Communication: Mobility status  GP     Ruthann Cancer 01/14/2013, 5:12 PM  Ruthann Cancer, PT, DPT Acute Rehabilitation Services

## 2013-01-14 NOTE — BH Assessment (Signed)
Per Isaac Laud, pt isn't appropriate for [facility] as she can't independently perform ADLs. Writer left voicemail for CSW at [facility]  [phone] updating her re: [facility] not taking pt.   Evette Cristal, [place] Assessment Counselor

## 2013-01-14 NOTE — Procedures (Signed)
History: 72 yo F with seizures and psychosis.   Sedation: None  Background: There is a well defined posterior dominant rhythm of 9 Hz that attenuates with eye opening. There are sleep structures seen which are bilateral in nature. Most of this EEG is performed during drowsiness, and therefore a mild encephlopathic state is difficult to rule out.   Photic stimulation: Physiologic driving is not performed  EEG Abnormalities: 1) Mild generalized slow activity.   Clinical Interpretation: This borderline EEG is recorded in the drowsy and sleep state. Because the patient is drowsy throughout most of the recording, it is difficult to rule out a mild non-specific encephalopathic state. There was no seizure or seizure predisposition recorded on this study.   [name], MD [facility] [phone]  If 7pm- 7am, please page neurology on call at [phone].

## 2013-01-14 NOTE — Progress Notes (Signed)
Pt pulled IV out. On call MD paged and said it is ok to not restart another IV.

## 2013-01-14 NOTE — Progress Notes (Signed)
PT Cancellation Note  Patient Details Name: [name] MRN: [id number] DOB: 09/21/1940   Cancelled Treatment:    Reason Eval/Treat Not Completed: Patient at procedure or test/unavailable. Pt off unit for MRI. Will attempt again this afternoon, time permitting.   Ruthann Cancer 01/14/2013, 10:44 AM  Ruthann Cancer, PT, DPT Acute Rehabilitation Services

## 2013-01-14 NOTE — Progress Notes (Signed)
NEURO HOSPITALIST PROGRESS NOTE   SUBJECTIVE:                                                                                                                        No complaints, resting well, follows commands.   OBJECTIVE:                                                                                                                           Vital signs in last 24 hours: Temp:  [98.7 F (37.1 C)-99.3 F (37.4 C)] 99.3 F (37.4 C) (10/02 0501) Pulse Rate:  [79-94] 79 (10/02 0501) Resp:  [16-18] 16 (10/02 0501) BP: (135-153)/(56-77) 153/77 mmHg (10/02 0501) SpO2:  [96 %-98 %] 96 % (10/02 0501) Weight:  [64.229 kg (141 lb 9.6 oz)] 64.229 kg (141 lb 9.6 oz) (10/02 0502)  Intake/Output from previous day: 10/01 0701 - 10/02 0700 In: 542 [P.O.:342; I.V.:200] Out: -  Intake/Output this shift:   Nutritional status: Renal  Past Medical History  Diagnosis Date  . Depression   . Gout   . Bipolar 1 disorder   . Schizoaffective disorder   . Chronic kidney disease   . Hypertension   . Seizure disorder   . Thrombocytopenia   . Anemia      Neurologic Exam:  Mental Status: Alert, oriented to hospital, year and October.  Speech fluent without evidence of aphasia.  Follows simple commands only. Cranial Nerves: II:  Visual fields grossly normal, pupils equal, round, reactive to light and accommodation III,IV, VI: ptosis not present, extra-ocular motions intact bilaterally V,VII: smile symmetric, facial light touch sensation normal bilaterally VIII: hearing normal bilaterally IX,X: gag reflex present XI: bilateral shoulder shrug XII: midline tongue extension Motor: Moves all extremities antigravity to command with 5/5 strength Sensory: Pinprick and light touch intact throughout, bilaterally Deep Tendon Reflexes:  Right: Upper Extremity   Left: Upper extremity   biceps (C-5 to C-6) 2/4   biceps (C-5 to C-6) 2/4 tricep (C7) 2/4    triceps  (C7) 2/4 Brachioradialis (C6) 2/4  Brachioradialis (C6) 2/4  Lower Extremity Lower Extremity  quadriceps (L-2 to L-4) 2/4   quadriceps (L-2 to L-4) 2/4 Achilles (S1) 1/4   Achilles (S1) 1/4  Plantars:  Right: downgoing   Left: downgoing Cerebellar: normal finger-to-nose,  normal heel-to-shin test CV: pulses palpable throughout    Lab Results: Lab Results  Component Value Date/Time   CHOL 194 07/24/2011  9:43 AM   Lipid Panel No results found for this basename: CHOL, TRIG, HDL, CHOLHDL, VLDL, LDLCALC,  in the last 72 hours  Studies/Results: Mr Brain Wo Contrast  01/14/2013   CLINICAL DATA:  Altered mental status. Agitation.  EXAM: MRI HEAD WITHOUT CONTRAST  TECHNIQUE: Multiplanar, multisequence MR imaging was performed. No intravenous contrast was administered.  COMPARISON:  CT head without contrast 05/13/2012.  FINDINGS: Mild ventricular enlargement is stable and compatible with moderate generalized atrophy. No acute cortical infarct, hemorrhage, or mass lesion is present. Flow is present in the major intracranial arteries. The globes and orbits are intact. The left sphenoid sinus is not pneumatized. The remaining paranasal sinuses and the mastoid air cells are clear.  IMPRESSION: 1. No acute intracranial abnormality. 2. Stable moderate generalized atrophy with fairly minimal white matter disease.   Electronically Signed   By: Gennette Pac   On: 01/14/2013 11:04    MEDICATIONS                                                                                                                        Scheduled: . diltiazem  180 mg Oral Daily  . divalproex  500 mg Oral Daily  . heparin  5,000 Units Subcutaneous Q8H  . LORazepam        ASSESSMENT/PLAN:                                                                                                           Impression: 72 yo F with known seizure disorder and persistent aphasia after seizure yesterday. MRI of brain was negative for acute  stroke. At this time VPA 61.4.  EEG shows no epileptiform activity. No further neurology recommendations. Agree with psych placement.  Neurology S/O  Assessment and plan discussed with with attending physician and they are in agreement.    Felicie Morn PA-C [facility] [phone]  01/14/2013, 12:11 PM

## 2013-01-14 NOTE — Clinical Social Work Note (Signed)
Patient disoriented x4 when CSW attempted to complete psychosocial assessment. CSW attempted to contact brother R.L. Talton at number listed on patient's registration record, but there was no answer. CSW left message and will wait for call back. CSW will continue to follow.   Roddie Mc, [place], [place], [phone]

## 2013-01-15 DIAGNOSIS — D649 Anemia, unspecified: Secondary | ICD-10-CM

## 2013-01-15 NOTE — Progress Notes (Signed)
Family Medicine Teaching Service Daily Progress Note Intern Pager: [phone]  Patient name: [name] Medical record number: [id number] Date of birth: 1941/01/11 Age: 72 y.o. Gender: female  Primary Care Provider: Lillia Abed, MD Consultants: Neurology Code Status: Full  Pt Overview and Major Events to Date:  9/30: Seizure activity witnessed.  10/1: VPA level 62.9, MRI without acute change 10/2: EEG read with global slowing, no epileptiform activity  Assessment and Plan: [name] is a 72 y.o. female presenting with AMS for past day. PMH is significant for bipolar, schizoaffective d/o, CKD, HTN, seizure d/o.   # Psych/neuro: Seizure disorder vs. Cerebrovascular disease vs. Psychosis - Overnight events: agitation resulting in loss of IV access - EEG: No seizure or seizure predisposition recorded. Mild encephlopathic state is difficult to rule out. Mild generalized slow activity.  - VPA level stable. 61.5 from 62.9 (up from 11.4) s/p 1gm load - Psychiatry recommending acute psychiatric hospitalization - zyprexa 5mg  tab prn agitation due to psychosis - BCx 9/30 NGTD (drawn after abx started)   # Bacteruria with pyuria, asymptomatic - S/p rocephin 1g and keflex x2days.  - UCx: insignificant growth  # Elevated transaminase (AST > ALT)- Presumed NAFLD. Normal transaminases one week PTA. Abd [place] with fatty liver. Depakote is subtherapeutic, so likely not the etiology. No h/o substance abuse, EtOH negative. No abd pain on physical exam, alk phos wnl.  - Trending downward, last checked 10/1 - Ceruloplasmin pending - Hep panel negative  # AKI- Acute on chronic. Baseline Cr around 1.1-1.2. Most likely 2/2 dehydration. - Cr 1.06 back to baseline.  - Monitor UOP - Renal diet   # HTN- Improving. 140s/60s.  - On home diltiazem - Monitor VS  # Hypercalcemia- 11.4 at presentation. Ionized normal   - holding calcium supplement  - normal on recheck  # Abnormal ECG- long QT  (new) and non specific t-wave abnormalities (old). Stress test in feb 2013 showed t-wave abnormalities that were felt to be non concerning at that time. Had recent ED visit for atypical chest pain where they noted the non-specific T wave changes. She was to have close follow-up following that episode.  - QTc 578 on 9/29, was normal 9/21 - No CP - Tele D/Ced for pt safety (agitation)  FEN/GI: SLIV Prophylaxis: hep sq   Disposition: Cleared after neurological work up, psych recommends acute psych hospitalization, complicated by pt's dependent status concerning ADLs. CSW working on this.  Subjective: Agitation resulting in loss of IV access overnight. Pt without pain complaints. More lucid this AM than previously.   Objective: Temp:  [98.4 F (36.9 C)-99.2 F (37.3 C)] 98.4 F (36.9 C) (10/03 0525) Pulse Rate:  [85-87] 87 (10/03 0525) Resp:  [16-18] 18 (10/03 0525) BP: (141-150)/(63-85) 143/63 mmHg (10/03 0525) SpO2:  [98 %-100 %] 98 % (10/03 0525) Physical Exam: Gen: 72 yo female sleeping quietly in NAD HEENT: MMM, EOMI, PERRL, poor dentition Neck:  L mild neck edema with neck flexion CV: RRR, no MRG Resp: CTABL, no wheezes noted. No stridor. Room air.  Abd: SNTND, BS present, no guarding or organomegaly Ext: No edema noted, full ROM. No pain on palpation of feet when sleeping.  Neuro: Alert and oriented to person and place and month, CN II-XII grossly intact Psych: speech is aphasic, not goal directed  Laboratory:  Recent Labs Lab 01/11/13 1915 01/12/13 0058 01/12/13 0700 01/13/13 0450  WBC 7.8  --  6.5 7.1  HGB 13.3 12.9 13.0 11.0*  HCT 38.9  38.0 38.1 32.1*  PLT 176  --  183 158    Recent Labs Lab 01/11/13 1915  01/12/13 0058 01/12/13 0700 01/13/13 0450  NA 136  --  142 141 139  K 5.4*  < > 3.8 4.1 4.2  CL 98  --  109 103 105  CO2 20  --   --  23 25  BUN 26*  --  25* 23 17  CREATININE 1.31*  --  1.30* 1.10 1.06  CALCIUM 11.4*  --   --  10.2 9.2  PROT 8.1  --    --  7.9 6.1  BILITOT 0.5  --   --  0.5 0.3  ALKPHOS 61  --   --  58 48  ALT 84*  --   --  89* 61*  AST 304*  --   --  269* 114*  GLUCOSE 119*  --  100* 81 97  < > = values in this interval not displayed.  Imaging/Diagnostic Tests: U/S ABDOMEN 01/11/13 1. Diffuse fatty infiltration of the liver. 2. Normal gallbladder and normal caliber common bowel duct. 3. Mild renal cortical thinning bilaterally but no hydronephrosis.   EEG Background: There is a well defined posterior dominant rhythm of 9 Hz that attenuates with eye opening. There are sleep structures seen which are bilateral in nature. Most of this EEG is performed during drowsiness, and therefore a mild encephlopathic state is difficult to rule out.   Photic stimulation: Physiologic driving is not performed   EEG Abnormalities: 1) Mild generalized slow activity.   Clinical Interpretation: This borderline EEG is recorded in the drowsy and sleep state. Because the patient is drowsy throughout most of the recording, it is difficult to rule out a mild non-specific encephalopathic state. There was no seizure or seizure predisposition recorded on this study.   Hazeline Junker, MD 01/15/2013, 7:19 AM PGY-1, [facility] FPTS Intern pager: [phone], text pages welcome

## 2013-01-15 NOTE — Clinical Social Work Psychosocial (Signed)
Clinical Social Work Department BRIEF PSYCHOSOCIAL ASSESSMENT 01/15/2013  Patient:  [name], [name]     Account Number:  [id number]     Admit date:  01/11/2013  Clinical Social Worker:  Robin Searing  Date/Time:  01/15/2013 10:56 AM  Referred by:  Physician  Date Referred:  01/14/2013 Referred for  Psychosocial assessment   Other Referral:   Inpatient [facility]   Interview type:  Other - See comment Other interview type:   Spoke with patient as well as her brother, [name], [phone], (permission per patient).    PSYCHOSOCIAL DATA Living Status:  ALONE Admitted from facility:   Level of care:   Primary support name:  R.L. [name] brother Primary support relationship to patient:  FAMILY Degree of support available:   good    CURRENT CONCERNS Current Concerns  Post-Acute Placement   Other Concerns:   Mental health concerns  Mobility concerns    SOCIAL WORK ASSESSMENT / PLAN Met with patient who was alert and oriented x4. Patient reports that she lives alone at [employer]- senior apartments where she has an Aide 5 days week to provided help with cooking, cleaning, etc. (3 hours daily). She also reports that her brother, [name], assists her with getting to MD appointments, groceries, etc.  Patient is engaged and answers my questions appropriately- She tells me she sees Dr. Donell Beers for her mental health needs and reports she just saw him recently-  Psychiatry has seen patient here and is recommending possible Geri-Psych unit transfer. I have submitted clinicals to [facility] and await update from them on determination.   Assessment/plan status:  Other - See comment Other assessment/ plan:   Seeking inpatient Geri-Psych placement at d/c.   Information/referral to community resources:   [facility]    PATIENT'S/FAMILY'S RESPONSE TO PLAN OF CARE: Patient pleasant and agrees to plans for further assessment of d/c needs-   Reece Levy,  MSW [phone]

## 2013-01-15 NOTE — Clinical Social Work Placement (Signed)
Clinical Social Work Department CLINICAL SOCIAL WORK PLACEMENT NOTE 01/15/2013  Patient:  [name], [name]  Account Number:  [id number] Admit date:  01/11/2013  Clinical Social Worker:  Lavell Luster  Date/time:  01/15/2013 03:05 PM  Clinical Social Work is seeking post-discharge placement for this patient at the following level of care:   SKILLED NURSING   (*CSW will update this form in Epic as items are completed)   01/15/2013  Patient/family provided with [facility] Department of Clinical Social Work's list of facilities offering this level of care within the geographic area requested by the patient (or if unable, by the patient's family).  01/15/2013  Patient/family informed of their freedom to choose among providers that offer the needed level of care, that participate in Medicare, Medicaid or managed care program needed by the patient, have an available bed and are willing to accept the patient.  01/15/2013  Patient/family informed of [facility]' ownership interest in [facility], as well as of the fact that they are under no obligation to receive care at this facility.  PASARR submitted to EDS on 01/15/2013 PASARR number received from EDS on 01/15/2013  FL2 transmitted to all facilities in geographic area requested by pt/family on  01/15/2013 FL2 transmitted to all facilities within larger geographic area on 01/15/2013  Patient informed that his/her managed care company has contracts with or will negotiate with  certain facilities, including the following:     Patient/family informed of bed offers received:   Patient chooses bed at  Physician recommends and patient chooses bed at    Patient to be transferred to  on   Patient to be transferred to facility by   The following physician request were entered in Epic:   Additional Comments:   Roddie Mc, [place], [place], [phone]

## 2013-01-15 NOTE — Clinical Social Work Note (Signed)
CSW has attempted to contact patient's brother Mr. Talton at number on registration record at 1:12 PM. CSW left another message for brother. CSW unable to complete SNF assessment at this time, will continue to try to reach family of patient.   Roddie Mc, [place], [place], [phone]

## 2013-01-15 NOTE — Progress Notes (Signed)
FMTS Attending Admission Note: [name] Ammon,MD I  have seen and examined this patient, reviewed their chart. I have discussed this patient with the resident. I agree with the resident's findings, assessment and care plan.  

## 2013-01-15 NOTE — Progress Notes (Signed)
Awaiting Geri-Psych placement for transfer per Psychiatry recommendations. [facility] has indicated that patient is being considered but no word on acceptance as of yet. Patient's mobility may be a barrier to this option and would encourage ambulation with PT and other staff as often as possible for strengthening and independence.   Reece Levy, MSW [phone]

## 2013-01-15 NOTE — Progress Notes (Signed)
During morning assessment, patient was comfortable with physical assessment until this RN palpated for pedal pulses.  Patient screamed out in pain only with palpation of right foot.  Screams were consistent, not intermittent.  MD made aware of findings. MD advises this is part of her acute psychological problems.  However, MD advises that he will come reassess the patient.  Patient's skin tone is appropriate for ethnicity.  No swelling is present.  Pedal pulses are +2.

## 2013-01-16 NOTE — Progress Notes (Signed)
FMTS Attending Admission Note: Kehinde Eniola,MD I  have seen and examined this patient, reviewed their chart. I have discussed this patient with the resident. I agree with the resident's findings, assessment and care plan.  

## 2013-01-16 NOTE — Progress Notes (Signed)
Family Medicine Teaching Service Daily Progress Note Intern Pager: [phone]  Patient name: [name] Medical record number: [id number] Date of birth: 19-Sep-1940 Age: 72 y.o. Gender: female  Primary Care Provider: Lillia Abed, MD Consultants: Neurology Code Status: Full  Pt Overview and Major Events to Date:  9/30: Seizure activity witnessed.  10/1: VPA level 62.9, MRI without acute change 10/2: EEG read with global slowing, no epileptiform activity 10/3: CSW faxed to SNF and Geri-psych; awaiting placement  Assessment and Plan: [name] is a 72 y.o. female admitted with AMS for past day, now improving. PMH is significant for bipolar, schizoaffective d/o, CKD, HTN, seizure d/o.   # Psych/neuro: Seizure disorder vs. Cerebrovascular disease vs. Psychosis. - Overnight: Appears more stable and less agitated in last 24 hours. Did not require Ativan or Zypreza prn.  - EEG: No seizure or seizure predisposition recorded. Mild encephlopathic state is difficult to rule out. Mild generalized slow activity.  - VPA level stable. 61.5 from 62.9 (up from 11.4) s/p 1gm load. Will recheck tomorrow. - Psychiatry recommending acute psychiatric hospitalization - Zyprexa 5mg  tab prn agitation due to psychosis, which she did not require last night. - BCx 9/30 NGTD (drawn after abx started)   # Bacteruria with pyuria, asymptomatic - S/p rocephin 1g and keflex x2 days. All antibiotics d/c - UCx: insignificant growth  # Elevated transaminase (AST > ALT)- Presumed NAFLD. Normal transaminases one week PTA. Abd [place] with fatty liver. Depakote is subtherapeutic, so likely not the etiology. No h/o substance abuse, EtOH negative. No abd pain on physical exam, alk phos wnl.  - Trending downward, last checked 10/1 - Ceruloplasmin 34 (normal range 20-60) - Hep panel negative - Will recheck Cmet tomorrow morning to trend  # AKI- Acute on chronic. Baseline Cr around 1.1-1.2. Most likely 2/2 dehydration. -  Cr 1.06 back to baseline.  - Monitor UOP - Renal diet  - CBC tomorrow morning  # HTN- Improving. 128/55 today - On home diltiazem - Monitor VS per floor  # Hypercalcemia- 11.4 at presentation. Ionized normal   - holding calcium supplement  - normal on recheck  # Abnormal ECG- long QT (new) and non specific t-wave abnormalities (old). Stress test in feb 2013 showed t-wave abnormalities that were felt to be non concerning at that time. Had recent ED visit for atypical chest pain where they noted the non-specific T wave changes. She was to have close follow-up following that episode.  - QTc 578 on 9/29, was normal 9/21 - No CP - Tele D/Ced for pt safety (agitation)  FEN/GI: SLIV Prophylaxis: hep sq   Disposition: Cleared after neurological work up, psych recommends acute psych hospitalization, complicated by pt's dependent status concerning ADLs. CSW working on this and has discussed with family. Pending placement.   Subjective: Patient doing well this morning with no complaints. Very polite, interactive and oriented. States she would like her coffee. Does have mild psychosis stating her sister keeps tapping on her arm although she is alone in the room.  Objective: Temp:  [98 F (36.7 C)-99 F (37.2 C)] 99 F (37.2 C) (10/04 0609) Pulse Rate:  [85-95] 85 (10/04 0609) Resp:  [18-20] 18 (10/04 0609) BP: (128-146)/(55-70) 128/55 mmHg (10/04 0609) SpO2:  [99 %-100 %] 99 % (10/04 1478) Physical Exam: Gen: 72 yo female awake in bed, NAD HEENT: MMM, EOMI, PERRL, poor dentition CV: RRR, no MRG Resp: CTABL, no wheezes noted. No stridor. Room air.  Abd: SNTND Ext: No edema noted,  full ROM. Yells out in pain with even the lightest touch of her lower extremities. When asked to not yell, I was able to exam and found no acute deformities or lesions. Good distal pulses. Neuro: Alert and oriented to person and place and month, CN II-XII grossly intact Psych: Some eye contact, pleasant affect.  Hallucinations.  Laboratory:  Recent Labs Lab 01/11/13 1915 01/12/13 0058 01/12/13 0700 01/13/13 0450  WBC 7.8  --  6.5 7.1  HGB 13.3 12.9 13.0 11.0*  HCT 38.9 38.0 38.1 32.1*  PLT 176  --  183 158    Recent Labs Lab 01/11/13 1915  01/12/13 0058 01/12/13 0700 01/13/13 0450  NA 136  --  142 141 139  K 5.4*  < > 3.8 4.1 4.2  CL 98  --  109 103 105  CO2 20  --   --  23 25  BUN 26*  --  25* 23 17  CREATININE 1.31*  --  1.30* 1.10 1.06  CALCIUM 11.4*  --   --  10.2 9.2  PROT 8.1  --   --  7.9 6.1  BILITOT 0.5  --   --  0.5 0.3  ALKPHOS 61  --   --  58 48  ALT 84*  --   --  89* 61*  AST 304*  --   --  269* 114*  GLUCOSE 119*  --  100* 81 97  < > = values in this interval not displayed.  Imaging/Diagnostic Tests: U/S ABDOMEN 01/11/13 1. Diffuse fatty infiltration of the liver. 2. Normal gallbladder and normal caliber common bowel duct. 3. Mild renal cortical thinning bilaterally but no hydronephrosis.   EEG Background: There is a well defined posterior dominant rhythm of 9 Hz that attenuates with eye opening. There are sleep structures seen which are bilateral in nature. Most of this EEG is performed during drowsiness, and therefore a mild encephlopathic state is difficult to rule out.   Photic stimulation: Physiologic driving is not performed   EEG Abnormalities: 1) Mild generalized slow activity.   Clinical Interpretation: This borderline EEG is recorded in the drowsy and sleep state. Because the patient is drowsy throughout most of the recording, it is difficult to rule out a mild non-specific encephalopathic state. There was no seizure or seizure predisposition recorded on this study.   Hilarie Fredrickson, MD 01/16/2013, 7:21 AM PGY-3, [facility] FPTS Intern pager: [phone], text pages welcome

## 2013-01-17 LAB — CBC
HCT: 32.7 % — ABNORMAL LOW (ref 36.0–46.0)
Hemoglobin: 11.1 g/dL — ABNORMAL LOW (ref 12.0–15.0)
MCH: 27.9 pg (ref 26.0–34.0)
Platelets: 174 10*3/uL (ref 150–400)
RBC: 3.98 MIL/uL (ref 3.87–5.11)
RDW: 15.6 % — ABNORMAL HIGH (ref 11.5–15.5)
WBC: 7.3 10*3/uL (ref 4.0–10.5)

## 2013-01-17 LAB — VALPROIC ACID LEVEL: Valproic Acid Lvl: 92.7 ug/mL (ref 50.0–100.0)

## 2013-01-17 LAB — COMPREHENSIVE METABOLIC PANEL
AST: 17 U/L (ref 0–37)
BUN: 18 mg/dL (ref 6–23)
CO2: 24 mEq/L (ref 19–32)
Calcium: 9.9 mg/dL (ref 8.4–10.5)
Chloride: 103 mEq/L (ref 96–112)
Creatinine, Ser: 1.06 mg/dL (ref 0.50–1.10)
GFR calc Af Amer: 59 mL/min — ABNORMAL LOW (ref >90)
GFR calc non Af Amer: 51 mL/min — ABNORMAL LOW (ref >90)
Glucose, Bld: 99 mg/dL (ref 70–99)
Total Bilirubin: 0.3 mg/dL (ref 0.3–1.2)

## 2013-01-17 NOTE — Progress Notes (Signed)
Family Medicine Teaching Service Daily Progress Note Intern Pager: [phone]  Patient name: [name] Medical record number: [id number] Date of birth: [birth year] Age: 72 y.o. Gender: female  Primary Care Provider: Lillia Abed, MD Consultants: Neurology Code Status: Full  Pt Overview and Major Events to Date:  9/30: Seizure activity witnessed.  10/1: VPA level 62.9, MRI without acute change 10/2: EEG read with global slowing, no epileptiform activity 10/3: CSW faxed to SNF and Geri-psych; awaiting placement  Assessment and Plan: [name] is a 72 y.o. female admitted with AMS for past day, now improving. PMH is significant for bipolar, schizoaffective d/o, CKD, HTN, seizure d/o.   # Psych/neuro: Seizure disorder vs. Cerebrovascular disease vs. Psychosis. - Overnight: Appears more stable and less agitated in last 24 hours. Did not require Ativan or Zyprexa prn - EEG: No seizure or seizure predisposition recorded. Mild encephlopathic state is difficult to rule out. Mild generalized slow activity.  - VPA level therapeutic 10/5: 92.7 (up from 11.4 on admission) - Psychiatry recommending acute psychiatric hospitalization - Zyprexa 5mg  tab prn agitation due to psychosis, which she did not require last night. - BCx 9/30 NGTD (drawn after abx started)   # Bacteruria with pyuria, asymptomatic - S/p rocephin 1g and keflex x2 days. All antibiotics d/c - UCx: insignificant growth  # Elevated transaminases Resolved (AST: 17. ALT: 27 10/5) - Presumed NAFLD. Normal transaminases one week PTA. Abd [place] with fatty liver. Depakote is subtherapeutic, so likely not the etiology. No h/o substance abuse, EtOH negative. No abd pain on physical exam, alk phos wnl.  - Trending downward, last checked 10/1 - Ceruloplasmin 34 (normal range 20-60) - Hep panel negative  # AKI Resolved Creatinine 1.06 10/5 (baseline: 1.1-1.2) - Renal diet   # HTN- Improving. 136/63 today - On home diltiazem -  Monitor VS per floor  # Hypercalcemia- 11.4 at presentation. Ionized normal   - holding calcium supplement  - normal on recheck  # Abnormal ECG - long QT (new) and non specific t-wave abnormalities (old). Stress test in feb 2013 showed t-wave abnormalities that were felt to be non concerning at that time. - QTc 578 on 9/29, was normal 9/21 - No CP - Tele D/Ced for pt safety (agitation)  FEN/GI: Renal diet, no IV due to pt agitation Prophylaxis: hep sq   Disposition: Cleared after neurological work up, psych recommends acute psych hospitalization, complicated by pt's dependent status concerning ADLs. CSW working on this and has discussed with family. Pending placement.   Subjective: Patient doing well this morning with no complaints. Ate 75% breakfast. Does have mild psychosis endorsing presence of sister, though no one else was in the room, and paroxysmal high-pitched screaming without incitement.   Objective: Temp:  [97.6 F (36.4 C)-98.6 F (37 C)] 97.6 F (36.4 C) (10/05 0500) Pulse Rate:  [76-90] 76 (10/05 0500) Resp:  [18-20] 18 (10/05 0500) BP: (136-147)/(58-74) 136/63 mmHg (10/05 0500) SpO2:  [97 %-99 %] 99 % (10/05 0500) Weight:  [141 lb 8.6 oz (64.2 kg)] 141 lb 8.6 oz (64.2 kg) (10/05 0500) Physical Exam: Gen: 72 yo female awake in bed, NAD HEENT: MMM, EOMI, PERRL, poor dentition CV: RRR, no MRG Resp: CTABL, no wheezes noted. No stridor. Room air.  Abd: SNTND Ext: No edema noted, full ROM. Yells out in pain with even the lightest touch of her lower extremities. When asked to not yell, I was able to exam and found no acute deformities or lesions. Good distal pulses.  Neuro: Alert and oriented to person and place and month, CN II-XII grossly intact Psych: Some eye contact, pleasant affect. Hallucinations.  Laboratory:  Recent Labs Lab 01/12/13 0700 01/13/13 0450 01/17/13 0450  WBC 6.5 7.1 7.3  HGB 13.0 11.0* 11.1*  HCT 38.1 32.1* 32.7*  PLT 183 158 174     Recent Labs Lab 01/12/13 0700 01/13/13 0450 01/17/13 0450  NA 141 139 138  K 4.1 4.2 4.0  CL 103 105 103  CO2 23 25 24   BUN 23 17 18   CREATININE 1.10 1.06 1.06  CALCIUM 10.2 9.2 9.9  PROT 7.9 6.1 6.4  BILITOT 0.5 0.3 0.3  ALKPHOS 58 48 43  ALT 89* 61* 27  AST 269* 114* 17  GLUCOSE 81 97 99    Imaging/Diagnostic Tests: U/S ABDOMEN 01/11/13 1. Diffuse fatty infiltration of the liver. 2. Normal gallbladder and normal caliber common bowel duct. 3. Mild renal cortical thinning bilaterally but no hydronephrosis.   EEG Background: There is a well defined posterior dominant rhythm of 9 Hz that attenuates with eye opening. There are sleep structures seen which are bilateral in nature. Most of this EEG is performed during drowsiness, and therefore a mild encephlopathic state is difficult to rule out.   Photic stimulation: Physiologic driving is not performed   EEG Abnormalities: 1) Mild generalized slow activity.   Clinical Interpretation: This borderline EEG is recorded in the drowsy and sleep state. Because the patient is drowsy throughout most of the recording, it is difficult to rule out a mild non-specific encephalopathic state. There was no seizure or seizure predisposition recorded on this study.   Hazeline Junker, MD 01/17/2013, 10:06 AM PGY-1, [facility] Family Medicine FPTS Intern pager: [phone], text pages welcome

## 2013-01-17 NOTE — Progress Notes (Signed)
FMTS Attending Admission Note: [name] Fishman,MD I  have seen and examined this patient, reviewed their chart. I have discussed this patient with the resident. I agree with the resident's findings, assessment and care plan.  

## 2013-01-18 DIAGNOSIS — N189 Chronic kidney disease, unspecified: Secondary | ICD-10-CM | POA: Diagnosis not present

## 2013-01-18 DIAGNOSIS — I10 Essential (primary) hypertension: Secondary | ICD-10-CM | POA: Diagnosis not present

## 2013-01-18 DIAGNOSIS — F29 Unspecified psychosis not due to a substance or known physiological condition: Secondary | ICD-10-CM | POA: Diagnosis not present

## 2013-01-18 DIAGNOSIS — I129 Hypertensive chronic kidney disease with stage 1 through stage 4 chronic kidney disease, or unspecified chronic kidney disease: Secondary | ICD-10-CM | POA: Diagnosis not present

## 2013-01-18 DIAGNOSIS — IMO0002 Reserved for concepts with insufficient information to code with codable children: Secondary | ICD-10-CM | POA: Diagnosis not present

## 2013-01-18 DIAGNOSIS — F411 Generalized anxiety disorder: Secondary | ICD-10-CM | POA: Diagnosis not present

## 2013-01-18 DIAGNOSIS — F259 Schizoaffective disorder, unspecified: Secondary | ICD-10-CM | POA: Diagnosis not present

## 2013-01-18 DIAGNOSIS — F489 Nonpsychotic mental disorder, unspecified: Secondary | ICD-10-CM | POA: Diagnosis not present

## 2013-01-18 DIAGNOSIS — Z862 Personal history of diseases of the blood and blood-forming organs and certain disorders involving the immune mechanism: Secondary | ICD-10-CM | POA: Diagnosis not present

## 2013-01-18 DIAGNOSIS — R489 Unspecified symbolic dysfunctions: Secondary | ICD-10-CM | POA: Diagnosis not present

## 2013-01-18 DIAGNOSIS — R569 Unspecified convulsions: Secondary | ICD-10-CM | POA: Diagnosis not present

## 2013-01-18 DIAGNOSIS — R4182 Altered mental status, unspecified: Secondary | ICD-10-CM | POA: Diagnosis not present

## 2013-01-18 DIAGNOSIS — R45851 Suicidal ideations: Secondary | ICD-10-CM | POA: Diagnosis not present

## 2013-01-18 DIAGNOSIS — F0390 Unspecified dementia without behavioral disturbance: Secondary | ICD-10-CM | POA: Diagnosis not present

## 2013-01-18 DIAGNOSIS — F319 Bipolar disorder, unspecified: Secondary | ICD-10-CM | POA: Diagnosis not present

## 2013-01-18 DIAGNOSIS — I150 Renovascular hypertension: Secondary | ICD-10-CM | POA: Diagnosis not present

## 2013-01-18 DIAGNOSIS — N390 Urinary tract infection, site not specified: Secondary | ICD-10-CM | POA: Diagnosis not present

## 2013-01-18 DIAGNOSIS — R450 Nervousness: Secondary | ICD-10-CM | POA: Diagnosis not present

## 2013-01-18 DIAGNOSIS — M6281 Muscle weakness (generalized): Secondary | ICD-10-CM | POA: Diagnosis not present

## 2013-01-18 DIAGNOSIS — Z79899 Other long term (current) drug therapy: Secondary | ICD-10-CM | POA: Diagnosis not present

## 2013-01-18 DIAGNOSIS — Z87440 Personal history of urinary (tract) infections: Secondary | ICD-10-CM | POA: Diagnosis not present

## 2013-01-18 DIAGNOSIS — F39 Unspecified mood [affective] disorder: Secondary | ICD-10-CM | POA: Diagnosis not present

## 2013-01-18 DIAGNOSIS — G40909 Epilepsy, unspecified, not intractable, without status epilepticus: Secondary | ICD-10-CM | POA: Diagnosis not present

## 2013-01-18 LAB — CULTURE, BLOOD (ROUTINE X 2): Culture: NO GROWTH

## 2013-01-18 MED ORDER — DIVALPROEX SODIUM ER 500 MG PO TB24: 500.0000 mg | ORAL_TABLET | Freq: Every day | ORAL | Status: DC

## 2013-01-18 NOTE — Progress Notes (Signed)
Family Medicine Teaching Service Daily Progress Note Intern Pager: [phone]  Patient name: [name] Medical record number: [id number] Date of birth: Aug 22, 1940 Age: 72 y.o. Gender: female  Primary Care Provider: Lillia Abed, MD Consultants: Neurology Code Status: Full  Pt Overview and Major Events to Date:  9/30: Seizure activity witnessed.  10/1: VPA level 62.9, MRI without acute change 10/2: EEG read with global slowing, no epileptiform activity 10/3: CSW faxed to SNF and Geri-psych; awaiting placement 10/6: Pt with marked improvement in mental status  Assessment and Plan: [name] is a 72 y.o. female admitted with AMS for past day, now improving. PMH is significant for bipolar, schizoaffective d/o, CKD, HTN, seizure d/o.   # Psych/neuro: Seizure disorder vs. Cerebrovascular disease vs. Psychosis. - Overnight: Appears more stable and less agitated in last 24 hours. Did not require Ativan or Zyprexa prn - EEG: No seizure or seizure predisposition recorded. Mild encephlopathic state is difficult to rule out. Mild generalized slow activity.  - VPA level therapeutic 10/5: 92.7 (up from 11.4 on admission) - Psychiatry recommending acute psychiatric hospitalization - Zyprexa 5mg  tab prn agitation due to psychosis, which she did not require last night. - BCx 9/30 NGTD (drawn after abx started)   # Bacteruria with pyuria, asymptomatic - S/p rocephin 1g and keflex x2 days. All antibiotics d/c - UCx: insignificant growth  # Elevated transaminases Resolved (AST: 17. ALT: 27 10/5) - Presumed NAFLD. Normal transaminases one week PTA. Abd [place] with fatty liver. Depakote is subtherapeutic, so likely not the etiology. No h/o substance abuse, EtOH negative. No abd pain on physical exam, alk phos wnl.  - Trending downward, last checked 10/1 - Ceruloplasmin 34 (normal range 20-60) - Hep panel negative  # AKI Resolved Creatinine 1.06 10/5 (baseline: 1.1-1.2) - Renal diet   # HTN-  Improving. 136/63 today - On home diltiazem - Monitor VS per floor  # Hypercalcemia- 11.4 at presentation. Ionized normal   - holding calcium supplement  - normal on recheck  # Abnormal ECG - long QT (new) and non specific t-wave abnormalities (old). Stress test in feb 2013 showed t-wave abnormalities that were felt to be non concerning at that time. - QTc 578 on 9/29, was normal 9/21 - No CP - Tele D/Ced for pt safety (agitation)  FEN/GI: Renal diet, no IV due to pt agitation Prophylaxis: hep sq   Disposition: Cleared after neurological work up, Pending placement in SNF with clearance from psychiatric consultant   Subjective: Patient doing well this morning with no complaints. Ate 100% breakfast. Does have mild psychosis endorsing presence of sister. Much more fluently conversant today.  Objective: Temp:  [98.4 F (36.9 C)-99.1 F (37.3 C)] 98.4 F (36.9 C) (10/06 0508) Pulse Rate:  [70-92] 70 (10/06 0508) Resp:  [20] 20 (10/06 0508) BP: (144-154)/(52-65) 145/52 mmHg (10/06 0508) SpO2:  [98 %-99 %] 99 % (10/06 0508) Weight:  [141 lb 8.6 oz (64.201 kg)] 141 lb 8.6 oz (64.201 kg) (10/05 2007) Physical Exam: Gen: 72 yo female awake in bed, NAD HEENT: MMM, EOMI, PERRL, poor dentition CV: RRR, no MRG Resp: CTABL, no wheezes noted. No stridor. Room air.  Abd: SNTND Ext: No edema noted, full ROM.  Neuro: Alert and oriented to person and place and time, CN II-XII grossly intact Psych: Makes good eye contact, pleasant affect.   Laboratory:  Recent Labs Lab 01/12/13 0700 01/13/13 0450 01/17/13 0450  WBC 6.5 7.1 7.3  HGB 13.0 11.0* 11.1*  HCT 38.1 32.1*  32.7*  PLT 183 158 174    Recent Labs Lab 01/12/13 0700 01/13/13 0450 01/17/13 0450  NA 141 139 138  K 4.1 4.2 4.0  CL 103 105 103  CO2 23 25 24   BUN 23 17 18   CREATININE 1.10 1.06 1.06  CALCIUM 10.2 9.2 9.9  PROT 7.9 6.1 6.4  BILITOT 0.5 0.3 0.3  ALKPHOS 58 48 43  ALT 89* 61* 27  AST 269* 114* 17  GLUCOSE  81 97 99    Imaging/Diagnostic Tests: U/S ABDOMEN 01/11/13 1. Diffuse fatty infiltration of the liver. 2. Normal gallbladder and normal caliber common bowel duct. 3. Mild renal cortical thinning bilaterally but no hydronephrosis.   EEG Background: There is a well defined posterior dominant rhythm of 9 Hz that attenuates with eye opening. There are sleep structures seen which are bilateral in nature. Most of this EEG is performed during drowsiness, and therefore a mild encephlopathic state is difficult to rule out.   Photic stimulation: Physiologic driving is not performed   EEG Abnormalities: 1) Mild generalized slow activity.   Clinical Interpretation: This borderline EEG is recorded in the drowsy and sleep state. Because the patient is drowsy throughout most of the recording, it is difficult to rule out a mild non-specific encephalopathic state. There was no seizure or seizure predisposition recorded on this study.   Hazeline Junker, MD 01/18/2013, 12:39 PM PGY-1, [facility] Family Medicine FPTS Intern pager: [phone], text pages welcome

## 2013-01-18 NOTE — Progress Notes (Signed)
Domingo Madeira to be D/C'd Skilled nursing facility per MD order.  Discussed with the patient and all questions fully answered.    Medication List         acetaminophen 325 MG tablet  Commonly known as:  TYLENOL  Take 650 mg by mouth every 6 (six) hours as needed for pain.     B-complex with vitamin C tablet  Take 1 tablet by mouth daily.     CALCIUM CITRATE + D3 PO  Take 1 tablet by mouth 2 (two) times daily.     CENTRUM SILVER ADULT 50+ PO  Take 1 tablet by mouth daily.     diltiazem 180 MG 24 hr capsule  Commonly known as:  DILACOR XR  Take 180 mg by mouth daily.     divalproex 500 MG 24 hr tablet  Commonly known as:  DEPAKOTE ER  Take 500 mg by mouth daily.     FLAXSEED OIL PO  Take 1 capsule by mouth daily.     ICY HOT 7.5 % (ROLL) Misc  Generic drug:  Menthol (Topical Analgesic)  Apply 1 each topically as needed (for pain).     magic mouthwash w/lidocaine Soln  Take 2 mLs by mouth 4 (four) times daily as needed.     OMEGA-3 FISH OIL PO  Take 325 mg by mouth 3 (three) times daily.     polyethylene glycol powder powder  Commonly known as:  GLYCOLAX/MIRALAX  Take 17 g by mouth daily as needed.     polyvinyl alcohol 1.4 % ophthalmic solution  Commonly known as:  LIQUIFILM TEARS  Place 1 drop into both eyes as needed. For dry eyes     vitamin E 400 UNIT capsule  Take 400 Units by mouth at bedtime.        VVS, Skin clean, dry and intact without evidence of skin break down, no evidence of skin tears noted.   An After Visit Summary was printed and put in yellow envelope for [facility] Patient escorted via stretcher, and D/C to [facility] via [place].  Kennyth Arnold D 01/18/2013 4:03 PM

## 2013-01-18 NOTE — Discharge Summary (Signed)
I agree with the discharge summary as documented.   Kyle Fletke MD  

## 2013-01-18 NOTE — Progress Notes (Signed)
Interim Progress Note  Discussed with Dr. Elsie Saas with psychiatry who stated patient is appropriate for SNF memory care unit given her improvement.  Will put in discharge order given patient has SNF offers per SW.  Leona Singleton, MD

## 2013-01-18 NOTE — Progress Notes (Signed)
Patient ID: [name], female   DOB: [birth year], 72 y.o.   MRN: [id number]  Patient has been followed up for psychiatric consultation at [facility] medical center. Patient has been diagnosed with dementia, cognitive deficits, confused and has been wondering around other patient rooms. She has no agitation, aggressive behaviors, suicidal or homicidal ideations. She is not required Geri psych unit at this time and Patient will be appropriate for Skilled nursing facility with memory unit as discussed with physician and staff RN.   Psychiatric consultation will sign off on her at this time May contact [phone] if we can offer further assistance  Shereta Crothers,JANARDHAHA R. 01/18/2013 3:10 PM

## 2013-01-18 NOTE — Progress Notes (Signed)
FMTS Attending Note  I personally saw and evaluated the patient. The plan of care was discussed with the resident team. I agree with the assessment and plan as documented by the resident.   Agree with discharge to SNF. Psychiatry is OK with SNF placement.   Donnella Sham MD

## 2013-01-18 NOTE — Progress Notes (Signed)
Physical Therapy Treatment Patient Details Name: ANIYA JOLICOEUR MRN: [id number] DOB: [birth year] Today's Date: 01/18/2013 Time: 6213-0865 PT Time Calculation (min): 16 min  PT Assessment / Plan / Recommendation  History of Present Illness ASHAUNA BERTHOLF is a 72 y.o. female presenting with AMS for past day. PMH is significant for bipolar, schizoaffective d/o, CKD, HTN, seizure d/o.    PT Comments   Pt making good progress with mobility.  Follow Up Recommendations  SNF     Does the patient have the potential to tolerate intense rehabilitation     Barriers to Discharge        Equipment Recommendations  None recommended by PT    Recommendations for Other Services    Frequency Min 3X/week   Progress towards PT Goals Progress towards PT goals: Progressing toward goals  Plan Current plan remains appropriate    Precautions / Restrictions Precautions Precautions: Fall   Pertinent Vitals/Pain See flow sheet.    Mobility  Transfers Sit to Stand: 4: Min assist;With upper extremity assist;With armrests;From chair/3-in-1 Stand to Sit: 4: Min assist;With upper extremity assist;With armrests;To chair/3-in-1 Details for Transfer Assistance: assist to bring hips up Ambulation/Gait Ambulation/Gait Assistance: 4: Min assist Ambulation Distance (Feet): 150 Feet Assistive device: 4-wheeled walker Ambulation/Gait Assistance Details: Verbal cues to release brakes and for steering.   Gait Pattern: Step-through pattern;Decreased stride length;Narrow base of support;Shuffle Gait velocity: decreased General Gait Details: Initial short shuffling steps but then incr step length and speed.    Exercises     PT Diagnosis:    PT Problem List:   PT Treatment Interventions:     PT Goals (current goals can now be found in the care plan section)    Visit Information  Last PT Received On: 01/18/13 Assistance Needed: +1 History of Present Illness: SHANIAH BALTES is a 72 y.o. female presenting with AMS  for past day. PMH is significant for bipolar, schizoaffective d/o, CKD, HTN, seizure d/o.     Subjective Data      Cognition  Cognition Arousal/Alertness: Awake/alert Behavior During Therapy: WFL for tasks assessed/performed Overall Cognitive Status: No family/caregiver present to determine baseline cognitive functioning    Balance  Static Standing Balance Static Standing - Balance Support: Bilateral upper extremity supported Static Standing - Level of Assistance: 4: Min assist  End of Session PT - End of Session Equipment Utilized During Treatment: Gait belt Activity Tolerance: Patient tolerated treatment well Patient left: in chair;with call bell/phone within reach;with chair alarm set Nurse Communication: Mobility status   GP     Rokia Bosket 01/18/2013, 3:32 PM  [employer] PT [phone]

## 2013-01-18 NOTE — Clinical Social Work Note (Signed)
Patient's brother is on his way to facility [facility]) to complete necessary paperwork.  Roddie Mc, [place], [place], [phone]

## 2013-01-18 NOTE — Clinical Social Work Note (Signed)
Per MD patient cleared by Psych to go to [facility]. [facility] memory unit is full but facility will give patient a "wander guard".   Roddie Mc, [place], [place], [phone]

## 2013-01-18 NOTE — Clinical Social Work Placement (Signed)
Clinical Social Work Department CLINICAL SOCIAL WORK PLACEMENT NOTE 01/18/2013  Patient:  [name], [name]  Account Number:  [id number] Admit date:  01/11/2013  Clinical Social Worker:  Lavell Luster  Date/time:  01/15/2013 03:05 PM  Clinical Social Work is seeking post-discharge placement for this patient at the following level of care:   SKILLED NURSING   (*CSW will update this form in Epic as items are completed)   01/15/2013  Patient/family provided with [facility] Department of Clinical Social Work's list of facilities offering this level of care within the geographic area requested by the patient (or if unable, by the patient's family).  01/15/2013  Patient/family informed of their freedom to choose among providers that offer the needed level of care, that participate in Medicare, Medicaid or managed care program needed by the patient, have an available bed and are willing to accept the patient.  01/15/2013  Patient/family informed of [facility]' ownership interest in [facility], as well as of the fact that they are under no obligation to receive care at this facility.  PASARR submitted to EDS on 01/15/2013 PASARR number received from EDS on 01/15/2013  FL2 transmitted to all facilities in geographic area requested by pt/family on  01/15/2013 FL2 transmitted to all facilities within larger geographic area on 01/15/2013  Patient informed that his/her managed care company has contracts with or will negotiate with  certain facilities, including the following:     Patient/family informed of bed offers received:  01/18/2013 Patient chooses bed at [facility] Physician recommends and patient chooses bed at    Patient to be transferred to [facility] on  01/18/2013 Patient to be transferred to facility by ambulance  The following physician request were entered in Epic:   Additional Comments: Per MD patient ready for DC to  [facility] 01/18/13. Per MD patient has been cleared by Psych for admission to SNF. MD would prefer that patient DC to SNF with memory unit due to wandering behaviors, but facility will only be able to provide a "wander guard" for patient as their memory unit is full. Patient's brother complete paperwork at facility. RN has called transport to [facility]. Packet left on patient's chart. CSW signing off at this time.   Roddie Mc, [place], [place], [phone]

## 2013-01-25 ENCOUNTER — Emergency Department (HOSPITAL_COMMUNITY)
Admission: EM | Admit: 2013-01-25 | Discharge: 2013-01-28 | Payer: Medicare Other | Attending: Emergency Medicine | Admitting: Emergency Medicine

## 2013-01-25 ENCOUNTER — Non-Acute Institutional Stay (SKILLED_NURSING_FACILITY): Payer: Medicare Other | Admitting: Internal Medicine

## 2013-01-25 ENCOUNTER — Encounter (HOSPITAL_COMMUNITY): Payer: Self-pay | Admitting: Emergency Medicine

## 2013-01-25 DIAGNOSIS — F411 Generalized anxiety disorder: Secondary | ICD-10-CM | POA: Diagnosis not present

## 2013-01-25 DIAGNOSIS — F489 Nonpsychotic mental disorder, unspecified: Secondary | ICD-10-CM | POA: Diagnosis not present

## 2013-01-25 DIAGNOSIS — Z862 Personal history of diseases of the blood and blood-forming organs and certain disorders involving the immune mechanism: Secondary | ICD-10-CM | POA: Insufficient documentation

## 2013-01-25 DIAGNOSIS — F250 Schizoaffective disorder, bipolar type: Secondary | ICD-10-CM

## 2013-01-25 DIAGNOSIS — N189 Chronic kidney disease, unspecified: Secondary | ICD-10-CM | POA: Insufficient documentation

## 2013-01-25 DIAGNOSIS — I129 Hypertensive chronic kidney disease with stage 1 through stage 4 chronic kidney disease, or unspecified chronic kidney disease: Secondary | ICD-10-CM | POA: Insufficient documentation

## 2013-01-25 DIAGNOSIS — IMO0002 Reserved for concepts with insufficient information to code with codable children: Secondary | ICD-10-CM | POA: Insufficient documentation

## 2013-01-25 DIAGNOSIS — Z8639 Personal history of other endocrine, nutritional and metabolic disease: Secondary | ICD-10-CM | POA: Insufficient documentation

## 2013-01-25 DIAGNOSIS — R450 Nervousness: Secondary | ICD-10-CM | POA: Insufficient documentation

## 2013-01-25 DIAGNOSIS — F319 Bipolar disorder, unspecified: Secondary | ICD-10-CM | POA: Diagnosis not present

## 2013-01-25 DIAGNOSIS — F259 Schizoaffective disorder, unspecified: Secondary | ICD-10-CM

## 2013-01-25 DIAGNOSIS — F0390 Unspecified dementia without behavioral disturbance: Secondary | ICD-10-CM | POA: Diagnosis not present

## 2013-01-25 DIAGNOSIS — R45851 Suicidal ideations: Secondary | ICD-10-CM | POA: Insufficient documentation

## 2013-01-25 DIAGNOSIS — Z01818 Encounter for other preprocedural examination: Secondary | ICD-10-CM | POA: Diagnosis not present

## 2013-01-25 DIAGNOSIS — I10 Essential (primary) hypertension: Secondary | ICD-10-CM | POA: Diagnosis not present

## 2013-01-25 DIAGNOSIS — R569 Unspecified convulsions: Secondary | ICD-10-CM | POA: Diagnosis not present

## 2013-01-25 DIAGNOSIS — G40909 Epilepsy, unspecified, not intractable, without status epilepticus: Secondary | ICD-10-CM | POA: Insufficient documentation

## 2013-01-25 DIAGNOSIS — I150 Renovascular hypertension: Secondary | ICD-10-CM

## 2013-01-25 DIAGNOSIS — R451 Restlessness and agitation: Secondary | ICD-10-CM

## 2013-01-25 DIAGNOSIS — Z79899 Other long term (current) drug therapy: Secondary | ICD-10-CM | POA: Insufficient documentation

## 2013-01-25 LAB — CBC
Hemoglobin: 10.2 g/dL — ABNORMAL LOW (ref 12.0–15.0)
MCH: 27.5 pg (ref 26.0–34.0)
MCHC: 32.7 g/dL (ref 30.0–36.0)
Platelets: 247 10*3/uL (ref 150–400)
RBC: 3.71 MIL/uL — ABNORMAL LOW (ref 3.87–5.11)

## 2013-01-25 LAB — SALICYLATE LEVEL: Salicylate Lvl: <2 mg/dL — ABNORMAL LOW (ref 2.8–20.0)

## 2013-01-25 LAB — COMPREHENSIVE METABOLIC PANEL
ALT: 15 U/L (ref 0–35)
AST: 20 U/L (ref 0–37)
Albumin: 3.1 g/dL — ABNORMAL LOW (ref 3.5–5.2)
CO2: 24 mEq/L (ref 19–32)
Calcium: 10.3 mg/dL (ref 8.4–10.5)
Creatinine, Ser: 1.14 mg/dL — ABNORMAL HIGH (ref 0.50–1.10)
Potassium: 3.6 mEq/L (ref 3.5–5.1)
Sodium: 137 mEq/L (ref 135–145)
Total Protein: 6.9 g/dL (ref 6.0–8.3)

## 2013-01-25 LAB — ACETAMINOPHEN LEVEL: Acetaminophen (Tylenol), Serum: <15 ug/mL (ref 10–30)

## 2013-01-25 NOTE — BH Assessment (Signed)
[facility] Assessment Progress Note      Msg given to [facility] ED secretary to notify that Shelda Jakes [facility] PA has been notified of Ms Yager need for telepsych and will be seeing her shortly.

## 2013-01-25 NOTE — ED Notes (Addendum)
Pt brought in my [employer], IVC papers. Pt friendly and nice to women, reports she doesn't like men. Pt psychotic, reports she "has a string from her pelvis to her mouth. Pt prays in tongues. Pt walks with a walker. Pt wears pull up/diaper. In walker pt has pull ups, tooth brush, fixodent, tooth brush.  Pt taken to the bathroom, unable to urinate.  All other belongings in locker #33.

## 2013-01-25 NOTE — BH Assessment (Signed)
[facility] Assessment Progress Note   Dr Leo Rod called in request from [facility] for tele psych consult with PA/exptender for Clothilde Frede.  Recommendations for medication and discharge to patient's current nursing home would be ideal is deemed appropriate.  Carney Bern, LCSW

## 2013-01-25 NOTE — ED Notes (Signed)
Assumed care of patient Patient resting with eyes closed RR WNL--even and unlabored with equal rise and fall of chest Patient appears in NAD Side rails up, call bell in reach

## 2013-01-25 NOTE — Progress Notes (Addendum)
CSW was consulted by EDP [place] concerning pt.  Dr. Leo Rod asked CSW to contact [facility] facility [phone] to gather collateral information on concerns of facility that caused them IVC pt and to send pt to ED.  CSW spoke with Lajoyce Corners Persons, Production designer, theatre/television/film on duty. CSW discussed with her that EDP wanted additional information and that patient would have a psychiatric consult for recommendations, but would likely be discharged back to the facility if the result of the consult indicated that she was psychiatrically cleared and that she is at her baseline.   Ms. Persons transferred CSW to pts nurse, Richie [place].  CSW spoke with pts  Nurse.  Mr.  Barnabas Lister reported that pt was having significant behavioral problems which included cursing and yelling at staff, screaming through the night, wandering around the building, taking off her diaper in the hallway, and refusing medication and care.  Mr. Barnabas Lister reported that pt has tried to get out of the building.  Mr. Barnabas Lister also reports that pt has not physically hurt anyone or herself.  Mr. Barnabas Lister reported that the pt has ripped a phone off the wall. Mr. Barnabas Lister reports that the only thing that seems to help calm patient his having her brother at her side.  Mr. Barnabas Lister confirmed that there were no pending charges concerning pt.  CSW discussed with Mr. Barnabas Lister as well that pt might be returning to the facility pending the results of the psychiatric consult.  Marva Panda, Theresia Majors  [phone]  .01/25/2013

## 2013-01-25 NOTE — ED Provider Notes (Signed)
CSN: [id number]     Arrival date & time 01/25/13  1717 History   First MD Initiated Contact with Patient 01/25/13 1738     Chief Complaint  Patient presents with  . Medical Clearance   (Consider location/radiation/quality/duration/timing/severity/associated sxs/prior Treatment) The history is provided by the patient and the police. The history is limited by the condition of the patient.  [facility] from [facility], hx schizoaffective disorder, bipolar disorder, recently d/cd from hospital with behavioral issues, d/c to [facility] 10/6.  Pt arrives to ed with commitment papers via police from [facility], stating pt has been refusing her meds, her behavioral symptoms have persisted, difficult for staff to handle, exhibiting disagreeable behavior with staff and residents.  Pt denies thoughts of harming either staff, residents or herself. Denies being depressed, but states is upset because she doesn't like the facility, and feels they are not attentive enough to her needs. Pt v difficult historian, level 5 caveat, behavioral issues.    Past Medical History  Diagnosis Date  . Depression   . Gout   . Bipolar 1 disorder   . Schizoaffective disorder   . Chronic kidney disease   . Hypertension   . Seizure disorder   . Thrombocytopenia   . Anemia    Past Surgical History  Procedure Laterality Date  . Tubal ligation    . Appendectomy     History reviewed. No pertinent family history. History  Substance Use Topics  . Smoking status: Never Smoker   . Smokeless tobacco: Never Used  . Alcohol Use: No   OB History   Grav Para Term Preterm Abortions TAB SAB Ect Mult Living                 Review of Systems  Constitutional: Negative for fever and chills.  Eyes: Negative for redness.  Respiratory: Negative for shortness of breath.   Cardiovascular: Negative for chest pain.  Gastrointestinal: Negative for abdominal pain.  Genitourinary: Negative for flank pain.  Musculoskeletal: Negative for back pain and neck  pain.  Skin: Negative for rash.  Neurological: Negative for headaches.  Hematological: Does not bruise/bleed easily.  Psychiatric/Behavioral: Positive for behavioral problems. The patient is nervous/anxious.     Allergies  Ativan; Benztropine mesylate; Clonazepam; Vicodin; Lisinopril; and Sulfonamide derivatives  Home Medications   Current Outpatient Rx  Name  Route  Sig  Dispense  Refill  . acetaminophen (TYLENOL) 325 MG tablet   Oral   Take 650 mg by mouth every 6 (six) hours as needed for pain.         [name] Alum & Mag Hydroxide-Simeth (MAGIC MOUTHWASH W/LIDOCAINE) SOLN   Oral   Take 2 mLs by mouth 4 (four) times daily as needed.   100 mL   0   . B Complex-C (B-COMPLEX WITH VITAMIN C) tablet   Oral   Take 1 tablet by mouth daily.         . Calcium Citrate-Vitamin D (CALCIUM CITRATE + D3 PO)   Oral   Take 1 tablet by mouth 2 (two) times daily.         [name] diltiazem (DILACOR XR) 180 MG 24 hr capsule   Oral   Take 180 mg by mouth daily.         . divalproex (DEPAKOTE ER) 500 MG 24 hr tablet   Oral   Take 1 tablet (500 mg total) by mouth daily.   30 tablet   0   . Flaxseed, Linseed, (FLAXSEED OIL PO)  Oral   Take 1 capsule by mouth daily.         . Menthol, Topical Analgesic, (ICY HOT) 7.5 % (ROLL) MISC   Apply externally   Apply 1 each topically as needed (for pain).         . Multiple Vitamins-Minerals (CENTRUM SILVER ADULT 50+ PO)   Oral   Take 1 tablet by mouth daily.         . Omega-3 Fatty Acids (OMEGA-3 FISH OIL PO)   Oral   Take 325 mg by mouth 3 (three) times daily.         . polyethylene glycol powder (GLYCOLAX/MIRALAX) powder   Oral   Take 17 g by mouth daily as needed.   3350 g   1   . polyvinyl alcohol (LIQUIFILM TEARS) 1.4 % ophthalmic solution   Both Eyes   Place 1 drop into both eyes as needed. For dry eyes         . vitamin E 400 UNIT capsule   Oral   Take 400 Units by mouth at bedtime.           BP 135/65  Pulse  94  Temp(Src) 98.4 F (36.9 C) (Oral)  Resp 16  SpO2 100% Physical Exam  Nursing note and vitals reviewed. Constitutional: She appears well-developed and well-nourished. No distress.  HENT:  Head: Atraumatic.  Mouth/Throat: Oropharynx is clear and moist.  Eyes: Conjunctivae are normal. Pupils are equal, round, and reactive to light. No scleral icterus.  Neck: Neck supple. No tracheal deviation present.  Cardiovascular: Normal rate, regular rhythm, normal heart sounds and intact distal pulses.   Pulmonary/Chest: Effort normal and breath sounds normal. No respiratory distress.  Abdominal: Soft. Normal appearance. She exhibits no distension. There is no tenderness.  Genitourinary:  No cva tenderness  Musculoskeletal: She exhibits no edema.  Neurological: She is alert.  Alert, oriented to person/place. Motor intact bil. Follows commands. Ambulates w steady gait.   Skin: Skin is warm and dry. No rash noted. She is not diaphoretic.  Psychiatric:  Anxious, upset at times, calms down quickly.     ED Course  Procedures (including critical care time) Labs Review Results for orders placed during the hospital encounter of 01/25/13  ACETAMINOPHEN LEVEL      Result Value Range   Acetaminophen (Tylenol), Serum <15.0  10 - 30 ug/mL  CBC      Result Value Range   WBC 6.3  4.0 - 10.5 K/uL   RBC 3.71 (*) 3.87 - 5.11 MIL/uL   Hemoglobin 10.2 (*) 12.0 - 15.0 g/dL   HCT 16.1 (*) 09.6 - 04.5 %   MCV 84.1  78.0 - 100.0 fL   MCH 27.5  26.0 - 34.0 pg   MCHC 32.7  30.0 - 36.0 g/dL   RDW 40.9 (*) 81.1 - 91.4 %   Platelets 247  150 - 400 K/uL  COMPREHENSIVE METABOLIC PANEL      Result Value Range   Sodium 137  135 - 145 mEq/L   Potassium 3.6  3.5 - 5.1 mEq/L   Chloride 103  96 - 112 mEq/L   CO2 24  19 - 32 mEq/L   Glucose, Bld 112 (*) 70 - 99 mg/dL   BUN 14  6 - 23 mg/dL   Creatinine, Ser 7.82 (*) 0.50 - 1.10 mg/dL   Calcium 95.6  8.4 - 21.3 mg/dL   Total Protein 6.9  6.0 - 8.3 g/dL    Albumin 3.1 (*)  3.5 - 5.2 g/dL   AST 20  0 - 37 U/L   ALT 15  0 - 35 U/L   Alkaline Phosphatase 51  39 - 117 U/L   Total Bilirubin 0.2 (*) 0.3 - 1.2 mg/dL   GFR calc non Af Amer 47 (*) >90 mL/min   GFR calc Af Amer 54 (*) >90 mL/min  ETHANOL      Result Value Range   Alcohol, Ethyl (B) <11  0 - 11 mg/dL  SALICYLATE LEVEL      Result Value Range   Salicylate Lvl <2.0 (*) 2.8 - 20.0 mg/dL       EKG Interpretation   None       MDM  Labs.   Reviewed nursing notes and prior charts for additional history.   Social work consulted.  SW evaluated situation and spoke with [facility] staff - they state issues behavioral in nature, and unchanged from their eval of patient during recent hospitalization.  They indicate at [facility] pt has not harmed any patient or staff, no charges filed or pending.  Will get telepsych eval, including possible recommendation meds that may help behavioral issues.  Signed out to Dr Ranae Palms to follow up with telepsych eval - pt possibly able to be d/c'd back to current [facility] post telepsych eval.       Suzi Roots, MD 01/25/13 2333

## 2013-01-26 ENCOUNTER — Emergency Department (HOSPITAL_COMMUNITY): Payer: Medicare Other

## 2013-01-26 DIAGNOSIS — Z01818 Encounter for other preprocedural examination: Secondary | ICD-10-CM | POA: Diagnosis not present

## 2013-01-26 DIAGNOSIS — F259 Schizoaffective disorder, unspecified: Secondary | ICD-10-CM | POA: Diagnosis not present

## 2013-01-26 DIAGNOSIS — I10 Essential (primary) hypertension: Secondary | ICD-10-CM | POA: Diagnosis not present

## 2013-01-26 LAB — URINE MICROSCOPIC-ADD ON

## 2013-01-26 LAB — URINALYSIS, ROUTINE W REFLEX MICROSCOPIC
Bilirubin Urine: NEGATIVE
Bilirubin Urine: NEGATIVE
Glucose, UA: NEGATIVE mg/dL
Ketones, ur: NEGATIVE mg/dL
Ketones, ur: NEGATIVE mg/dL
Nitrite: NEGATIVE
Nitrite: NEGATIVE
Protein, ur: NEGATIVE mg/dL
Specific Gravity, Urine: 1.016 (ref 1.005–1.030)
Urobilinogen, UA: 0.2 mg/dL (ref 0.0–1.0)
Urobilinogen, UA: 0.2 mg/dL (ref 0.0–1.0)
pH: 6 (ref 5.0–8.0)
pH: 7 (ref 5.0–8.0)

## 2013-01-26 LAB — RAPID URINE DRUG SCREEN, HOSP PERFORMED
Amphetamines: NOT DETECTED
Barbiturates: NOT DETECTED
Benzodiazepines: NOT DETECTED
Cocaine: NOT DETECTED
Tetrahydrocannabinol: NOT DETECTED

## 2013-01-26 MED ORDER — DIPHENHYDRAMINE HCL 50 MG/ML IJ SOLN
25.0000 mg | Freq: Once | INTRAMUSCULAR | Status: AC
  Administered 2013-01-26: 25 mg via INTRAMUSCULAR

## 2013-01-26 MED ORDER — ACETAMINOPHEN 325 MG PO TABS: 650.0000 mg | ORAL_TABLET | Freq: Four times a day (QID) | ORAL | Status: DC | PRN

## 2013-01-26 MED ORDER — DIVALPROEX SODIUM ER 500 MG PO TB24
500.0000 mg | ORAL_TABLET | Freq: Every day | ORAL | Status: DC
  Administered 2013-01-26 – 2013-01-27 (×2): 500 mg via ORAL
  Filled 2013-01-26 (×4): qty 1

## 2013-01-26 MED ORDER — DIPHENHYDRAMINE HCL 50 MG/ML IJ SOLN
INTRAMUSCULAR | Status: AC
  Filled 2013-01-26: qty 1

## 2013-01-26 MED ORDER — DILTIAZEM HCL ER 180 MG PO CP24
180.0000 mg | ORAL_CAPSULE | Freq: Every day | ORAL | Status: DC
  Administered 2013-01-26 – 2013-01-27 (×2): 180 mg via ORAL
  Filled 2013-01-26 (×4): qty 1

## 2013-01-26 MED ORDER — OLANZAPINE 5 MG PO TBDP
5.0000 mg | ORAL_TABLET | Freq: Once | ORAL | Status: AC
  Administered 2013-01-26: 5 mg via ORAL
  Filled 2013-01-26: qty 1

## 2013-01-26 MED ORDER — ZIPRASIDONE MESYLATE 20 MG IM SOLR
10.0000 mg | Freq: Once | INTRAMUSCULAR | Status: AC
  Administered 2013-01-26: 10 mg via INTRAMUSCULAR
  Filled 2013-01-26: qty 20

## 2013-01-26 NOTE — Progress Notes (Addendum)
CSW followed up with [facility].  Per Victorino Dike, pt information still pending review. CSW followed up with [facility].  Per, [place] Ana pt is still pending.  She will have SW [place] call CSW tomorrow morning. CSW followed up with [facility].  Per Lloyd Huger he did not have a referral for this pt, but currently does not have any geri-psych beds available   .Marva Panda, LCSWA  [phone]  .01/26/2013 8:55 pm

## 2013-01-26 NOTE — ED Notes (Signed)
Patient becoming increasingly agitated and restless MD made aware

## 2013-01-26 NOTE — ED Notes (Signed)
Patient's behavior remains labile Patient will have brief periods of rest/sleep, then wake up and begin yelling, screaming, and cursing MD made aware  VO placed

## 2013-01-26 NOTE — ED Notes (Signed)
Dr Patria Mane re eval pt for need to continue restrained.  Pt currently still physically and verbally aggressive and threatening staff.

## 2013-01-26 NOTE — BHH Counselor (Signed)
TTS has referred the patient to [facility], [facility] [place], [place].  Writer informed the working with the patient [place])  that the patient has been referred to the following hospitals.

## 2013-01-26 NOTE — ED Notes (Signed)
Patient resting in recliner chair with eyes closed s/p admin of medication, see MAR RR WNL--even and unlabored with equal rise and fall of chest Patient appears in NAD Patient within easy view of the nursing station, door open Will continue to monitor

## 2013-01-26 NOTE — ED Notes (Signed)
Pt verbalizes understanding 

## 2013-01-26 NOTE — Consult Note (Signed)
Telepsych Consultation   Reason for Consult:  Medication recommendations for discharge Referring Physician:  [name] is an 72 y.o. female.  Assessment: AXIS I:  Schizoaffective Disorder AXIS II:  Deferred AXIS III:   Past Medical History  Diagnosis Date  . Depression   . Gout   . Bipolar 1 disorder   . Schizoaffective disorder   . Chronic kidney disease   . Hypertension   . Seizure disorder   . Thrombocytopenia   . Anemia    AXIS IV:  problems related to social environment AXIS V:  31-40 impairment in reality testing  Plan:  Recommend admission to a Geriatric Psychiatric facility when medically stable.  Subjective:   [name] is a 72 y.o. female patient admitted with behavioral changes.  HPI:  This provider attempted to do a telepsych evaluation on [name] a 72 year old AA female admitted to the ED in company of the [employer] for behavioral changes since returning to the [facility]       [name] is cursing, appears to be responding to internal stimulation, her speech is bizarre an disconnected. She will respond to questions incoherently with cursing and threats. She makes minimal eye contact with the camera, and alternately raises her right leg in the air at a 45 degree angle. She reaches for things that arent there, pats the side of the bed rhythmically and touches herself inappropriately during the assessment.       As she is unable to cooperate the assessment is terminated. HPI Elements:   Location:  [facility]. Quality:  acute. Severity:  moderate to severe. Timing:  unclear. Duration:  since re-admission to hospital from [facility]. Context:  Behavioral changes.  Past Psychiatric History: Past Medical History  Diagnosis Date  . Depression   . Gout   . Bipolar 1 disorder   . Schizoaffective disorder   . Chronic kidney disease   . Hypertension   . Seizure disorder   . Thrombocytopenia   . Anemia     reports that she has never smoked. She has never used  smokeless tobacco. She reports that she does not drink alcohol or use illicit drugs. History reviewed. No pertinent family history.       Allergies:   Allergies  Allergen Reactions  . Ativan [Lorazepam] Other (See Comments)    Increases fall risk and was told not to take it.  . Benztropine Mesylate Other (See Comments)    Restless legs  . Clonazepam Other (See Comments)    Increases fall risk and was told not to take it.  . Vicodin [Hydrocodone-Acetaminophen] Other (See Comments)    Increases fall risk and was told not to take it.  . Lisinopril Other (See Comments)    cough  . Sulfonamide Derivatives Other (See Comments)    Pt does not remember an allergic reaction to sulfa drugs    ACT Assessment Complete:  Yes:    Educational Status    Risk to Self: Risk to self Substance abuse history and/or treatment for substance abuse?: No  Risk to Others:    Abuse:    Prior Inpatient Therapy:    Prior Outpatient Therapy:    Additional Information:    Objective: Blood pressure 135/65, pulse 94, temperature 98.4 F (36.9 C), temperature source Oral, resp. rate 16, SpO2 100.00%.There is no weight on file to calculate BMI. Results for orders placed during the hospital encounter of 01/25/13 (from the past 72 hour(s))  ACETAMINOPHEN LEVEL  Status: None   Collection Time    01/25/13  5:48 PM      Result Value Range   Acetaminophen (Tylenol), Serum <15.0  10 - 30 ug/mL   Comment:            THERAPEUTIC CONCENTRATIONS VARY     SIGNIFICANTLY. A RANGE OF 10-30     ug/mL MAY BE AN EFFECTIVE     CONCENTRATION FOR MANY PATIENTS.     HOWEVER, SOME ARE BEST TREATED     AT CONCENTRATIONS OUTSIDE THIS     RANGE.     ACETAMINOPHEN CONCENTRATIONS     >150 ug/mL AT 4 HOURS AFTER     INGESTION AND >50 ug/mL AT 12     HOURS AFTER INGESTION ARE     OFTEN ASSOCIATED WITH TOXIC     REACTIONS.  CBC     Status: Abnormal   Collection Time    01/25/13  5:48 PM      Result Value Range   WBC 6.3   4.0 - 10.5 K/uL   RBC 3.71 (*) 3.87 - 5.11 MIL/uL   Hemoglobin 10.2 (*) 12.0 - 15.0 g/dL   HCT 40.9 (*) 81.1 - 91.4 %   MCV 84.1  78.0 - 100.0 fL   MCH 27.5  26.0 - 34.0 pg   MCHC 32.7  30.0 - 36.0 g/dL   RDW 78.2 (*) 95.6 - 21.3 %   Platelets 247  150 - 400 K/uL  COMPREHENSIVE METABOLIC PANEL     Status: Abnormal   Collection Time    01/25/13  5:48 PM      Result Value Range   Sodium 137  135 - 145 mEq/L   Potassium 3.6  3.5 - 5.1 mEq/L   Chloride 103  96 - 112 mEq/L   CO2 24  19 - 32 mEq/L   Glucose, Bld 112 (*) 70 - 99 mg/dL   BUN 14  6 - 23 mg/dL   Creatinine, Ser 0.86 (*) 0.50 - 1.10 mg/dL   Calcium 57.8  8.4 - 46.9 mg/dL   Total Protein 6.9  6.0 - 8.3 g/dL   Albumin 3.1 (*) 3.5 - 5.2 g/dL   AST 20  0 - 37 U/L   ALT 15  0 - 35 U/L   Alkaline Phosphatase 51  39 - 117 U/L   Total Bilirubin 0.2 (*) 0.3 - 1.2 mg/dL   GFR calc non Af Amer 47 (*) >90 mL/min   GFR calc Af Amer 54 (*) >90 mL/min   Comment: (NOTE)     The eGFR has been calculated using the CKD EPI equation.     This calculation has not been validated in all clinical situations.     eGFR's persistently <90 mL/min signify possible Chronic Kidney     Disease.  ETHANOL     Status: None   Collection Time    01/25/13  5:48 PM      Result Value Range   Alcohol, Ethyl (B) <11  0 - 11 mg/dL   Comment:            LOWEST DETECTABLE LIMIT FOR     SERUM ALCOHOL IS 11 mg/dL     FOR MEDICAL PURPOSES ONLY  SALICYLATE LEVEL     Status: Abnormal   Collection Time    01/25/13  5:48 PM      Result Value Range   Salicylate Lvl <2.0 (*) 2.8 - 20.0 mg/dL  VALPROIC ACID LEVEL  Status: None   Collection Time    01/25/13  5:48 PM      Result Value Range   Valproic Acid Lvl 80.9  50.0 - 100.0 ug/mL   Comment: Performed at [facility] are reviewed and are pertinent for no current urinalysis, CKD, Chronic anemia, non-specific ST changes, normal brain MRI, and therapeutic Depakote level.  Current  Facility-Administered Medications  Medication Dose Route Frequency Provider Last Rate Last Dose  . acetaminophen (TYLENOL) tablet 650 mg  650 mg Oral Q6H PRN Suzi Roots, MD      . diltiazem (DILACOR XR) 24 hr capsule 180 mg  180 mg Oral Daily Suzi Roots, MD      . divalproex (DEPAKOTE ER) 24 hr tablet 500 mg  500 mg Oral Daily Suzi Roots, MD       Current Outpatient Prescriptions  Medication Sig Dispense Refill  . acetaminophen (TYLENOL) 325 MG tablet Take 650 mg by mouth every 6 (six) hours as needed for pain.      [name] Alum & Mag Hydroxide-Simeth (MAGIC MOUTHWASH W/LIDOCAINE) SOLN Take 2 mLs by mouth 4 (four) times daily as needed.  100 mL  0  . B Complex-C (B-COMPLEX WITH VITAMIN C) tablet Take 1 tablet by mouth daily.      . Calcium Citrate-Vitamin D (CALCIUM CITRATE + D3 PO) Take 1 tablet by mouth 2 (two) times daily.      [name] diltiazem (DILACOR XR) 180 MG 24 hr capsule Take 180 mg by mouth daily.      . divalproex (DEPAKOTE ER) 500 MG 24 hr tablet Take 1 tablet (500 mg total) by mouth daily.  30 tablet  0  . Flaxseed, Linseed, (FLAXSEED OIL PO) Take 1 capsule by mouth daily.      . Menthol, Topical Analgesic, (ICY HOT) 7.5 % (ROLL) MISC Apply 1 each topically as needed (for pain).      . Multiple Vitamins-Minerals (CENTRUM SILVER ADULT 50+ PO) Take 1 tablet by mouth daily.      . Omega-3 Fatty Acids (OMEGA-3 FISH OIL PO) Take 325 mg by mouth 3 (three) times daily.      . polyethylene glycol powder (GLYCOLAX/MIRALAX) powder Take 17 g by mouth daily as needed.  3350 g  1  . polyvinyl alcohol (LIQUIFILM TEARS) 1.4 % ophthalmic solution Place 1 drop into both eyes as needed. For dry eyes      . vitamin E 400 UNIT capsule Take 400 Units by mouth at bedtime.         Psychiatric Specialty Exam:     Blood pressure 135/65, pulse 94, temperature 98.4 F (36.9 C), temperature source Oral, resp. rate 16, SpO2 100.00%.There is no weight on file to calculate BMI.  General Appearance:  Bizarre  Eye Contact::  Poor  Speech:  Garbled  Volume:  Increased  Mood:  Angry  Affect:  Inappropriate  Thought Process:  Disorganized  Orientation:  UTA  Thought Content:  NA  Suicidal Thoughts:  UTA  Homicidal Thoughts:  UTA  Memory:  UTA  Judgement:  Impaired  Insight:  Lacking  Psychomotor Activity:  Increased  Concentration:  NA  Recall:  NA  Akathisia:  No  Handed:    AIMS (if indicated):     Assets:    Sleep:      Treatment Plan Summary: Medication Management 1. Consider use of Ativan 1-2 mgs q 6 hours. Documentation states she is a fall risk while on this  medication, but does not preclude use of this due to allergic response. 2. Consider Vistaril 25-50mg  po q 6 hours for agitation. 3. Most 1st and 2nd generation antipsychotics have black box warnings with most deaths related to infectious causes and cardiovascular causes. The causality of the antipsychotic in relationship to death of the elderly is not clear. Recommend a pharmacy consult for further evaluation. 4. 1:1 for both fall risk as well as wandering precautions. 5. Recommend in patient stabilization at [facility] facility such as [facility].  This patient is not an appropriate admission for [facility] due to her her age, inability to perform her own ADLS, inability to program, and behavioral issues.  Disposition:   Lloyd Huger T. Mashburn RPAC 2:06 AM 01/26/2013  I agreed with the findings, treatment and disposition plan of this patient. Kathryne Sharper, MD

## 2013-01-26 NOTE — ED Notes (Signed)
Tele-psych completed at bedside.

## 2013-01-26 NOTE — ED Provider Notes (Signed)
ECG interpretation   Date: 01/26/2013  Rate: 98  Rhythm: normal sinus rhythm  QRS Axis: normal  Intervals: normal  ST/T Wave abnormalities: Nonspecific ST and T wave changes  Conduction Disutrbances: none  Narrative Interpretation:   Old EKG Reviewed: No significant changes noted     Lyanne Co, MD 01/26/13 1001

## 2013-01-26 NOTE — Progress Notes (Signed)
   CARE MANAGEMENT ED NOTE 01/26/2013  Patient:  [name], [name]   Account Number:  [id number]  Date Initiated:  01/26/2013  Documentation initiated by:  Edd Arbour  Subjective/Objective Assessment:   72 yr old female from [facility] medicare/medicaid pt  brought in by [employer] for behavioral changes since returning to [facility]- cursing, appears to be responding to internal stimulation, her speech is bizarre an disconnected.     Subjective/Objective Assessment Detail:   pcp dr Aviva Signs DE LA PAZ     Action/Plan:   Pt discussed in [facility] with recommendations: Namenda 20 mg bid depakote 250 mg bid and seroquel CM discussed these recommendations with Dr Claudius Sis and Dr Patria Mane   Action/Plan Detail:   Anticipated DC Date:       Status Recommendation to Physician:   Result of Recommendation:    Other ED Services  Consult Working Plan    DC Planning Services  Other  Outpatient Services - Pt will follow up    Choice offered to / List presented to:            Status of service:  Completed, signed off  ED Comments:   ED Comments Detail:

## 2013-01-26 NOTE — ED Notes (Signed)
Pt screams profanity about black people. Says things that do not make sense. Able to walk with 2 person assist or with walker. Pt at present does not want to use walker. Pt at present likes the tech that is helping. Says the tech is her nurse and the nurse is the assitant.

## 2013-01-26 NOTE — Consult Note (Signed)
Telepsych Consultation   Reason for Consult:  Medication recommendations for discharge Referring Physician:  [name] is an 73 y.o. female.  Assessment: AXIS I:  Schizoaffective Disorder AXIS II:  Deferred AXIS III:   Past Medical History  Diagnosis Date  . Depression   . Gout   . Bipolar 1 disorder   . Schizoaffective disorder   . Chronic kidney disease   . Hypertension   . Seizure disorder   . Thrombocytopenia   . Anemia    AXIS IV:  problems related to social environment AXIS V:  31-40 impairment in reality testing  Plan:  Recommend admission to a Geriatric Psychiatric facility when medically stable.  Subjective:   [name] is a 72 y.o. female patient admitted with behavioral changes.  HPI:  This provider attempted to do a telepsych evaluation on Ms. Been a 72 year old AA female admitted to the ED in company of the [employer] for behavioral changes since returning to the [facility]       [name] is cursing, appears to be responding to internal stimulation, her speech is bizarre an disconnected. She will respond to questions incoherently with cursing and threats. She makes minimal eye contact with the camera, and alternately raises her right leg in the air at a 45 degree angle. She reaches for things that arent there, pats the side of the bed rhythmically and touches herself inappropriately during the assessment.       As she is unable to cooperate the assessment is terminated. HPI Elements:   Location:  [facility]. Quality:  acute. Severity:  moderate to severe. Timing:  unclear. Duration:  since re-admission to hospital from ECF. Context:  Behavioral changes.  Past Psychiatric History: Past Medical History  Diagnosis Date  . Depression   . Gout   . Bipolar 1 disorder   . Schizoaffective disorder   . Chronic kidney disease   . Hypertension   . Seizure disorder   . Thrombocytopenia   . Anemia     reports that she has never smoked. She has never used  smokeless tobacco. She reports that she does not drink alcohol or use illicit drugs. History reviewed. No pertinent family history.       Allergies:   Allergies  Allergen Reactions  . Ativan [Lorazepam] Other (See Comments)    Increases fall risk and was told not to take it.  . Benztropine Mesylate Other (See Comments)    Restless legs  . Clonazepam Other (See Comments)    Increases fall risk and was told not to take it.  . Vicodin [Hydrocodone-Acetaminophen] Other (See Comments)    Increases fall risk and was told not to take it.  . Lisinopril Other (See Comments)    cough  . Sulfonamide Derivatives Other (See Comments)    Pt does not remember an allergic reaction to sulfa drugs    ACT Assessment Complete:  Yes:    Educational Status    Risk to Self: Risk to self Substance abuse history and/or treatment for substance abuse?: No  Risk to Others:    Abuse:    Prior Inpatient Therapy:    Prior Outpatient Therapy:    Additional Information:    Objective: Blood pressure 129/68, pulse 82, temperature 97.7 F (36.5 C), temperature source Oral, resp. rate 20, SpO2 95.00%.There is no weight on file to calculate BMI. Results for orders placed during the hospital encounter of 01/25/13 (from the past 72 hour(s))  ACETAMINOPHEN LEVEL  Status: None   Collection Time    01/25/13  5:48 PM      Result Value Range   Acetaminophen (Tylenol), Serum <15.0  10 - 30 ug/mL   Comment:            THERAPEUTIC CONCENTRATIONS VARY     SIGNIFICANTLY. A RANGE OF 10-30     ug/mL MAY BE AN EFFECTIVE     CONCENTRATION FOR MANY PATIENTS.     HOWEVER, SOME ARE BEST TREATED     AT CONCENTRATIONS OUTSIDE THIS     RANGE.     ACETAMINOPHEN CONCENTRATIONS     >150 ug/mL AT 4 HOURS AFTER     INGESTION AND >50 ug/mL AT 12     HOURS AFTER INGESTION ARE     OFTEN ASSOCIATED WITH TOXIC     REACTIONS.  CBC     Status: Abnormal   Collection Time    01/25/13  5:48 PM      Result Value Range   WBC 6.3   4.0 - 10.5 K/uL   RBC 3.71 (*) 3.87 - 5.11 MIL/uL   Hemoglobin 10.2 (*) 12.0 - 15.0 g/dL   HCT 54.0 (*) 98.1 - 19.1 %   MCV 84.1  78.0 - 100.0 fL   MCH 27.5  26.0 - 34.0 pg   MCHC 32.7  30.0 - 36.0 g/dL   RDW 47.8 (*) 29.5 - 62.1 %   Platelets 247  150 - 400 K/uL  COMPREHENSIVE METABOLIC PANEL     Status: Abnormal   Collection Time    01/25/13  5:48 PM      Result Value Range   Sodium 137  135 - 145 mEq/L   Potassium 3.6  3.5 - 5.1 mEq/L   Chloride 103  96 - 112 mEq/L   CO2 24  19 - 32 mEq/L   Glucose, Bld 112 (*) 70 - 99 mg/dL   BUN 14  6 - 23 mg/dL   Creatinine, Ser 3.08 (*) 0.50 - 1.10 mg/dL   Calcium 65.7  8.4 - 84.6 mg/dL   Total Protein 6.9  6.0 - 8.3 g/dL   Albumin 3.1 (*) 3.5 - 5.2 g/dL   AST 20  0 - 37 U/L   ALT 15  0 - 35 U/L   Alkaline Phosphatase 51  39 - 117 U/L   Total Bilirubin 0.2 (*) 0.3 - 1.2 mg/dL   GFR calc non Af Amer 47 (*) >90 mL/min   GFR calc Af Amer 54 (*) >90 mL/min   Comment: (NOTE)     The eGFR has been calculated using the CKD EPI equation.     This calculation has not been validated in all clinical situations.     eGFR's persistently <90 mL/min signify possible Chronic Kidney     Disease.  ETHANOL     Status: None   Collection Time    01/25/13  5:48 PM      Result Value Range   Alcohol, Ethyl (B) <11  0 - 11 mg/dL   Comment:            LOWEST DETECTABLE LIMIT FOR     SERUM ALCOHOL IS 11 mg/dL     FOR MEDICAL PURPOSES ONLY  SALICYLATE LEVEL     Status: Abnormal   Collection Time    01/25/13  5:48 PM      Result Value Range   Salicylate Lvl <2.0 (*) 2.8 - 20.0 mg/dL  VALPROIC ACID LEVEL  Status: None   Collection Time    01/25/13  5:48 PM      Result Value Range   Valproic Acid Lvl 80.9  50.0 - 100.0 ug/mL   Comment: Performed at [facility]  URINE RAPID DRUG SCREEN (HOSP PERFORMED)     Status: None   Collection Time    01/26/13  8:44 AM      Result Value Range   Opiates NONE DETECTED  NONE DETECTED   Cocaine NONE  DETECTED  NONE DETECTED   Benzodiazepines NONE DETECTED  NONE DETECTED   Amphetamines NONE DETECTED  NONE DETECTED   Tetrahydrocannabinol NONE DETECTED  NONE DETECTED   Barbiturates NONE DETECTED  NONE DETECTED   Comment:            DRUG SCREEN FOR MEDICAL PURPOSES     ONLY.  IF CONFIRMATION IS NEEDED     FOR ANY PURPOSE, NOTIFY LAB     WITHIN 5 DAYS.                LOWEST DETECTABLE LIMITS     FOR URINE DRUG SCREEN     Drug Class       Cutoff (ng/mL)     Amphetamine      1000     Barbiturate      200     Benzodiazepine   200     Tricyclics       300     Opiates          300     Cocaine          300     THC              50  URINALYSIS, ROUTINE W REFLEX MICROSCOPIC     Status: Abnormal   Collection Time    01/26/13  8:44 AM      Result Value Range   Color, Urine YELLOW  YELLOW   APPearance CLOUDY (*) CLEAR   Specific Gravity, Urine 1.016  1.005 - 1.030   pH 6.0  5.0 - 8.0   Glucose, UA NEGATIVE  NEGATIVE mg/dL   Hgb urine dipstick MODERATE (*) NEGATIVE   Bilirubin Urine NEGATIVE  NEGATIVE   Ketones, ur NEGATIVE  NEGATIVE mg/dL   Protein, ur NEGATIVE  NEGATIVE mg/dL   Urobilinogen, UA 0.2  0.0 - 1.0 mg/dL   Nitrite NEGATIVE  NEGATIVE   Leukocytes, UA LARGE (*) NEGATIVE  URINE MICROSCOPIC-ADD ON     Status: Abnormal   Collection Time    01/26/13  8:44 AM      Result Value Range   Squamous Epithelial / LPF FEW (*) RARE   WBC, UA 21-50  <3 WBC/hpf   RBC / HPF 0-2  <3 RBC/hpf   Bacteria, UA MANY (*) RARE   Labs are reviewed and are pertinent for no current urinalysis, CKD, Chronic anemia, non-specific ST changes, normal brain MRI, and therapeutic Depakote level.  Current Facility-Administered Medications  Medication Dose Route Frequency Provider Last Rate Last Dose  . acetaminophen (TYLENOL) tablet 650 mg  650 mg Oral Q6H PRN Suzi Roots, MD      . diltiazem (DILACOR XR) 24 hr capsule 180 mg  180 mg Oral Daily Suzi Roots, MD   180 mg at 01/26/13 0908  .  diphenhydrAMINE (BENADRYL) 50 MG/ML injection           . divalproex (DEPAKOTE ER) 24 hr tablet 500 mg  500 mg Oral  Daily Suzi Roots, MD   500 mg at 01/26/13 0908   Current Outpatient Prescriptions  Medication Sig Dispense Refill  . acetaminophen (TYLENOL) 325 MG tablet Take 650 mg by mouth every 6 (six) hours as needed for pain.      [name] Alum & Mag Hydroxide-Simeth (MAGIC MOUTHWASH W/LIDOCAINE) SOLN Take 2 mLs by mouth 4 (four) times daily as needed.  100 mL  0  . B Complex-C (B-COMPLEX WITH VITAMIN C) tablet Take 1 tablet by mouth daily.      . Calcium Citrate-Vitamin D (CALCIUM CITRATE + D3 PO) Take 1 tablet by mouth 2 (two) times daily.      [name] diltiazem (DILACOR XR) 180 MG 24 hr capsule Take 180 mg by mouth daily.      . divalproex (DEPAKOTE ER) 500 MG 24 hr tablet Take 1 tablet (500 mg total) by mouth daily.  30 tablet  0  . Flaxseed, Linseed, (FLAXSEED OIL PO) Take 1 capsule by mouth daily.      . Menthol, Topical Analgesic, (ICY HOT) 7.5 % (ROLL) MISC Apply 1 each topically as needed (for pain).      . Multiple Vitamins-Minerals (CENTRUM SILVER ADULT 50+ PO) Take 1 tablet by mouth daily.      . Omega-3 Fatty Acids (OMEGA-3 FISH OIL PO) Take 325 mg by mouth 3 (three) times daily.      . polyethylene glycol powder (GLYCOLAX/MIRALAX) powder Take 17 g by mouth daily as needed.  3350 g  1  . polyvinyl alcohol (LIQUIFILM TEARS) 1.4 % ophthalmic solution Place 1 drop into both eyes as needed. For dry eyes      . vitamin E 400 UNIT capsule Take 400 Units by mouth at bedtime.         Psychiatric Specialty Exam:     Blood pressure 129/68, pulse 82, temperature 97.7 F (36.5 C), temperature source Oral, resp. rate 20, SpO2 95.00%.There is no weight on file to calculate BMI.  General Appearance: Bizarre  Eye Contact::  Poor  Speech:  Garbled  Volume:  Increased  Mood:  Angry  Affect:  Inappropriate  Thought Process:  Disorganized  Orientation:  UTA  Thought Content:  NA  Suicidal  Thoughts:  UTA  Homicidal Thoughts:  UTA  Memory:  UTA  Judgement:  Impaired  Insight:  Lacking  Psychomotor Activity:  Increased  Concentration:  NA  Recall:  NA  Akathisia:  No  Handed:    AIMS (if indicated):     Assets:    Sleep:      Treatment Plan Summary: Medication Management 1. Consider use of Ativan 1-2 mgs q 6 hours. Documentation states she is a fall risk while on this medication, but does not preclude use of this due to allergic response. 2. Consider Vistaril 25-50mg  po q 6 hours for agitation. 3. Most 1st and 2nd generation antipsychotics have black box warnings with most deaths related to infectious causes and cardiovascular causes. The causality of the antipsychotic in relationship to death of the elderly is not clear. Recommend a pharmacy consult for further evaluation. 4. 1:1 for both fall risk as well as wandering precautions. 5. Recommend in patient stabilization at [facility] facility such as [place].  This patient is not an appropriate admission for [facility] due to her her age, inability to perform her own ADLS, inability to program, and behavioral issues.  Disposition:   Retain the note of Ms Dalene Seltzer as Ms Darnell is the same today.  She is screaming  at passersby to get out swearing at them, refusing to talk to black people and telling white people that they are black people. She continues very difficult to manage and will not follow any directions. She will need inpatient treatment at whatever facility can be found.
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
## 2013-01-26 NOTE — ED Notes (Signed)
pyschiatrist and psych PA talking with pt. Pt psychotic and speaking in tongues. Pt cursing and screaming, started walking around in the halls naked. Psych md and PA saw all this behavior. Pt already given medications with no change in behavior prior. Restraints ordered.
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
## 2013-01-27 ENCOUNTER — Encounter (HOSPITAL_COMMUNITY): Payer: Self-pay | Admitting: Registered Nurse

## 2013-01-27 DIAGNOSIS — F259 Schizoaffective disorder, unspecified: Secondary | ICD-10-CM

## 2013-01-27 LAB — URINE CULTURE: Colony Count: >=100000

## 2013-01-27 MED ORDER — HALOPERIDOL LACTATE 5 MG/ML IJ SOLN
2.0000 mg | Freq: Once | INTRAMUSCULAR | Status: AC
  Administered 2013-01-27: 2 mg via INTRAMUSCULAR
  Filled 2013-01-27: qty 1

## 2013-01-27 MED ORDER — LORAZEPAM 2 MG/ML IJ SOLN
2.0000 mg | Freq: Once | INTRAMUSCULAR | Status: AC
  Administered 2013-01-27: 2 mg via INTRAMUSCULAR
  Filled 2013-01-27: qty 1

## 2013-01-27 MED ORDER — CEPHALEXIN 500 MG PO CAPS
500.0000 mg | ORAL_CAPSULE | Freq: Three times a day (TID) | ORAL | Status: DC
  Administered 2013-01-27 – 2013-01-28 (×2): 500 mg via ORAL
  Filled 2013-01-27 (×2): qty 1

## 2013-01-27 MED ORDER — LORAZEPAM 2 MG/ML IJ SOLN
2.0000 mg | Freq: Once | INTRAMUSCULAR | Status: AC
  Administered 2013-01-27: 2 mg via INTRAVENOUS
  Filled 2013-01-27: qty 1

## 2013-01-27 MED ORDER — ASPIRIN 81 MG PO CHEW
81.0000 mg | CHEWABLE_TABLET | Freq: Every day | ORAL | Status: DC
  Administered 2013-01-27: 81 mg via ORAL
  Filled 2013-01-27: qty 1

## 2013-01-27 NOTE — Progress Notes (Signed)
Sandre Kitty will have NP review referral on 01/27/13.  Referrals were declined at [facility] and [facility].  [facility] has no beds and is not under review.  Referral faxed to [facility] with bed availability.  Blain Pais, QP [facility]

## 2013-01-27 NOTE — ED Notes (Signed)
Pt with increasingly aggressive verbal and physical behavior. Attempted 1:1 care, decreased stimulation and deescalation to no avail. MD informed. Will continue to monitor.

## 2013-01-27 NOTE — Consult Note (Signed)
[facility] Face to Face Consultation   Reason for Consult:  Follow up evaluation for inpatient treatment; or medication recommendations for discharge Referring Physician:  EDP [name] is an 72 y.o. female.  Assessment: AXIS I:  Schizoaffective Disorder AXIS II:  Deferred AXIS III:   Past Medical History  Diagnosis Date  . Depression   . Gout   . Bipolar 1 disorder   . Schizoaffective disorder   . Chronic kidney disease   . Hypertension   . Seizure disorder   . Thrombocytopenia   . Anemia    AXIS IV:  problems related to social environment AXIS V:  31-40 impairment in reality testing  Subjective:   [name] is a 72 y.o. female patient admitted with behavioral changes.  HPI: Patient is resting in bed.  Sitter at bed side.  Covers pulled from patient head, she looked up and closed her eyes.  Earlier this morning patient was agitated and yelling.  She was again put in restraints and given 2 mg of ativan and 2 mg of haldol.  Continue to monitor for safety and stabilization while looking for inpatient treatment bed ([facility] psych).    HPI Elements:   Location:  [facility]. Quality:  acute. Severity:  moderate to severe. Timing:  unclear. Duration:  since re-admission to hospital from ECF. Context:  Behavioral changes.  Past Psychiatric History: Past Medical History  Diagnosis Date  . Depression   . Gout   . Bipolar 1 disorder   . Schizoaffective disorder   . Chronic kidney disease   . Hypertension   . Seizure disorder   . Thrombocytopenia   . Anemia     reports that she has never smoked. She has never used smokeless tobacco. She reports that she does not drink alcohol or use illicit drugs. History reviewed. No pertinent family history.       Allergies:   Allergies  Allergen Reactions  . Ativan [Lorazepam] Other (See Comments)    Increases fall risk and was told not to take it.  . Benztropine Mesylate Other (See Comments)    Restless legs  . Clonazepam Other (See  Comments)    Increases fall risk and was told not to take it.  . Vicodin [Hydrocodone-Acetaminophen] Other (See Comments)    Increases fall risk and was told not to take it.  . Lisinopril Other (See Comments)    cough  . Sulfonamide Derivatives Other (See Comments)    Pt does not remember an allergic reaction to sulfa drugs    ACT Assessment Complete:  Yes:    Educational Status    Risk to Self: Risk to self Substance abuse history and/or treatment for substance abuse?: No  Risk to Others:    Abuse:    Prior Inpatient Therapy:    Prior Outpatient Therapy:    Additional Information:    Objective: Blood pressure 146/76, pulse 89, temperature 97.8 F (36.6 C), temperature source Oral, resp. rate 20, SpO2 95.00%.There is no weight on file to calculate BMI. Results for orders placed during the hospital encounter of 01/25/13 (from the past 72 hour(s))  ACETAMINOPHEN LEVEL     Status: None   Collection Time    01/25/13  5:48 PM      Result Value Range   Acetaminophen (Tylenol), Serum <15.0  10 - 30 ug/mL   Comment:            THERAPEUTIC CONCENTRATIONS VARY     SIGNIFICANTLY. A RANGE OF 10-30  ug/mL MAY BE AN EFFECTIVE     CONCENTRATION FOR MANY PATIENTS.     HOWEVER, SOME ARE BEST TREATED     AT CONCENTRATIONS OUTSIDE THIS     RANGE.     ACETAMINOPHEN CONCENTRATIONS     >150 ug/mL AT 4 HOURS AFTER     INGESTION AND >50 ug/mL AT 12     HOURS AFTER INGESTION ARE     OFTEN ASSOCIATED WITH TOXIC     REACTIONS.  CBC     Status: Abnormal   Collection Time    01/25/13  5:48 PM      Result Value Range   WBC 6.3  4.0 - 10.5 K/uL   RBC 3.71 (*) 3.87 - 5.11 MIL/uL   Hemoglobin 10.2 (*) 12.0 - 15.0 g/dL   HCT 40.9 (*) 81.1 - 91.4 %   MCV 84.1  78.0 - 100.0 fL   MCH 27.5  26.0 - 34.0 pg   MCHC 32.7  30.0 - 36.0 g/dL   RDW 78.2 (*) 95.6 - 21.3 %   Platelets 247  150 - 400 K/uL  COMPREHENSIVE METABOLIC PANEL     Status: Abnormal   Collection Time    01/25/13  5:48 PM       Result Value Range   Sodium 137  135 - 145 mEq/L   Potassium 3.6  3.5 - 5.1 mEq/L   Chloride 103  96 - 112 mEq/L   CO2 24  19 - 32 mEq/L   Glucose, Bld 112 (*) 70 - 99 mg/dL   BUN 14  6 - 23 mg/dL   Creatinine, Ser 0.86 (*) 0.50 - 1.10 mg/dL   Calcium 57.8  8.4 - 46.9 mg/dL   Total Protein 6.9  6.0 - 8.3 g/dL   Albumin 3.1 (*) 3.5 - 5.2 g/dL   AST 20  0 - 37 U/L   ALT 15  0 - 35 U/L   Alkaline Phosphatase 51  39 - 117 U/L   Total Bilirubin 0.2 (*) 0.3 - 1.2 mg/dL   GFR calc non Af Amer 47 (*) >90 mL/min   GFR calc Af Amer 54 (*) >90 mL/min   Comment: (NOTE)     The eGFR has been calculated using the CKD EPI equation.     This calculation has not been validated in all clinical situations.     eGFR's persistently <90 mL/min signify possible Chronic Kidney     Disease.  ETHANOL     Status: None   Collection Time    01/25/13  5:48 PM      Result Value Range   Alcohol, Ethyl (B) <11  0 - 11 mg/dL   Comment:            LOWEST DETECTABLE LIMIT FOR     SERUM ALCOHOL IS 11 mg/dL     FOR MEDICAL PURPOSES ONLY  SALICYLATE LEVEL     Status: Abnormal   Collection Time    01/25/13  5:48 PM      Result Value Range   Salicylate Lvl <2.0 (*) 2.8 - 20.0 mg/dL  VALPROIC ACID LEVEL     Status: None   Collection Time    01/25/13  5:48 PM      Result Value Range   Valproic Acid Lvl 80.9  50.0 - 100.0 ug/mL   Comment: Performed at [facility]  URINE RAPID DRUG SCREEN (HOSP PERFORMED)     Status: None   Collection Time  01/26/13  8:44 AM      Result Value Range   Opiates NONE DETECTED  NONE DETECTED   Cocaine NONE DETECTED  NONE DETECTED   Benzodiazepines NONE DETECTED  NONE DETECTED   Amphetamines NONE DETECTED  NONE DETECTED   Tetrahydrocannabinol NONE DETECTED  NONE DETECTED   Barbiturates NONE DETECTED  NONE DETECTED   Comment:            DRUG SCREEN FOR MEDICAL PURPOSES     ONLY.  IF CONFIRMATION IS NEEDED     FOR ANY PURPOSE, NOTIFY LAB     WITHIN 5 DAYS.                 LOWEST DETECTABLE LIMITS     FOR URINE DRUG SCREEN     Drug Class       Cutoff (ng/mL)     Amphetamine      1000     Barbiturate      200     Benzodiazepine   200     Tricyclics       300     Opiates          300     Cocaine          300     THC              50  URINALYSIS, ROUTINE W REFLEX MICROSCOPIC     Status: Abnormal   Collection Time    01/26/13  8:44 AM      Result Value Range   Color, Urine YELLOW  YELLOW   APPearance CLOUDY (*) CLEAR   Specific Gravity, Urine 1.016  1.005 - 1.030   pH 6.0  5.0 - 8.0   Glucose, UA NEGATIVE  NEGATIVE mg/dL   Hgb urine dipstick MODERATE (*) NEGATIVE   Bilirubin Urine NEGATIVE  NEGATIVE   Ketones, ur NEGATIVE  NEGATIVE mg/dL   Protein, ur NEGATIVE  NEGATIVE mg/dL   Urobilinogen, UA 0.2  0.0 - 1.0 mg/dL   Nitrite NEGATIVE  NEGATIVE   Leukocytes, UA LARGE (*) NEGATIVE  URINE MICROSCOPIC-ADD ON     Status: Abnormal   Collection Time    01/26/13  8:44 AM      Result Value Range   Squamous Epithelial / LPF FEW (*) RARE   WBC, UA 21-50  <3 WBC/hpf   RBC / HPF 0-2  <3 RBC/hpf   Bacteria, UA MANY (*) RARE  URINE CULTURE     Status: None   Collection Time    01/26/13  8:44 AM      Result Value Range   Specimen Description URINE, CLEAN CATCH     Special Requests NONE     Culture  Setup Time       Value: 01/26/2013 12:55     Performed at [facility] Count       Value: >=100,000 COLONIES/ML     Performed at [facility]   Culture       Value: ESCHERICHIA COLI     Performed at [facility]   Report Status PENDING    URINALYSIS, ROUTINE W REFLEX MICROSCOPIC     Status: Abnormal   Collection Time    01/26/13 10:52 PM      Result Value Range   Color, Urine YELLOW  YELLOW   APPearance CLEAR  CLEAR   Specific Gravity, Urine 1.013  1.005 - 1.030   pH 7.0  5.0 -  8.0   Glucose, UA NEGATIVE  NEGATIVE mg/dL   Hgb urine dipstick TRACE (*) NEGATIVE   Bilirubin Urine NEGATIVE  NEGATIVE   Ketones, ur  NEGATIVE  NEGATIVE mg/dL   Protein, ur NEGATIVE  NEGATIVE mg/dL   Urobilinogen, UA 0.2  0.0 - 1.0 mg/dL   Nitrite NEGATIVE  NEGATIVE   Leukocytes, UA MODERATE (*) NEGATIVE  URINE MICROSCOPIC-ADD ON     Status: None   Collection Time    01/26/13 10:52 PM      Result Value Range   Squamous Epithelial / LPF RARE  RARE   WBC, UA 11-20  <3 WBC/hpf   RBC / HPF 0-2  <3 RBC/hpf   Labs are reviewed and are pertinent for no current urinalysis, CKD, Chronic anemia, non-specific ST changes, normal brain MRI, and therapeutic Depakote level.  Current Facility-Administered Medications  Medication Dose Route Frequency Provider Last Rate Last Dose  . acetaminophen (TYLENOL) tablet 650 mg  650 mg Oral Q6H PRN Suzi Roots, MD      . aspirin chewable tablet 81 mg  81 mg Oral Daily Gilda Crease, MD   81 mg at 01/27/13 1120  . cephALEXin (KEFLEX) capsule 500 mg  500 mg Oral Q8H Olivia Mackie, MD      . diltiazem (DILACOR XR) 24 hr capsule 180 mg  180 mg Oral Daily Suzi Roots, MD   180 mg at 01/27/13 1119  . divalproex (DEPAKOTE ER) 24 hr tablet 500 mg  500 mg Oral Daily Suzi Roots, MD   500 mg at 01/27/13 1120   Current Outpatient Prescriptions  Medication Sig Dispense Refill  . acetaminophen (TYLENOL) 325 MG tablet Take 650 mg by mouth every 6 (six) hours as needed for pain.      [name] Alum & Mag Hydroxide-Simeth (MAGIC MOUTHWASH W/LIDOCAINE) SOLN Take 2 mLs by mouth 4 (four) times daily as needed.  100 mL  0  . B Complex-C (B-COMPLEX WITH VITAMIN C) tablet Take 1 tablet by mouth daily.      . Calcium Citrate-Vitamin D (CALCIUM CITRATE + D3 PO) Take 1 tablet by mouth 2 (two) times daily.      [name] diltiazem (DILACOR XR) 180 MG 24 hr capsule Take 180 mg by mouth daily.      . divalproex (DEPAKOTE ER) 500 MG 24 hr tablet Take 1 tablet (500 mg total) by mouth daily.  30 tablet  0  . Flaxseed, Linseed, (FLAXSEED OIL PO) Take 1 capsule by mouth daily.      . Menthol, Topical Analgesic, (ICY HOT)  7.5 % (ROLL) MISC Apply 1 each topically as needed (for pain).      . Multiple Vitamins-Minerals (CENTRUM SILVER ADULT 50+ PO) Take 1 tablet by mouth daily.      . Omega-3 Fatty Acids (OMEGA-3 FISH OIL PO) Take 325 mg by mouth 3 (three) times daily.      . polyethylene glycol powder (GLYCOLAX/MIRALAX) powder Take 17 g by mouth daily as needed.  3350 g  1  . polyvinyl alcohol (LIQUIFILM TEARS) 1.4 % ophthalmic solution Place 1 drop into both eyes as needed. For dry eyes      . vitamin E 400 UNIT capsule Take 400 Units by mouth at bedtime.         Psychiatric Specialty Exam:     Blood pressure 146/76, pulse 89, temperature 97.8 F (36.6 C), temperature source Oral, resp. rate 20, SpO2 95.00%.There is no weight on file to calculate  BMI.  General Appearance: Bizarre  Eye Contact::  Poor  Speech:  Garbled  Volume:  Increased  Mood:  Angry  Affect:  Inappropriate  Thought Process:  Disorganized  Orientation:  UTA  Thought Content:  NA  Suicidal Thoughts:  UTA  Homicidal Thoughts:  UTA  Memory:  UTA  Judgement:  Impaired  Insight:  Lacking  Psychomotor Activity:  Increased  Concentration:  NA  Recall:  NA  Akathisia:  No  Handed:    AIMS (if indicated):     Assets:    Sleep:      Face to face interview and consult with Dr. Lolly Mustache  Treatment Plan Summary:  Disposition:  Continue with current plan for inpatient treatment at [facility] psych facility.  Will continue to monitor for safety and stabilization until bed is found. If stabilization before bed is found patient may be discharge back to Nursing facility.  Shuvon B. Rankin FNP-BC Family Nurse Practitioner, Board Certified  I have personally seen the patient and agreed with the findings and involved in the treatment plan. Kathryne Sharper, MD

## 2013-01-27 NOTE — ED Notes (Signed)
Pt moved to room 25; belongings still in locker 32; brother came to visit patient and was updated on her status

## 2013-01-27 NOTE — Progress Notes (Signed)
Pt accepted to [facility] by Dr. Perfecto Kingdom. Per Olegario Messier at [place], pt can not come until after 8pm. CSW spoke with sherrif's department who stated they can not transport that late but can arrange for transport in the morning. Pt will need to be coordinated with sherrif and ptar in the morning. CSW spoke with Olegario Messier at [place] who stated she will hold patient bed until the morning.   Catha Gosselin, LCSW [phone]  ED CSW .01/27/2013 1436pm

## 2013-01-27 NOTE — ED Notes (Signed)
Pt verbally aggressive towards sitters, yelling and threatening. Pt is calm towards RN and was able to be redirected at this time; medication pulled, informed patient if she continued I would have to give her medicaiton; she agreed and apologized for behavior. Will continue to assess patient closely.

## 2013-01-27 NOTE — ED Notes (Signed)
Pt becoming more violent; swinging at staff; unable to calm or redirect at this time; medication per MD order; bilateral wrist and ankle restraints applied per MD order

## 2013-01-27 NOTE — ED Provider Notes (Signed)
Pt agitated, yelling, requiring restraints.  She has done well since receiving 2 mg of ativan and 2 mg of haldol.  Her allergy to ativan is increased risk for falls.    Olivia Mackie, MD 01/27/13 [phone]

## 2013-01-27 NOTE — Progress Notes (Addendum)
CSW spoke with Olegario Messier at [place], who stated patient will be reviewed with MD this morning.  CSW refaxed referral to [facility] as requested by admissions and pending review.    Catha Gosselin, LCSW [phone]  ED CSW .01/27/2013 8:35am   At this time patient is unable to make her own decisions. CSW updated pt brother, who is pt emergency contact. Pt brother is on board for patient to be treated at a hospital and then returning to [facility].   Frutoso Schatz [phone]  ED CSW 01/27/2013 835am   Pt declined at [facility], [place], and [place].  Pt pending review at [facility].  CSW initiated [facility] referral. [facility] Authorization # [id number].   Marland KitchenCatha Gosselin, [place] [phone]  ED CSW .01/27/2013 835am

## 2013-01-27 NOTE — ED Notes (Signed)
IVC papers provided by [employer]; placed in chart

## 2013-01-27 NOTE — ED Notes (Signed)
Bed: WA25 Expected date:  Expected time:  Means of arrival:  Comments: 

## 2013-01-28 DIAGNOSIS — Z888 Allergy status to other drugs, medicaments and biological substances status: Secondary | ICD-10-CM | POA: Diagnosis not present

## 2013-01-28 DIAGNOSIS — Z79899 Other long term (current) drug therapy: Secondary | ICD-10-CM | POA: Diagnosis not present

## 2013-01-28 DIAGNOSIS — R509 Fever, unspecified: Secondary | ICD-10-CM | POA: Diagnosis not present

## 2013-01-28 DIAGNOSIS — F919 Conduct disorder, unspecified: Secondary | ICD-10-CM | POA: Diagnosis not present

## 2013-01-28 DIAGNOSIS — F200 Paranoid schizophrenia: Secondary | ICD-10-CM | POA: Diagnosis not present

## 2013-01-28 DIAGNOSIS — D509 Iron deficiency anemia, unspecified: Secondary | ICD-10-CM | POA: Diagnosis present

## 2013-01-28 DIAGNOSIS — N179 Acute kidney failure, unspecified: Secondary | ICD-10-CM | POA: Diagnosis not present

## 2013-01-28 DIAGNOSIS — R569 Unspecified convulsions: Secondary | ICD-10-CM | POA: Diagnosis not present

## 2013-01-28 DIAGNOSIS — S0990XA Unspecified injury of head, initial encounter: Secondary | ICD-10-CM | POA: Diagnosis not present

## 2013-01-28 DIAGNOSIS — R489 Unspecified symbolic dysfunctions: Secondary | ICD-10-CM | POA: Diagnosis not present

## 2013-01-28 DIAGNOSIS — I129 Hypertensive chronic kidney disease with stage 1 through stage 4 chronic kidney disease, or unspecified chronic kidney disease: Secondary | ICD-10-CM | POA: Diagnosis present

## 2013-01-28 DIAGNOSIS — I10 Essential (primary) hypertension: Secondary | ICD-10-CM | POA: Diagnosis not present

## 2013-01-28 DIAGNOSIS — R5381 Other malaise: Secondary | ICD-10-CM | POA: Diagnosis present

## 2013-01-28 DIAGNOSIS — Z885 Allergy status to narcotic agent status: Secondary | ICD-10-CM | POA: Diagnosis not present

## 2013-01-28 DIAGNOSIS — F259 Schizoaffective disorder, unspecified: Secondary | ICD-10-CM | POA: Diagnosis not present

## 2013-01-28 DIAGNOSIS — R269 Unspecified abnormalities of gait and mobility: Secondary | ICD-10-CM | POA: Diagnosis present

## 2013-01-28 DIAGNOSIS — G319 Degenerative disease of nervous system, unspecified: Secondary | ICD-10-CM | POA: Diagnosis not present

## 2013-01-28 DIAGNOSIS — D696 Thrombocytopenia, unspecified: Secondary | ICD-10-CM | POA: Diagnosis not present

## 2013-01-28 DIAGNOSIS — J988 Other specified respiratory disorders: Secondary | ICD-10-CM | POA: Diagnosis not present

## 2013-01-28 DIAGNOSIS — M109 Gout, unspecified: Secondary | ICD-10-CM | POA: Diagnosis present

## 2013-01-28 DIAGNOSIS — Z882 Allergy status to sulfonamides status: Secondary | ICD-10-CM | POA: Diagnosis not present

## 2013-01-28 DIAGNOSIS — F319 Bipolar disorder, unspecified: Secondary | ICD-10-CM | POA: Diagnosis not present

## 2013-01-28 DIAGNOSIS — M6281 Muscle weakness (generalized): Secondary | ICD-10-CM | POA: Diagnosis not present

## 2013-01-28 DIAGNOSIS — F489 Nonpsychotic mental disorder, unspecified: Secondary | ICD-10-CM | POA: Diagnosis not present

## 2013-01-28 DIAGNOSIS — D72829 Elevated white blood cell count, unspecified: Secondary | ICD-10-CM | POA: Diagnosis not present

## 2013-01-28 DIAGNOSIS — D649 Anemia, unspecified: Secondary | ICD-10-CM | POA: Diagnosis not present

## 2013-01-28 DIAGNOSIS — F312 Bipolar disorder, current episode manic severe with psychotic features: Secondary | ICD-10-CM | POA: Diagnosis not present

## 2013-01-28 DIAGNOSIS — N183 Chronic kidney disease, stage 3 unspecified: Secondary | ICD-10-CM | POA: Diagnosis not present

## 2013-01-28 DIAGNOSIS — R05 Cough: Secondary | ICD-10-CM | POA: Diagnosis not present

## 2013-01-28 DIAGNOSIS — F0390 Unspecified dementia without behavioral disturbance: Secondary | ICD-10-CM | POA: Diagnosis not present

## 2013-01-28 DIAGNOSIS — IMO0002 Reserved for concepts with insufficient information to code with codable children: Secondary | ICD-10-CM | POA: Diagnosis not present

## 2013-01-28 DIAGNOSIS — F29 Unspecified psychosis not due to a substance or known physiological condition: Secondary | ICD-10-CM | POA: Diagnosis not present

## 2013-01-28 DIAGNOSIS — N390 Urinary tract infection, site not specified: Secondary | ICD-10-CM | POA: Diagnosis not present

## 2013-01-28 DIAGNOSIS — R4182 Altered mental status, unspecified: Secondary | ICD-10-CM | POA: Diagnosis not present

## 2013-01-28 DIAGNOSIS — G40909 Epilepsy, unspecified, not intractable, without status epilepticus: Secondary | ICD-10-CM | POA: Diagnosis not present

## 2013-01-28 DIAGNOSIS — F411 Generalized anxiety disorder: Secondary | ICD-10-CM | POA: Diagnosis not present

## 2013-01-28 DIAGNOSIS — F3111 Bipolar disorder, current episode manic without psychotic features, mild: Secondary | ICD-10-CM | POA: Diagnosis not present

## 2013-01-28 NOTE — ED Provider Notes (Signed)
Pt alert, content. Normal vitals. No c/o.  Psych team indicates pt accepted to [facility], Dr Janice Norrie, bed ready.  Pt appears stable for transfer.    Suzi Roots, MD 01/28/13 [phone]

## 2013-01-28 NOTE — ED Notes (Signed)
Bed: WJ19 Expected date:  Expected time:  Means of arrival:  Comments: Bed 28

## 2013-01-28 NOTE — Progress Notes (Signed)
CSW confirmed with Delorise Shiner that patient can come to [place]. Pt accepted to Dr. Perfecto Kingdom. Pt to transported under IVC by sheriff. RN and EDP aware. .No further Clinical Social Work needs, signing off.   Catha Gosselin, LCSW [phone]  ED CSW .01/28/2013 8:16am

## 2013-01-28 NOTE — ED Notes (Signed)
[facility] Department notified of need for transportation to [facility].

## 2013-02-18 ENCOUNTER — Encounter: Payer: Self-pay | Admitting: Cardiology

## 2013-02-19 IMAGING — CR DG KNEE COMPLETE 4+V*R*
4 series · 4 of 4 positions shown · non-contrast
Comparison: 12/20/2011

CLINICAL DATA: Fell.  Medial pain.

RIGHT KNEE - COMPLETE 4+ VIEW

[t knee ap right]
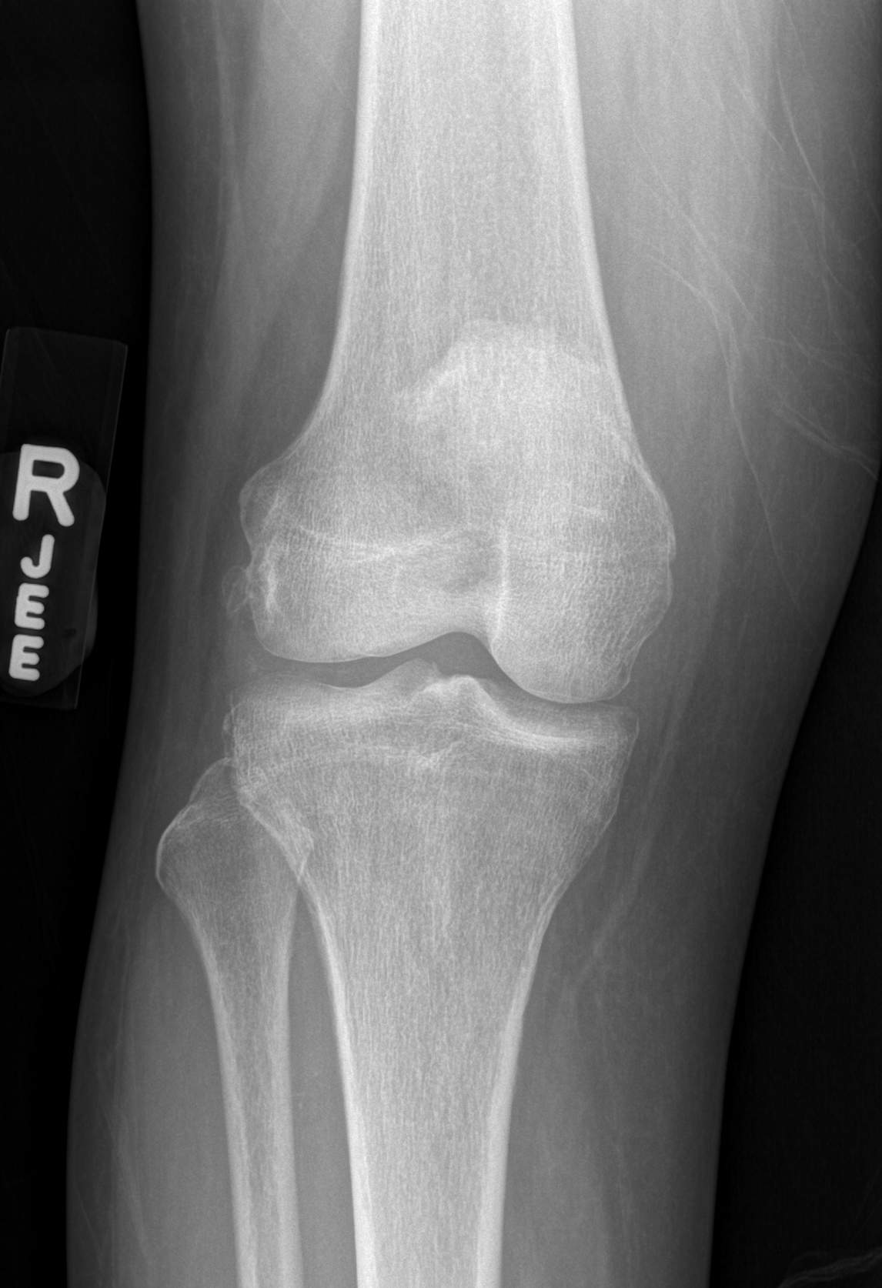

[t knee obl right (1 of 2)]
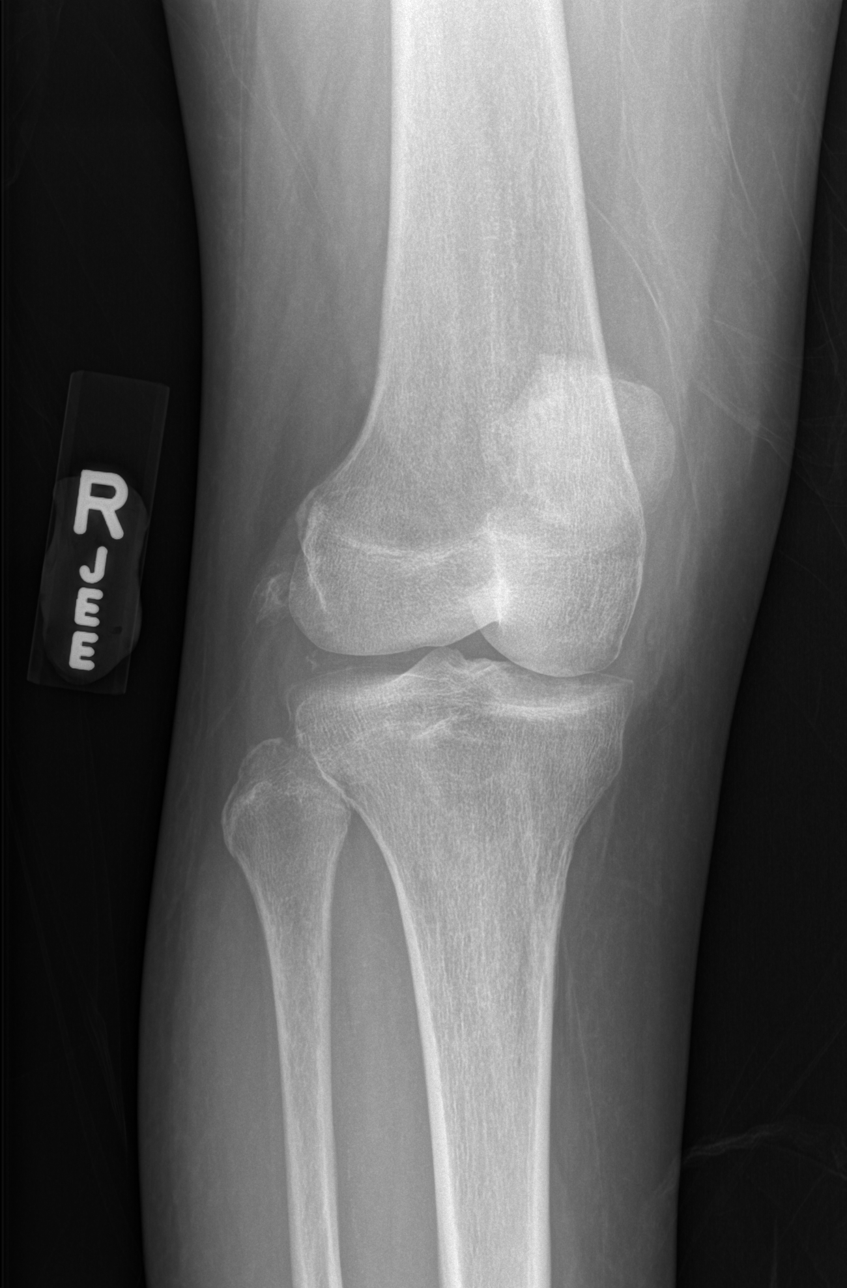

[t knee obl right (2 of 2)]
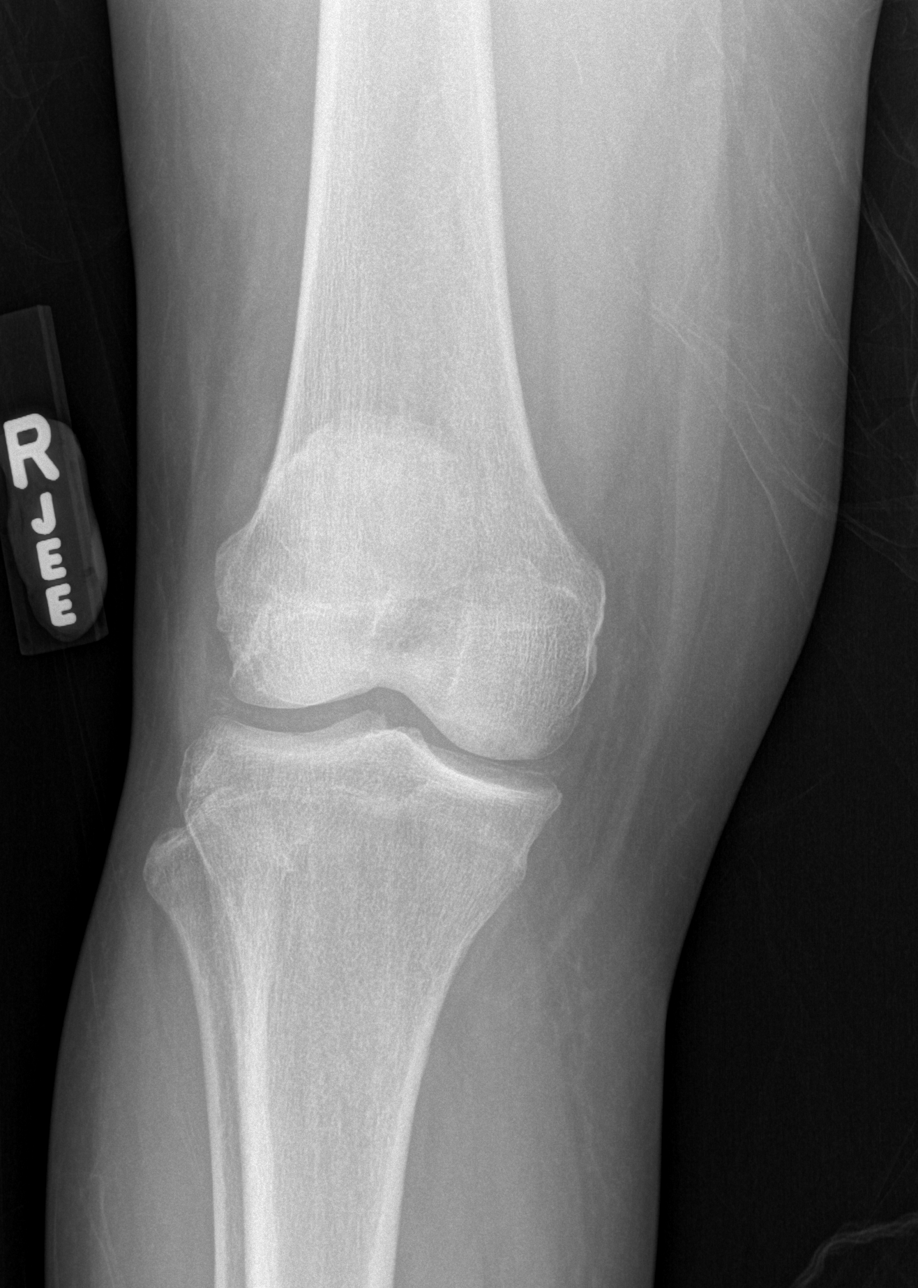

[t knee lat right]
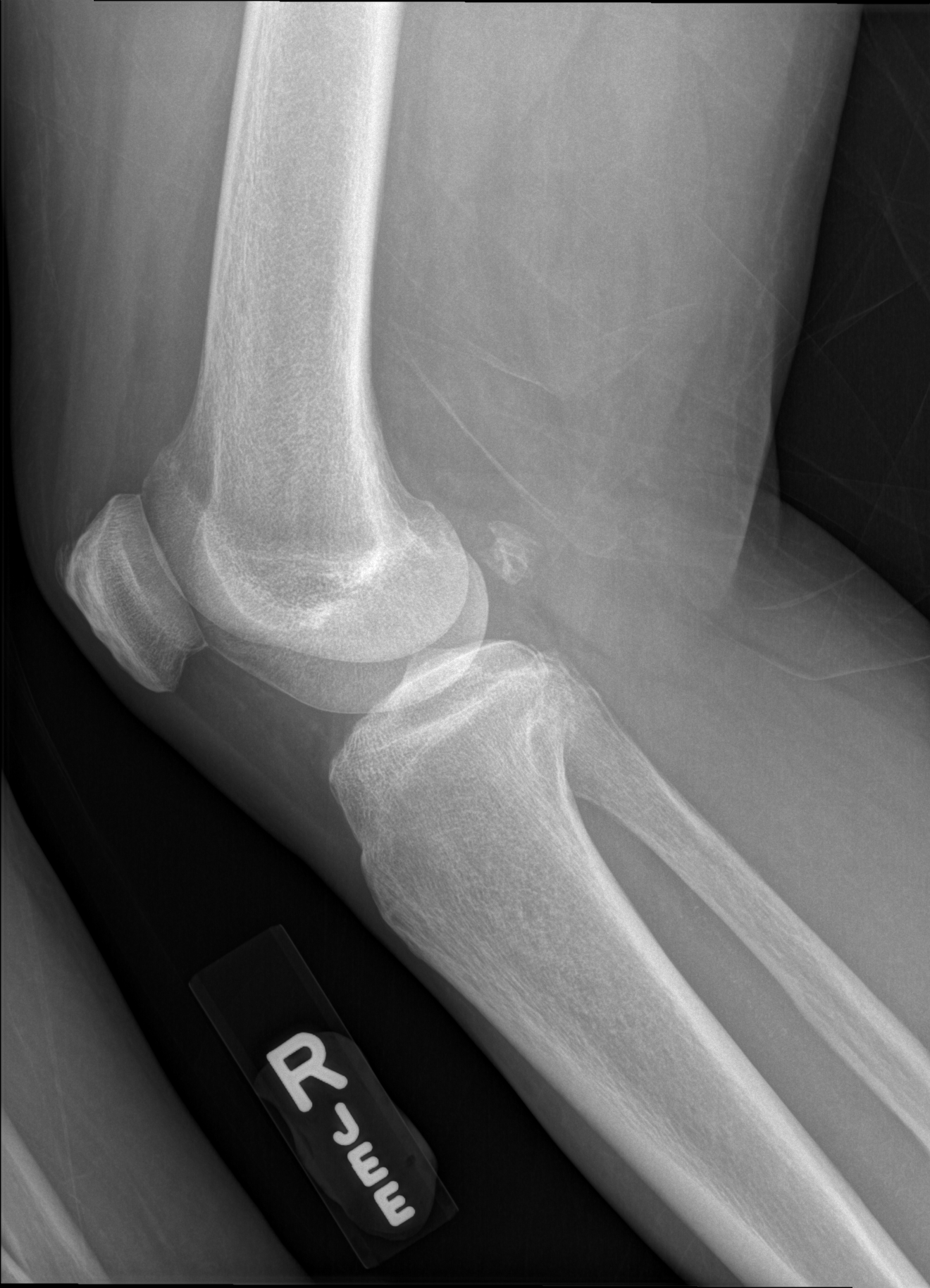

[4 of 4 positions shown; findings below may reference images not displayed]

FINDINGS: no visible joint effusion.  No evidence of acute
fracture.  There is chronic chondrocalcinosis.  Prominent fabella
laterally.
IMPRESSION: No acute finding.  Chronic chondrocalcinosis.

## 2013-02-19 NOTE — Progress Notes (Signed)
Patient ID: [name], female   DOB: 04-Jun-1940, 72 y.o.   MRN: [id number]        HISTORY & PHYSICAL  DATE: 01/25/2013   FACILITY: [facility]  LEVEL OF CARE: SNF (31)  ALLERGIES:  Allergies  Allergen Reactions  . Ativan [Lorazepam] Other (See Comments)    Increases fall risk and was told not to take it.  . Benztropine Mesylate Other (See Comments)    Restless legs  . Clonazepam Other (See Comments)    Increases fall risk and was told not to take it.  . Vicodin [Hydrocodone-Acetaminophen] Other (See Comments)    Increases fall risk and was told not to take it.  . Lisinopril Other (See Comments)    cough  . Sulfonamide Derivatives Other (See Comments)    Pt does not remember an allergic reaction to sulfa drugs    CHIEF COMPLAINT:  Manage seizure disorder, chronic kidney disease stage III, and renovascular hypertension.    HISTORY OF PRESENT ILLNESS:  The patient is a 72 year-old, African-American female who was hospitalized for altered mental status.  After hospitalization, she is admitted to this facility for possible long-term care management and short-term rehabilitation.      SEIZURE DISORDER: EEG did not show epileptic findings.  MRI of the brain was negative for acute findings.  The patient's seizure disorder remains stable. No complications reported from the medications presently being used. Staff do not report any recent seizure activity.    CHRONIC KIDNEY DISEASE: The patient's chronic kidney disease remains stable.  Staff denies increasing lower extremity swelling or confusion. Last BUN and creatinine are:  BUN 18, creatinine 1.06.  Patient is a poor historian.    HTN: Pt 's HTN remains stable.  Staff denies CP, sob, DOE, pedal edema, headaches, dizziness or visual disturbances.  No complications from the medications currently being used.  Last BP :  106/55.    PAST MEDICAL HISTORY :  Past Medical History  Diagnosis Date  . Depression   . Gout   .  Bipolar 1 disorder   . Schizoaffective disorder   . Chronic kidney disease   . Hypertension   . Seizure disorder   . Thrombocytopenia   . Anemia     PAST SURGICAL HISTORY: Past Surgical History  Procedure Laterality Date  . Tubal ligation    . Appendectomy      SOCIAL HISTORY:  reports that she has never smoked. She has never used smokeless tobacco. She reports that she does not drink alcohol or use illicit drugs.  FAMILY HISTORY:   Unobtainable.  Patient is a poor historian.      CURRENT MEDICATIONS: Reviewed per [facility]  REVIEW OF SYSTEMS:   Unobtainable.  Patient is a poor historian.      PHYSICAL EXAMINATION  VS:  T 99       P 112      RR 22      BP 106/55      POX%        WT (Lb) 142.5     GENERAL: no acute distress, normal body habitus EYES: conjunctivae normal, sclerae normal, normal eye lids MOUTH/THROAT: lips without lesions,no lesions in the mouth,tongue is without lesions,uvula elevates in midline NECK: supple, trachea midline, no neck masses, no thyroid tenderness, no thyromegaly LYMPHATICS: no LAN in the neck, no supraclavicular LAN RESPIRATORY: breathing is even & unlabored, BS CTAB CARDIAC: RRR, no murmur,no extra heart sounds, no edema GI:  ABDOMEN:  abdomen soft, normal BS, no masses, no tenderness  LIVER/SPLEEN: no hepatomegaly, no splenomegaly MUSCULOSKELETAL: HEAD: normal to inspection & palpation BACK: no kyphosis, scoliosis or spinal processes tenderness EXTREMITIES: LEFT UPPER EXTREMITY: full range of motion, normal strength & tone RIGHT UPPER EXTREMITY:  full range of motion, normal strength & tone LEFT LOWER EXTREMITY:  full range of motion, normal strength & tone RIGHT LOWER EXTREMITY:  full range of motion, normal strength & tone PSYCHIATRIC: the patient is alert, disoriented, affect & behavior appropriate  LABS/RADIOLOGY: Abdominal ultrasound:  Negative for acute findings.    Hepatitis panel negative.    EKG:  Showed T-wave abnormalities  and low QT interval.    Hemoglobin 11.1, otherwise CBC normal.    Albumin 2.5, otherwise CMP normal.    ASSESSMENT/PLAN:  Seizure disorder.  Well controlled.    Chronic kidney disease stage III.  Reassess renal functions.    Renovascular hypertension.  Well controlled.     Schizoaffective disorder.  Continue current medications.    Check CBC and BMP.    I have reviewed patient's medical records received at admission/from hospitalization.  CPT CODE: 16109

## 2013-02-20 DIAGNOSIS — I150 Renovascular hypertension: Secondary | ICD-10-CM | POA: Insufficient documentation

## 2013-03-05 DIAGNOSIS — F312 Bipolar disorder, current episode manic severe with psychotic features: Secondary | ICD-10-CM | POA: Diagnosis not present

## 2013-03-05 DIAGNOSIS — E559 Vitamin D deficiency, unspecified: Secondary | ICD-10-CM | POA: Diagnosis not present

## 2013-03-05 DIAGNOSIS — G40909 Epilepsy, unspecified, not intractable, without status epilepticus: Secondary | ICD-10-CM | POA: Diagnosis not present

## 2013-03-05 DIAGNOSIS — L8992 Pressure ulcer of unspecified site, stage 2: Secondary | ICD-10-CM | POA: Diagnosis not present

## 2013-03-05 DIAGNOSIS — I150 Renovascular hypertension: Secondary | ICD-10-CM | POA: Diagnosis not present

## 2013-03-05 DIAGNOSIS — F29 Unspecified psychosis not due to a substance or known physiological condition: Secondary | ICD-10-CM | POA: Diagnosis not present

## 2013-03-05 DIAGNOSIS — N183 Chronic kidney disease, stage 3 unspecified: Secondary | ICD-10-CM | POA: Diagnosis not present

## 2013-03-05 DIAGNOSIS — F39 Unspecified mood [affective] disorder: Secondary | ICD-10-CM | POA: Diagnosis not present

## 2013-03-05 DIAGNOSIS — F919 Conduct disorder, unspecified: Secondary | ICD-10-CM | POA: Diagnosis not present

## 2013-03-05 DIAGNOSIS — R4182 Altered mental status, unspecified: Secondary | ICD-10-CM | POA: Diagnosis not present

## 2013-03-05 DIAGNOSIS — F259 Schizoaffective disorder, unspecified: Secondary | ICD-10-CM | POA: Diagnosis not present

## 2013-03-05 DIAGNOSIS — R269 Unspecified abnormalities of gait and mobility: Secondary | ICD-10-CM | POA: Diagnosis not present

## 2013-03-05 DIAGNOSIS — K5900 Constipation, unspecified: Secondary | ICD-10-CM | POA: Diagnosis not present

## 2013-03-05 DIAGNOSIS — Z79899 Other long term (current) drug therapy: Secondary | ICD-10-CM | POA: Diagnosis not present

## 2013-03-05 DIAGNOSIS — R300 Dysuria: Secondary | ICD-10-CM | POA: Diagnosis not present

## 2013-03-05 DIAGNOSIS — F319 Bipolar disorder, unspecified: Secondary | ICD-10-CM | POA: Diagnosis not present

## 2013-03-05 DIAGNOSIS — F3111 Bipolar disorder, current episode manic without psychotic features, mild: Secondary | ICD-10-CM | POA: Diagnosis not present

## 2013-03-05 DIAGNOSIS — M6281 Muscle weakness (generalized): Secondary | ICD-10-CM | POA: Diagnosis not present

## 2013-03-05 DIAGNOSIS — R489 Unspecified symbolic dysfunctions: Secondary | ICD-10-CM | POA: Diagnosis not present

## 2013-03-05 DIAGNOSIS — I10 Essential (primary) hypertension: Secondary | ICD-10-CM | POA: Diagnosis not present

## 2013-03-05 DIAGNOSIS — L8990 Pressure ulcer of unspecified site, unspecified stage: Secondary | ICD-10-CM | POA: Diagnosis not present

## 2013-03-08 ENCOUNTER — Non-Acute Institutional Stay (SKILLED_NURSING_FACILITY): Payer: Medicare Other | Admitting: Family

## 2013-03-08 ENCOUNTER — Encounter: Payer: Self-pay | Admitting: Family

## 2013-03-08 DIAGNOSIS — F319 Bipolar disorder, unspecified: Secondary | ICD-10-CM

## 2013-03-08 DIAGNOSIS — F259 Schizoaffective disorder, unspecified: Secondary | ICD-10-CM

## 2013-03-08 NOTE — Progress Notes (Signed)
Patient ID: [name], female   DOB: 04/08/1941, 72 y.o.   MRN: [id number]  Date: 03/08/13 Facility: [facility]  Code Status:  Full Code  Chief Complaint  Patient presents with  . Hospitalization Follow-up    HPI: Pt is being seen for post-hospitalization follow up. Pt was discharged from [facility], where she was involuntarily committed for acute onset of mania manifested by physically threatening behavior, agitation, yelling and excessive use of profanity. Pt and health care team denies current issues/concerns at present.       Allergies  Allergen Reactions  . Ativan [Lorazepam] Other (See Comments)    Increases fall risk and was told not to take it.  . Benztropine Mesylate Other (See Comments)    Restless legs  . Clonazepam Other (See Comments)    Increases fall risk and was told not to take it.  . Vicodin [Hydrocodone-Acetaminophen] Other (See Comments)    Increases fall risk and was told not to take it.  . Lisinopril Other (See Comments)    cough  . Sulfonamide Derivatives Other (See Comments)    Pt does not remember an allergic reaction to sulfa drugs     Medication List       This list is accurate as of: 03/08/13  4:24 PM.  Always use your most recent med list.               acetaminophen 325 MG tablet  Commonly known as:  TYLENOL  Take 650 mg by mouth every 6 (six) hours as needed for pain.     B-complex with vitamin C tablet  Take 1 tablet by mouth daily.     CALCIUM CITRATE + D3 PO  Take 1 tablet by mouth 2 (two) times daily.     CENTRUM SILVER ADULT 50+ PO  Take 1 tablet by mouth daily.     diltiazem 180 MG 24 hr capsule  Commonly known as:  DILACOR XR  Take 60 mg by mouth 3 (three) times daily.     divalproex 500 MG 24 hr tablet  Commonly known as:  DEPAKOTE ER  Take 1 tablet (500 mg total) by mouth daily.     feeding supplement (PRO-STAT SUGAR FREE 64) Liqd  Take 30 mLs by mouth 3 (three) times daily  with meals.     ferrous sulfate 325 (65 FE) MG tablet  Take 325 mg by mouth 2 (two) times daily with a meal.     FLAXSEED OIL PO  Take 1 capsule by mouth daily.     ICY HOT 7.5 % (ROLL) Misc  Generic drug:  Menthol (Topical Analgesic)  Apply 1 each topically as needed (for pain).     lithium carbonate 300 MG capsule  Take 300 mg by mouth 3 (three) times daily with meals.     magic mouthwash w/lidocaine Soln  Take 2 mLs by mouth 4 (four) times daily as needed.     OMEGA-3 FISH OIL PO  Take 325 mg by mouth 3 (three) times daily.     polyethylene glycol powder powder  Commonly known as:  GLYCOLAX/MIRALAX  Take 17 g by mouth daily as needed.     polyvinyl alcohol 1.4 % ophthalmic solution  Commonly known as:  LIQUIFILM TEARS  Place 1 drop into both eyes as needed. For dry eyes     risperiDONE 0.5 MG tablet  Commonly known as:  RISPERDAL  Take 0.5 mg  by mouth 2 (two) times daily.     vitamin E 400 UNIT capsule  Take 400 Units by mouth at bedtime.     zinc sulfate 220 MG capsule  Take 220 mg by mouth daily. For 60 days         DATA REVIEWED   Laboratory Studies: 01/17/13-WBC 6.5, Hemoglobin 13, Hematocrit 38,1, Plt 183 01/17/13-Na 136, K 5.4, Cl 98, BUN 26, Creatinine 1.31, Calcium 11.4     Past Medical History  Diagnosis Date  . Depression   . Gout   . Bipolar 1 disorder   . Schizoaffective disorder   . Chronic kidney disease   . Hypertension   . Seizure disorder   . Thrombocytopenia   . Anemia      Past Surgical History  Procedure Laterality Date  . Tubal ligation    . Appendectomy       Review of Systems  Constitutional: Negative.   Respiratory: Negative.   Cardiovascular: Negative.   Gastrointestinal: Negative.   Musculoskeletal:       Ambulates with walker  Neurological: Negative.   Psychiatric/Behavioral: Negative.      Physical Exam Filed Vitals:   03/08/13 1606  BP: 120/62  Pulse: 86  Temp: 97.6 F (36.4 C)  Resp: 18   There  is no weight on file to calculate BMI. Physical Exam  Constitutional: She is oriented to person, place, and time. She appears well-developed and well-nourished.  Cardiovascular: Normal rate and regular rhythm.   Pulmonary/Chest: Effort normal.  Neurological: She is alert and oriented to person, place, and time.  Psychiatric: She has a normal mood and affect. Her speech is normal and behavior is normal. Judgment and thought content normal. Cognition and memory are normal.    ASSESSMENT/PLAN Obtain CBC with diff, CMP, TSH, Vitamin D, Valproic Acid on next lab draw Psych Consult-to assist with managing Bi-polar mood disorder and schizoaffective disorder    Follow up:prn

## 2013-03-09 ENCOUNTER — Non-Acute Institutional Stay (SKILLED_NURSING_FACILITY): Payer: Medicare Other | Admitting: Internal Medicine

## 2013-03-09 DIAGNOSIS — K5900 Constipation, unspecified: Secondary | ICD-10-CM

## 2013-03-09 DIAGNOSIS — F319 Bipolar disorder, unspecified: Secondary | ICD-10-CM | POA: Diagnosis not present

## 2013-03-09 DIAGNOSIS — I150 Renovascular hypertension: Secondary | ICD-10-CM | POA: Diagnosis not present

## 2013-03-09 DIAGNOSIS — N183 Chronic kidney disease, stage 3 unspecified: Secondary | ICD-10-CM

## 2013-03-19 ENCOUNTER — Non-Acute Institutional Stay (SKILLED_NURSING_FACILITY): Payer: Medicare Other | Admitting: Internal Medicine

## 2013-03-19 DIAGNOSIS — L8990 Pressure ulcer of unspecified site, unspecified stage: Secondary | ICD-10-CM

## 2013-03-19 DIAGNOSIS — L8992 Pressure ulcer of unspecified site, stage 2: Secondary | ICD-10-CM

## 2013-03-23 NOTE — Progress Notes (Signed)
Patient ID: [name], female   DOB: [birth year], 72 y.o.   MRN: [id number] Facility[facility] Chief complaint: Wound Review History: this is a patient who recently returned from [facility] unit. Unfortunately she returns with wounds on her bilateral buttocks.  Physical Exam: Skin: Roughly 2 inches below her coccyx an involving the surrounding buttocks. This is covered by a fibrous escar however parts of the wound is already cleaning up fairly nicely. The facility has been using Medihoney.   Impression Pressure ulcer stage 2: Dress with Santyl mixed 1:1 with medihoney. Pressure relief will be paramount. I have discussed this with the patient. Further debridement may be necessary.

## 2013-03-25 ENCOUNTER — Non-Acute Institutional Stay (SKILLED_NURSING_FACILITY): Payer: Medicare Other | Admitting: Internal Medicine

## 2013-03-25 DIAGNOSIS — K5900 Constipation, unspecified: Secondary | ICD-10-CM

## 2013-03-25 DIAGNOSIS — R300 Dysuria: Secondary | ICD-10-CM | POA: Diagnosis not present

## 2013-03-25 DIAGNOSIS — F319 Bipolar disorder, unspecified: Secondary | ICD-10-CM | POA: Diagnosis not present

## 2013-03-25 DIAGNOSIS — I10 Essential (primary) hypertension: Secondary | ICD-10-CM

## 2013-03-27 ENCOUNTER — Encounter: Payer: Self-pay | Admitting: Internal Medicine

## 2013-03-27 NOTE — Progress Notes (Signed)
        PROGRESS NOTE  DATE: 03/25/2013   FACILITY: [facility]  LEVEL OF CARE: SNF (31)  Discharge Visit  CHIEF COMPLAINT:  Manage HTN & bipolar disorder  HISTORY OF PRESENT ILLNESS: I was requested by the social worker to perform face-to-face evaluation for discharge:  Patient was admitted to this facility for short-term rehabilitation after the patient's recent hospitalization for bipolar manic phase.  Patient has completed SNF rehabilitation and therapy has cleared the patient for discharge on 04/02/13.  Reassessment of ongoing problem(s):  HTN: Pt 's HTN remains stable.  Denies CP, sob, DOE, pedal edema, headaches, dizziness or visual disturbances.  No complications from the medications currently being used.  Last BP : 128/64.  BIPOLAR DISORDER: The bipolar disorder remains stable.  Patient denies depressive symptoms or manic episodes.  No complications reported from the medications currently being used.  PAST MEDICAL HISTORY : Reviewed.  No changes.  CURRENT MEDICATIONS: Reviewed per [facility]  REVIEW OF SYSTEMS:  GENERAL: no change in appetite, no fatigue, no weight changes, no fever, chills or weakness RESPIRATORY: no cough, SOB, DOE, wheezing, hemoptysis CARDIAC: no chest pain, edema or palpitations GI: no abdominal pain, diarrhea, constipation, heart burn, nausea or vomiting GU: c/o dysuria  PHYSICAL EXAMINATION  VS:  T 97      P  72     RR 16     BP 128/64      POX %       WT (Lb) 148  GENERAL: no acute distress, normal body habitus EYES: conjunctivae normal, sclerae normal, normal eye lids NECK: supple, trachea midline, no neck masses, no thyroid tenderness, no thyromegaly LYMPHATICS: no LAN in the neck, no supraclavicular LAN RESPIRATORY: breathing is even & unlabored, BS CTAB CARDIAC: RRR, no murmur,no extra heart sounds, +1 bilateral LE edema GI: abdomen soft, normal BS, no masses, no tenderness, no hepatomegaly, no splenomegaly PSYCHIATRIC:  the patient is alert & oriented to person, affect & behavior appropriate  LABS/RADIOLOGY:  11-14 iron panel nl, Hb 10, mcv 86 ow cbc nl, cmp nl, TSH 4.42, vit D level 36.6, depakote level 62  ASSESSMENT/PLAN:  HTN-well controlled. Bipolar disorder-stable. Dysuria-new problem.  Check UA, Cx & S. Constipation-well controlled. Anemia of chronic dz-cont. Fe. Sz d/o-well controlled.  I have filled out patient's discharge paperwork and written prescriptions.  Patient will receive home health PT, OT, nursing and SW. DME provided: [employer]ator (781.2, 728.87)  Total discharge time: Greater than 30 minutes Discharge time involved coordination of the discharge process with social worker, nursing staff and therapy department. Medical justification for home health services/DME verified.  CPT CODE: 16109

## 2013-04-02 DIAGNOSIS — M6281 Muscle weakness (generalized): Secondary | ICD-10-CM | POA: Diagnosis not present

## 2013-04-02 DIAGNOSIS — L89109 Pressure ulcer of unspecified part of back, unspecified stage: Secondary | ICD-10-CM | POA: Diagnosis not present

## 2013-04-05 ENCOUNTER — Ambulatory Visit: Payer: Self-pay | Admitting: Family Medicine

## 2013-04-05 ENCOUNTER — Telehealth: Payer: Self-pay | Admitting: Family Medicine

## 2013-04-05 DIAGNOSIS — M6281 Muscle weakness (generalized): Secondary | ICD-10-CM | POA: Diagnosis not present

## 2013-04-05 DIAGNOSIS — L89109 Pressure ulcer of unspecified part of back, unspecified stage: Secondary | ICD-10-CM | POA: Diagnosis not present

## 2013-04-05 NOTE — Telephone Encounter (Signed)
[name] with [facility] nurses states he will be providing home health for pt

## 2013-04-06 ENCOUNTER — Telehealth: Payer: Self-pay | Admitting: Family Medicine

## 2013-04-06 DIAGNOSIS — M6281 Muscle weakness (generalized): Secondary | ICD-10-CM | POA: Diagnosis not present

## 2013-04-06 DIAGNOSIS — L89109 Pressure ulcer of unspecified part of back, unspecified stage: Secondary | ICD-10-CM | POA: Diagnosis not present

## 2013-04-06 NOTE — Telephone Encounter (Signed)
Consuello Closs calls with orders for Occupational Therapy 2x a week for 1 week and then 1x a week for 1 week for IADL training and home exercise program. Please call Dorinda Kenny Stern at [phone] with questions.

## 2013-04-06 NOTE — Telephone Encounter (Signed)
Please advise. [name]  

## 2013-04-07 DIAGNOSIS — L89109 Pressure ulcer of unspecified part of back, unspecified stage: Secondary | ICD-10-CM | POA: Diagnosis not present

## 2013-04-07 DIAGNOSIS — M6281 Muscle weakness (generalized): Secondary | ICD-10-CM | POA: Diagnosis not present

## 2013-04-10 DIAGNOSIS — L89109 Pressure ulcer of unspecified part of back, unspecified stage: Secondary | ICD-10-CM | POA: Diagnosis not present

## 2013-04-10 DIAGNOSIS — M6281 Muscle weakness (generalized): Secondary | ICD-10-CM | POA: Diagnosis not present

## 2013-04-12 ENCOUNTER — Ambulatory Visit: Payer: Medicare Other | Admitting: Family Medicine

## 2013-04-12 DIAGNOSIS — M6281 Muscle weakness (generalized): Secondary | ICD-10-CM | POA: Diagnosis not present

## 2013-04-12 DIAGNOSIS — L89109 Pressure ulcer of unspecified part of back, unspecified stage: Secondary | ICD-10-CM | POA: Diagnosis not present

## 2013-04-13 DIAGNOSIS — M6281 Muscle weakness (generalized): Secondary | ICD-10-CM | POA: Diagnosis not present

## 2013-04-13 DIAGNOSIS — L89109 Pressure ulcer of unspecified part of back, unspecified stage: Secondary | ICD-10-CM | POA: Diagnosis not present

## 2013-04-15 DIAGNOSIS — IMO0001 Reserved for inherently not codable concepts without codable children: Secondary | ICD-10-CM

## 2013-04-15 HISTORY — DX: Reserved for inherently not codable concepts without codable children: IMO0001

## 2013-04-17 DIAGNOSIS — M6281 Muscle weakness (generalized): Secondary | ICD-10-CM | POA: Diagnosis not present

## 2013-04-17 DIAGNOSIS — L89109 Pressure ulcer of unspecified part of back, unspecified stage: Secondary | ICD-10-CM | POA: Diagnosis not present

## 2013-04-19 ENCOUNTER — Ambulatory Visit (INDEPENDENT_AMBULATORY_CARE_PROVIDER_SITE_OTHER): Payer: Medicare Other | Admitting: Family Medicine

## 2013-04-19 VITALS — BP 132/81 | HR 97 | Temp 99.4°F | Ht 65.0 in | Wt 147.0 lb

## 2013-04-19 DIAGNOSIS — B86 Scabies: Secondary | ICD-10-CM | POA: Diagnosis not present

## 2013-04-19 DIAGNOSIS — F319 Bipolar disorder, unspecified: Secondary | ICD-10-CM | POA: Diagnosis not present

## 2013-04-19 MED ORDER — PERMETHRIN 5 % EX CREA: 1.0000 "application " | TOPICAL_CREAM | Freq: Once | CUTANEOUS | Status: DC

## 2013-04-19 NOTE — Progress Notes (Signed)
Family Medicine Office Visit Note   Subjective:   Patient ID: [name], female  DOB: 16-Oct-1940, 73 y.o.. MRN: [id number]   This is my first time seen [name]. She comes today complaining of rash she had noticed since [facility]  Pt reports she was [facility] after "losing her mind". She then was transferred to SNF for rehab when she found out that Lithium was restarted. Pt reports was on this medication a long time ago and stopped due to doing not good with her psychiatric ilness. Pt has not f/u with her Psychiatrist Dr. Casimiro Needle after this hospitalization and medication regimen changed.  Regarding her current symptoms she reports noticing this rash at discharge from SNF. Rash was originally on her thighs and gluteus but now has worsened and appeared in arms, interdigital areas and back. Rash is reported to be very pruriginous at night. Denies fever, chills, nausea, vomiting, headaches, changes on her appetite, urine or BM.  Her PMHx is significant for: HTN, CKD stage III (Cr baseline ~1.2), Gout,  HLD, Obesity, schizoafective disorder and Bipolar, seizures. Controlled on Risperidone, Depakote and recently started on Lithium.   Review of Systems:  Pt denies SOB, chest pain, palpitations, headaches, dizziness, numbness, weakness, nausea, vomiting or other GI symptom.   Objective:   Physical Exam: Gen:  NAD. Walks assisted with walker, but able to undress/dress and tie shoes without help. HEENT: Moist mucous membranes  CV: Regular rate and rhythm, no murmurs rubs or gallops PULM: Clear to auscultation bilaterally. No wheezes or rales. EXT: No edema Neuro: Alert and oriented x3. No focalization Pshyc: flat affect and normal mood. Normal speech, no agitation, no hallucinations. SKIN: papular lesions on thighs, gluteus, back, arms and interdigital areas. Excoriations present mostly on her arms.  No vesicles, no mucosal involvement.   Assessment & Plan:

## 2013-04-19 NOTE — Patient Instructions (Addendum)
Scabies Scabies are small bugs (mites) that burrow under the skin and cause red bumps and severe itching. These bugs can only be seen with a microscope. Scabies are highly contagious. They can spread easily from person to person by direct contact. They are also spread through sharing clothing or linens that have the scabies mites living in them. It is not unusual for an entire family to become infected through shared towels, clothing, or bedding.  HOME CARE INSTRUCTIONS   Your caregiver may prescribe a cream or lotion to kill the mites. If cream is prescribed, massage the cream into the entire body from the neck to the bottom of both feet. Also massage the cream into the scalp and face if your child is less than 81 year old. Avoid the eyes and mouth. Do not wash your hands after application.  Leave the cream on for 8 to 12 hours. Your child should bathe or shower after the 8 to 12 hour application period. Sometimes it is helpful to apply the cream to your child right before bedtime.  One treatment is usually effective and will eliminate approximately 95% of infestations. For severe cases, your caregiver may decide to repeat the treatment in 1 week. Everyone in your household should be treated with one application of the cream.  New rashes or burrows should not appear within 24 to 48 hours after successful treatment. However, the itching and rash may last for 2 to 4 weeks after successful treatment. Your caregiver may prescribe a medicine to help with the itching or to help the rash go away more quickly.  Scabies can live on clothing or linens for up to 3 days. All of your child's recently used clothing, towels, stuffed toys, and bed linens should be washed in hot water and then dried in a dryer for at least 20 minutes on high heat. Items that cannot be washed should be enclosed in a plastic bag for at least 3 days.  To help relieve itching, bathe your child in a cool bath or apply cool washcloths to the  affected areas.  Your child may return to school after treatment with the prescribed cream. SEEK MEDICAL CARE IF:   The itching persists longer than 4 weeks after treatment.  The rash spreads or becomes infected. Signs of infection include red blisters or yellow-tan crust.

## 2013-04-20 DIAGNOSIS — M6281 Muscle weakness (generalized): Secondary | ICD-10-CM | POA: Diagnosis not present

## 2013-04-20 DIAGNOSIS — B86 Scabies: Secondary | ICD-10-CM | POA: Insufficient documentation

## 2013-04-20 DIAGNOSIS — L89109 Pressure ulcer of unspecified part of back, unspecified stage: Secondary | ICD-10-CM | POA: Diagnosis not present

## 2013-04-20 NOTE — Assessment & Plan Note (Signed)
Skin lesions are consistent with this condition. Permethrin cream prescribed and discussed with pt rest of treatment that includes clothing hygiene. Pt has an aid that helps her with her laundry, information printed and given to pt to provide to her aid as well. F/u in 1-2 weeks or sooner. Discussed signs of worsening condition that should prompt re-evaluation.

## 2013-04-20 NOTE — Assessment & Plan Note (Addendum)
Recent hospitalization. Restarted on Lithium despite her kidney function. No signs of acute lithium toxicity today. Pt declines labs today but will be reasonable to obtain lithium level, as well as Cr. (this needs to be monitored every 2-3 months in the first six months) F/u with Psychiatry is strongly recommended.

## 2013-04-22 ENCOUNTER — Encounter: Payer: Self-pay | Admitting: Internal Medicine

## 2013-04-22 DIAGNOSIS — K5900 Constipation, unspecified: Secondary | ICD-10-CM | POA: Insufficient documentation

## 2013-04-22 NOTE — Progress Notes (Signed)
Patient ID: [name], female   DOB: October 30, 1940, 73 y.o.   MRN: [id number]        HISTORY & PHYSICAL  DATE: 03/09/2013     FACILITY: [facility]  LEVEL OF CARE: SNF (31)  ALLERGIES:  Allergies  Allergen Reactions  . Ativan [Lorazepam] Other (See Comments)    Increases fall risk and was told not to take it.  . Benztropine Mesylate Other (See Comments)    Restless legs  . Clonazepam Other (See Comments)    Increases fall risk and was told not to take it.  . Vicodin [Hydrocodone-Acetaminophen] Other (See Comments)    Increases fall risk and was told not to take it.  . Lisinopril Other (See Comments)    cough  . Sulfonamide Derivatives Other (See Comments)    Pt does not remember an allergic reaction to sulfa drugs    CHIEF COMPLAINT:  Manage bipolar disorder, chronic kidney disease, and renovascular hypertension.  HISTORY OF PRESENT ILLNESS:  The patient is a 73 year-old, African-American female who was hospitalized secondary to extremely manic behavior.  After hospitalization, she is admitted to this facility for short-term rehabilitation.  She has the following problems:    HTN: Pt 's HTN remains stable.  Denies CP, sob, DOE, pedal edema, headaches, dizziness or visual disturbances.  No complications from the medications currently being used.  Last BP :   134/68.    CHRONIC KIDNEY DISEASE: The patient's chronic kidney disease remains stable.  Patient denies increasing lower extremity swelling or confusion. Last BUN and creatinine are:   BUN 11, creatinine 0.8.  BIPOLAR DISORDER: The bipolar disorder remains stable.  Patient denies depressive symptoms or manic episodes.  No complications reported from the medications currently being used.    PAST MEDICAL HISTORY :  Past Medical History  Diagnosis Date  . Depression   . Gout   . Bipolar 1 disorder   . Schizoaffective disorder   . Chronic kidney disease   . Hypertension   . Seizure disorder   .  Thrombocytopenia   . Anemia     PAST SURGICAL HISTORY: Past Surgical History  Procedure Laterality Date  . Tubal ligation    . Appendectomy     SOCIAL HISTORY:  reports that she has never smoked. She has never used smokeless tobacco. She reports that she does not drink alcohol or use illicit drugs.  FAMILY HISTORY: None  CURRENT MEDICATIONS: Reviewed per MAR  REVIEW OF SYSTEMS:   GU:  Complaints of dysuria.    See HPI otherwise 14 point ROS is negative.  PHYSICAL EXAMINATION  VS:  T 98.7       P 92      RR 18      BP 134/68       GENERAL: no acute distress, normal body habitus EYES: conjunctivae normal, sclerae normal, normal eye lids MOUTH/THROAT: lips without lesions,no lesions in the mouth,tongue is without lesions,uvula elevates in midline NECK: supple, trachea midline, no neck masses, no thyroid tenderness, no thyromegaly LYMPHATICS: no LAN in the neck, no supraclavicular LAN RESPIRATORY: breathing is even & unlabored, BS CTAB CARDIAC: RRR, no murmur,no extra heart sounds, no edema GI:  ABDOMEN: abdomen soft, normal BS, no masses, no tenderness  LIVER/SPLEEN: no hepatomegaly, no splenomegaly MUSCULOSKELETAL: HEAD: normal to inspection & palpation BACK: no kyphosis, scoliosis or spinal processes tenderness EXTREMITIES: LEFT UPPER EXTREMITY: full range of motion, normal strength & tone RIGHT UPPER EXTREMITY:  full range of  motion, normal strength & tone LEFT LOWER EXTREMITY:  full range of motion, normal strength & tone RIGHT LOWER EXTREMITY:  full range of motion, normal strength & tone PSYCHIATRIC: the patient is alert & oriented to person, affect & behavior appropriate  LABS/RADIOLOGY: WBC 7.2, hemoglobin 9.8.    Sodium 139, potassium 4.3, BUN 11, creatinine 0.8.    CT:  No acute change.    Labs reviewed: Basic Metabolic Panel:  Recent Labs  01/13/13 0450 01/17/13 0450 01/25/13 1748  NA 139 138 137  K 4.2 4.0 3.6  CL 105 103 103  CO2 25 24 24    GLUCOSE 97 99 112*  BUN 17 18 14   CREATININE 1.06 1.06 1.14*  CALCIUM 9.2 9.9 10.3   Liver Function Tests:  Recent Labs  01/13/13 0450 01/17/13 0450 01/25/13 1748  AST 114* 17 20  ALT 61* 27 15  ALKPHOS 48 43 51  BILITOT 0.3 0.3 0.2*  PROT 6.1 6.4 6.9  ALBUMIN 2.8* 2.5* 3.1*    Recent Labs  01/12/13 0700  AMMONIA 24   CBC:  Recent Labs  05/22/12 1950 07/16/12 0204 07/29/12 0718  01/13/13 0450 01/17/13 0450 01/25/13 1748  WBC 5.1 5.1 4.9  < > 7.1 7.3 6.3  NEUTROABS 2.4 1.9 2.2  --   --   --   --   HGB 11.8* 11.7* 10.9*  < > 11.0* 11.1* 10.2*  HCT 35.1* 34.4* 31.5*  < > 32.1* 32.7* 31.2*  MCV 84.2 83.5 82.9  < > 82.3 82.2 84.1  PLT 128* 133* 119*  < > 158 174 247  < > = values in this interval not displayed.  Cardiac Enzymes:  Recent Labs  05/22/12 2305 07/29/12 0718 07/29/12 0925  TROPONINI <0.30 <0.30 <0.30   ASSESSMENT/PLAN:  Chronic kidney disease.  Stable.    Renovascular hypertension.  Well controlled.     Bipolar disorder.  Continue current medications.    Constipation.  Continue MiraLAX.    Chronic anemia.  Continue iron.    Dysuria.  New problem.  Check UA, culture and sensitivities.     THN Metrics:   Nonsmoker.  Blood pressure:  134/68.  Not on aspirin.    I have reviewed patient's medical records received at admission/from hospitalization.  CPT CODE: 10175

## 2013-04-23 ENCOUNTER — Other Ambulatory Visit: Payer: Self-pay | Admitting: Family Medicine

## 2013-04-23 DIAGNOSIS — L89109 Pressure ulcer of unspecified part of back, unspecified stage: Secondary | ICD-10-CM | POA: Diagnosis not present

## 2013-04-23 DIAGNOSIS — M6281 Muscle weakness (generalized): Secondary | ICD-10-CM | POA: Diagnosis not present

## 2013-04-26 ENCOUNTER — Telehealth: Payer: Self-pay | Admitting: Family Medicine

## 2013-04-26 ENCOUNTER — Other Ambulatory Visit: Payer: Self-pay | Admitting: Cardiology

## 2013-04-26 DIAGNOSIS — L89109 Pressure ulcer of unspecified part of back, unspecified stage: Secondary | ICD-10-CM | POA: Diagnosis not present

## 2013-04-26 DIAGNOSIS — M6281 Muscle weakness (generalized): Secondary | ICD-10-CM | POA: Diagnosis not present

## 2013-04-26 NOTE — Telephone Encounter (Signed)
Since stating the lithium, she states she constantly sleepy and slowed down. She also states the she has been dx with scabies and is being treated for that presently.

## 2013-04-26 NOTE — Telephone Encounter (Signed)
Patient was unable to explain exactly what she was needing,I did explain that if she was having problem with the prescription lithium she would need to contact the prescribing Dr and if she was having eye problem to schedule an appt with dr Rosary Lively stated she had another call she need to get to and hung up. Constantinos Krempasky, Lewie Loron

## 2013-04-27 DIAGNOSIS — L89109 Pressure ulcer of unspecified part of back, unspecified stage: Secondary | ICD-10-CM | POA: Diagnosis not present

## 2013-04-27 DIAGNOSIS — M6281 Muscle weakness (generalized): Secondary | ICD-10-CM | POA: Diagnosis not present

## 2013-05-05 ENCOUNTER — Ambulatory Visit (INDEPENDENT_AMBULATORY_CARE_PROVIDER_SITE_OTHER): Payer: Medicare Other | Admitting: Family Medicine

## 2013-05-05 ENCOUNTER — Encounter: Payer: Self-pay | Admitting: Family Medicine

## 2013-05-05 VITALS — BP 132/76 | HR 84 | Ht 65.0 in | Wt 147.8 lb

## 2013-05-05 DIAGNOSIS — B86 Scabies: Secondary | ICD-10-CM | POA: Diagnosis not present

## 2013-05-05 DIAGNOSIS — J029 Acute pharyngitis, unspecified: Secondary | ICD-10-CM

## 2013-05-05 NOTE — Patient Instructions (Addendum)
Your lesions in the skin have resolved. You can use moisturizer lotion after baths to keep you skin smooth. For your sore throat you can use gargles with salt-water and take tylenol as needed. If you develop fever, cough,general malaise or other concerning symptoms please come back to get re-evaluated.

## 2013-05-06 DIAGNOSIS — J029 Acute pharyngitis, unspecified: Secondary | ICD-10-CM | POA: Insufficient documentation

## 2013-05-06 NOTE — Assessment & Plan Note (Signed)
Mild. reassuring physical exam. Symptomatic treatment with gargles and tylenol if needed. Discussed signs of worsening condition that should prompt re-evaluation. F/u as scheduled.

## 2013-05-06 NOTE — Assessment & Plan Note (Signed)
Resolved. No further tx needed at this time.

## 2013-05-06 NOTE — Progress Notes (Signed)
Family Medicine Office Visit Note   Subjective:   Patient ID: [name], female  DOB: 1941/03/21, 73 y.o.. MRN: [id number]   Pt that comes today to f/u recent diagnosis of scabies and new onset of sore throat.  Scabies f/u: pt reports completed 2 courses of Permethrine cream and her symptoms have subsided completely. She also followed recommendations regarding clothing and hygiene. She has no complaint in that regards.  Sore throat: pt reports hx of allergy and postnasal dripping, now with mild sore throat, but denies fever, chills, general malaise, cough, nausea, vomiting or headaches.    Review of Systems:  Pt denies SOB, chest pain, palpitations, numbness or weakness. No changes on urinary or BM habits. No unintentional weigh loss/gain.  Objective:   Physical Exam: Gen:  NAD HEENT: Moist mucous membranes. Oropharynx: minimal erythema no exudates. Neck: supple, no adenopathies. Ears: normal ear canal and TM bilaterally..   CV: Regular rate and rhythm, no murmurs rubs or gallops PULM: Clear to auscultation bilaterally. No wheezes/rales/rhonchi EXT: No edema Neuro: Alert and oriented x3. No focalization Skin: no skin lesions. Psych: flat affect, normal language, no agitation, no hallucinations. No suicidal ideation/plan  Assessment & Plan:

## 2013-05-10 ENCOUNTER — Encounter: Payer: Self-pay | Admitting: Cardiology

## 2013-05-14 DIAGNOSIS — F319 Bipolar disorder, unspecified: Secondary | ICD-10-CM | POA: Diagnosis not present

## 2013-05-17 ENCOUNTER — Ambulatory Visit: Payer: Medicare Other | Admitting: Cardiology

## 2013-05-17 ENCOUNTER — Telehealth: Payer: Self-pay | Admitting: Cardiology

## 2013-05-17 NOTE — Telephone Encounter (Signed)
New message        Pt wants to know if she has to fast when she comes in to see you for an office visit

## 2013-05-17 NOTE — Telephone Encounter (Signed)
LVM for pt to make aware.

## 2013-05-20 ENCOUNTER — Encounter: Payer: Self-pay | Admitting: Family Medicine

## 2013-05-20 ENCOUNTER — Telehealth: Payer: Self-pay | Admitting: Family Medicine

## 2013-05-20 NOTE — Telephone Encounter (Signed)
Pt is calling and would like [place] to fax a letter stating that she was in our office on the following dates 01/05 and 01/21. Please fax this to [phone]. [email or web address]

## 2013-05-20 NOTE — Telephone Encounter (Signed)
Letter written and faxed.

## 2013-06-03 DIAGNOSIS — M79609 Pain in unspecified limb: Secondary | ICD-10-CM | POA: Diagnosis not present

## 2013-06-03 DIAGNOSIS — B351 Tinea unguium: Secondary | ICD-10-CM | POA: Diagnosis not present

## 2013-06-07 ENCOUNTER — Encounter: Payer: Self-pay | Admitting: Cardiology

## 2013-06-07 ENCOUNTER — Ambulatory Visit (INDEPENDENT_AMBULATORY_CARE_PROVIDER_SITE_OTHER): Payer: Medicare Other | Admitting: Cardiology

## 2013-06-07 ENCOUNTER — Ambulatory Visit: Payer: Medicare Other | Admitting: Cardiology

## 2013-06-07 VITALS — BP 137/70 | HR 81 | Ht 65.0 in | Wt 147.0 lb

## 2013-06-07 DIAGNOSIS — I10 Essential (primary) hypertension: Secondary | ICD-10-CM

## 2013-06-07 NOTE — Progress Notes (Signed)
[place], [address] [place], [place]  [address] Phone: [phone] Fax:  [phone]  Date:  06/07/2013   ID:  [name], DOB 03-17-1941, MRN [id number]  PCP:  Gwendolyn Fill, MD  Cardiologist:  Fransico Him, MD     History of Present Illness: [name] is a 73 y.o. female with a history of HTN presents today for followup.  She is doing well.  She denies any chest pain, SOB, DOE,  dizziness, palpitations or syncope. She has chronic LE edema that is stable.     Wt Readings from Last 3 Encounters:  06/07/13 147 lb (66.679 kg)  05/05/13 147 lb 12.8 oz (67.042 kg)  04/19/13 147 lb (66.679 kg)     Past Medical History  Diagnosis Date  . Depression   . Gout   . Bipolar 1 disorder   . Schizoaffective disorder   . Chronic kidney disease   . Hypertension   . Seizure disorder   . Thrombocytopenia   . Anemia     Current Outpatient Prescriptions  Medication Sig Dispense Refill  . acetaminophen (TYLENOL) 325 MG tablet Take 650 mg by mouth every 6 (six) hours as needed for pain.      . Calcium Citrate-Vitamin D (CALCIUM CITRATE + D3 PO) Take 1 tablet by mouth 2 (two) times daily.      [name] diltiazem (CARDIZEM) 60 MG tablet TAKE 1 TABLET BY MOUTH THREE TIMES DAILY  90 tablet  1  . divalproex (DEPAKOTE ER) 500 MG 24 hr tablet Take 1 tablet (500 mg total) by mouth daily.  30 tablet  0  . ferrous sulfate 325 (65 FE) MG tablet Take 325 mg by mouth 2 (two) times daily with a meal.      . lithium carbonate 300 MG capsule Take 300 mg by mouth 3 (three) times daily with meals.      . Menthol, Topical Analgesic, (ICY HOT) 7.5 % (ROLL) MISC Apply 1 each topically as needed (for pain).      . Multiple Vitamins-Minerals (CENTRUM SILVER ADULT 50+ PO) Take 1 tablet by mouth daily.      . permethrin (ELIMITE) 5 % cream Apply 1 application topically once. Apply, leave for 8-14 hours then wash with warm water. Re-apply in one week  60 g  1  . polyethylene glycol powder (GLYCOLAX/MIRALAX)  powder TAKE 17 GRAMS WITH LIQUID BY MOUTH EVERY DAY AS NEEDED  527 g  0  . polyvinyl alcohol (LIQUIFILM TEARS) 1.4 % ophthalmic solution Place 1 drop into both eyes as needed. For dry eyes      . risperiDONE (RISPERDAL) 0.5 MG tablet Take 0.5 mg by mouth 2 (two) times daily.       No current facility-administered medications for this visit.    Allergies:    Allergies  Allergen Reactions  . Ativan [Lorazepam] Other (See Comments)    Increases fall risk and was told not to take it.  . Benztropine Mesylate Other (See Comments)    Restless legs  . Clonazepam Other (See Comments)    Increases fall risk and was told not to take it.  . Vicodin [Hydrocodone-Acetaminophen] Other (See Comments)    Increases fall risk and was told not to take it.  . Lisinopril Other (See Comments)    cough  . Sulfonamide Derivatives Other (See Comments)    Pt does not remember an allergic reaction to sulfa drugs    Social History:  The patient  reports that  she has never smoked. She has never used smokeless tobacco. She reports that she does not drink alcohol or use illicit drugs.   Family History:  The patient's family history includes Heart attack in her father and mother; Heart disease in her father and mother; Hypertension in her brother.   ROS:  Please see the history of present illness.      All other systems reviewed and negative.   PHYSICAL EXAM: VS:  BP 137/70  Pulse 81  Ht 5\' 5"  (1.651 m)  Wt 147 lb (66.679 kg)  BMI 24.46 kg/m2 Well nourished, well developed, in no acute distress HEENT: normal Neck: no JVD Cardiac:  normal S1, S2; RRR; no murmur Lungs:  clear to auscultation bilaterally, no wheezing, rhonchi or rales Abd: soft, nontender, no hepatomegaly Ext: no edema Skin: warm and dry Neuro:  CNs 2-12 intact, no focal abnormalities noted       ASSESSMENT AND PLAN:  1. HTN - well controlled  - continue diltiazem  Followup with me in 1 year  Signed, Fransico Him, MD 06/07/2013  2:57 PM

## 2013-06-07 NOTE — Patient Instructions (Signed)
Your physician recommends that you continue on your current medications as directed. Please refer to the Current Medication list given to you today. Your physician wants you to follow-up in: 12 months with Dr. Turner.  You will receive a reminder letter in the mail two months in advance. If you don't receive a letter, please call our office to schedule the follow-up appointment.  

## 2013-06-15 DIAGNOSIS — F319 Bipolar disorder, unspecified: Secondary | ICD-10-CM | POA: Diagnosis not present

## 2013-06-18 DIAGNOSIS — F319 Bipolar disorder, unspecified: Secondary | ICD-10-CM | POA: Diagnosis not present

## 2013-06-22 ENCOUNTER — Other Ambulatory Visit: Payer: Self-pay | Admitting: Cardiology

## 2013-07-02 ENCOUNTER — Ambulatory Visit (INDEPENDENT_AMBULATORY_CARE_PROVIDER_SITE_OTHER): Payer: Medicare Other | Admitting: Family Medicine

## 2013-07-02 ENCOUNTER — Encounter: Payer: Self-pay | Admitting: Family Medicine

## 2013-07-02 VITALS — BP 123/51 | HR 80 | Temp 99.3°F | Ht 65.0 in | Wt 144.0 lb

## 2013-07-02 DIAGNOSIS — IMO0002 Reserved for concepts with insufficient information to code with codable children: Secondary | ICD-10-CM | POA: Diagnosis not present

## 2013-07-02 DIAGNOSIS — M62838 Other muscle spasm: Secondary | ICD-10-CM | POA: Insufficient documentation

## 2013-07-02 DIAGNOSIS — S76019A Strain of muscle, fascia and tendon of unspecified hip, initial encounter: Secondary | ICD-10-CM | POA: Insufficient documentation

## 2013-07-02 MED ORDER — CYCLOBENZAPRINE HCL 5 MG PO TABS: 2.5000 mg | ORAL_TABLET | Freq: Every evening | ORAL | Status: DC | PRN

## 2013-07-02 NOTE — Progress Notes (Signed)
   Subjective:    Patient ID: [name], female    DOB: [birth year], 73 y.o.   MRN: [id number]  HPI: Pt presents to clinic for SDA for right hip / leg pain for 2-3 days. Pt reports she was doing leg exercises at home for three straight days earlier this week, and then the day after the third day, she woke with pain. Her pain is described as 10/10, worse with forward flexion, in her groin and around the side to her hip, and into her back. The pain is an aching pain, worse with movement and sitting. She can't easily raise her foot to put on her pants or shoes. She did not fall or have any specific injury. She does state that she had some similar pain during exercise on the third day, but she did not stop. She has taken Norco left over from a tooth extraction, which didn't help the pain very much; she states they were "too strong," and made her feel uncomfortable with palpitations. She has not taken or done anything else to help the pain. She has never had pain like this before.  Review of Systems: As above. Denies fever / chills, change in bowel / bladder habits (does use adult pull-ups, which is not new), chest pain, SOB, or definite new numbness / tingling in her feet.     Objective:   Physical Exam Gen: well-appearing elderly female, walks with assistance of rolling walker HEENT: [place]/AT, PERRLA, EOMI Cardio: RRR, no murmur Pulm: CTAB, no wheezes, speaks in full sentences MSK:   Marked tenderness over anterior groin / hip fold back around toward ala of sacrum on right  Marked tenderness over posterior pelvis bony prominences and soft tissue  Mild-moderate paraspinal muscle spasm/tenderness, L > R in low-lumbar region  Full ROM to bilateral hips actively, though very slow due to anticipation of pain  Pain worst / increased especially with passive internal rotation of right hip  Negative sitting straight leg lift test, bilaterally Neurovascular:  Strength 5/5 in LE bilaterally, distal sensation  grossly intact  Distal LE pulses intact / symmetric     Assessment & Plan:

## 2013-07-02 NOTE — Patient Instructions (Signed)
Thank you for coming in, today!  I think you strained some muscles exercising. I want you to rest for the next several days. Do gentle stretching once or twice a day, for 10-15 minutes at a time. You can take Tylenol for any pain. I will prescribe you some muscle relaxer Flexeril to take ONLY at bedtime. Take HALF of one tablet at bedtime. It may make you very sleepy.  Come back to see Dr. Thomes Dinning in about 2 weeks, or sooner if you need. Please feel free to call with any questions or concerns at any time, at [phone]. --Dr. Venetia Maxon

## 2013-07-02 NOTE — Assessment & Plan Note (Signed)
Likely muscle strain of hip musculature and low back from over-exercise; see separate problem list note. Generally recommended supportive care. Rx for Flexeril, HALF of one 5 mg tablet only, qHS PRN. Advised of expected side effects of somnolence, etc. F/u in 2 weeks with PCP, Dr. Thomes Dinning.

## 2013-07-02 NOTE — Assessment & Plan Note (Signed)
A: Likely hip muscle strain secondary to overwork during exercise in the last week. Component of muscle spasm, likely secondary from same; see separate problem list note. No gross sensory abnormality and pt able to walk slowly, with antalgic gait.  P: Tylenol for pain; recommended stopping use of Norco to prevent balance / oversomnolence problems. Recommended gentle stretching but avoidance of strenuous exercise for the next 1-2 weeks. Also recommended ice, rest, but to avoid keeping completely still to prevent worse muscle spasm and pain. Defer imaging, for now. F/u in about 2 weeks with Dr. Thomes Dinning. If no better or if worse, would recommend plain films of hip / sacrum.

## 2013-07-04 ENCOUNTER — Telehealth: Payer: Self-pay | Admitting: Family Medicine

## 2013-07-04 NOTE — Telephone Encounter (Signed)
[facility] Emergency Line  Patient is calling with muscle spasms. Saw Dr Venetia Maxon Friday who prescribed generic brand for flexeril to take 1/2 tab at bedtime. Needs something to take during day. Unsure if flexeril works at night. Gets up nighttime and still has spasms. Has not been using tylenol. Pain is so bad that it is hard to walk. Denies weakness, incontinence, or perineal numbness. No one is home with her today but someone will be tomorrow. She does not know if she can call someone to come there today. She is tearful. - Advised 1/2 tab flexeril today with food, advised of SE and not to drive. - Also advised tylenol as Dr Venetia Maxon had recommended, and ice pack/heat therapy. - Patient needs to f/u in clinic tomorrow as sameday appt given pain is not well-controlled. When she became tearful, I offered her to go to ED right now given intractable symptoms (call 911 or get friend to drive her to ED or UC; do not drive if uncontrolled pain or after flexeril) or if pain became better controlled, to wait until tomorrow AM.  - Advised against narcotic along with flexeril given risk of obtundation or fall in elderly. - ED return precautions reviewed. - Pt voiced understanding.   Hilton Sinclair, MD

## 2013-07-07 ENCOUNTER — Telehealth: Payer: Self-pay | Admitting: Family Medicine

## 2013-07-07 DIAGNOSIS — M25569 Pain in unspecified knee: Secondary | ICD-10-CM

## 2013-07-07 MED ORDER — DICLOFENAC SODIUM 75 MG PO TBEC: 75.0000 mg | DELAYED_RELEASE_TABLET | Freq: Two times a day (BID) | ORAL | Status: DC

## 2013-07-07 NOTE — Telephone Encounter (Signed)
Pt called with complaining about knee pain. She said it was swelling and painful and not relieved by the flexeril prescribed by Dr. Venetia Maxon at her last visit. She also has not come to the office for evaluation as recommended by Dr. Dianah Field three days ago due to transportation issues. The patient states that she thinks the pain and swelling is due to gout. I told her that I would prescribed voltaren to help with the inflammation, but stated that she needs to be seen in the office. The patient asked if she could be seen in the ED. I said that if the pain is not tolerable at home, then she can come to the ED or urgent care. Numerous questions answered for the patient and when I tried to conclude the conversation, she hung up.

## 2013-07-13 ENCOUNTER — Other Ambulatory Visit: Payer: Self-pay | Admitting: Family Medicine

## 2013-07-26 ENCOUNTER — Encounter: Payer: Self-pay | Admitting: Family Medicine

## 2013-07-26 ENCOUNTER — Ambulatory Visit (INDEPENDENT_AMBULATORY_CARE_PROVIDER_SITE_OTHER): Payer: Medicare Other | Admitting: Family Medicine

## 2013-07-26 VITALS — BP 134/53 | HR 77 | Temp 98.3°F | Ht 65.0 in | Wt 140.0 lb

## 2013-07-26 DIAGNOSIS — D649 Anemia, unspecified: Secondary | ICD-10-CM | POA: Diagnosis not present

## 2013-07-26 DIAGNOSIS — S76019A Strain of muscle, fascia and tendon of unspecified hip, initial encounter: Secondary | ICD-10-CM

## 2013-07-26 DIAGNOSIS — IMO0002 Reserved for concepts with insufficient information to code with codable children: Secondary | ICD-10-CM

## 2013-07-26 DIAGNOSIS — I10 Essential (primary) hypertension: Secondary | ICD-10-CM

## 2013-07-26 LAB — POCT HEMOGLOBIN: HEMOGLOBIN: 11.8 g/dL — AB (ref 12.2–16.2)

## 2013-07-26 NOTE — Assessment & Plan Note (Addendum)
Asymptomatic today P/ Discontinue oral therapy with Flexeryl  Topical Acy-HOT PRN  F/u as needed.

## 2013-07-26 NOTE — Assessment & Plan Note (Signed)
Hx of anemia in the past. On Iron and Zinc since October 2014.  Will repeat Hb if anemia persists will recheck iron studies.

## 2013-07-26 NOTE — Assessment & Plan Note (Signed)
Controlled today. No changes in plan.

## 2013-07-26 NOTE — Progress Notes (Signed)
Family Medicine Office Visit Note   Subjective:   Patient ID: [name], female  DOB: [birth year], 73 y.o.. MRN: [id number]   Pt that comes today for f/u recent right ankle and hip pain. She reports no pain, swelling, bruises or other symptoms. Pt is still taking Flexeryl and NSAIDs as prescribed as well as using Icy-Hot topically. She feels better and denies other concerns.   She also had questions about  Iron and Zinc she is taking since she was discharged from [facility] (SNF). She has been taking them as prescribed and wondered why and if she needs to continue them.  Pt f/u with psychiatry and will have physicotherapy session tomorrow pm and consult on the 14 next month.   Review of Systems:  Per HPI  Objective:   Physical Exam: Gen:  NAD, pt ambulates with walker HEENT: Moist mucous membranes  CV: Regular rate and rhythm, no murmurs rubs or gallops PULM: Clear to auscultation bilaterally. No wheezes/rales/rhonchi EXT: No edema, no deformities. Mild healing skin excoriation on R lateral malleolus.  Neuro: Alert and oriented x3. No focalization Psych: mood and affect normal. Normal language and speech. No agitation.   Assessment & Plan:

## 2013-07-26 NOTE — Patient Instructions (Signed)
It has been a pleasure to see you today. You can discontinue taking Flexeryl and only use Ice-Hot topical for pain.  I will call you with the labs results if they come back abnormal otherwise you will receive a letter. Make your next appointment in 3 months or sooner if needed.

## 2013-07-27 DIAGNOSIS — F319 Bipolar disorder, unspecified: Secondary | ICD-10-CM | POA: Diagnosis not present

## 2013-08-10 ENCOUNTER — Ambulatory Visit (INDEPENDENT_AMBULATORY_CARE_PROVIDER_SITE_OTHER): Payer: Medicare Other | Admitting: Family Medicine

## 2013-08-10 ENCOUNTER — Encounter: Payer: Self-pay | Admitting: *Deleted

## 2013-08-10 ENCOUNTER — Encounter: Payer: Self-pay | Admitting: Family Medicine

## 2013-08-10 VITALS — BP 132/66 | HR 85 | Temp 98.6°F | Ht 65.0 in | Wt 139.0 lb

## 2013-08-10 DIAGNOSIS — A050 Foodborne staphylococcal intoxication: Secondary | ICD-10-CM

## 2013-08-10 MED ORDER — ONDANSETRON 4 MG PO TBDP: 4.0000 mg | ORAL_TABLET | Freq: Three times a day (TID) | ORAL | Status: DC | PRN

## 2013-08-10 NOTE — Progress Notes (Signed)
Family Medicine Office Visit Note   Subjective:   Patient ID: [name], female  DOB: 1941-02-27, 73 y.o.. MRN: [id number]   Pt that comes today for same-day appointment complaining of abdominal pain. She reports yesterday eating ham, potato salad and 30 minutes after her meal start having abdominal cramps. The cramps have improved today but she continues to have mild nausea without vomiting. Denies diarrhea, fever, general malaise, or other symptoms.   Review of Systems:  Per history of present illness  Objective:   Physical Exam: Gen:  NAD HEENT: Moist mucous membranes  CV: Regular rate and rhythm, no murmurs rubs or gallops PULM: Clear to auscultation bilaterally. No wheezes/rales/rhonchi ABD: Soft, non tender, non distended, normal bowel sounds EXT: No edema Neuro: Alert and oriented x3. No focalization  Assessment & Plan:

## 2013-08-10 NOTE — Assessment & Plan Note (Signed)
Clinical diagnosis. Mild symptoms, no vomiting, no acute abdomen Supportive treatment and discussed signs that should be watched. F/u as needed.

## 2013-08-10 NOTE — Progress Notes (Addendum)
Prior authorization received from [employer] for Ondansetron ODT 4 mg tab. PA was approved through [employer] ([place]). PA authorized through 05/12/2013 to 08/10/2014 and case number [id number].  Derl Barrow, RN

## 2013-08-10 NOTE — Patient Instructions (Signed)
It has been a pleasure to see you today. Please take the medications as prescribed. Zofran as needed for nausea. It is important that you eat and keep yourself hydrated.  Follow up if you develop, fever, nausea is not controlled on the medication, diarrhea with blood or mucus or any other concerning symptoms

## 2013-08-23 ENCOUNTER — Emergency Department (INDEPENDENT_AMBULATORY_CARE_PROVIDER_SITE_OTHER)
Admission: EM | Admit: 2013-08-23 | Discharge: 2013-08-23 | Disposition: A | Discharge status: Home / Self Care | Payer: Medicare Other | Source: Home / Self Care | Attending: Family Medicine | Admitting: Family Medicine

## 2013-08-23 ENCOUNTER — Encounter (HOSPITAL_COMMUNITY): Payer: Self-pay | Admitting: Emergency Medicine

## 2013-08-23 DIAGNOSIS — R079 Chest pain, unspecified: Secondary | ICD-10-CM

## 2013-08-23 DIAGNOSIS — R0789 Other chest pain: Secondary | ICD-10-CM

## 2013-08-23 NOTE — ED Notes (Signed)
Seen by dr Juventino Slovak prior to this nurse.  When this nurse attempted to talk to patient, patient reported she was leaving

## 2013-08-23 NOTE — ED Notes (Signed)
No truck available

## 2013-08-23 NOTE — ED Provider Notes (Signed)
CSN: [id number]     Arrival date & time 08/23/13  1901 History   First MD Initiated Contact with Patient 08/23/13 1939     No chief complaint on file.  (Consider location/radiation/quality/duration/timing/severity/associated sxs/prior Treatment) Patient is a 73 y.o. female presenting with chest pain. The history is provided by the patient.  Chest Pain Pain location:  L chest Pain quality: sharp   Pain radiates to:  L shoulder Pain radiates to the back: no   Pain severity:  Mild Onset quality:  Gradual Duration:  4 hours Progression:  Worsening Chronicity:  Recurrent Context comment:  Off and on since sat with constant sx since 4pm,  Relieved by:  None tried Associated symptoms: weakness   Associated symptoms: no abdominal pain, no back pain, no diaphoresis, no fever, no nausea, no palpitations, no shortness of breath and not vomiting     Past Medical History  Diagnosis Date  . Depression   . Gout   . Bipolar 1 disorder   . Schizoaffective disorder   . Chronic kidney disease   . Hypertension   . Seizure disorder   . Thrombocytopenia   . Anemia    Past Surgical History  Procedure Laterality Date  . Tubal ligation    . Appendectomy    . Cardiac catheterization  2005    Normal   Family History  Problem Relation Age of Onset  . Heart attack Mother   . Heart disease Mother   . Heart attack Father   . Heart disease Father   . Hypertension Brother    History  Substance Use Topics  . Smoking status: Never Smoker   . Smokeless tobacco: Never Used  . Alcohol Use: No   OB History   Grav Para Term Preterm Abortions TAB SAB Ect Mult Living                 Review of Systems  Constitutional: Negative for fever and diaphoresis.  Respiratory: Negative for chest tightness and shortness of breath.   Cardiovascular: Positive for chest pain. Negative for palpitations and leg swelling.  Gastrointestinal: Negative.  Negative for nausea, vomiting and abdominal pain.   Musculoskeletal: Negative for back pain.  Neurological: Positive for weakness.    Allergies  Ativan; Benztropine mesylate; Clonazepam; Vicodin; Lisinopril; and Sulfonamide derivatives  Home Medications   Prior to Admission medications   Medication Sig Start Date End Date Taking? Authorizing Provider  acetaminophen (TYLENOL) 325 MG tablet Take 650 mg by mouth every 6 (six) hours as needed for pain.    Historical Provider, MD  Calcium Citrate-Vitamin D (CALCIUM CITRATE + D3 PO) Take 1 tablet by mouth 2 (two) times daily.    Historical Provider, MD  diclofenac (VOLTAREN) 75 MG EC tablet Take 1 tablet (75 mg total) by mouth 2 (two) times daily. 07/07/13   Angelica Ran, MD  diltiazem (CARDIZEM) 60 MG tablet TAKE 1 TABLET BY MOUTH THREE TIMES DAILY 06/22/13   Sueanne Margarita, MD  divalproex (DEPAKOTE ER) 500 MG 24 hr tablet Take 1 tablet (500 mg total) by mouth daily. 01/18/13   Hilton Sinclair, MD  ferrous sulfate 325 (65 FE) MG tablet Take 325 mg by mouth 2 (two) times daily with a meal.    Historical Provider, MD  Menthol, Topical Analgesic, (ICY HOT) 7.5 % (ROLL) MISC Apply 1 each topically as needed (for pain).    Historical Provider, MD  Multiple Vitamins-Minerals (CENTRUM SILVER ADULT 50+ PO) Take 1 tablet by mouth daily.  Historical Provider, MD  ondansetron (ZOFRAN ODT) 4 MG disintegrating tablet Take 1 tablet (4 mg total) by mouth every 8 (eight) hours as needed for nausea or vomiting. 08/10/13   Dayarmys Piloto de Gwendalyn Ege, MD  polyethylene glycol powder (GLYCOLAX/MIRALAX) powder TAKE 17 GRAMS WITH LIQUID BY MOUTH EVERY DAY AS NEEDED 07/13/13   Dayarmys [place] de Gwendalyn Ege, MD  polyvinyl alcohol (LIQUIFILM TEARS) 1.4 % ophthalmic solution Place 1 drop into both eyes as needed. For dry eyes    Historical Provider, MD  risperiDONE (RISPERDAL) 0.5 MG tablet Take 0.5 mg by mouth 2 (two) times daily.    Historical Provider, MD   BP 190/77  Pulse 97  Temp(Src) 98.6 F (37 C) (Oral)   Resp 16  SpO2 97% Physical Exam  Nursing note and vitals reviewed. Constitutional: She is oriented to person, place, and time. She appears well-developed and well-nourished. She appears distressed.  Neck: Normal range of motion. Neck supple.  Cardiovascular: Normal rate, regular rhythm and normal heart sounds.   Pulmonary/Chest: Effort normal and breath sounds normal. She exhibits no tenderness.  Abdominal: Soft. Bowel sounds are normal. She exhibits no mass. There is no tenderness.  Musculoskeletal: She exhibits no edema.  Pulses 2+.  Lymphadenopathy:    She has no cervical adenopathy.  Neurological: She is alert and oriented to person, place, and time.  Skin: Skin is warm and dry.    ED Course  Procedures (including critical care time) Labs Review Labs Reviewed - No data to display  Imaging Review No results found.  ecg-wnl.rate 75. MDM  No diagnosis found. Sent for cardiac eval of chest pain , nl ecg.    Billy Fischer, MD 08/23/13 1950

## 2013-08-23 NOTE — ED Notes (Signed)
Went into patient room to discuss transfer and prepare patient for transfer.  Patient immediately said she had made a mistake and was leaving.  Notified dr Juventino Slovak, notified patient's brother sitting in the lobby.  Brothers name mr r tolton.  Mr tolton unable to change patient's mind, patient leaving.

## 2013-08-26 DIAGNOSIS — B351 Tinea unguium: Secondary | ICD-10-CM | POA: Diagnosis not present

## 2013-08-26 DIAGNOSIS — M79609 Pain in unspecified limb: Secondary | ICD-10-CM | POA: Diagnosis not present

## 2013-08-27 DIAGNOSIS — F319 Bipolar disorder, unspecified: Secondary | ICD-10-CM | POA: Diagnosis not present

## 2013-09-03 ENCOUNTER — Other Ambulatory Visit (INDEPENDENT_AMBULATORY_CARE_PROVIDER_SITE_OTHER): Payer: Medicare Other

## 2013-09-03 ENCOUNTER — Other Ambulatory Visit: Payer: Self-pay | Admitting: Family Medicine

## 2013-09-03 DIAGNOSIS — E785 Hyperlipidemia, unspecified: Secondary | ICD-10-CM

## 2013-09-03 DIAGNOSIS — R739 Hyperglycemia, unspecified: Secondary | ICD-10-CM

## 2013-09-03 DIAGNOSIS — R7309 Other abnormal glucose: Secondary | ICD-10-CM

## 2013-09-03 DIAGNOSIS — F319 Bipolar disorder, unspecified: Secondary | ICD-10-CM

## 2013-09-03 LAB — POCT GLYCOSYLATED HEMOGLOBIN (HGB A1C): HEMOGLOBIN A1C: 5.7

## 2013-09-03 NOTE — Progress Notes (Signed)
BMP,CBC ,FLP ,A1C AND VALOPORIC LEVEL DONE TODAY  [place]

## 2013-09-04 LAB — BASIC METABOLIC PANEL
BUN: 15 mg/dL (ref 6–23)
CHLORIDE: 106 meq/L (ref 96–112)
CO2: 27 meq/L (ref 19–32)
CREATININE: 0.92 mg/dL (ref 0.50–1.10)
Calcium: 10.3 mg/dL (ref 8.4–10.5)
GLUCOSE: 82 mg/dL (ref 70–99)
Potassium: 4.7 mEq/L (ref 3.5–5.3)
Sodium: 144 mEq/L (ref 135–145)

## 2013-09-04 LAB — LIPID PANEL
CHOLESTEROL: 227 mg/dL — AB (ref 0–200)
HDL: 70 mg/dL (ref >39)
LDL Cholesterol: 130 mg/dL — ABNORMAL HIGH (ref 0–99)
Total CHOL/HDL Ratio: 3.2 Ratio
Triglycerides: 136 mg/dL (ref <150)
VLDL: 27 mg/dL (ref 0–40)

## 2013-09-04 LAB — CBC
HEMATOCRIT: 38.4 % (ref 36.0–46.0)
HEMOGLOBIN: 12.6 g/dL (ref 12.0–15.0)
MCH: 26.5 pg (ref 26.0–34.0)
MCHC: 32.8 g/dL (ref 30.0–36.0)
MCV: 80.8 fL (ref 78.0–100.0)
Platelets: 193 10*3/uL (ref 150–400)
RBC: 4.75 MIL/uL (ref 3.87–5.11)
RDW: 19.3 % — AB (ref 11.5–15.5)
WBC: 4.5 10*3/uL (ref 4.0–10.5)

## 2013-09-04 LAB — VALPROIC ACID LEVEL: VALPROIC ACID LVL: 62.2 ug/mL (ref 50.0–100.0)

## 2013-09-14 ENCOUNTER — Other Ambulatory Visit: Payer: Self-pay | Admitting: Family Medicine

## 2013-10-04 ENCOUNTER — Encounter: Payer: Self-pay | Admitting: Family Medicine

## 2013-10-04 ENCOUNTER — Ambulatory Visit (INDEPENDENT_AMBULATORY_CARE_PROVIDER_SITE_OTHER): Payer: Medicare Other | Admitting: Family Medicine

## 2013-10-04 VITALS — BP 130/88 | HR 69 | Temp 98.7°F | Ht 65.0 in | Wt 143.0 lb

## 2013-10-04 DIAGNOSIS — R2240 Localized swelling, mass and lump, unspecified lower limb: Secondary | ICD-10-CM

## 2013-10-04 DIAGNOSIS — M7989 Other specified soft tissue disorders: Secondary | ICD-10-CM | POA: Diagnosis not present

## 2013-10-04 MED ORDER — DICLOFENAC SODIUM 1 % TD GEL: 2.0000 g | Freq: Four times a day (QID) | TRANSDERMAL | Status: DC

## 2013-10-04 NOTE — Progress Notes (Signed)
Family Medicine Office Visit Note   Subjective:   Patient ID: NAVINA WOHLERS, female  DOB: July 01, 1940, 73 y.o.. MRN: [id number]   Pt that comes today complaining of right foot swelling noticed 1 week ago. She reports injury of her forefoot in 2012 when she twisted it. After that she reports intermittent area swelling when she had her initial injury. Denies recent redness, bruising or pain. She is able to walk with an aid (walker) and denies any recent changes/needs for ambulation.     Review of Systems:  Per HPI  Objective:   Physical Exam: Gen:  NAD HEENT: Moist mucous membranes  CV: Regular rate and rhythm, no murmurs rubs or gallops PULM: Clear to auscultation bilaterally. No wheezes/rales/rhonchi ABD: Soft, non tender, non distended, normal bowel sounds EXT: No edema. R foot with mild localized effusion on lateral dorsal aspect ~2cm diameter. Non fluctuant and w/o erythema or ecchymosis. ROM of ankle is intact. Neurovascular also intact. No joint laxity. Neuro: Alert and oriented x3. No focalization Psych: normal mood flat affect. Normal speech. Normal though process. No hallucinations/ delusions. No agitation.  Assessment & Plan:

## 2013-10-04 NOTE — Patient Instructions (Signed)
Please elevate your lower extremity, place ice 3 times a day for 20 min and apply Voltaren gel as prescribed.

## 2013-10-07 DIAGNOSIS — R2240 Localized swelling, mass and lump, unspecified lower limb: Secondary | ICD-10-CM | POA: Insufficient documentation

## 2013-10-07 NOTE — Assessment & Plan Note (Addendum)
Localized ~2cm diameter area on lateral aspect of R foot. Normal ankle joint on exam. No history of recent trauma. No signsof localized infection.  Rest Elevation Ice  Voltaren topical F/u as needed Discussed imaging with pt which she refused.

## 2013-10-12 IMAGING — CR DG CHEST 2V
1 series · 1 of 1 positions shown · non-contrast
Comparison: 11/21/2012

CLINICAL DATA: Seizure.

EXAM:
CHEST  2 VIEW

[w chest lat]
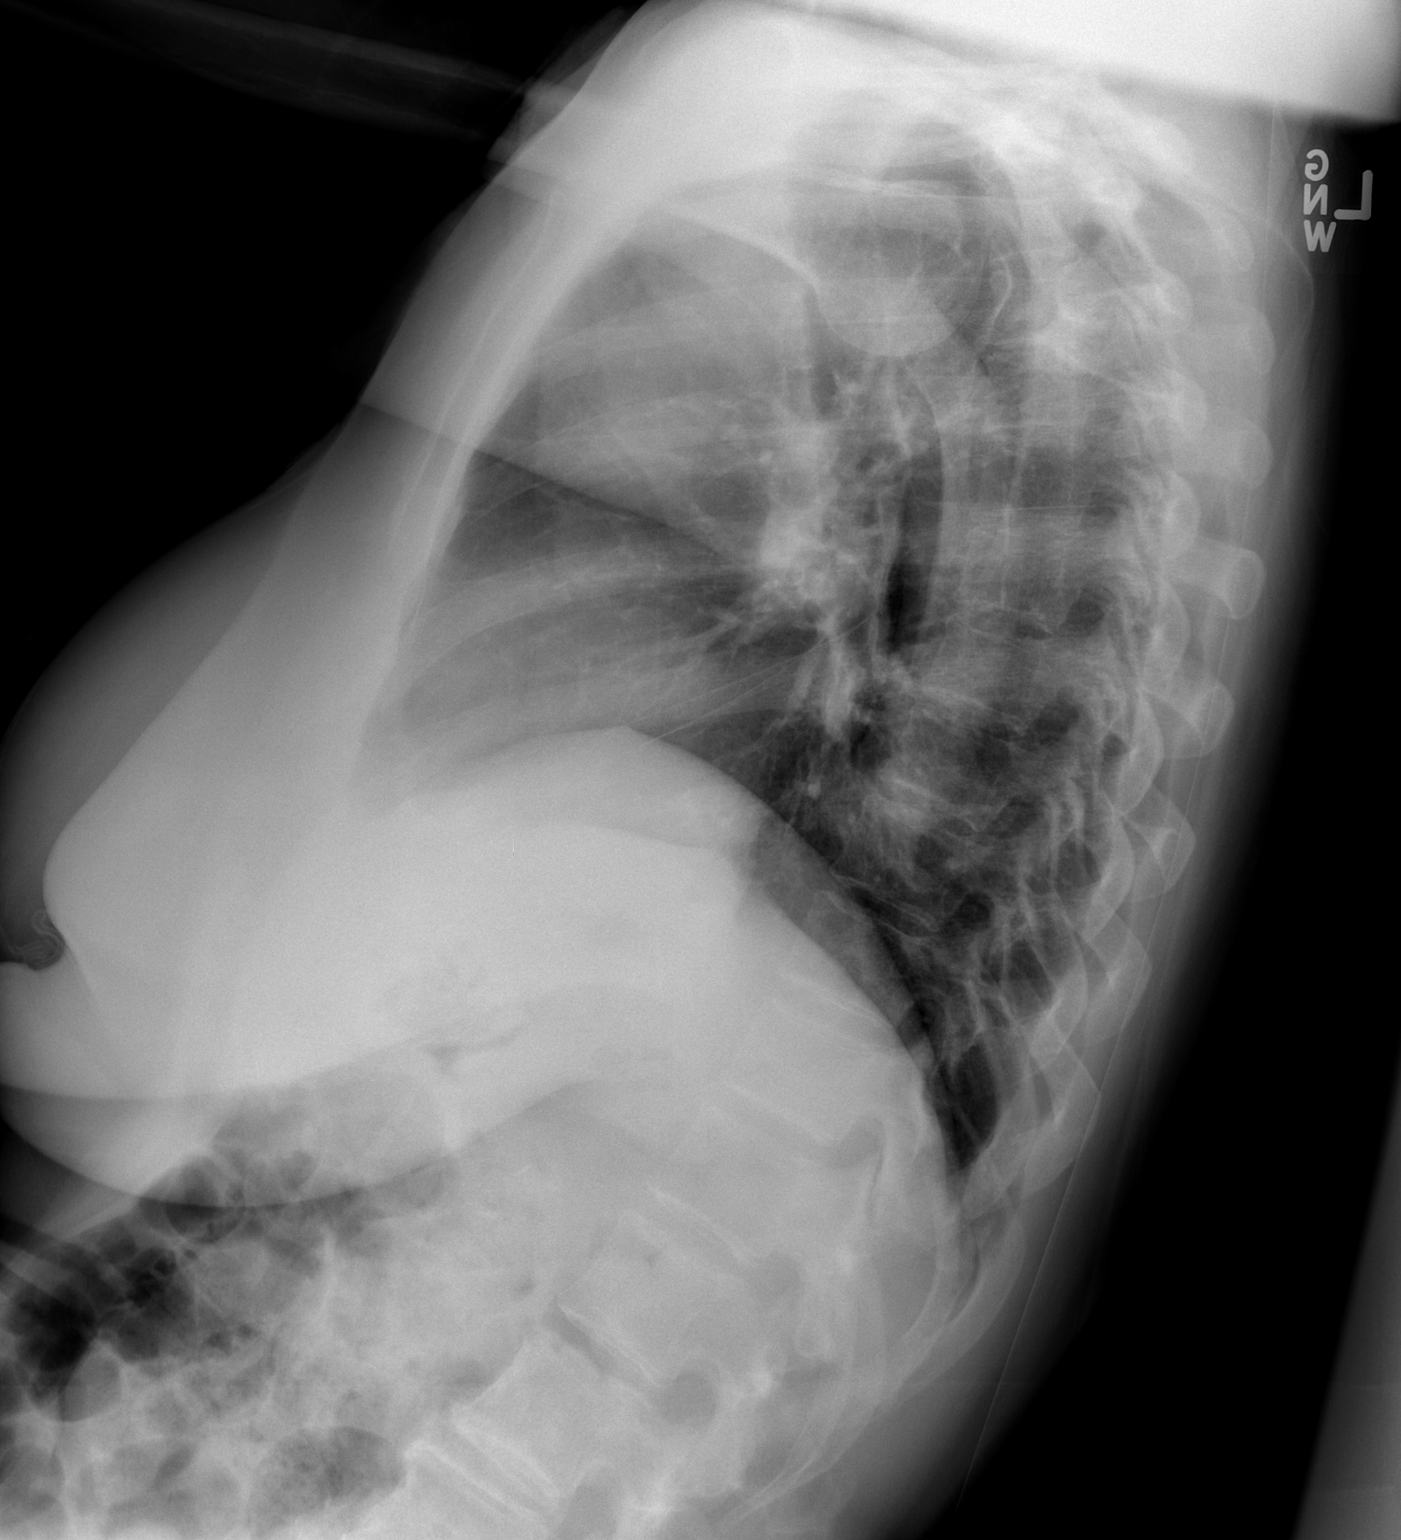

[1 of 1 positions shown; findings below may reference images not displayed]

FINDINGS: The heart size and mediastinal contours are within normal limits.
Both lungs are clear. The visualized skeletal structures are
unremarkable.
IMPRESSION: No active cardiopulmonary disease.

## 2013-11-07 ENCOUNTER — Emergency Department (HOSPITAL_COMMUNITY): Payer: Medicare Other

## 2013-11-07 ENCOUNTER — Encounter (HOSPITAL_COMMUNITY): Payer: Self-pay | Admitting: Emergency Medicine

## 2013-11-07 ENCOUNTER — Inpatient Hospital Stay (HOSPITAL_COMMUNITY)
Admission: EM | Admit: 2013-11-07 | Discharge: 2013-11-10 | DRG: 392 | Disposition: A | Discharge status: Home / Self Care | Payer: Medicare Other | Attending: Family Medicine | Admitting: Family Medicine

## 2013-11-07 DIAGNOSIS — N183 Chronic kidney disease, stage 3 unspecified: Secondary | ICD-10-CM | POA: Diagnosis not present

## 2013-11-07 DIAGNOSIS — IMO0001 Reserved for inherently not codable concepts without codable children: Secondary | ICD-10-CM

## 2013-11-07 DIAGNOSIS — I209 Angina pectoris, unspecified: Secondary | ICD-10-CM | POA: Diagnosis present

## 2013-11-07 DIAGNOSIS — M79609 Pain in unspecified limb: Secondary | ICD-10-CM | POA: Diagnosis not present

## 2013-11-07 DIAGNOSIS — M7989 Other specified soft tissue disorders: Secondary | ICD-10-CM | POA: Diagnosis not present

## 2013-11-07 DIAGNOSIS — R0789 Other chest pain: Secondary | ICD-10-CM

## 2013-11-07 DIAGNOSIS — R079 Chest pain, unspecified: Secondary | ICD-10-CM

## 2013-11-07 DIAGNOSIS — Z0389 Encounter for observation for other suspected diseases and conditions ruled out: Secondary | ICD-10-CM

## 2013-11-07 DIAGNOSIS — E785 Hyperlipidemia, unspecified: Secondary | ICD-10-CM | POA: Diagnosis present

## 2013-11-07 DIAGNOSIS — Z888 Allergy status to other drugs, medicaments and biological substances status: Secondary | ICD-10-CM

## 2013-11-07 DIAGNOSIS — F319 Bipolar disorder, unspecified: Secondary | ICD-10-CM | POA: Diagnosis present

## 2013-11-07 DIAGNOSIS — M25569 Pain in unspecified knee: Secondary | ICD-10-CM

## 2013-11-07 DIAGNOSIS — R52 Pain, unspecified: Secondary | ICD-10-CM | POA: Diagnosis not present

## 2013-11-07 DIAGNOSIS — I10 Essential (primary) hypertension: Secondary | ICD-10-CM

## 2013-11-07 DIAGNOSIS — F259 Schizoaffective disorder, unspecified: Secondary | ICD-10-CM | POA: Diagnosis present

## 2013-11-07 DIAGNOSIS — E7800 Pure hypercholesterolemia, unspecified: Secondary | ICD-10-CM | POA: Diagnosis present

## 2013-11-07 DIAGNOSIS — R9439 Abnormal result of other cardiovascular function study: Secondary | ICD-10-CM

## 2013-11-07 DIAGNOSIS — I2510 Atherosclerotic heart disease of native coronary artery without angina pectoris: Secondary | ICD-10-CM | POA: Diagnosis present

## 2013-11-07 DIAGNOSIS — Z8249 Family history of ischemic heart disease and other diseases of the circulatory system: Secondary | ICD-10-CM

## 2013-11-07 DIAGNOSIS — Z9861 Coronary angioplasty status: Secondary | ICD-10-CM | POA: Diagnosis not present

## 2013-11-07 DIAGNOSIS — K219 Gastro-esophageal reflux disease without esophagitis: Secondary | ICD-10-CM | POA: Diagnosis not present

## 2013-11-07 DIAGNOSIS — G40909 Epilepsy, unspecified, not intractable, without status epilepticus: Secondary | ICD-10-CM | POA: Diagnosis present

## 2013-11-07 DIAGNOSIS — I129 Hypertensive chronic kidney disease with stage 1 through stage 4 chronic kidney disease, or unspecified chronic kidney disease: Secondary | ICD-10-CM | POA: Diagnosis not present

## 2013-11-07 DIAGNOSIS — Z882 Allergy status to sulfonamides status: Secondary | ICD-10-CM | POA: Diagnosis not present

## 2013-11-07 DIAGNOSIS — Z885 Allergy status to narcotic agent status: Secondary | ICD-10-CM

## 2013-11-07 HISTORY — DX: Encounter for observation for other suspected diseases and conditions ruled out: Z03.89

## 2013-11-07 LAB — TROPONIN I: Troponin I: <0.3 ng/mL (ref <0.30)

## 2013-11-07 LAB — CBC
HCT: 36.4 % (ref 36.0–46.0)
Hemoglobin: 11.6 g/dL — ABNORMAL LOW (ref 12.0–15.0)
MCH: 26.7 pg (ref 26.0–34.0)
MCHC: 31.9 g/dL (ref 30.0–36.0)
MCV: 83.9 fL (ref 78.0–100.0)
PLATELETS: 215 10*3/uL (ref 150–400)
RBC: 4.34 MIL/uL (ref 3.87–5.11)
RDW: 15.9 % — ABNORMAL HIGH (ref 11.5–15.5)
WBC: 5.2 10*3/uL (ref 4.0–10.5)

## 2013-11-07 LAB — BASIC METABOLIC PANEL
Anion gap: 13 (ref 5–15)
BUN: 21 mg/dL (ref 6–23)
CALCIUM: 10.3 mg/dL (ref 8.4–10.5)
CHLORIDE: 100 meq/L (ref 96–112)
CO2: 27 meq/L (ref 19–32)
CREATININE: 0.93 mg/dL (ref 0.50–1.10)
GFR calc Af Amer: 69 mL/min — ABNORMAL LOW (ref >90)
GFR, EST NON AFRICAN AMERICAN: 60 mL/min — AB (ref >90)
Glucose, Bld: 171 mg/dL — ABNORMAL HIGH (ref 70–99)
POTASSIUM: 4 meq/L (ref 3.7–5.3)
Sodium: 140 mEq/L (ref 137–147)

## 2013-11-07 LAB — LIPID PANEL
CHOL/HDL RATIO: 2.5 ratio
Cholesterol: 188 mg/dL (ref 0–200)
HDL: 75 mg/dL (ref >39)
LDL Cholesterol: 89 mg/dL (ref 0–99)
Triglycerides: 118 mg/dL (ref <150)
VLDL: 24 mg/dL (ref 0–40)

## 2013-11-07 LAB — I-STAT TROPONIN, ED: Troponin i, poc: 0 ng/mL (ref 0.00–0.08)

## 2013-11-07 MED ORDER — VITAMIN C 500 MG PO TABS
500.0000 mg | ORAL_TABLET | Freq: Every day | ORAL | Status: DC
  Administered 2013-11-07 – 2013-11-10 (×4): 500 mg via ORAL
  Filled 2013-11-07 (×4): qty 1

## 2013-11-07 MED ORDER — DIVALPROEX SODIUM ER 500 MG PO TB24
500.0000 mg | ORAL_TABLET | Freq: Every day | ORAL | Status: DC
  Administered 2013-11-07 – 2013-11-10 (×4): 500 mg via ORAL
  Filled 2013-11-07 (×4): qty 1

## 2013-11-07 MED ORDER — MORPHINE SULFATE 2 MG/ML IJ SOLN
2.0000 mg | INTRAMUSCULAR | Status: DC | PRN
  Administered 2013-11-07: 2 mg via INTRAVENOUS
  Filled 2013-11-07 (×3): qty 1

## 2013-11-07 MED ORDER — ACETAMINOPHEN 325 MG PO TABS
650.0000 mg | ORAL_TABLET | Freq: Four times a day (QID) | ORAL | Status: DC | PRN
  Administered 2013-11-10: 650 mg via ORAL
  Filled 2013-11-07: qty 2

## 2013-11-07 MED ORDER — NITROGLYCERIN 0.4 MG SL SUBL
0.4000 mg | SUBLINGUAL_TABLET | SUBLINGUAL | Status: DC | PRN
  Administered 2013-11-07 – 2013-11-08 (×2): 0.4 mg via SUBLINGUAL

## 2013-11-07 MED ORDER — ACETAMINOPHEN 650 MG RE SUPP: 650.0000 mg | Freq: Four times a day (QID) | RECTAL | Status: DC | PRN

## 2013-11-07 MED ORDER — ONDANSETRON HCL 4 MG/2ML IJ SOLN: 4.0000 mg | Freq: Four times a day (QID) | INTRAMUSCULAR | Status: DC | PRN

## 2013-11-07 MED ORDER — POLYETHYLENE GLYCOL 3350 17 G PO PACK
17.0000 g | PACK | Freq: Every day | ORAL | Status: DC | PRN
  Administered 2013-11-08: 17 g via ORAL
  Filled 2013-11-07: qty 1

## 2013-11-07 MED ORDER — POLYVINYL ALCOHOL 1.4 % OP SOLN
1.0000 [drp] | Freq: Every day | OPHTHALMIC | Status: DC | PRN
  Filled 2013-11-07: qty 15

## 2013-11-07 MED ORDER — ENOXAPARIN SODIUM 40 MG/0.4ML ~~LOC~~ SOLN
40.0000 mg | SUBCUTANEOUS | Status: DC
Start: 2013-11-07 — End: 2013-11-09
  Administered 2013-11-07 – 2013-11-08 (×2): 40 mg via SUBCUTANEOUS
  Filled 2013-11-07 (×3): qty 0.4

## 2013-11-07 MED ORDER — DILTIAZEM HCL 60 MG PO TABS
60.0000 mg | ORAL_TABLET | Freq: Three times a day (TID) | ORAL | Status: DC
  Administered 2013-11-07 – 2013-11-10 (×9): 60 mg via ORAL
  Filled 2013-11-07 (×12): qty 1

## 2013-11-07 MED ORDER — ADULT MULTIVITAMIN W/MINERALS CH
1.0000 | ORAL_TABLET | Freq: Every day | ORAL | Status: DC
  Administered 2013-11-07 – 2013-11-10 (×4): 1 via ORAL
  Filled 2013-11-07 (×4): qty 1

## 2013-11-07 MED ORDER — ASPIRIN 81 MG PO CHEW
324.0000 mg | CHEWABLE_TABLET | Freq: Once | ORAL | Status: AC
  Administered 2013-11-07: 324 mg via ORAL
  Filled 2013-11-07: qty 4

## 2013-11-07 MED ORDER — RISPERIDONE 2 MG PO TABS
2.0000 mg | ORAL_TABLET | Freq: Every day | ORAL | Status: DC
Start: 2013-11-07 — End: 2013-11-10
  Administered 2013-11-07 – 2013-11-09 (×3): 2 mg via ORAL
  Filled 2013-11-07 (×4): qty 1

## 2013-11-07 MED ORDER — ONDANSETRON HCL 4 MG PO TABS: 4.0000 mg | ORAL_TABLET | Freq: Four times a day (QID) | ORAL | Status: DC | PRN

## 2013-11-07 MED ORDER — SODIUM CHLORIDE 0.9 % IJ SOLN
3.0000 mL | Freq: Two times a day (BID) | INTRAMUSCULAR | Status: DC
  Administered 2013-11-07 – 2013-11-10 (×6): 3 mL via INTRAVENOUS

## 2013-11-07 NOTE — ED Provider Notes (Signed)
CSN: [id number]     Arrival date & time 11/07/13  0305 History   First MD Initiated Contact with Patient 11/07/13 0541     Chief Complaint  Patient presents with  . Chest Pain     (Consider location/radiation/quality/duration/timing/severity/associated sxs/prior Treatment) HPI Comments: 25 old female with cholesterol, anemia, bipolar, high blood pressure, kidney disease presents with left-sided chest pain diaphoresis. Patient was awoken from sleep with diaphoresis and left-sided sharp chest pain. Patient has had mild chest pain with using her walker however unsure if musculoskeletal. She denies any heart attack history or blood clot history, patient denies recent surgery, leg swelling or leg pain. Patient's chest pain is very mild left-sided time mild radiation to left upper back and left shoulder.  Patient is a 73 y.o. female presenting with chest pain. The history is provided by the patient.  Chest Pain Associated symptoms: diaphoresis   Associated symptoms: no abdominal pain, no back pain, no fever, no headache, no shortness of breath and not vomiting     Past Medical History  Diagnosis Date  . Depression   . Gout   . Bipolar 1 disorder   . Schizoaffective disorder   . Chronic kidney disease   . Hypertension   . Seizure disorder   . Thrombocytopenia   . Anemia    Past Surgical History  Procedure Laterality Date  . Tubal ligation    . Appendectomy    . Cardiac catheterization  2005    Normal   Family History  Problem Relation Age of Onset  . Heart attack Mother   . Heart disease Mother   . Heart attack Father   . Heart disease Father   . Hypertension Brother    History  Substance Use Topics  . Smoking status: Never Smoker   . Smokeless tobacco: Never Used  . Alcohol Use: No   OB History   Grav Para Term Preterm Abortions TAB SAB Ect Mult Living                 Review of Systems  Constitutional: Positive for diaphoresis. Negative for fever and chills.  HENT:  Negative for congestion.   Eyes: Negative for visual disturbance.  Respiratory: Negative for shortness of breath.   Cardiovascular: Positive for chest pain. Negative for leg swelling.  Gastrointestinal: Negative for vomiting and abdominal pain.  Genitourinary: Negative for dysuria and flank pain.  Musculoskeletal: Negative for back pain, neck pain and neck stiffness.  Skin: Negative for rash.  Neurological: Negative for light-headedness and headaches.      Allergies  Ativan; Benztropine mesylate; Clonazepam; Vicodin; Lisinopril; and Sulfonamide derivatives  Home Medications   Prior to Admission medications   Medication Sig Start Date End Date Taking? Authorizing Provider  Calcium Citrate-Vitamin D (CALCIUM CITRATE + D3 PO) Take 1 tablet by mouth 2 (two) times daily.   Yes Historical Provider, MD  diltiazem (CARDIZEM) 60 MG tablet Take 60 mg by mouth 3 (three) times daily.   Yes Historical Provider, MD  divalproex (DEPAKOTE ER) 500 MG 24 hr tablet Take 1 tablet (500 mg total) by mouth daily. 01/18/13  Yes Hilton Sinclair, MD  Menthol, Topical Analgesic, (ICY HOT) 7.5 % (ROLL) MISC Apply 1 each topically as needed (for pain).   Yes Historical Provider, MD  Multiple Vitamins-Minerals (CENTRUM SILVER ADULT 50+ PO) Take 1 tablet by mouth daily.   Yes Historical Provider, MD  polyethylene glycol (MIRALAX / GLYCOLAX) packet Take 17 g by mouth daily as needed for  mild constipation.   Yes Historical Provider, MD  polyvinyl alcohol (LIQUIFILM TEARS) 1.4 % ophthalmic solution Place 1 drop into both eyes daily as needed for dry eyes. For dry eyes   Yes Historical Provider, MD  risperiDONE (RISPERDAL) 2 MG tablet Take 2 mg by mouth at bedtime.   Yes Historical Provider, MD  vitamin C (ASCORBIC ACID) 500 MG tablet Take 500 mg by mouth daily.   Yes Historical Provider, MD   BP 133/59  Pulse 63  Temp(Src) 98.3 F (36.8 C) (Oral)  Resp 14  Ht 5\' 3"  (1.6 m)  Wt 146 lb (66.225 kg)  BMI 25.87  kg/m2  SpO2 100% Physical Exam  Nursing note and vitals reviewed. Constitutional: She is oriented to person, place, and time. She appears well-developed and well-nourished.  HENT:  Head: Normocephalic and atraumatic.  Eyes: Conjunctivae are normal. Right eye exhibits no discharge. Left eye exhibits no discharge.  Neck: Normal range of motion. Neck supple. No tracheal deviation present.  Cardiovascular: Normal rate, regular rhythm and intact distal pulses.   Pulmonary/Chest: Effort normal and breath sounds normal.  Abdominal: Soft. She exhibits no distension. There is no tenderness. There is no guarding.  Musculoskeletal: She exhibits no edema and no tenderness.  Neurological: She is alert and oriented to person, place, and time.  Skin: Skin is warm. No rash noted.  Psychiatric: She has a normal mood and affect.    ED Course  Procedures (including critical care time) Labs Review Labs Reviewed  CBC - Abnormal; Notable for the following:    Hemoglobin 11.6 (*)    RDW 15.9 (*)    All other components within normal limits  BASIC METABOLIC PANEL - Abnormal; Notable for the following:    Glucose, Bld 171 (*)    GFR calc non Af Amer 60 (*)    GFR calc Af Amer 69 (*)    All other components within normal limits  Randolm Idol, ED    Imaging Review Dg Chest Port 1 View  11/07/2013   CLINICAL DATA:  Chest pain.  EXAM: PORTABLE CHEST - 1 VIEW  COMPARISON:  Chest radiograph performed 01/26/2013  FINDINGS: The lungs are well-aerated and clear. There is no evidence of focal opacification, pleural effusion or pneumothorax.  The cardiomediastinal silhouette is within normal limits. No acute osseous abnormalities are seen.  IMPRESSION: No acute cardiopulmonary process seen.   Electronically Signed   By: Garald Balding M.D.   On: 11/07/2013 04:36     EKG Interpretation None     EKG reviewed sinus rhythm, heart rate 76, normal QT, nonspecific T wave flattening and inversion lateral and  inferior similar previous, no acute ST elevation. MDM   Final diagnoses:  None    Patient with chest pain and diaphoresis and 3 cardiac risk factors. EKG nonspecific findings overall similar to previous reviewed. Patient's chest pain minimal in ER. Morphine ordered when necessary and plan for telemetry observation page [facility]. Chest x-ray reviewed no acute findings. Discussed case with [facility] who accepts for telemetry observation.  The patients results and plan were reviewed and discussed.   Any x-rays performed were personally reviewed by myself.   Differential diagnosis were considered with the presenting HPI.  Medications  aspirin chewable tablet 324 mg (not administered)      Filed Vitals:   11/07/13 0445 11/07/13 0500 11/07/13 0515 11/07/13 0530  BP: 136/60 143/63 143/62 133/59  Pulse: 72 71 71 63  Temp:      TempSrc:  Resp: 19 20 19 14   Height:      Weight:      SpO2: 100% 100% 100% 100%    Admission/ observation were discussed with the admitting physician, patient and/or family and they are comfortable with the plan.     Mariea Clonts, MD 11/07/13 [phone]

## 2013-11-07 NOTE — Progress Notes (Signed)
VASCULAR LAB PRELIMINARY  PRELIMINARY  PRELIMINARY  PRELIMINARY  Bilateral lower extremity venous Dopplers completed.    Preliminary report:  There is no DVT noted in the bilateral lower extremities.   Dracen Reigle, RVT 11/07/2013, 5:22 PM

## 2013-11-07 NOTE — H&P (Signed)
History and Physical  [name] YQI:[id number] DOB: 14-Feb-1941 DOA: 11/07/2013   PCP: Lorna Few, DO   Chief Complaint: Chest pain  HPI:  73 year old female with a history of hypertension, hyperlipidemia, CKD stage III, bipolar disorder presents with left-sided chest discomfort that began around midnight on 11/07/2013. Patient will go to sleep sweating, and while getting up to change her bedding, the patient experienced left-sided chest discomfort there was sharp in nature. The patient is unclear how long it lasted, but states that it is on an off since that period of time. There are no exacerbating or alleviating factors. The patient has some associated nausea but denied any shortness of breath. She stated that she felt dizzy. The patient has not had any chest discomfort recently, she states that there has not been any decrease in her exercise tolerance. In the emergency department, the patient was pain-free without any shortness of breath or dizziness. The patient had a LexiScan stress test on 12/08/2012 which was negative with EF of 83%. She denies any fevers, chills, vomiting, diarrhea, abdominal pain, dysuria, hematuria, hematochezia, melena, headache, visual disturbance.  In the emergency department, BMP and CBC were unremarkable. Her hemoglobin was at baseline. Point of care troponin was negative. The patient was hemodynamically stable. Chest x-ray was negative. EKG showed sinus rhythm with T-wave inversion in V4-V6 which was unchanged from previous EKGs Assessment/Plan:  Atypical chest pain -Somewhat reproducible on palpation -Very low suspicion of ACS -Cycle troponins -Place and observation on telemetry -12/08/2012 LexiScan negative, EF 83% -05/28/2011 LexiScan negative, EF 86% Hypertension -Continue diltiazem 60 mg 3 times a day Bipolar disorder -Continue Risperdal and depakote CKD stage III -Baseline creatinine 0.9-1.1      Past Medical History  Diagnosis  Date  . Depression   . Gout   . Bipolar 1 disorder   . Schizoaffective disorder   . Chronic kidney disease   . Hypertension   . Seizure disorder   . Thrombocytopenia   . Anemia    Past Surgical History  Procedure Laterality Date  . Tubal ligation    . Appendectomy    . Cardiac catheterization  2005    Normal   Social History:  reports that she has never smoked. She has never used smokeless tobacco. She reports that she does not drink alcohol or use illicit drugs.   Family History  Problem Relation Age of Onset  . Heart attack Mother   . Heart disease Mother   . Heart attack Father   . Heart disease Father   . Hypertension Brother      Allergies  Allergen Reactions  . Ativan [Lorazepam] Other (See Comments)    Increases fall risk and was told not to take it.  . Benztropine Mesylate Other (See Comments)    Restless legs  . Clonazepam Other (See Comments)    Increases fall risk and was told not to take it.  . Vicodin [Hydrocodone-Acetaminophen] Other (See Comments)    Increases fall risk and was told not to take it.  . Lisinopril Other (See Comments)    cough  . Sulfonamide Derivatives Other (See Comments)    Pt does not remember an allergic reaction to sulfa drugs      Prior to Admission medications   Medication Sig Start Date End Date Taking? Authorizing Provider  Calcium Citrate-Vitamin D (CALCIUM CITRATE + D3 PO) Take 1 tablet by mouth 2 (two) times daily.   Yes Historical Provider, MD  diltiazem (  CARDIZEM) 60 MG tablet Take 60 mg by mouth 3 (three) times daily.   Yes Historical Provider, MD  divalproex (DEPAKOTE ER) 500 MG 24 hr tablet Take 1 tablet (500 mg total) by mouth daily. 01/18/13  Yes Hilton Sinclair, MD  Menthol, Topical Analgesic, (ICY HOT) 7.5 % (ROLL) MISC Apply 1 each topically as needed (for pain).   Yes Historical Provider, MD  Multiple Vitamins-Minerals (CENTRUM SILVER ADULT 50+ PO) Take 1 tablet by mouth daily.   Yes Historical Provider,  MD  polyethylene glycol (MIRALAX / GLYCOLAX) packet Take 17 g by mouth daily as needed for mild constipation.   Yes Historical Provider, MD  polyvinyl alcohol (LIQUIFILM TEARS) 1.4 % ophthalmic solution Place 1 drop into both eyes daily as needed for dry eyes. For dry eyes   Yes Historical Provider, MD  risperiDONE (RISPERDAL) 2 MG tablet Take 2 mg by mouth at bedtime.   Yes Historical Provider, MD  vitamin C (ASCORBIC ACID) 500 MG tablet Take 500 mg by mouth daily.   Yes Historical Provider, MD    Review of Systems:  Constitutional:  No weight loss, night sweats, Fevers, chills, fatigue.  Head&Eyes: No headache.  No vision loss.  No eye pain or scotoma ENT:  No Difficulty swallowing,Tooth/dental problems,Sore throat,    Cardio-vascular:  No chest pain, Orthopnea, PND, swelling in lower extremities,   palpitations  GI:  No  abdominal pain,vomiting, diarrhea, loss of appetite, hematochezia, melena, heartburn, indigestion, Resp:  No shortness of breath with exertion or at rest. No cough. No coughing up of blood .No wheezing.No chest wall deformity  Skin:  no rash or lesions.  GU:  no dysuria, change in color of urine, no urgency or frequency. No flank pain.  Musculoskeletal:  No joint pain or swelling. No decreased range of motion. No back pain.  Psych:  No change in mood or affect.  Neurologic: No headache, no dysesthesia, no focal weakness, no vision loss. No syncope  Physical Exam: Filed Vitals:   11/07/13 0545 11/07/13 0600 11/07/13 0615 11/07/13 0730  BP: 151/68 168/70 161/67 157/61  Pulse: 74 63 69 70  Temp:      TempSrc:      Resp: 19 17 22 21   Height:      Weight:      SpO2: 100% 100% 100% 100%   General:  A&O x 3, NAD, nontoxic, pleasant/cooperative Head/Eye: No conjunctival hemorrhage, no icterus, [place]/AT, No nystagmus ENT:  No icterus,  No thrush, good dentition, no pharyngeal exudate Neck:  No masses, no lymphadenpathy, no bruits CV:  RRR, no rub, no gallop, no  S3 Lung:  CTAB, good air movement, no wheeze, no rhonchi Abdomen: soft/NT, +BS, nondistended, no peritoneal signs Ext: No cyanosis, No rashes, No petechiae, No lymphangitis, 1+LE edema  Labs on Admission:  Basic Metabolic Panel:  Recent Labs Lab 11/07/13 0338  NA 140  K 4.0  CL 100  CO2 27  GLUCOSE 171*  BUN 21  CREATININE 0.93  CALCIUM 10.3   Liver Function Tests: No results found for this basename: AST, ALT, ALKPHOS, BILITOT, PROT, ALBUMIN,  in the last 168 hours No results found for this basename: LIPASE, AMYLASE,  in the last 168 hours No results found for this basename: AMMONIA,  in the last 168 hours CBC:  Recent Labs Lab 11/07/13 0338  WBC 5.2  HGB 11.6*  HCT 36.4  MCV 83.9  PLT 215   Cardiac Enzymes: No results found for this basename: CKTOTAL, CKMB,  CKMBINDEX, TROPONINI,  in the last 168 hours BNP: No components found with this basename: POCBNP,  CBG: No results found for this basename: GLUCAP,  in the last 168 hours  Radiological Exams on Admission: Dg Chest Port 1 View  11/07/2013   CLINICAL DATA:  Chest pain.  EXAM: PORTABLE CHEST - 1 VIEW  COMPARISON:  Chest radiograph performed 01/26/2013  FINDINGS: The lungs are well-aerated and clear. There is no evidence of focal opacification, pleural effusion or pneumothorax.  The cardiomediastinal silhouette is within normal limits. No acute osseous abnormalities are seen.  IMPRESSION: No acute cardiopulmonary process seen.   Electronically Signed   By: Garald Balding M.D.   On: 11/07/2013 04:36    EKG: Independently reviewed. Sinus, T-wave inversion V4-6    Time spent:45 minutes Code Status:   FULL Family Communication:  No Family at bedside   Roshawnda Pecora, DO  [facility] Pager [phone]  If 7PM-7AM, please contact night-coverage [email or web address] Password [facility] 11/07/2013, 7:48 AM

## 2013-11-07 NOTE — ED Notes (Signed)
Patient was sleeping and was awakened by CP,  States the pain went through to her back.  Moving increased the pain.  Ate something and took an antacid and went back to bed.  Upon arrival to the ED patient was pain free.  States she hurts when you touch her left chest.

## 2013-11-07 NOTE — ED Notes (Signed)
Admitting MD at bedside.

## 2013-11-07 NOTE — ED Notes (Signed)
Patient has been up to the [facility] 4 times since arriving in the ED.

## 2013-11-07 NOTE — ED Notes (Signed)
Patient stated that she did not need anything for pain.

## 2013-11-08 ENCOUNTER — Observation Stay (HOSPITAL_COMMUNITY): Payer: Medicare Other

## 2013-11-08 DIAGNOSIS — E7800 Pure hypercholesterolemia, unspecified: Secondary | ICD-10-CM | POA: Diagnosis not present

## 2013-11-08 DIAGNOSIS — I10 Essential (primary) hypertension: Secondary | ICD-10-CM

## 2013-11-08 DIAGNOSIS — R079 Chest pain, unspecified: Secondary | ICD-10-CM

## 2013-11-08 LAB — TROPONIN I: Troponin I: <0.3 ng/mL (ref <0.30)

## 2013-11-08 MED ORDER — TECHNETIUM TC 99M SESTAMIBI GENERIC - CARDIOLITE
30.0000 | Freq: Once | INTRAVENOUS | Status: AC | PRN
  Administered 2013-11-08: 30 via INTRAVENOUS

## 2013-11-08 MED ORDER — REGADENOSON 0.4 MG/5ML IV SOLN
INTRAVENOUS | Status: AC
  Administered 2013-11-08: 0.4 mg via INTRAVENOUS
  Filled 2013-11-08: qty 5

## 2013-11-08 MED ORDER — NITROGLYCERIN 0.4 MG SL SUBL
SUBLINGUAL_TABLET | SUBLINGUAL | Status: AC
  Filled 2013-11-08: qty 1

## 2013-11-08 MED ORDER — SODIUM CHLORIDE 0.9 % IV SOLN: 250.0000 mL | INTRAVENOUS | Status: DC | PRN

## 2013-11-08 MED ORDER — TECHNETIUM TC 99M SESTAMIBI GENERIC - CARDIOLITE
10.0000 | Freq: Once | INTRAVENOUS | Status: AC | PRN
  Administered 2013-11-08: 10 via INTRAVENOUS

## 2013-11-08 MED ORDER — SODIUM CHLORIDE 0.9 % IV SOLN
1.0000 mL/kg/h | INTRAVENOUS | Status: DC
  Administered 2013-11-09: 1 mL/kg/h via INTRAVENOUS

## 2013-11-08 MED ORDER — SODIUM CHLORIDE 0.9 % IJ SOLN: 3.0000 mL | Freq: Two times a day (BID) | INTRAMUSCULAR | Status: DC

## 2013-11-08 MED ORDER — SODIUM CHLORIDE 0.9 % IJ SOLN
3.0000 mL | INTRAMUSCULAR | Status: DC | PRN
Start: 2013-11-08 — End: 2013-11-09

## 2013-11-08 MED ORDER — ASPIRIN 81 MG PO CHEW
81.0000 mg | CHEWABLE_TABLET | ORAL | Status: AC
  Administered 2013-11-09: 81 mg via ORAL
  Filled 2013-11-08: qty 1

## 2013-11-08 MED ORDER — NITROGLYCERIN 0.4 MG SL SUBL: 0.4000 mg | SUBLINGUAL_TABLET | SUBLINGUAL | Status: DC | PRN

## 2013-11-08 MED ORDER — SODIUM CHLORIDE 0.9 % IJ SOLN
80.0000 mg | INTRAVENOUS | Status: AC
  Administered 2013-11-08: 80 mg via INTRAVENOUS

## 2013-11-08 MED ORDER — REGADENOSON 0.4 MG/5ML IV SOLN
0.4000 mg | Freq: Once | INTRAVENOUS | Status: AC
  Administered 2013-11-08: 0.4 mg via INTRAVENOUS
  Filled 2013-11-08: qty 5

## 2013-11-08 NOTE — Discharge Summary (Signed)
Physician Discharge Summary  Patient ID: [name] MRN: [id number] DOB: [birth year] Age: 73 y.o.  Admit date: 11/07/2013 Discharge date: 11/10/2013 Admitting Physician: Etta Quill, DO  PCP: Lorna Few, DO  Consultants:Cardiology (Dr. Fransico Him)      Discharge Diagnosis: Principal Problem:   Atypical chest pain Active Problems:   HYPERCHOLESTEROLEMIA   BIPOLAR DISORDER   HYPERTENSION, BENIGN SYSTEMIC   CHRONIC KIDNEY DISEASE STAGE III (MODERATE)   Chest pain with moderate risk for cardiac etiology    Hospital Course 73 year old female with a history of hypertension, hyperlipidemia, CKD stage III, bipolar disorder presented with left-sided chest discomfort.  # Atypical Angina - Pt had L sided CP/substernal CP that was radiating down her L arm that was not exacerbated by activity or relieved by rest/nitro.  She had an EKG which showed V4-V6 TWI, which was unchanged from previous EKG as well as troponin negative x 3.  Cardiology was consulted and recommended a Lexiscan Myoview which did not show active areas of ischemia.  She had previous PCI in 2005 not showing significant CAD and had Lexiscan x 2 in 2013 and 2014, both of which were normal.  ASCVD calculated from lipid panel this admission, which came to 4.3% 10 yr risk. Cardiac catheterization shows normal coronary arteries with no culprit lesion to explain either the patient's chest discomfort symptoms or the abnormal Myoview. This yields an impression of a false positive Myoview. Some mild to moderately elevated LVEDP with normal EF by Echocardiogram noted.   # HTN - Stable, continued home meds  # Bipolar Disorder - Stable, continued home meds  # HTN - Stable, continued home meds  # CKD Stage III - Stable creatinine during hospital stay          Discharge PE   Filed Vitals:   11/10/13 1050  BP: 139/62  Pulse:   Temp:   Resp:    General: WN/WD, NAD Head: Eyes PERRLA, No xanthomas. Normal cephalic and  atramatic  Lungs: CTAB Heart: RRR S1 S2   No carotid bruit. No JVD.  Abdomen: Bowel sounds are positive, abdomen soft and non-tender without masses  Extremities: No clubbing, cyanosis or edema. DP +2; RUE bandaged adequately, pulse good, cap refill <3sec, sensation intact. Neuro: Alert and oriented X 3.  Psych: Good affect, responds appropriately      Procedures/Imaging:  Dg Chest [facility] 11/07/2013  .  IMPRESSION: No acute cardiopulmonary process seen.   Electronically Signed   By: Garald Balding M.D.   On: 11/07/2013 04:36   Lexiscan Myoview 11/08/13 - IMPRESSION: Intermediate risk nuclear study with chest pain and significant ST depression with lexiscan infusion. The scintigraphic results showed inferior lateral thinning but no ischemia. The gated ejection fraction was 83%; normal wall motion. Given ST changes with infusion further ischemia evaluation with cardiac catheterization may be indicated.  Cardiac Cath 11/09/13 - POST-OPERATIVE DIAGNOSIS:  Normal coronary arteries with no culprit lesion to explain either the patient's chest discomfort symptoms or the abnormal Myoview. False Positive Myoview  Mild to moderately elevated LVEDP with normal EF by Echocardiogram    Labs  CBC  Recent Labs Lab 11/07/13 0338  WBC 5.2  HGB 11.6*  HCT 36.4  PLT 215   BMET  Recent Labs Lab 11/07/13 0338  NA 140  K 4.0  CL 100  CO2 27  BUN 21  CREATININE 0.93  CALCIUM 10.3  GLUCOSE 171*   Lipid Panel     Component Value Date/Time  CHOL 188 11/07/2013 1219   TRIG 118 11/07/2013 1219   HDL 75 11/07/2013 1219   CHOLHDL 2.5 11/07/2013 1219   VLDL 24 11/07/2013 1219   LDLCALC 89 11/07/2013 1219    Lab Results  Component Value Date   HGBA1C 5.7 09/03/2013      Patient condition at time of discharge/disposition: stable  Disposition-home   Follow up issues: 1. Calculated ASCVD 4.9 %, does not need need cholesterol tx at this time 2. Would consider ASA 81 mg for primary  prevention of CVD(stroke) in female post-menopausal   Discharge follow up:  Follow-up Information   Follow up with Lorna Few, DO. (Thursday August 13th @ 1:30 PM )    Specialty:  Family Medicine   Contact information:   [address] [place] [address] [phone]           Follow-up Information   Follow up with Lorna Few, DO. (Thursday August 13th @ 1:30 PM )    Specialty:  Family Medicine   Contact information:   [address] [place] [address] [phone]      Discharge Instructions: Please refer to Patient Instructions section of EMR for full details.  Patient was counseled important signs and symptoms that should prompt return to medical care, changes in medications, dietary instructions, activity restrictions, and follow up appointments.  Significant instructions noted below:    Discharge Medications   Medication List         CALCIUM CITRATE + D3 PO  Take 1 tablet by mouth 2 (two) times daily.     CENTRUM SILVER ADULT 50+ PO  Take 1 tablet by mouth daily.     diltiazem 60 MG tablet  Commonly known as:  CARDIZEM  Take 60 mg by mouth 3 (three) times daily.     divalproex 500 MG 24 hr tablet  Commonly known as:  DEPAKOTE ER  Take 1 tablet (500 mg total) by mouth daily.     ICY HOT 7.5 % (ROLL) Misc  Generic drug:  Menthol (Topical Analgesic)  Apply 1 each topically as needed (for pain).     nitroGLYCERIN 0.4 MG SL tablet  Commonly known as:  NITROSTAT  Place 1 tablet (0.4 mg total) under the tongue every 5 (five) minutes as needed for chest pain.     polyethylene glycol packet  Commonly known as:  MIRALAX / GLYCOLAX  Take 17 g by mouth daily as needed for mild constipation.     polyvinyl alcohol 1.4 % ophthalmic solution  Commonly known as:  LIQUIFILM TEARS  Place 1 drop into both eyes daily as needed for dry eyes. For dry eyes     risperiDONE 2 MG tablet  Commonly known as:  RISPERDAL  Take 2 mg by mouth at  bedtime.     vitamin C 500 MG tablet  Commonly known as:  ASCORBIC ACID  Take 500 mg by mouth daily.        Jameek Bruntz, Marylynn Pearson, DO of [facility] 11/10/2013 11:02 AM

## 2013-11-08 NOTE — Progress Notes (Signed)
Patient was admitted by the [facility] service on 11/07/13 for complaints of atypical chest pain. Discussed with patient and briefly reviewed patient's chart and gathered that patient's PCP is with the [facility] Family medicine teaching service. Discussed with Dr. Awanda Mink from the teaching service and transferred care over to their service for appropriate management. [facility] will sign off at this time. Please call us back for any further assistance.  Vernell Leep, MD, FACP, FHM. [facility] Pager [phone]  If 7PM-7AM, please contact night-coverage [email or web address] Password [email or web address] 11/08/2013, 10:44 AM

## 2013-11-08 NOTE — Progress Notes (Signed)
Family Medicine Teaching Service Daily Progress Note Intern Pager: [phone]  Patient name: [name] Medical record number: [id number] Date of birth: 09-Mar-1941 Age: 73 y.o. Gender: female  Primary Care Provider: Lorna Few, DO Consultants: Cardiology  Code Status: Full   Pt Overview and Major Events to Date:  7/27 - Lexiscan Myoview completed   Assessment and Plan: 73 year old female with a history of hypertension, hyperlipidemia, CKD stage III, bipolar disorder presented with left-sided chest discomfort.  #Atypical Angina - Pt r/o for acute coronary syndrome w/o changes that are acute on her EKG -Continue with telemetry - Consult to cards, appreciate recs - F/U Lexiscan - Consider Echocardiogram in outpt setting with cardiology f/u - Consider starting ASA for primary CVD PPx - Lipid Panel completed, ASCVD at 4.9% 10 yr risk  #HTN -  -Stable continue home meds  #Bipolar -Stable continue home meds  #CKD Stage III - Creatinine stable  FEN/GI: Heart Healthy Diet PPx: Heparin SQ  Disposition: Telemetry pending Myoview interpretation   Subjective:  Pt with CP during her test, otherwise no complaints  Objective: Temp:  [97.7 F (36.5 C)-98.1 F (36.7 C)] 98.1 F (36.7 C) (07/27 0549) Pulse Rate:  [76-100] 76 (07/27 0549) Resp:  [16-19] 16 (07/27 0549) BP: (123-179)/(58-81) 141/71 mmHg (07/27 1247) SpO2:  [99 %-100 %] 100 % (07/27 1200) Physical Exam: General: WN/WD, NAD  Head: Eyes PERRLA, No xanthomas. Normal cephalic and atramatic  Lungs: CTAB  Heart: RRR S1 S2  No carotid bruit. No JVD.  Abdomen: Bowel sounds are positive, abdomen soft and non-tender without masses  Extremities: No clubbing, cyanosis or edema. DP +2  Neuro: Alert and oriented X 3.  Psych: Good affect, responds appropriately  Laboratory:  Recent Labs Lab 11/07/13 0338  WBC 5.2  HGB 11.6*  HCT 36.4  PLT 215    Recent Labs Lab 11/07/13 0338  NA 140  K 4.0  CL 100  CO2  27  BUN 21  CREATININE 0.93  CALCIUM 10.3  GLUCOSE 171*    Imaging/Diagnostic Tests: Cardiac Lexiscan Pending   Nolon Rod, DO 11/08/2013, 1:43 PM PGY-3, [place] Intern pager: [phone], text pages welcome

## 2013-11-08 NOTE — Progress Notes (Signed)
The patient did not tolerate the lexiscan myovue very well.  Severe CP developed.  Aminophylline given with resolution.    Interpretation to follow.  Maryanna Stuber PA-C

## 2013-11-08 NOTE — Consult Note (Signed)
Admit date: 11/07/2013 Referring Physician  Dr. Algis Liming Primary Physician  Dr. Junie Panning, DO Primary Cardiologist  Fransico Him, MD Reason for Consultation  CP  HPI: This is a 73 year old female with a history of hypertension, hyperlipidemia, CKD stage III, bipolar disorder, cardiac cath in 2005 that was normal and stress testing in 2013 and 2014 that was normal who presents with left-sided chest discomfort that began around midnight on 11/07/2013. Patient went to sleep and started sweating, and while getting up to change her bedding, the patient experienced left-sided chest discomfort that was sharp in nature. The patient is unclear how long it lasted, but states that it was on and off after that period of time. There were no exacerbating or alleviating factors. The patient had some associated nausea but denied any shortness of breath. She stated that she felt dizzy. The patient has not had any chest discomfort recently, she states that there has not been any decrease in her exercise tolerance. In the emergency department, the patient was pain-free without any shortness of breath or dizziness. The patient had a LexiScan stress test on 12/08/2012 which was negative with EF of 83%. She denies any fevers, chills, vomiting, diarrhea, abdominal pain, dysuria, hematuria, hematochezia, melena, headache, visual disturbance.  In the emergency department, BMP and CBC were unremarkable. Her hemoglobin was at baseline. Point of care troponin was negative. The patient was hemodynamically stable. Chest x-ray was negative. EKG showed sinus rhythm with T-wave inversion in V4-V6 which was unchanged from previous EKGs.  Through the night she has continued to have episodes of CP that she says are hard to describe that radiated down her arm into her fingers.      PMH:   Past Medical History  Diagnosis Date  . Depression   . Gout   . Bipolar 1 disorder   . Schizoaffective disorder   . Chronic kidney disease     . Hypertension   . Seizure disorder   . Thrombocytopenia   . Anemia      PSH:   Past Surgical History  Procedure Laterality Date  . Tubal ligation    . Appendectomy    . Cardiac catheterization  2005    Normal    Allergies:  Ativan; Benztropine mesylate; Clonazepam; Vicodin; Lisinopril; and Sulfonamide derivatives Prior to Admit Meds:   Prescriptions prior to admission  Medication Sig Dispense Refill  . Calcium Citrate-Vitamin D (CALCIUM CITRATE + D3 PO) Take 1 tablet by mouth 2 (two) times daily.      [name] diltiazem (CARDIZEM) 60 MG tablet Take 60 mg by mouth 3 (three) times daily.      . divalproex (DEPAKOTE ER) 500 MG 24 hr tablet Take 1 tablet (500 mg total) by mouth daily.  30 tablet  0  . Menthol, Topical Analgesic, (ICY HOT) 7.5 % (ROLL) MISC Apply 1 each topically as needed (for pain).      . Multiple Vitamins-Minerals (CENTRUM SILVER ADULT 50+ PO) Take 1 tablet by mouth daily.      . polyethylene glycol (MIRALAX / GLYCOLAX) packet Take 17 g by mouth daily as needed for mild constipation.      . polyvinyl alcohol (LIQUIFILM TEARS) 1.4 % ophthalmic solution Place 1 drop into both eyes daily as needed for dry eyes. For dry eyes      . risperiDONE (RISPERDAL) 2 MG tablet Take 2 mg by mouth at bedtime.      . vitamin C (ASCORBIC ACID) 500 MG tablet Take 500 mg by  mouth daily.       Fam HX:    Family History  Problem Relation Age of Onset  . Heart attack Mother   . Heart disease Mother   . Heart attack Father   . Heart disease Father   . Hypertension Brother    Social HX:    History   Social History  . Marital Status: Divorced    Spouse Name: N/A    Number of Children: N/A  . Years of Education: N/A   Occupational History  . Retired-retail sales     [name]   Social History Main Topics  . Smoking status: Never Smoker   . Smokeless tobacco: Never Used  . Alcohol Use: No  . Drug Use: No  . Sexual Activity: Not Currently   Other Topics Concern  . Not on file    Social History Narrative   Health Care POA: Encarnacion Slates, daughter   Emergency Contact: Clydell Hakim [phone]   End of Life Plan: DNR   Live by her self.   Any pets: none   Diet: Patient has a varied diet of protein, starch, and vegetables.   Exercise: Patient walks and uses stationary bike.   Hobbies: Reading the Bible      Lives in [place], uses medicaid transportation to get to appointments.   Is a Company secretary at [employer].                    ROS:  All 11 ROS were addressed and are negative except what is stated in the HPI  Physical Exam: Blood pressure 128/64, pulse 76, temperature 98.1 F (36.7 C), temperature source Oral, resp. rate 16, height 5\' 3"  (1.6 m), weight 146 lb (66.225 kg), SpO2 100.00%.    General: Well developed, well nourished, in no acute distress Head: Eyes PERRLA, No xanthomas.   Normal cephalic and atramatic  Lungs:   Clear bilaterally to auscultation and percussion. Heart:   HRRR S1 S2 Pulses are 2+ & equal.            No carotid bruit. No JVD.  No abdominal bruits. No femoral bruits. Abdomen: Bowel sounds are positive, abdomen soft and non-tender without masses  Extremities:   No clubbing, cyanosis or edema.  DP +1 Neuro: Alert and oriented X 3. Psych:  Good affect, responds appropriately    Labs:   Lab Results  Component Value Date   WBC 5.2 11/07/2013   HGB 11.6* 11/07/2013   HCT 36.4 11/07/2013   MCV 83.9 11/07/2013   PLT 215 11/07/2013    Recent Labs Lab 11/07/13 0338  NA 140  K 4.0  CL 100  CO2 27  BUN 21  CREATININE 0.93  CALCIUM 10.3  GLUCOSE 171*   No results found for this basename: PTT   Lab Results  Component Value Date   INR 1.01 01/12/2013   INR 0.94 05/13/2012   INR 0.9 10/21/2008   Lab Results  Component Value Date   CKTOTAL 558* 06/08/2010   CKMB 6.1 CRITICAL VALUE NOTED.  VALUE IS CONSISTENT WITH PREVIOUSLY REPORTED AND CALLED VALUE.* 06/08/2010   TROPONINI <0.30 11/07/2013     Lab Results  Component Value  Date   CHOL 188 11/07/2013   CHOL 227* 09/03/2013   CHOL 194 07/24/2011   Lab Results  Component Value Date   HDL 75 11/07/2013   HDL 70 09/03/2013   HDL 66 07/24/2011   Lab Results  Component Value Date  LDLCALC 89 11/07/2013   LDLCALC 130* 09/03/2013   LDLCALC 108* 07/24/2011   Lab Results  Component Value Date   TRIG 118 11/07/2013   TRIG 136 09/03/2013   TRIG 99 07/24/2011   Lab Results  Component Value Date   CHOLHDL 2.5 11/07/2013   CHOLHDL 3.2 09/03/2013   CHOLHDL 2.9 07/24/2011   Lab Results  Component Value Date   LDLDIRECT 114* 12/10/2006      Radiology:  Dg Chest Port 1 View  11/07/2013   CLINICAL DATA:  Chest pain.  EXAM: PORTABLE CHEST - 1 VIEW  COMPARISON:  Chest radiograph performed 01/26/2013  FINDINGS: The lungs are well-aerated and clear. There is no evidence of focal opacification, pleural effusion or pneumothorax.  The cardiomediastinal silhouette is within normal limits. No acute osseous abnormalities are seen.  IMPRESSION: No acute cardiopulmonary process seen.   Electronically Signed   By: Garald Balding M.D.   On: 11/07/2013 04:36    EKG:  NSR with ST/T wave abnormality c/w inferolateral ischemia.  ASSESSMENT:  1.  Atypical chest pain.  EKG has inferolateral T wave changes which are old. She had a nuclear stress test in 2013 and 2014 which showed no ischemia.  She had a normal cath in 2005 and normal nuclear stress test in 2013 and 2014.  She has continued to have CP intermittent through the night associated with radiation into her fingers.   2.  HTN 3.  CKD  PLAN:   1.  NPO 2.  Will get Leane Call since she is continuing to have intermittent CP with radiation into her left arm.  She has a very difficult time describing the pain to determined if it is musculoskeletal vs. GI vs. Cardiac.  Sueanne Margarita, MD  11/08/2013  8:07 AM

## 2013-11-08 NOTE — Progress Notes (Signed)
I have seen and examined this patient. I have discussed with Dr Awanda Mink.  I agree with their findings and plans as documented in their progress note. Awaiting Nuclear Med myocardial scan.  If unremarkable, then Discharge patient home with PPI for possible GERD basis of nocturnal substernal discomfort.

## 2013-11-08 NOTE — Progress Notes (Signed)
Intermediate risk nuclear stress test today.  The patient will be schedule for coronary angiography tomorrow.  Dawon Troop PA-C

## 2013-11-08 NOTE — Progress Notes (Signed)
Utilization review completed.  

## 2013-11-09 ENCOUNTER — Encounter (HOSPITAL_COMMUNITY)
Admission: EM | Disposition: A | Discharge status: Home / Self Care | Payer: Self-pay | Source: Home / Self Care | Attending: Family Medicine

## 2013-11-09 DIAGNOSIS — Z8249 Family history of ischemic heart disease and other diseases of the circulatory system: Secondary | ICD-10-CM | POA: Diagnosis not present

## 2013-11-09 DIAGNOSIS — R079 Chest pain, unspecified: Secondary | ICD-10-CM | POA: Diagnosis present

## 2013-11-09 DIAGNOSIS — N183 Chronic kidney disease, stage 3 unspecified: Secondary | ICD-10-CM | POA: Diagnosis not present

## 2013-11-09 DIAGNOSIS — Z885 Allergy status to narcotic agent status: Secondary | ICD-10-CM | POA: Diagnosis not present

## 2013-11-09 DIAGNOSIS — I129 Hypertensive chronic kidney disease with stage 1 through stage 4 chronic kidney disease, or unspecified chronic kidney disease: Secondary | ICD-10-CM | POA: Diagnosis not present

## 2013-11-09 DIAGNOSIS — G40909 Epilepsy, unspecified, not intractable, without status epilepticus: Secondary | ICD-10-CM | POA: Diagnosis not present

## 2013-11-09 DIAGNOSIS — E785 Hyperlipidemia, unspecified: Secondary | ICD-10-CM | POA: Diagnosis present

## 2013-11-09 DIAGNOSIS — Z882 Allergy status to sulfonamides status: Secondary | ICD-10-CM | POA: Diagnosis not present

## 2013-11-09 DIAGNOSIS — I2510 Atherosclerotic heart disease of native coronary artery without angina pectoris: Secondary | ICD-10-CM | POA: Diagnosis present

## 2013-11-09 DIAGNOSIS — R9439 Abnormal result of other cardiovascular function study: Secondary | ICD-10-CM

## 2013-11-09 DIAGNOSIS — K219 Gastro-esophageal reflux disease without esophagitis: Secondary | ICD-10-CM | POA: Diagnosis not present

## 2013-11-09 DIAGNOSIS — Z9861 Coronary angioplasty status: Secondary | ICD-10-CM | POA: Diagnosis not present

## 2013-11-09 DIAGNOSIS — E7800 Pure hypercholesterolemia, unspecified: Secondary | ICD-10-CM | POA: Diagnosis present

## 2013-11-09 DIAGNOSIS — Z888 Allergy status to other drugs, medicaments and biological substances status: Secondary | ICD-10-CM | POA: Diagnosis not present

## 2013-11-09 DIAGNOSIS — F259 Schizoaffective disorder, unspecified: Secondary | ICD-10-CM | POA: Diagnosis present

## 2013-11-09 DIAGNOSIS — F319 Bipolar disorder, unspecified: Secondary | ICD-10-CM | POA: Diagnosis present

## 2013-11-09 DIAGNOSIS — I209 Angina pectoris, unspecified: Secondary | ICD-10-CM | POA: Diagnosis present

## 2013-11-09 HISTORY — PX: CARDIAC CATHETERIZATION: SHX172

## 2013-11-09 HISTORY — PX: LEFT HEART CATHETERIZATION WITH CORONARY ANGIOGRAM: SHX5451

## 2013-11-09 LAB — PROTIME-INR
INR: 1.06 (ref 0.00–1.49)
PROTHROMBIN TIME: 13.8 s (ref 11.6–15.2)

## 2013-11-09 SURGERY — LEFT HEART CATHETERIZATION WITH CORONARY ANGIOGRAM
Anesthesia: LOCAL

## 2013-11-09 MED ORDER — SODIUM CHLORIDE 0.9 % IV SOLN: 250.0000 mL | INTRAVENOUS | Status: DC | PRN

## 2013-11-09 MED ORDER — NITROGLYCERIN 1 MG/10 ML FOR IR/CATH LAB
INTRA_ARTERIAL | Status: AC
  Filled 2013-11-09: qty 10

## 2013-11-09 MED ORDER — SODIUM CHLORIDE 0.9 % IJ SOLN
3.0000 mL | Freq: Two times a day (BID) | INTRAMUSCULAR | Status: DC
  Administered 2013-11-10 (×2): 3 mL via INTRAVENOUS

## 2013-11-09 MED ORDER — FENTANYL CITRATE 0.05 MG/ML IJ SOLN
INTRAMUSCULAR | Status: AC
  Filled 2013-11-09: qty 2

## 2013-11-09 MED ORDER — HEPARIN (PORCINE) IN NACL 2-0.9 UNIT/ML-% IJ SOLN
INTRAMUSCULAR | Status: AC
  Filled 2013-11-09: qty 1000

## 2013-11-09 MED ORDER — SODIUM CHLORIDE 0.9 % IV SOLN
1.0000 mL/kg/h | INTRAVENOUS | Status: AC
  Administered 2013-11-09: 1 mL/kg/h via INTRAVENOUS

## 2013-11-09 MED ORDER — SODIUM CHLORIDE 0.9 % IJ SOLN: 3.0000 mL | INTRAMUSCULAR | Status: DC | PRN

## 2013-11-09 MED ORDER — LIDOCAINE HCL (PF) 1 % IJ SOLN
INTRAMUSCULAR | Status: AC
  Filled 2013-11-09: qty 30

## 2013-11-09 MED ORDER — MIDAZOLAM HCL 2 MG/2ML IJ SOLN
INTRAMUSCULAR | Status: AC
  Filled 2013-11-09: qty 2

## 2013-11-09 MED ORDER — VERAPAMIL HCL 2.5 MG/ML IV SOLN
INTRAVENOUS | Status: AC
  Filled 2013-11-09: qty 2

## 2013-11-09 NOTE — Progress Notes (Signed)
Utilization review completed.  

## 2013-11-09 NOTE — Interval H&P Note (Signed)
History and Physical Interval Note:  11/09/2013 6:27 PM  [name]  has presented today for surgery, with the diagnosis of Chest Pain with Moderate Risk of Cardiac Etiology & Intermediate Risk Nuclear Stress Test. The various methods of treatment have been discussed with the patient and family. After consideration of risks, benefits and other options for treatment, the patient has consented to  Procedure(s): LEFT HEART CATHETERIZATION WITH CORONARY ANGIOGRAM (N/A) as a surgical intervention .  The patient's history has been reviewed, patient examined, no change in status, stable for surgery.  I have reviewed the patient's chart and labs.  Questions were answered to the patient's satisfaction.     HARDING,DAVID W  Cath Lab Visit (complete for each Cath Lab visit)  Clinical Evaluation Leading to the Procedure:   ACS: No.  Non-ACS:    Anginal Classification: CCS III  Anti-ischemic medical therapy: Minimal Therapy (1 class of medications)  Non-Invasive Test Results: Intermediate-risk stress test findings: cardiac mortality 1-3%/year  Prior CABG: No previous CABG

## 2013-11-09 NOTE — Progress Notes (Signed)
Subjective: Slept well.  No CP or SOB  Objective: Vital signs in last 24 hours: Temp:  [98.1 F (36.7 C)-98.4 F (36.9 C)] 98.1 F (36.7 C) (07/28 0447) Pulse Rate:  [75-83] 75 (07/28 0447) Resp:  [18-20] 18 (07/28 0447) BP: (123-153)/(58-86) 129/78 mmHg (07/28 0959) SpO2:  [96 %-100 %] 97 % (07/28 0447) Weight:  [145 lb 8.1 oz (66 kg)] 145 lb 8.1 oz (66 kg) (07/28 0447) Last BM Date: 11/08/13  Intake/Output from previous day: 07/27 0701 - 07/28 0700 In: 477 [P.O.:360; I.V.:117] Out: 850 [Urine:850] Intake/Output this shift:    Medications Current Facility-Administered Medications  Medication Dose Route Frequency Provider Last Rate Last Dose  . 0.9 %  sodium chloride infusion  250 mL Intravenous PRN Tarri Fuller, PA-C      . 0.9 %  sodium chloride infusion  1 mL/kg/hr Intravenous Continuous Tarri Fuller, PA-C 66.2 mL/hr at 11/09/13 0414 1 mL/kg/hr at 11/09/13 0414  . acetaminophen (TYLENOL) tablet 650 mg  650 mg Oral Q6H PRN Orson Eva, MD       Or  . acetaminophen (TYLENOL) suppository 650 mg  650 mg Rectal Q6H PRN Orson Eva, MD      . diltiazem (CARDIZEM) tablet 60 mg  60 mg Oral TID Orson Eva, MD   60 mg at 11/09/13 0959  . divalproex (DEPAKOTE ER) 24 hr tablet 500 mg  500 mg Oral Daily Orson Eva, MD   500 mg at 11/09/13 1000  . enoxaparin (LOVENOX) injection 40 mg  40 mg Subcutaneous Q24H Orson Eva, MD   40 mg at 11/08/13 2140  . morphine 2 MG/ML injection 2 mg  2 mg Intravenous Q1H PRN Mariea Clonts, MD   2 mg at 11/07/13 0740  . multivitamin with minerals tablet 1 tablet  1 tablet Oral Daily Orson Eva, MD   1 tablet at 11/09/13 1000  . nitroGLYCERIN (NITROSTAT) SL tablet 0.4 mg  0.4 mg Sublingual Q5 min PRN Etta Quill, DO   0.4 mg at 11/08/13 1239  . ondansetron (ZOFRAN) tablet 4 mg  4 mg Oral Q6H PRN Orson Eva, MD       Or  . ondansetron (ZOFRAN) injection 4 mg  4 mg Intravenous Q6H PRN Orson Eva, MD      . polyethylene glycol (MIRALAX / GLYCOLAX) packet 17 g   17 g Oral Daily PRN Orson Eva, MD   17 g at 11/08/13 1722  . polyvinyl alcohol (LIQUIFILM TEARS) 1.4 % ophthalmic solution 1 drop  1 drop Both Eyes Daily PRN Orson Eva, MD      . risperiDONE (RISPERDAL) tablet 2 mg  2 mg Oral QHS Orson Eva, MD   2 mg at 11/08/13 2139  . sodium chloride 0.9 % injection 3 mL  3 mL Intravenous Q12H Orson Eva, MD   3 mL at 11/08/13 2142  . sodium chloride 0.9 % injection 3 mL  3 mL Intravenous Q12H Bryan Hager, PA-C      . sodium chloride 0.9 % injection 3 mL  3 mL Intravenous PRN Tarri Fuller, PA-C      . vitamin C (ASCORBIC ACID) tablet 500 mg  500 mg Oral Daily Orson Eva, MD   500 mg at 11/09/13 1001    PE: General appearance: alert, cooperative and no distress Lungs: clear to auscultation bilaterally Heart: regular rate and rhythm, S1, S2 normal, no murmur, click, rub or gallop Extremities: No LEE Pulses: 2+ and symmetric Skin: Warm and dry Neurologic:  Grossly normal  Lab Results:   Recent Labs  11/07/13 0338  WBC 5.2  HGB 11.6*  HCT 36.4  PLT 215   BMET  Recent Labs  11/07/13 0338  NA 140  K 4.0  CL 100  CO2 27  GLUCOSE 171*  BUN 21  CREATININE 0.93  CALCIUM 10.3   PT/INR  Recent Labs  11/09/13 0345  LABPROT 13.8  INR 1.06   Cholesterol  Recent Labs  11/07/13 1219  CHOL 188   Lipid Panel     Component Value Date/Time   CHOL 188 11/07/2013 1219   TRIG 118 11/07/2013 1219   HDL 75 11/07/2013 1219   CHOLHDL 2.5 11/07/2013 1219   VLDL 24 11/07/2013 1219   LDLCALC 89 11/07/2013 1219    Assessment/Plan   Principal Problem:   Atypical chest pain Active Problems:   HYPERCHOLESTEROLEMIA   BIPOLAR DISORDER   HYPERTENSION, BENIGN SYSTEMIC   CHRONIC KIDNEY DISEASE STAGE III (MODERATE)   Acute chest pain  Plan:  Intermediate risk nuclear stress test yesterday.  Left heart cath today at 150hrs.  BP and HR stable.    LOS: 2 days    HAGER, BRYAN PA-C 11/09/2013 10:20 AM  I have personally seen and examined this  patient. I agree with the assessment and plan as outlined above. Abnormal stress test. Recent chest pain concerning for unstable angina. Plan cardiac cath this afternoon. All questions answered. Labs reviewed.   Eboni Coval 11/09/2013 12:28 PM

## 2013-11-09 NOTE — Progress Notes (Signed)
Family Medicine Teaching Service Daily Progress Note Intern Pager: [phone]  Patient name: [name] Medical record number: [id number] Date of birth: 06/01/1940 Age: 73 y.o. Gender: female  Primary Care Provider: Lorna Few, DO Consultants: Cardiology  Code Status: Full   Pt Overview and Major Events to Date:  7/27: Lexiscan Myoview completed  7/28: Cardiac Cath scheduled  Assessment and Plan: 73 year old female with a history of hypertension, hyperlipidemia, CKD stage III, bipolar disorder presented with left-sided chest discomfort.  #Atypical Angina - Pt r/o for acute coronary syndrome w/o changes that are acute on her EKG -Continue with telemetry - Consult to cards, appreciate recs - Lexiscan 7/27: pt did not tolerate well, developed severe CP; had resolution w/ Aminophylline - Consider Echocardiogram in outpt setting with cardiology f/u - Consider starting ASA for primary CVD PPx - Lipid Panel completed, ASCVD at 4.9% 10 yr risk - Cardiac cath to be preformed 7/28  #HTN -  -Stable continue home meds  #Bipolar -Stable continue home meds  #CKD Stage III - Creatinine stable  FEN/GI: Heart Healthy Diet PPx: Heparin SQ  Disposition: Telemetry pending Myoview interpretation   Subjective:  Pt with CP during her test, otherwise no complaints  Objective: Temp:  [98.1 F (36.7 C)-98.4 F (36.9 C)] 98.1 F (36.7 C) (07/28 0447) Pulse Rate:  [75-83] 75 (07/28 0447) Resp:  [18-20] 18 (07/28 0447) BP: (129-151)/(58-86) 129/78 mmHg (07/28 0959) SpO2:  [96 %-100 %] 97 % (07/28 0447) Weight:  [145 lb 8.1 oz (66 kg)] 145 lb 8.1 oz (66 kg) (07/28 0447) Physical Exam: General: WN/WD, NAD  Head: Eyes PERRLA, No xanthomas. Normal cephalic and atramatic  Lungs: CTAB  Heart: RRR S1 S2  No carotid bruit. No JVD.  Abdomen: Bowel sounds are positive, abdomen soft and non-tender without masses  Extremities: No clubbing, cyanosis or edema. DP +2  Neuro: Alert and  oriented X 3.  Psych: Good affect, responds appropriately  Laboratory:  Recent Labs Lab 11/07/13 0338  WBC 5.2  HGB 11.6*  HCT 36.4  PLT 215    Recent Labs Lab 11/07/13 0338  NA 140  K 4.0  CL 100  CO2 27  BUN 21  CREATININE 0.93  CALCIUM 10.3  GLUCOSE 171*    Imaging/Diagnostic Tests: Cardiac Lexiscan Pending   Elberta Leatherwood, MD 11/09/2013, 1:04 PM PGY-1, [place] Intern pager: [phone], text pages welcome

## 2013-11-09 NOTE — Progress Notes (Signed)
Chaplain responded to pt request for chaplain visit. Pt states she is scheduled for procedure in cath lab at 3:00 today and requested prayer. We had prayer together that the procedure go well. Pt also requested a Bible, and I brought her one from the nurses station.

## 2013-11-09 NOTE — Interval H&P Note (Signed)
Stress Test Result:  Intermediate risk nuclear study with chest pain and significant ST  depression with lexiscan infusion. The scintigraphic results showed  inferior lateral thinning but no ischemia. The gated ejection  fraction was 83%; normal wall motion. Given ST changes with infusion  further ischemia evaluation with cardiac catheterization may be  indicated.  HARDING,DAVID W

## 2013-11-09 NOTE — H&P (View-Only) (Signed)
Subjective: Slept well.  No CP or SOB  Objective: Vital signs in last 24 hours: Temp:  [98.1 F (36.7 C)-98.4 F (36.9 C)] 98.1 F (36.7 C) (07/28 0447) Pulse Rate:  [75-83] 75 (07/28 0447) Resp:  [18-20] 18 (07/28 0447) BP: (123-153)/(58-86) 129/78 mmHg (07/28 0959) SpO2:  [96 %-100 %] 97 % (07/28 0447) Weight:  [145 lb 8.1 oz (66 kg)] 145 lb 8.1 oz (66 kg) (07/28 0447) Last BM Date: 11/08/13  Intake/Output from previous day: 07/27 0701 - 07/28 0700 In: 477 [P.O.:360; I.V.:117] Out: 850 [Urine:850] Intake/Output this shift:    Medications Current Facility-Administered Medications  Medication Dose Route Frequency Provider Last Rate Last Dose  . 0.9 %  sodium chloride infusion  250 mL Intravenous PRN Tarri Fuller, PA-C      . 0.9 %  sodium chloride infusion  1 mL/kg/hr Intravenous Continuous Tarri Fuller, PA-C 66.2 mL/hr at 11/09/13 0414 1 mL/kg/hr at 11/09/13 0414  . acetaminophen (TYLENOL) tablet 650 mg  650 mg Oral Q6H PRN Orson Eva, MD       Or  . acetaminophen (TYLENOL) suppository 650 mg  650 mg Rectal Q6H PRN Orson Eva, MD      . diltiazem (CARDIZEM) tablet 60 mg  60 mg Oral TID Orson Eva, MD   60 mg at 11/09/13 0959  . divalproex (DEPAKOTE ER) 24 hr tablet 500 mg  500 mg Oral Daily Orson Eva, MD   500 mg at 11/09/13 1000  . enoxaparin (LOVENOX) injection 40 mg  40 mg Subcutaneous Q24H Orson Eva, MD   40 mg at 11/08/13 2140  . morphine 2 MG/ML injection 2 mg  2 mg Intravenous Q1H PRN Mariea Clonts, MD   2 mg at 11/07/13 0740  . multivitamin with minerals tablet 1 tablet  1 tablet Oral Daily Orson Eva, MD   1 tablet at 11/09/13 1000  . nitroGLYCERIN (NITROSTAT) SL tablet 0.4 mg  0.4 mg Sublingual Q5 min PRN Etta Quill, DO   0.4 mg at 11/08/13 1239  . ondansetron (ZOFRAN) tablet 4 mg  4 mg Oral Q6H PRN Orson Eva, MD       Or  . ondansetron (ZOFRAN) injection 4 mg  4 mg Intravenous Q6H PRN Orson Eva, MD      . polyethylene glycol (MIRALAX / GLYCOLAX) packet 17 g   17 g Oral Daily PRN Orson Eva, MD   17 g at 11/08/13 1722  . polyvinyl alcohol (LIQUIFILM TEARS) 1.4 % ophthalmic solution 1 drop  1 drop Both Eyes Daily PRN Orson Eva, MD      . risperiDONE (RISPERDAL) tablet 2 mg  2 mg Oral QHS Orson Eva, MD   2 mg at 11/08/13 2139  . sodium chloride 0.9 % injection 3 mL  3 mL Intravenous Q12H Orson Eva, MD   3 mL at 11/08/13 2142  . sodium chloride 0.9 % injection 3 mL  3 mL Intravenous Q12H Bryan Hager, PA-C      . sodium chloride 0.9 % injection 3 mL  3 mL Intravenous PRN Tarri Fuller, PA-C      . vitamin C (ASCORBIC ACID) tablet 500 mg  500 mg Oral Daily Orson Eva, MD   500 mg at 11/09/13 1001    PE: General appearance: alert, cooperative and no distress Lungs: clear to auscultation bilaterally Heart: regular rate and rhythm, S1, S2 normal, no murmur, click, rub or gallop Extremities: No LEE Pulses: 2+ and symmetric Skin: Warm and dry Neurologic:  Grossly normal  Lab Results:   Recent Labs  11/07/13 0338  WBC 5.2  HGB 11.6*  HCT 36.4  PLT 215   BMET  Recent Labs  11/07/13 0338  NA 140  K 4.0  CL 100  CO2 27  GLUCOSE 171*  BUN 21  CREATININE 0.93  CALCIUM 10.3   PT/INR  Recent Labs  11/09/13 0345  LABPROT 13.8  INR 1.06   Cholesterol  Recent Labs  11/07/13 1219  CHOL 188   Lipid Panel     Component Value Date/Time   CHOL 188 11/07/2013 1219   TRIG 118 11/07/2013 1219   HDL 75 11/07/2013 1219   CHOLHDL 2.5 11/07/2013 1219   VLDL 24 11/07/2013 1219   LDLCALC 89 11/07/2013 1219    Assessment/Plan   Principal Problem:   Atypical chest pain Active Problems:   HYPERCHOLESTEROLEMIA   BIPOLAR DISORDER   HYPERTENSION, BENIGN SYSTEMIC   CHRONIC KIDNEY DISEASE STAGE III (MODERATE)   Acute chest pain  Plan:  Intermediate risk nuclear stress test yesterday.  Left heart cath today at 150hrs.  BP and HR stable.    LOS: 2 days    HAGER, BRYAN PA-C 11/09/2013 10:20 AM  I have personally seen and examined this  patient. I agree with the assessment and plan as outlined above. Abnormal stress test. Recent chest pain concerning for unstable angina. Plan cardiac cath this afternoon. All questions answered. Labs reviewed.   [name] 11/09/2013 12:28 PM

## 2013-11-09 NOTE — CV Procedure (Addendum)
CARDIAC CATHETERIZATION REPORT  NAME:  [name]   MRN: [id number] DOB:  [birth year]   ADMIT DATE: 11/07/2013 Procedure Date: 11/09/2013  INTERVENTIONAL CARDIOLOGIST: Leonie Man, M.D., MS PRIMARY CARE PROVIDER: Lorna Few, DO PRIMARY CARDIOLOGIST:  Fransico Him, MD  PATIENT:  [name] is a 73 y.o. female admitted for chest pain that awoke her from sleep. She had left-sided chest discomfort on the evening of July 26. She has a history of normal stress test in 2013 2014, however stress test during this hospitalization was read as intermediate risk with possible inferior ischemia. She is therefore referred for invasive coronary evaluation to clarify her anatomy.  PRE-OPERATIVE DIAGNOSIS:     Intermediate Risk Myoview Stress Test  PROCEDURES PERFORMED:    Left Heart Catheterization with Native Coronary Angiography via Right Radial Artery   PROCEDURE: The patient was brought to the [facility] in the fasting state and prepped and draped in the usual sterile fashion for Right Radial artery access. A modified Allen's test was performed on the Right wrist demonstrating excellent collateral flow for radial access.   Sterile technique was used including antiseptics, cap, gloves, gown, hand hygiene, mask and sheet. Skin prep: Chlorhexidine.   Consent: Risks of procedure as well as the alternatives and risks of each were explained to the (patient/caregiver). Consent for procedure obtained.   Time Out: Verified patient identification, verified procedure, site/side was marked, verified correct patient position, special equipment/implants available, medications/allergies/relevent history reviewed, required imaging and test results available. Performed.  Access:   Right Radial Artery: 6 Fr Sheath -  Technique (Angiocath Micropuncture Kit)  Radial Cocktail - 10 mL; IV Heparin 3500 Units   Left Heart Catheterization: 5Fr Catheters advanced or  exchanged over a Long Exchange Safety J-wire; TIG 4.0 catheter advanced first.  Right Coronary Artery Cineangiography:  Catheter  Left Coronary Artery Cineangiography: EBU 3.0 Catheter   LV Hemodynamics: TIG 4.0  Sheath removed in the Cath Lab with TR Band application for hemostasis.  TR Band: 1920  Hours; 12 mL air  FINDINGS:  Hemodynamics:   Central Aortic Pressure / Mean: 121/54/82 mmHg  Left Ventricular Pressure / LVEDP: 118/9/17 mmHg  Left Ventriculography: Deferred as Echo & Nuclear ST both done this Admission  Coronary Anatomy:  Dominance: Right  Left Main: Large caliber vessel that bifurcates into the LAD and Circumflex; angiographically normal LAD: Large caliber vessel that wraps the apex fusing the distal third of the appear apex. There is a moderate large caliber mid vessel D1 and a smaller more distal D2. There are 2 apical diagonal branches as well. The entire LAD system is free of disease.  Left Circumflex: Large caliber, nondominant vessel that bifurcates proximally into a large lateral OM1 with several branches and the introducer complex was terminates as 3 small posterolateral branches. Minimal luminal irregularities.   RCA: Large caliber, dominant vessel of large RV marginal and a more proximal atrial branch that appears to course over to the left atrium. The distal RCA bifurcates into moderate caliber tortuous Right Posterior Descending Artery (RPDA) and the  Right Posterior AV Groove Branch (RPAV). Both ears look tortuous but free of any significant disease.  PATIENT DISPOSITION:    The patient was transferred to the PACU holding area in a hemodynamicaly stable, chest pain free condition.  The patient tolerated the procedure well, and there were no complications.  EBL:   < 5 ml  The patient was stable before, during, and  after the procedure.  POST-OPERATIVE DIAGNOSIS:    Normal coronary arteries with no culprit lesion to explain either the patient's chest  discomfort symptoms or the abnormal Myoview.  False Positive Myoview  Mild to moderately elevated LVEDP with normal EF by Echocardiogram   PLAN OF CARE:  Standard post radial cath care with TR band removal  Would anticipate discharge either later on tonight or in the morning.  Would not evaluate further evaluate for ischemic etiology of chest pain.   Leonie Man, M.D., M.S. [facility] [address] [place], [place]  [address]  [phone]  11/09/2013 7:24 PM

## 2013-11-09 NOTE — Progress Notes (Signed)
I discussed with  Dr McKeag.  I agree with their plans documented in their progress note.  

## 2013-11-10 ENCOUNTER — Encounter (HOSPITAL_COMMUNITY): Payer: Self-pay | Admitting: Cardiology

## 2013-11-10 DIAGNOSIS — Z0389 Encounter for observation for other suspected diseases and conditions ruled out: Secondary | ICD-10-CM

## 2013-11-10 DIAGNOSIS — IMO0001 Reserved for inherently not codable concepts without codable children: Secondary | ICD-10-CM

## 2013-11-10 NOTE — Discharge Instructions (Signed)
You were admitted to the hospital with atypical chest pain.  You had a stress test done which did not show areas of injury.  You will need to follow up with your primary care doctor as scheduled.   Radial Site Care Refer to this sheet in the next few weeks. These instructions provide you with information on caring for yourself after your procedure. Your caregiver may also give you more specific instructions. Your treatment has been planned according to current medical practices, but problems sometimes occur. Call your caregiver if you have any problems or questions after your procedure. HOME CARE INSTRUCTIONS  You may shower the day after the procedure.Remove the bandage (dressing) and gently wash the site with plain soap and water.Gently pat the site dry.  Do not apply powder or lotion to the site.  Do not submerge the affected site in water for 3 to 5 days.  Inspect the site at least twice daily.  Do not flex or bend the affected arm for 24 hours.  No lifting over 5 pounds (2.3 kg) for 5 days after your procedure.  Do not drive home if you are discharged the same day of the procedure. Have someone else drive you.  You may drive 24 hours after the procedure unless otherwise instructed by your caregiver.  Do not operate machinery or power tools for 24 hours.  A responsible adult should be with you for the first 24 hours after you arrive home. What to expect:  Any bruising will usually fade within 1 to 2 weeks.  Blood that collects in the tissue (hematoma) may be painful to the touch. It should usually decrease in size and tenderness within 1 to 2 weeks. SEEK IMMEDIATE MEDICAL CARE IF:  You have unusual pain at the radial site.  You have redness, warmth, swelling, or pain at the radial site.  You have drainage (other than a small amount of blood on the dressing).  You have chills.  You have a fever or persistent symptoms for more than 72 hours.  You have a fever and your  symptoms suddenly get worse.  Your arm becomes pale, cool, tingly, or numb.  You have heavy bleeding from the site. Hold pressure on the site. Document Released: 05/04/2010 Document Revised: 06/24/2011 Document Reviewed: 05/04/2010 [facility] Patient Information 2015 [place], [place]. This information is not intended to replace advice given to you by your health care provider. Make sure you discuss any questions you have with your health care provider.  Cardiac Diet This diet can help prevent heart disease and stroke. Many factors influence your heart health, including eating and exercise habits. Coronary risk rises a lot with abnormal blood fat (lipid) levels. Cardiac meal planning includes limiting unhealthy fats, increasing healthy fats, and making other small dietary changes. General guidelines are as follows:  Adjust calorie intake to reach and maintain desirable body weight.  Limit total fat intake to less than 30% of total calories. Saturated fat should be less than 7% of calories.  Saturated fats are found in animal products and in some vegetable products. Saturated vegetable fats are found in coconut oil, cocoa butter, palm oil, and palm kernel oil. Read labels carefully to avoid these products as much as possible. Use butter in moderation. Choose tub margarines and oils that have 2 grams of fat or less. Good cooking oils are canola and olive oils.  Practice low-fat cooking techniques. Do not fry food. Instead, broil, bake, boil, steam, grill, roast on a rack, stir-fry, or  microwave it. Other fat reducing suggestions include:  Remove the skin from poultry.  Remove all visible fat from meats.  Skim the fat off stews, soups, and gravies before serving them.  Steam vegetables in water or broth instead of sauting them in fat.  Avoid foods with trans fat (or hydrogenated oils), such as commercially fried foods and commercially baked goods. Commercial shortening and deep-frying fats will  contain trans fat.  Increase intake of fruits, vegetables, whole grains, and legumes to replace foods high in fat.  Increase consumption of nuts, legumes, and seeds to at least 4 servings weekly. One serving of a legume equals  cup, and 1 serving of nuts or seeds equals  cup.  Choose whole grains more often. Have 3 servings per day (a serving is 1 ounce [oz]).  Eat 4 to 5 servings of vegetables per day. A serving of vegetables is 1 cup of raw leafy vegetables;  cup of raw or cooked cut-up vegetables;  cup of vegetable juice.  Eat 4 to 5 servings of fruit per day. A serving of fruit is 1 medium whole fruit;  cup of dried fruit;  cup of fresh, frozen, or canned fruit;  cup of 100% fruit juice.  Increase your intake of dietary fiber to 20 to 30 grams per day. Insoluble fiber may help lower your risk of heart disease and may help curb your appetite.  Soluble fiber binds cholesterol to be removed from the blood. Foods high in soluble fiber are dried beans, citrus fruits, oats, apples, bananas, broccoli, Brussels sprouts, and eggplant.  Try to include foods fortified with plant sterols or stanols, such as yogurt, breads, juices, or margarines. Choose several fortified foods to achieve a daily intake of 2 to 3 grams of plant sterols or stanols.  Foods with omega-3 fats can help reduce your risk of heart disease. Aim to have a 3.5 oz portion of fatty fish twice per week, such as salmon, mackerel, albacore tuna, sardines, lake trout, or herring. If you wish to take a fish oil supplement, choose one that contains 1 gram of both DHA and EPA.  Limit processed meats to 2 servings (3 oz portion) weekly.  Limit the sodium in your diet to 1500 milligrams (mg) per day. If you have high blood pressure, talk to a registered dietitian about a DASH (Dietary Approaches to Stop Hypertension) eating plan.  Limit sweets and beverages with added sugar, such as soda, to no more than 5 servings per week. One  serving is:   1 tablespoon sugar.  1 tablespoon jelly or jam.   cup sorbet.  1 cup lemonade.   cup regular soda. CHOOSING FOODS Starches  Allowed: Breads: All kinds (wheat, rye, raisin, white, oatmeal, [place], [place], and English muffin bread). Low-fat rolls: English muffins, frankfurter and hamburger buns, bagels, pita bread, tortillas (not fried). Pancakes, waffles, biscuits, and muffins made with recommended oil.  Avoid: Products made with saturated or trans fats, oils, or whole milk products. Butter rolls, cheese breads, croissants. Commercial doughnuts, muffins, sweet rolls, biscuits, waffles, pancakes, store-bought mixes. Crackers  Allowed: Low-fat crackers and snacks: Animal, graham, rye, saltine (with recommended oil, no lard), oyster, and matzo crackers. Bread sticks, melba toast, rusks, flatbread, pretzels, and light popcorn.  Avoid: High-fat crackers: cheese crackers, butter crackers, and those made with coconut, palm oil, or trans fat (hydrogenated oils). Buttered popcorn. Cereals  Allowed: Hot or cold whole-grain cereals.  Avoid: Cereals containing coconut, hydrogenated vegetable fat, or animal fat. Potatoes / Building services engineer /  Rice  Allowed: All kinds of potatoes, rice, and pasta (such as macaroni, spaghetti, and noodles).  Avoid: Pasta or rice prepared with cream sauce or high-fat cheese. Chow mein noodles, [place] fries. Vegetables  Allowed: All vegetables and vegetable juices.  Avoid: Fried vegetables. Vegetables in cream, butter, or high-fat cheese sauces. Limit coconut. Fruit in cream or custard. Protein  Allowed: Limit your intake of meat, seafood, and poultry to no more than 6 oz (cooked weight) per day. All lean, well-trimmed beef, veal, pork, and lamb. All chicken and [place] without skin. All fish and shellfish. Wild game: wild duck, rabbit, pheasant, and venison. Egg whites or low-cholesterol egg substitutes may be used as desired. Meatless dishes: recipes with  dried beans, peas, lentils, and tofu (soybean curd). Seeds and nuts: all seeds and most nuts.  Avoid: Prime grade and other heavily marbled and fatty meats, such as short ribs, spare ribs, rib eye roast or steak, frankfurters, sausage, bacon, and high-fat luncheon meats, mutton. Caviar. Commercially fried fish. Domestic duck, goose, venison sausage. Organ meats: liver, gizzard, heart, chitterlings, brains, kidney, sweetbreads. Dairy  Allowed: Low-fat cheeses: nonfat or low-fat cottage cheese (1% or 2% fat), cheeses made with part skim milk, such as mozzarella, farmers, string, or ricotta. (Cheeses should be labeled no more than 2 to 6 grams fat per oz.). Skim (or 1%) milk: liquid, powdered, or evaporated. Buttermilk made with low-fat milk. Drinks made with skim or low-fat milk or cocoa. Chocolate milk or cocoa made with skim or low-fat (1%) milk. Nonfat or low-fat yogurt.  Avoid: Whole milk cheeses, including colby, cheddar, [place], Monte[place] Jack, [place], [place], [place], American, Swiss, and blue. Creamed cottage cheese, cream cheese. Whole milk and whole milk products, including buttermilk or yogurt made from whole milk, drinks made from whole milk. Condensed milk, evaporated whole milk, and 2% milk. Soups and Combination Foods  Allowed: Low-fat low-sodium soups: broth, dehydrated soups, homemade broth, soups with the fat removed, homemade cream soups made with skim or low-fat milk. Low-fat spaghetti, lasagna, chili, and Spanish rice if low-fat ingredients and low-fat cooking techniques are used.  Avoid: Cream soups made with whole milk, cream, or high-fat cheese. All other soups. Desserts and Sweets  Allowed: Sherbet, fruit ices, gelatins, meringues, and angel food cake. Homemade desserts with recommended fats, oils, and milk products. Jam, jelly, honey, marmalade, sugars, and syrups. Pure sugar candy, such as gum drops, hard candy, jelly beans, marshmallows, mints, and small amounts of dark  chocolate.  Avoid: Commercially prepared cakes, pies, cookies, frosting, pudding, or mixes for these products. Desserts containing whole milk products, chocolate, coconut, lard, palm oil, or palm kernel oil. Ice cream or ice cream drinks. Candy that contains chocolate, coconut, butter, hydrogenated fat, or unknown ingredients. Buttered syrups. Fats and Oils  Allowed: Vegetable oils: safflower, sunflower, corn, soybean, cottonseed, sesame, canola, olive, or peanut. Non-hydrogenated margarines. Salad dressing or mayonnaise: homemade or commercial, made with a recommended oil. Low or nonfat salad dressing or mayonnaise.  Limit added fats and oils to 6 to 8 tsp per day (includes fats used in cooking, baking, salads, and spreads on bread). Remember to count the "hidden fats" in foods.  Avoid: Solid fats and shortenings: butter, lard, salt pork, bacon drippings. Gravy containing meat fat, shortening, or suet. Cocoa butter, coconut. Coconut oil, palm oil, palm kernel oil, or hydrogenated oils: these ingredients are often used in bakery products, nondairy creamers, whipped toppings, candy, and commercially fried foods. Read labels carefully. Salad dressings made of unknown oils, sour  cream, or cheese, such as blue cheese and [place]ef[place]. Cream, all kinds: half-and-half, light, heavy, or whipping. Sour cream or cream cheese (even if "light" or low-fat). Nondairy cream substitutes: coffee creamers and sour cream substitutes made with palm, palm kernel, hydrogenated oils, or coconut oil. Beverages  Allowed: Coffee (regular or decaffeinated), tea. Diet carbonated beverages, mineral water. Alcohol: Check with your caregiver. Moderation is recommended.  Avoid: Whole milk, regular sodas, and juice drinks with added sugar. Condiments  Allowed: All seasonings and condiments. Cocoa powder. "Cream" sauces made with recommended ingredients.  Avoid: Carob powder made with hydrogenated fats. SAMPLE MENU Breakfast    cup orange juice   cup oatmeal  1 slice toast  1 tsp margarine  1 cup skim milk Lunch  [place] sandwich with 2 oz [place], 2 slices bread  Lettuce and tomato slices  Fresh fruit  Carrot sticks  Coffee or tea Snack  Fresh fruit or low-fat crackers Dinner  3 oz lean ground beef  1 baked potato  1 tsp margarine   cup asparagus  Lettuce salad  1 tbs non-creamy dressing   cup peach slices  1 cup skim milk Document Released: 01/09/2008 Document Revised: 10/01/2011 Document Reviewed: 06/01/2013 [employer] Patient Information 2015 [place], [place]. This information is not intended to replace advice given to you by your health care provider. Make sure you discuss any questions you have with your health care provider.

## 2013-11-10 NOTE — Progress Notes (Signed)
TR Band removed at 0204.  Right radial site a level 0, no bleeding noted.  Gauze and Tegaderm applied.  Vitals stable.  Will continue to monitor.

## 2013-11-10 NOTE — Discharge Summary (Addendum)
I discussed with  Dr Alease Frame.  I agree with their plans documented in their discharge note.  Possible GERD as cause of patient's chest pain.

## 2013-11-10 NOTE — Progress Notes (Signed)
Chaplain did follow-up visit to pt seen yesterday when she wanted prayer for upcoming procedure in cath lab. Pt states that cath lab procedure showed no blockage. She is very happy with this result and is thanking God. Pt thanked me for Bible left yesterday at her request. She said she has been reading it. Pt states she anticipates going home today.

## 2013-11-10 NOTE — Progress Notes (Signed)
    Subjective:  C/O "swelling Rt hand (radial cath)  Objective:  Vital Signs in the last 24 hours: Temp:  [97.6 F (36.4 C)-98.1 F (36.7 C)] 98.1 F (36.7 C) (07/29 0557) Pulse Rate:  [59-90] 66 (07/29 0557) Resp:  [18-20] 18 (07/29 0557) BP: (104-150)/(44-87) 139/62 mmHg (07/29 1050) SpO2:  [90 %-98 %] 95 % (07/29 0557) Weight:  [145 lb 11.6 oz (66.1 kg)] 145 lb 11.6 oz (66.1 kg) (07/29 0557)  Intake/Output from previous day:  Intake/Output Summary (Last 24 hours) at 11/10/13 1149 Last data filed at 11/10/13 0430  Gross per 24 hour  Intake   1326 ml  Output   1875 ml  Net   -549 ml    Physical Exam: Rt radial site without hematoma, Rt hand cool but not swollen   Rate: 68  Rhythm: normal sinus rhythm  Lab Results: No results found for this basename: WBC, HGB, PLT,  in the last 72 hours No results found for this basename: NA, K, CL, CO2, GLUCOSE, BUN, CREATININE,  in the last 72 hours  Recent Labs  11/07/13 1845 11/07/13 2300  TROPONINI <0.30 <0.30    Recent Labs  11/09/13 0345  INR 1.06    Imaging: Imaging results have been reviewed  Cardiac Studies:  Assessment/Plan:   Principal Problem:   Atypical chest pain Active Problems:   Normal coronary arteries 11/09/13   HYPERCHOLESTEROLEMIA   BIPOLAR DISORDER   HYPERTENSION, BENIGN SYSTEMIC   CHRONIC KIDNEY DISEASE STAGE III (MODERATE)   Chest pain with moderate risk for cardiac etiology    PLAN: MD to review, I think her cath site is OK. We will arrange follow up with Dr Radford Pax.   Kerin Ransom PA-C Beeper [phone] 11/10/2013, 11:49 AM   I have personally seen and examined this patient with Kerin Ransom, PA-C I agree with the assessment and plan as outlined above. Right radial cath site is ok. Mild pain. Good pulses. No hematoma. OK to d/c home.   MCALHANY,CHRISTOPHER 12:03 PM 11/10/2013

## 2013-11-10 NOTE — Progress Notes (Signed)
Pt reported swelling in right hand.  Upon inspection by RN, minimal swelling noted in right hand.  Pt denies pain or loss of sensation.  Radial pulse palpable and all fingers warm with less than 3 second capillary refill.  Pt able to move all fingers and ball hand into a fist.  MD on call notified and will come to see pt. Will continue to monitor.

## 2013-11-18 DIAGNOSIS — M79609 Pain in unspecified limb: Secondary | ICD-10-CM | POA: Diagnosis not present

## 2013-11-18 DIAGNOSIS — B351 Tinea unguium: Secondary | ICD-10-CM | POA: Diagnosis not present

## 2013-11-24 ENCOUNTER — Other Ambulatory Visit: Payer: Self-pay | Admitting: *Deleted

## 2013-11-24 MED ORDER — POLYETHYLENE GLYCOL 3350 17 G PO PACK: 17.0000 g | PACK | Freq: Every day | ORAL | Status: DC | PRN

## 2013-11-25 ENCOUNTER — Inpatient Hospital Stay: Payer: Self-pay | Admitting: Family Medicine

## 2013-12-15 ENCOUNTER — Other Ambulatory Visit: Payer: Self-pay | Admitting: Cardiology

## 2013-12-28 DIAGNOSIS — F319 Bipolar disorder, unspecified: Secondary | ICD-10-CM | POA: Diagnosis not present

## 2013-12-30 ENCOUNTER — Encounter: Payer: Self-pay | Admitting: Cardiology

## 2013-12-30 ENCOUNTER — Ambulatory Visit (INDEPENDENT_AMBULATORY_CARE_PROVIDER_SITE_OTHER): Payer: Medicare Other | Admitting: Cardiology

## 2013-12-30 VITALS — BP 130/62 | HR 80 | Ht 63.0 in | Wt 149.8 lb

## 2013-12-30 DIAGNOSIS — K219 Gastro-esophageal reflux disease without esophagitis: Secondary | ICD-10-CM | POA: Insufficient documentation

## 2013-12-30 DIAGNOSIS — R0789 Other chest pain: Secondary | ICD-10-CM

## 2013-12-30 DIAGNOSIS — I10 Essential (primary) hypertension: Secondary | ICD-10-CM | POA: Diagnosis not present

## 2013-12-30 MED ORDER — NITROGLYCERIN 0.4 MG SL SUBL: 0.4000 mg | SUBLINGUAL_TABLET | SUBLINGUAL | Status: DC | PRN

## 2013-12-30 MED ORDER — PANTOPRAZOLE SODIUM 40 MG PO TBEC: 40.0000 mg | DELAYED_RELEASE_TABLET | Freq: Every day | ORAL | Status: DC

## 2013-12-30 NOTE — Progress Notes (Signed)
[address], [name], [place]  [address] Phone: [phone] Fax:  [phone]  Date:  12/30/2013   ID:  [name], DOB 03-04-1941, MRN [id number]  PCP:  Lorna Few, DO  Cardiologist:  Fransico Him, MD     History of Present Illness: [name] is a 73 y.o. female with a history of HTN  presents today for followup. She was in [facility] in July with complaints of atypical CP and ruled out for MI. Nuclear stress test showed intermediate risk with normal perfusion but marked ST changes and patient underwent cath showing normal coronary arteries and mild to moderate elevated in LVEDP.  She is doing well today. She denies any SOB, DOE, dizziness, palpitations or syncope. She has chronic LE edema that is stable. She has had a few bouts of CP since the heart cath which resolved with SL NTG.  She says that the CP usually occurs when she is in bed.  The episode prompting admission in July occurred after she ate a big meal and then went to bed and it awakened her out of a sound sleep.    Wt Readings from Last 3 Encounters:  12/30/13 149 lb 12.8 oz (67.949 kg)  11/10/13 145 lb 11.6 oz (66.1 kg)  11/10/13 145 lb 11.6 oz (66.1 kg)     Past Medical History  Diagnosis Date  . Depression   . Gout   . Bipolar 1 disorder   . Schizoaffective disorder   . Chronic kidney disease   . Hypertension   . Seizure disorder   . Thrombocytopenia   . Anemia   . Normal coronary arteries     Current Outpatient Prescriptions  Medication Sig Dispense Refill  . Calcium Citrate-Vitamin D (CALCIUM CITRATE + D3 PO) Take 1 tablet by mouth 2 (two) times daily.      [name] diltiazem (CARDIZEM) 60 MG tablet TAKE 1 TABLET BY MOUTH THREE TIMES DAILY  90 tablet  3  . divalproex (DEPAKOTE ER) 500 MG 24 hr tablet Take 1 tablet (500 mg total) by mouth daily.  30 tablet  0  . Menthol, Topical Analgesic, (ICY HOT) 7.5 % (ROLL) MISC Apply 1 each topically as needed (for pain).      . Multiple Vitamins-Minerals  (CENTRUM SILVER ADULT 50+ PO) Take 1 tablet by mouth daily.      . nitroGLYCERIN (NITROSTAT) 0.4 MG SL tablet Place 1 tablet (0.4 mg total) under the tongue every 5 (five) minutes as needed for chest pain.  10 tablet  0  . polyethylene glycol (MIRALAX / GLYCOLAX) packet Take 17 g by mouth daily as needed for mild constipation.  14 each  6  . polyvinyl alcohol (LIQUIFILM TEARS) 1.4 % ophthalmic solution Place 1 drop into both eyes daily as needed for dry eyes. For dry eyes      . risperiDONE (RISPERDAL) 2 MG tablet Take 2 mg by mouth at bedtime.      . vitamin C (ASCORBIC ACID) 500 MG tablet Take 500 mg by mouth daily.       No current facility-administered medications for this visit.    Allergies:    Allergies  Allergen Reactions  . Ativan [Lorazepam] Other (See Comments)    Increases fall risk and was told not to take it.  . Benztropine Mesylate Other (See Comments)    Restless legs  . Clonazepam Other (See Comments)    Increases fall risk and was told not to take it.  [name]  Vicodin [Hydrocodone-Acetaminophen] Other (See Comments)    Increases fall risk and was told not to take it.  . Lisinopril Other (See Comments)    cough  . Sulfonamide Derivatives Other (See Comments)    Pt does not remember an allergic reaction to sulfa drugs    Social History:  The patient  reports that she has never smoked. She has never used smokeless tobacco. She reports that she does not drink alcohol or use illicit drugs.   Family History:  The patient's family history includes Heart attack in her father and mother; Heart disease in her father and mother; Hypertension in her brother.   ROS:  Please see the history of present illness.      All other systems reviewed and negative.   PHYSICAL EXAM: VS:  BP 130/62  Pulse 80  Ht 5\' 3"  (1.6 m)  Wt 149 lb 12.8 oz (67.949 kg)  BMI 26.54 kg/m2  SpO2 99% Well nourished, well developed, in no acute distress HEENT: normal Neck: no JVD Cardiac:  normal S1, S2;  RRR; no murmur Lungs:  clear to auscultation bilaterally, no wheezing, rhonchi or rales Abd: soft, nontender, no hepatomegaly Ext: no edema Skin: warm and dry Neuro:  CNs 2-12 intact, no focal abnormalities noted  ASSESSMENT AND PLAN:  1. HTN - well controlled - continue diltiazem        2.  Atypical CP that occurs mainly at night.  It is relieved with NGT but cath was normal and I suspect she is having GERD with esophageal spasm. - start Protonix 40mg  daily  Followup with me in 1 year     Signed, Fransico Him, MD 12/30/2013 9:46 AM

## 2013-12-30 NOTE — Patient Instructions (Signed)
Your physician has recommended you make the following change in your medication: 1. Start Protonix 40 MG 1 tablet daily  Your physician wants you to follow-up in: 1 year with Dr Mallie Snooks will receive a reminder letter in the mail two months in advance. If you don't receive a letter, please call our office to schedule the follow-up appointment.

## 2013-12-31 ENCOUNTER — Telehealth: Payer: Self-pay | Admitting: Cardiology

## 2013-12-31 MED ORDER — ESOMEPRAZOLE MAGNESIUM 20 MG PO PACK: 20.0000 mg | PACK | Freq: Every day | ORAL | Status: DC

## 2013-12-31 NOTE — Telephone Encounter (Signed)
TO Dr Radford Pax to advise.

## 2013-12-31 NOTE — Telephone Encounter (Signed)
She take OTC Nexium 20mg  daily

## 2013-12-31 NOTE — Telephone Encounter (Signed)
Pt is aware and new medication put on pts med list and took off the old medication.

## 2013-12-31 NOTE — Telephone Encounter (Addendum)
New message     Pt is having trouble getting her protonix refilled.  Ins will not pay----is there something else she can get?

## 2014-01-03 ENCOUNTER — Ambulatory Visit (INDEPENDENT_AMBULATORY_CARE_PROVIDER_SITE_OTHER): Payer: Medicare Other | Admitting: Family Medicine

## 2014-01-03 ENCOUNTER — Encounter: Payer: Self-pay | Admitting: Family Medicine

## 2014-01-03 VITALS — BP 139/58 | HR 72 | Temp 98.1°F | Resp 18 | Wt 150.0 lb

## 2014-01-03 DIAGNOSIS — R0789 Other chest pain: Secondary | ICD-10-CM | POA: Diagnosis not present

## 2014-01-03 MED ORDER — ASPIRIN EC 81 MG PO TBEC: 81.0000 mg | DELAYED_RELEASE_TABLET | Freq: Every day | ORAL | Status: DC

## 2014-01-03 NOTE — Patient Instructions (Signed)
Thank you so much for coming to see me today! I am glad you are doing so well today!  Please let us know if you experience any episodes of chest pain that lasts longer or feels different than usual or does not resolve with your Nitroglycerin. I sent in a prescription for Aspirin 81mg  for you to pick up!  This will help prevent clots that can cause heart attacks and strokes! Please let me know if you need anything else.  Thanks again, Dr. Gerlean Ren [facility]

## 2014-01-03 NOTE — Progress Notes (Signed)
Subjective:     Patient ID: [name], female   DOB: Jun 11, 1940, 73 y.o.   MRN: [id number]  HPI [name] is a 73yo female with history of HTN, GERD, CKD stage III, Hypercholesterolemia, and Bipolar disorder presenting today for hospital follow up.  She was hospitalized from 7/26-7/29 for atypical chest pain with negative troponins. Myoview showed ST changes with infusion.  Catheterization was normal. It was recommended at discharge to initiate Aspirin 81mg . She followed up with Cardiology on 9/17 where it was determined her pain was most likely secondary to GERD.  She was prescribed Protonix, but due to insurance problems she was switched to Nexium OTC 20mg .   She states that she has not experienced chest pain for two weeks.  She was showering and anxious about getting ready in time when the chest pain occurred. Pain was located in epigastric region and did not radiate.  She states she has not tried the Nexium yet because of the lack of pain for two weeks. No further complaints at this time.    Review of Systems  Constitutional: Negative for diaphoresis.  Respiratory: Negative for shortness of breath.   Cardiovascular: Positive for chest pain. Negative for leg swelling.  Genitourinary: Negative for difficulty urinating.  Musculoskeletal: Negative for back pain.       Objective:   Physical Exam  Constitutional: She is oriented to person, place, and time. She appears well-developed and well-nourished. No distress.  HENT:  Head: Normocephalic and atraumatic.  Mouth/Throat: Oropharynx is clear and moist. No oropharyngeal exudate.  Cardiovascular: Normal rate and regular rhythm.  Exam reveals no gallop and no friction rub.   Murmur heard. Pulmonary/Chest: Effort normal and breath sounds normal. No respiratory distress. She has no wheezes. She has no rales. She exhibits no tenderness.  Abdominal: Soft. Bowel sounds are normal. She exhibits no distension and no mass. There is no  tenderness.  Musculoskeletal: She exhibits no edema.  Neurological: She is alert and oriented to person, place, and time.  Skin: Skin is warm and dry. She is not diaphoretic.  Psychiatric: She has a normal mood and affect. Her behavior is normal.       Assessment:     Please see Problem List for Assessment.     Plan:     Please see Problem List for Plan.

## 2014-01-03 NOTE — Assessment & Plan Note (Signed)
-   Hospital Follow Up today for Atypical Chest Pain (hospitalized 7/26-7/29)  -Catheterization normal -Cardiology diagnosed with GERD. Started on Nexium OTC 20mg  -Aspirin 81mg  initiated today -Instructed to call or go to ED for changes in chest pain or if pain is not relieved by Nitroglycerin.

## 2014-01-05 ENCOUNTER — Encounter: Payer: Self-pay | Admitting: *Deleted

## 2014-01-05 ENCOUNTER — Telehealth: Payer: Self-pay | Admitting: Family Medicine

## 2014-01-05 NOTE — Telephone Encounter (Signed)
Faxed as requested. Fleeger, Jessica Dawn  

## 2014-01-05 NOTE — Telephone Encounter (Signed)
Pt called because she was seen on 9/21 and forgot to get a paper stating that she was seen her that day. She needs [place] to fax on letter head paper stating that she was seen on that day to [phone]. jw

## 2014-01-26 DIAGNOSIS — F319 Bipolar disorder, unspecified: Secondary | ICD-10-CM | POA: Diagnosis not present

## 2014-02-07 DIAGNOSIS — F319 Bipolar disorder, unspecified: Secondary | ICD-10-CM | POA: Diagnosis not present

## 2014-02-10 DIAGNOSIS — M79674 Pain in right toe(s): Secondary | ICD-10-CM | POA: Diagnosis not present

## 2014-02-10 DIAGNOSIS — B351 Tinea unguium: Secondary | ICD-10-CM | POA: Diagnosis not present

## 2014-02-10 DIAGNOSIS — M79675 Pain in left toe(s): Secondary | ICD-10-CM | POA: Diagnosis not present

## 2014-02-15 ENCOUNTER — Encounter (HOSPITAL_COMMUNITY): Payer: Self-pay | Admitting: Physical Medicine and Rehabilitation

## 2014-02-15 ENCOUNTER — Emergency Department (HOSPITAL_COMMUNITY)
Admission: EM | Admit: 2014-02-15 | Discharge: 2014-02-15 | Disposition: A | Discharge status: Home / Self Care | Payer: Medicare Other | Attending: Emergency Medicine | Admitting: Emergency Medicine

## 2014-02-15 DIAGNOSIS — Z792 Long term (current) use of antibiotics: Secondary | ICD-10-CM | POA: Insufficient documentation

## 2014-02-15 DIAGNOSIS — Z8739 Personal history of other diseases of the musculoskeletal system and connective tissue: Secondary | ICD-10-CM | POA: Insufficient documentation

## 2014-02-15 DIAGNOSIS — N189 Chronic kidney disease, unspecified: Secondary | ICD-10-CM | POA: Insufficient documentation

## 2014-02-15 DIAGNOSIS — Z8669 Personal history of other diseases of the nervous system and sense organs: Secondary | ICD-10-CM | POA: Insufficient documentation

## 2014-02-15 DIAGNOSIS — F319 Bipolar disorder, unspecified: Secondary | ICD-10-CM | POA: Diagnosis not present

## 2014-02-15 DIAGNOSIS — I10 Essential (primary) hypertension: Secondary | ICD-10-CM | POA: Diagnosis not present

## 2014-02-15 DIAGNOSIS — F259 Schizoaffective disorder, unspecified: Secondary | ICD-10-CM | POA: Diagnosis not present

## 2014-02-15 DIAGNOSIS — I129 Hypertensive chronic kidney disease with stage 1 through stage 4 chronic kidney disease, or unspecified chronic kidney disease: Secondary | ICD-10-CM | POA: Insufficient documentation

## 2014-02-15 DIAGNOSIS — Z9889 Other specified postprocedural states: Secondary | ICD-10-CM | POA: Diagnosis not present

## 2014-02-15 DIAGNOSIS — Z862 Personal history of diseases of the blood and blood-forming organs and certain disorders involving the immune mechanism: Secondary | ICD-10-CM | POA: Insufficient documentation

## 2014-02-15 DIAGNOSIS — Z79899 Other long term (current) drug therapy: Secondary | ICD-10-CM | POA: Diagnosis not present

## 2014-02-15 DIAGNOSIS — Z7982 Long term (current) use of aspirin: Secondary | ICD-10-CM | POA: Diagnosis not present

## 2014-02-15 DIAGNOSIS — N390 Urinary tract infection, site not specified: Secondary | ICD-10-CM | POA: Insufficient documentation

## 2014-02-15 DIAGNOSIS — B9689 Other specified bacterial agents as the cause of diseases classified elsewhere: Secondary | ICD-10-CM | POA: Diagnosis not present

## 2014-02-15 DIAGNOSIS — R350 Frequency of micturition: Secondary | ICD-10-CM | POA: Diagnosis present

## 2014-02-15 LAB — URINALYSIS, ROUTINE W REFLEX MICROSCOPIC
Bilirubin Urine: NEGATIVE
Glucose, UA: NEGATIVE mg/dL
KETONES UR: NEGATIVE mg/dL
NITRITE: NEGATIVE
Protein, ur: 100 mg/dL — AB
Specific Gravity, Urine: 1.004 — ABNORMAL LOW (ref 1.005–1.030)
UROBILINOGEN UA: 0.2 mg/dL (ref 0.0–1.0)
pH: 7 (ref 5.0–8.0)

## 2014-02-15 LAB — URINE MICROSCOPIC-ADD ON

## 2014-02-15 MED ORDER — CEPHALEXIN 250 MG PO CAPS
1000.0000 mg | ORAL_CAPSULE | Freq: Once | ORAL | Status: AC
  Administered 2014-02-15: 1000 mg via ORAL
  Filled 2014-02-15: qty 4

## 2014-02-15 MED ORDER — PHENAZOPYRIDINE HCL 100 MG PO TABS
200.0000 mg | ORAL_TABLET | Freq: Once | ORAL | Status: AC
  Administered 2014-02-15: 200 mg via ORAL
  Filled 2014-02-15: qty 2

## 2014-02-15 MED ORDER — CEPHALEXIN 500 MG PO CAPS: 1000.0000 mg | ORAL_CAPSULE | Freq: Two times a day (BID) | ORAL | Status: DC

## 2014-02-15 MED ORDER — PHENAZOPYRIDINE HCL 200 MG PO TABS: 200.0000 mg | ORAL_TABLET | Freq: Three times a day (TID) | ORAL | Status: DC

## 2014-02-15 NOTE — Discharge Instructions (Signed)

## 2014-02-15 NOTE — ED Provider Notes (Signed)
CSN: [id number]     Arrival date & time 02/15/14  1418 History   First MD Initiated Contact with Patient 02/15/14 1745     Chief Complaint  Patient presents with  . Urinary Frequency     (Consider location/radiation/quality/duration/timing/severity/associated sxs/prior Treatment) HPI  Patient has had several days of pain and bladder spasms with urination. She reports for a day it got better and she thought she was recovering but then today it returned more intensely. She reports she has a appointment with her family doctor but is not for another 7 days. She reports that she is having no fevers no nausea or vomiting no flank pain.  Past Medical History  Diagnosis Date  . Depression   . Gout   . Bipolar 1 disorder   . Schizoaffective disorder   . Chronic kidney disease   . Hypertension   . Seizure disorder   . Thrombocytopenia   . Anemia   . Normal coronary arteries    Past Surgical History  Procedure Laterality Date  . Tubal ligation    . Appendectomy    . Cardiac catheterization  2005    Normal  . Cardiac catheterization  11/09/2013    Normal   Family History  Problem Relation Age of Onset  . Heart attack Mother   . Heart disease Mother   . Heart attack Father   . Heart disease Father   . Hypertension Brother    History  Substance Use Topics  . Smoking status: Never Smoker   . Smokeless tobacco: Never Used  . Alcohol Use: No   OB History    No data available     Review of Systems  10 Systems reviewed and are negative for acute change except as noted in the HPI.   Allergies  Ativan; Benztropine mesylate; Clonazepam; Vicodin; Lisinopril; and Sulfonamide derivatives  Home Medications   Prior to Admission medications   Medication Sig Start Date End Date Taking? Authorizing Provider  aspirin EC 81 MG tablet Take 1 tablet (81 mg total) by mouth daily. 01/03/14  Yes [place] N Rumley, DO  Calcium Citrate-Vitamin D (CALCIUM CITRATE + D3 PO) Take 1 tablet by  mouth 2 (two) times daily.   Yes Historical Provider, MD  Carboxymethylcellulose Sodium (REFRESH TEARS OP) Place 1 drop into both eyes daily.   Yes Historical Provider, MD  diltiazem (CARDIZEM) 60 MG tablet TAKE 1 TABLET BY MOUTH THREE TIMES DAILY 12/16/13  Yes Sueanne Margarita, MD  divalproex (DEPAKOTE ER) 500 MG 24 hr tablet Take 1 tablet (500 mg total) by mouth daily. 01/18/13  Yes Hilton Sinclair, MD  esomeprazole (NEXIUM) 20 MG packet Take 20 mg by mouth daily before breakfast. 12/31/13  Yes Sueanne Margarita, MD  Menthol, Topical Analgesic, (ICY HOT) 7.5 % (ROLL) MISC Apply 1 each topically as needed (for pain).   Yes Historical Provider, MD  Multiple Vitamins-Minerals (CENTRUM SILVER ADULT 50+ PO) Take 1 tablet by mouth daily.   Yes Historical Provider, MD  nitroGLYCERIN (NITROSTAT) 0.4 MG SL tablet Place 1 tablet (0.4 mg total) under the tongue every 5 (five) minutes as needed for chest pain. 12/30/13  Yes Sueanne Margarita, MD  polyethylene glycol (MIRALAX / GLYCOLAX) packet Take 17 g by mouth daily as needed for mild constipation. 11/24/13  Yes [place] N Rumley, DO  risperiDONE (RISPERDAL) 2 MG tablet Take 2 mg by mouth at bedtime.   Yes Historical Provider, MD  vitamin C (ASCORBIC ACID) 500 MG tablet Take  500 mg by mouth daily.   Yes Historical Provider, MD  cephALEXin (KEFLEX) 500 MG capsule Take 2 capsules (1,000 mg total) by mouth 2 (two) times daily. 02/15/14   Charlesetta Shanks, MD  phenazopyridine (PYRIDIUM) 200 MG tablet Take 1 tablet (200 mg total) by mouth 3 (three) times daily. 02/15/14   Charlesetta Shanks, MD  polyvinyl alcohol (LIQUIFILM TEARS) 1.4 % ophthalmic solution Place 1 drop into both eyes daily as needed for dry eyes. For dry eyes    Historical Provider, MD   BP 159/72 mmHg  Temp(Src) 98.4 F (36.9 C) (Oral)  Resp 18  SpO2 99% Physical Exam  Constitutional: She is oriented to person, place, and time. She appears well-developed and well-nourished.  HENT:  Head: Atraumatic.   Eyes: EOM are normal.  Cardiovascular: Normal rate, regular rhythm, normal heart sounds and intact distal pulses.   Pulmonary/Chest: Effort normal and breath sounds normal. No respiratory distress. She has no wheezes. She has no rales.  Abdominal: Soft. Bowel sounds are normal. She exhibits no distension. There is no tenderness. There is no rebound and no guarding.  No CVA tenderness.  Musculoskeletal: Normal range of motion. She exhibits no edema.  Neurological: She is alert and oriented to person, place, and time. Coordination normal.  Skin: Skin is warm and dry.  Psychiatric: She has a normal mood and affect.    ED Course  Procedures (including critical care time) Labs Review Labs Reviewed  URINALYSIS, ROUTINE W REFLEX MICROSCOPIC - Abnormal; Notable for the following:    APPearance CLOUDY (*)    Specific Gravity, Urine 1.004 (*)    Hgb urine dipstick LARGE (*)    Protein, ur 100 (*)    Leukocytes, UA LARGE (*)    All other components within normal limits  URINE MICROSCOPIC-ADD ON - Abnormal; Notable for the following:    Bacteria, UA FEW (*)    All other components within normal limits  URINE CULTURE    Imaging Review No results found.   EKG Interpretation None      MDM   Final diagnoses:  UTI (lower urinary tract infection)   Patient presents with characteristic UTI symptoms. She does not have associated constitutional illness or reproducible abdominal pain or flank pain. She does report a sulfa allergy. She will be started on Keflex based on the allergy. Patient is counseled to return should she develop fever or generalized illness flank pain abdominal pain or other concerning symptoms.    Charlesetta Shanks, MD 02/15/14 1816

## 2014-02-15 NOTE — ED Notes (Signed)
Pt comfortable with discharge and follow up instructions. Prescriptions x2. 

## 2014-02-15 NOTE — ED Notes (Signed)
Pt presents to department for evaluation of dysuria and hematuria. Onset today. Denies pain. Pt is alert and oriented x4. NAD.

## 2014-02-16 ENCOUNTER — Ambulatory Visit: Payer: Self-pay | Admitting: Family Medicine

## 2014-02-16 LAB — URINE CULTURE
COLONY COUNT: NO GROWTH
CULTURE: NO GROWTH

## 2014-02-21 ENCOUNTER — Ambulatory Visit (INDEPENDENT_AMBULATORY_CARE_PROVIDER_SITE_OTHER): Payer: Medicare Other | Admitting: Family Medicine

## 2014-02-21 ENCOUNTER — Encounter: Payer: Self-pay | Admitting: Family Medicine

## 2014-02-21 VITALS — BP 137/70 | HR 74 | Temp 97.9°F | Ht 63.0 in | Wt 150.0 lb

## 2014-02-21 DIAGNOSIS — R319 Hematuria, unspecified: Secondary | ICD-10-CM

## 2014-02-21 DIAGNOSIS — N390 Urinary tract infection, site not specified: Secondary | ICD-10-CM | POA: Diagnosis not present

## 2014-02-21 NOTE — Patient Instructions (Signed)
Thank you so much for coming to visit [place] today! I'm glad you are feeling better! Please continue your course of antibiotics. Please return in 1 month and give [place] some urine so we can check it for blood!  Thanks again! Dr. Gerlean Ren

## 2014-02-22 DIAGNOSIS — N390 Urinary tract infection, site not specified: Secondary | ICD-10-CM | POA: Insufficient documentation

## 2014-02-22 NOTE — Progress Notes (Signed)
Subjective:     Patient ID: Renee Rival, female   DOB: 05/06/1940, 73 y.o.   MRN: [id number]  HPI Mrs. Akers is a 73yo female presenting today for follow-up following diagnosis of UTI at Emergency Department. - Presented to ED on 02/15/14 with dysuria and hematuria  -Discharged with Keflex 1000mg  BID - Denies pain or hematuria since initiating antibiotics - Denies fever - Has been taking Keflex appropriately with last dose on 02/22/14 - No further complaints today  Review of Systems  Constitutional: Negative for fever and chills.  Gastrointestinal: Negative for abdominal pain.  Genitourinary: Negative for dysuria, frequency, hematuria, flank pain and difficulty urinating.       Objective:   Physical Exam  Constitutional: She is oriented to person, place, and time. She appears well-developed and well-nourished. No distress.  HENT:  Head: Normocephalic and atraumatic.  Cardiovascular: Normal rate and regular rhythm.  Exam reveals no gallop and no friction rub.   Murmur heard. Pulmonary/Chest: Effort normal and breath sounds normal. No respiratory distress. She has no wheezes. She has no rales.  Abdominal: Soft. Bowel sounds are normal. She exhibits no distension. There is no tenderness.  Musculoskeletal: She exhibits no edema.  Neurological: She is alert and oriented to person, place, and time.  Psychiatric: She has a normal mood and affect. Her behavior is normal.      Assessment:     Please refer to Problem List for Assessment.    Plan:     Please refer to Problem List for Plan.

## 2014-02-22 NOTE — Assessment & Plan Note (Signed)
-   Continue current antibiotic and finish dose - Return for nursing visit in one month for urinalysis to check for blood - If blood is present, consider cystoscopy

## 2014-03-21 ENCOUNTER — Telehealth: Payer: Self-pay | Admitting: *Deleted

## 2014-03-21 NOTE — Telephone Encounter (Signed)
Pt called asking if her PCP signed a form for her adult diapers.  She said it is very important that this form is completed.  She has to wear "pull-up" 24/7.  Derl Barrow, RN

## 2014-03-21 NOTE — Telephone Encounter (Signed)
Form completed and placed in "To Be Faxed" folder.

## 2014-03-22 NOTE — Telephone Encounter (Signed)
Spoke with patient and informed her of below message

## 2014-03-24 ENCOUNTER — Encounter (HOSPITAL_COMMUNITY): Payer: Self-pay | Admitting: Cardiology

## 2014-03-24 ENCOUNTER — Other Ambulatory Visit (INDEPENDENT_AMBULATORY_CARE_PROVIDER_SITE_OTHER): Payer: Medicare Other

## 2014-03-24 DIAGNOSIS — R319 Hematuria, unspecified: Secondary | ICD-10-CM

## 2014-03-24 LAB — POCT URINALYSIS DIPSTICK
Bilirubin, UA: NEGATIVE
Glucose, UA: NEGATIVE
KETONES UA: NEGATIVE
Leukocytes, UA: NEGATIVE
Nitrite, UA: NEGATIVE
PH UA: 7
PROTEIN UA: NEGATIVE
RBC UA: NEGATIVE
SPEC GRAV UA: 1.01
UROBILINOGEN UA: 0.2

## 2014-03-24 NOTE — Progress Notes (Signed)
Patient came in today for a repeat urinalysis

## 2014-03-28 DIAGNOSIS — F319 Bipolar disorder, unspecified: Secondary | ICD-10-CM | POA: Diagnosis not present

## 2014-04-22 ENCOUNTER — Telehealth: Payer: Self-pay | Admitting: Family Medicine

## 2014-04-22 NOTE — Telephone Encounter (Signed)
Pt called and would like to speak to Dr. Johnathan Hausen nurse. jw

## 2014-04-22 NOTE — Telephone Encounter (Signed)
Spoke with patient and she states that she is having hand pain only before she urinates and has been having dysuria since 1pm today and didn't want to been seen only wanted the medication she has been prescribed before from [place] for the UTI. Informed her that she may have to go tot UC

## 2014-04-22 NOTE — Telephone Encounter (Signed)
Painful urination and hands hurt when she finishes Please advise

## 2014-04-26 NOTE — Telephone Encounter (Signed)
Please have her schedule an appointment with me or one of my colleages.

## 2014-05-19 DIAGNOSIS — M79674 Pain in right toe(s): Secondary | ICD-10-CM | POA: Diagnosis not present

## 2014-05-19 DIAGNOSIS — B351 Tinea unguium: Secondary | ICD-10-CM | POA: Diagnosis not present

## 2014-05-19 DIAGNOSIS — M79675 Pain in left toe(s): Secondary | ICD-10-CM | POA: Diagnosis not present

## 2014-06-14 ENCOUNTER — Other Ambulatory Visit: Payer: Self-pay | Admitting: Cardiology

## 2014-07-26 DIAGNOSIS — F319 Bipolar disorder, unspecified: Secondary | ICD-10-CM | POA: Diagnosis not present

## 2014-08-04 ENCOUNTER — Encounter (HOSPITAL_COMMUNITY): Payer: Self-pay | Admitting: Emergency Medicine

## 2014-08-04 ENCOUNTER — Emergency Department (HOSPITAL_COMMUNITY): Payer: Medicare Other

## 2014-08-04 ENCOUNTER — Emergency Department (HOSPITAL_COMMUNITY)
Admission: EM | Admit: 2014-08-04 | Discharge: 2014-08-04 | Disposition: A | Discharge status: Home / Self Care | Payer: Medicare Other | Attending: Emergency Medicine | Admitting: Emergency Medicine

## 2014-08-04 DIAGNOSIS — G40909 Epilepsy, unspecified, not intractable, without status epilepticus: Secondary | ICD-10-CM | POA: Diagnosis not present

## 2014-08-04 DIAGNOSIS — R079 Chest pain, unspecified: Secondary | ICD-10-CM | POA: Insufficient documentation

## 2014-08-04 DIAGNOSIS — I129 Hypertensive chronic kidney disease with stage 1 through stage 4 chronic kidney disease, or unspecified chronic kidney disease: Secondary | ICD-10-CM | POA: Diagnosis not present

## 2014-08-04 DIAGNOSIS — R5383 Other fatigue: Secondary | ICD-10-CM | POA: Diagnosis present

## 2014-08-04 DIAGNOSIS — Z7982 Long term (current) use of aspirin: Secondary | ICD-10-CM | POA: Diagnosis not present

## 2014-08-04 DIAGNOSIS — Z9889 Other specified postprocedural states: Secondary | ICD-10-CM | POA: Insufficient documentation

## 2014-08-04 DIAGNOSIS — F319 Bipolar disorder, unspecified: Secondary | ICD-10-CM | POA: Insufficient documentation

## 2014-08-04 DIAGNOSIS — R404 Transient alteration of awareness: Secondary | ICD-10-CM | POA: Diagnosis not present

## 2014-08-04 DIAGNOSIS — F259 Schizoaffective disorder, unspecified: Secondary | ICD-10-CM | POA: Diagnosis not present

## 2014-08-04 DIAGNOSIS — R51 Headache: Secondary | ICD-10-CM | POA: Insufficient documentation

## 2014-08-04 DIAGNOSIS — N189 Chronic kidney disease, unspecified: Secondary | ICD-10-CM | POA: Diagnosis not present

## 2014-08-04 DIAGNOSIS — R002 Palpitations: Secondary | ICD-10-CM | POA: Diagnosis not present

## 2014-08-04 DIAGNOSIS — Z79899 Other long term (current) drug therapy: Secondary | ICD-10-CM | POA: Insufficient documentation

## 2014-08-04 DIAGNOSIS — R110 Nausea: Secondary | ICD-10-CM | POA: Diagnosis not present

## 2014-08-04 DIAGNOSIS — Z8739 Personal history of other diseases of the musculoskeletal system and connective tissue: Secondary | ICD-10-CM | POA: Diagnosis not present

## 2014-08-04 DIAGNOSIS — R0602 Shortness of breath: Secondary | ICD-10-CM | POA: Insufficient documentation

## 2014-08-04 DIAGNOSIS — Z792 Long term (current) use of antibiotics: Secondary | ICD-10-CM | POA: Diagnosis not present

## 2014-08-04 DIAGNOSIS — Z862 Personal history of diseases of the blood and blood-forming organs and certain disorders involving the immune mechanism: Secondary | ICD-10-CM | POA: Insufficient documentation

## 2014-08-04 DIAGNOSIS — R531 Weakness: Secondary | ICD-10-CM | POA: Diagnosis not present

## 2014-08-04 LAB — CBC WITH DIFFERENTIAL/PLATELET
Basophils Absolute: 0 10*3/uL (ref 0.0–0.1)
Basophils Relative: 0 % (ref 0–1)
EOS ABS: 0 10*3/uL (ref 0.0–0.7)
Eosinophils Relative: 0 % (ref 0–5)
HCT: 37.1 % (ref 36.0–46.0)
Hemoglobin: 12.1 g/dL (ref 12.0–15.0)
LYMPHS ABS: 1.4 10*3/uL (ref 0.7–4.0)
LYMPHS PCT: 35 % (ref 12–46)
MCH: 26.8 pg (ref 26.0–34.0)
MCHC: 32.6 g/dL (ref 30.0–36.0)
MCV: 82.3 fL (ref 78.0–100.0)
Monocytes Absolute: 0.3 10*3/uL (ref 0.1–1.0)
Monocytes Relative: 7 % (ref 3–12)
NEUTROS PCT: 58 % (ref 43–77)
Neutro Abs: 2.3 10*3/uL (ref 1.7–7.7)
Platelets: 161 10*3/uL (ref 150–400)
RBC: 4.51 MIL/uL (ref 3.87–5.11)
RDW: 15.8 % — ABNORMAL HIGH (ref 11.5–15.5)
WBC: 4.1 10*3/uL (ref 4.0–10.5)

## 2014-08-04 LAB — COMPREHENSIVE METABOLIC PANEL
ALT: 12 U/L (ref 0–35)
ANION GAP: 11 (ref 5–15)
AST: 18 U/L (ref 0–37)
Albumin: 3.8 g/dL (ref 3.5–5.2)
Alkaline Phosphatase: 61 U/L (ref 39–117)
BILIRUBIN TOTAL: 0.4 mg/dL (ref 0.3–1.2)
BUN: 11 mg/dL (ref 6–23)
CALCIUM: 10.4 mg/dL (ref 8.4–10.5)
CO2: 26 mmol/L (ref 19–32)
Chloride: 104 mmol/L (ref 96–112)
Creatinine, Ser: 1.06 mg/dL (ref 0.50–1.10)
GFR calc Af Amer: 58 mL/min — ABNORMAL LOW (ref >90)
GFR, EST NON AFRICAN AMERICAN: 50 mL/min — AB (ref >90)
Glucose, Bld: 94 mg/dL (ref 70–99)
Potassium: 4.2 mmol/L (ref 3.5–5.1)
Sodium: 141 mmol/L (ref 135–145)
Total Protein: 7.3 g/dL (ref 6.0–8.3)

## 2014-08-04 LAB — URINALYSIS, ROUTINE W REFLEX MICROSCOPIC
Bilirubin Urine: NEGATIVE
Glucose, UA: NEGATIVE mg/dL
Hgb urine dipstick: NEGATIVE
Ketones, ur: NEGATIVE mg/dL
LEUKOCYTES UA: NEGATIVE
NITRITE: NEGATIVE
PH: 7.5 (ref 5.0–8.0)
Protein, ur: NEGATIVE mg/dL
SPECIFIC GRAVITY, URINE: 1.006 (ref 1.005–1.030)
Urobilinogen, UA: 0.2 mg/dL (ref 0.0–1.0)

## 2014-08-04 LAB — I-STAT TROPONIN, ED
Troponin i, poc: 0 ng/mL (ref 0.00–0.08)
Troponin i, poc: 0.01 ng/mL (ref 0.00–0.08)

## 2014-08-04 MED ORDER — SODIUM CHLORIDE 0.9 % IV BOLUS (SEPSIS)
500.0000 mL | Freq: Once | INTRAVENOUS | Status: AC
  Administered 2014-08-04: 500 mL via INTRAVENOUS

## 2014-08-04 MED ORDER — ACETAMINOPHEN 325 MG PO TABS
650.0000 mg | ORAL_TABLET | Freq: Once | ORAL | Status: AC
  Administered 2014-08-04: 650 mg via ORAL
  Filled 2014-08-04: qty 2

## 2014-08-04 NOTE — ED Notes (Signed)
Pt c/o fatigue since Monday. Pt sts she had a racing heart beat this morning and took 2 nitro and 324mg  ASA and symptoms have subsided. Pt sts she has HA 8/10 after taking nitro. Pt A&O, Hx Afib and anxiety. Pt is currently NSR. Pt was in Afib with EMS arrival and en route NSR.

## 2014-08-04 NOTE — ED Provider Notes (Signed)
CSN: [id number]     Arrival date & time 08/04/14  2482 History   First MD Initiated Contact with Patient 08/04/14 [phone]     Chief Complaint  Patient presents with  . Fatigue     (Consider location/radiation/quality/duration/timing/severity/associated sxs/prior Treatment) Patient is a 74 y.o. female presenting with palpitations. The history is provided by the patient.  Palpitations Palpitations quality:  Fast Onset quality:  Sudden Timing:  Intermittent Progression:  Resolved Chronicity:  Recurrent Context comment:  At rest Relieved by:  Nothing Worsened by:  Nothing Ineffective treatments:  None tried Associated symptoms: chest pain, nausea and shortness of breath   Associated symptoms: no back pain, no cough, no dizziness and no vomiting     Past Medical History  Diagnosis Date  . Depression   . Gout   . Bipolar 1 disorder   . Schizoaffective disorder   . Chronic kidney disease   . Hypertension   . Seizure disorder   . Thrombocytopenia   . Anemia   . Normal coronary arteries    Past Surgical History  Procedure Laterality Date  . Tubal ligation    . Appendectomy    . Cardiac catheterization  2005    Normal  . Cardiac catheterization  11/09/2013    Normal  . Left heart catheterization with coronary angiogram N/A 11/09/2013    Procedure: LEFT HEART CATHETERIZATION WITH CORONARY ANGIOGRAM;  Surgeon: Leonie Man, MD;  Location: [facility] CATH LAB;  Service: Cardiovascular;  Laterality: N/A;   Family History  Problem Relation Age of Onset  . Heart attack Mother   . Heart disease Mother   . Heart attack Father   . Heart disease Father   . Hypertension Brother    History  Substance Use Topics  . Smoking status: Never Smoker   . Smokeless tobacco: Never Used  . Alcohol Use: No   OB History    No data available     Review of Systems  Constitutional: Negative for fever and fatigue.  HENT: Negative for congestion and drooling.   Eyes: Negative for pain.   Respiratory: Positive for shortness of breath. Negative for cough.   Cardiovascular: Positive for chest pain and palpitations.  Gastrointestinal: Positive for nausea. Negative for vomiting, abdominal pain and diarrhea.  Genitourinary: Negative for dysuria and hematuria.  Musculoskeletal: Negative for back pain, gait problem and neck pain.  Skin: Negative for color change.  Neurological: Positive for headaches. Negative for dizziness.  Hematological: Negative for adenopathy.  Psychiatric/Behavioral: Negative for behavioral problems.  All other systems reviewed and are negative.     Allergies  Ativan; Benztropine mesylate; Clonazepam; Vicodin; Lisinopril; and Sulfonamide derivatives  Home Medications   Prior to Admission medications   Medication Sig Start Date End Date Taking? Authorizing Provider  aspirin EC 81 MG tablet Take 1 tablet (81 mg total) by mouth daily. 01/03/14    N Rumley, DO  Calcium Citrate-Vitamin D (CALCIUM CITRATE + D3 PO) Take 1 tablet by mouth 2 (two) times daily.    Historical Provider, MD  Carboxymethylcellulose Sodium (REFRESH TEARS OP) Place 1 drop into both eyes daily.    Historical Provider, MD  cephALEXin (KEFLEX) 500 MG capsule Take 2 capsules (1,000 mg total) by mouth 2 (two) times daily. 02/15/14   Charlesetta Shanks, MD  diltiazem (CARDIZEM) 60 MG tablet TAKE 1 TABLET BY MOUTH THREE TIMES DAILY 06/14/14   Sueanne Margarita, MD  divalproex (DEPAKOTE ER) 500 MG 24 hr tablet Take 1 tablet (500 mg total)  by mouth daily. 01/18/13   Hilton Sinclair, MD  esomeprazole (NEXIUM) 20 MG packet Take 20 mg by mouth daily before breakfast. 12/31/13   Sueanne Margarita, MD  Menthol, Topical Analgesic, (ICY HOT) 7.5 % (ROLL) MISC Apply 1 each topically as needed (for pain).    Historical Provider, MD  Multiple Vitamins-Minerals (CENTRUM SILVER ADULT 50+ PO) Take 1 tablet by mouth daily.    Historical Provider, MD  nitroGLYCERIN (NITROSTAT) 0.4 MG SL tablet Place 1 tablet (0.4  mg total) under the tongue every 5 (five) minutes as needed for chest pain. 12/30/13   Sueanne Margarita, MD  phenazopyridine (PYRIDIUM) 200 MG tablet Take 1 tablet (200 mg total) by mouth 3 (three) times daily. 02/15/14   Charlesetta Shanks, MD  polyethylene glycol (MIRALAX / GLYCOLAX) packet Take 17 g by mouth daily as needed for mild constipation. 11/24/13   [place] N Rumley, DO  polyvinyl alcohol (LIQUIFILM TEARS) 1.4 % ophthalmic solution Place 1 drop into both eyes daily as needed for dry eyes. For dry eyes    Historical Provider, MD  risperiDONE (RISPERDAL) 2 MG tablet Take 2 mg by mouth at bedtime.    Historical Provider, MD  vitamin C (ASCORBIC ACID) 500 MG tablet Take 500 mg by mouth daily.    Historical Provider, MD   BP 133/61 mmHg  Pulse 72  Temp(Src) 98 F (36.7 C) (Oral)  Resp 21  Ht 5\' 3"  (1.6 m)  Wt 148 lb 12.8 oz (67.495 kg)  BMI 26.37 kg/m2  SpO2 99% Physical Exam  Constitutional: She is oriented to person, place, and time. She appears well-developed and well-nourished.  HENT:  Head: Normocephalic and atraumatic.  Mouth/Throat: Oropharynx is clear and moist. No oropharyngeal exudate.  Eyes: Conjunctivae and EOM are normal. Pupils are equal, round, and reactive to light.  Neck: Normal range of motion. Neck supple.  Cardiovascular: Normal rate, regular rhythm, normal heart sounds and intact distal pulses.  Exam reveals no gallop and no friction rub.   No murmur heard. Pulmonary/Chest: Effort normal and breath sounds normal. No respiratory distress. She has no wheezes.  Abdominal: Soft. Bowel sounds are normal. There is no tenderness. There is no rebound and no guarding.  Musculoskeletal: Normal range of motion. She exhibits no edema or tenderness.  Symmetric lower extremities without focal tenderness.  Neurological: She is alert and oriented to person, place, and time.  alert, oriented x3 speech: normal in context and clarity memory: intact grossly cranial nerves II-XII:  intact motor strength: full proximally and distally no involuntary movements or tremors sensation: intact to light touch diffusely  cerebellar: finger-to-nose and heel-to-shin intact Gait: normal with her cane  Skin: Skin is warm and dry.  Psychiatric: She has a normal mood and affect. Her behavior is normal.  Nursing note and vitals reviewed.   ED Course  Procedures (including critical care time) Labs Review Labs Reviewed  CBC WITH DIFFERENTIAL/PLATELET - Abnormal; Notable for the following:    RDW 15.8 (*)    All other components within normal limits  COMPREHENSIVE METABOLIC PANEL - Abnormal; Notable for the following:    GFR calc non Af Amer 50 (*)    GFR calc Af Amer 58 (*)    All other components within normal limits  URINALYSIS, ROUTINE W REFLEX MICROSCOPIC  I-STAT TROPOININ, ED  Randolm Idol, ED    Imaging Review Dg Chest 2 View  08/04/2014   CLINICAL DATA:  Shortness of breath.  EXAM: CHEST  2 VIEW  COMPARISON:  11/07/2013 .  FINDINGS: The heart size and mediastinal contours are within normal limits. Both lungs are clear. The visualized skeletal structures are unremarkable.  IMPRESSION: No active cardiopulmonary disease.   Electronically Signed   By: Marcello Moores  Register   On: 08/04/2014 11:12     EKG Interpretation   Date/Time:  Thursday August 04 2014 07:21:58 EDT Ventricular Rate:  79 PR Interval:  180 QRS Duration: 66 QT Interval:  358 QTC Calculation: 410 R Axis:   -7 Text Interpretation:  Sinus rhythm Ventricular premature complex  Anteroseptal infarct, old Nonspecific T abnormalities, lateral leads  Otherwise no significant change Confirmed by Siyah Mault  MD, Benett Swoyer ([id number])  on 08/04/2014 7:31:20 AM      MDM   Final diagnoses:  Chest pain  Palpitations    7:48 AM 74 y.o. female  with a history of bipolar, schizoaffective, chronic kidney disease, seizure disorder who presents with multiple complaints. She states that she has felt generalized weakness  and not well for the last 3 days. She states that she had some heart palpitations 4 days ago and again awoke with him this morning. She states they lasted about 30-45 minutes. After they resolved she developed some left-sided dull chest pain for 2-3 minutes. She got 2 nitroglycerin and aspirin in route and has had resolution of her symptoms. She now complains of a mild headache on the vertex of her head. Likely related to the nitroglycerin. Vital signs unremarkable. EKG shows normal sinus rhythm. Patient otherwise appears stable and is in no acute distress and not currently having any chest pain or palpitations. We'll get screening labs and imaging. The patient has history of initially suspicious Myoview but ultimately negative heart cath last year.   11/09/13 : Heart Cath - Normal coronary arteries with no culprit lesion to explain either the patient's chest discomfort symptoms or the abnormal Myoview. False Positive Myoview  12:52 PM: I interpreted/reviewed the labs and/or imaging which were non-contributory.  Delta trop neg. Pt has remained asx throughout her stay.  I have discussed the diagnosis/risks/treatment options with the patient and believe the pt to be eligible for discharge home to follow-up with her pcp or cardiologist in the next 2-3 days. We also discussed returning to the ED immediately if new or worsening sx occur. We discussed the sx which are most concerning (e.g., sob, fever, cp) that necessitate immediate return. Medications administered to the patient during their visit and any new prescriptions provided to the patient are listed below.  Medications given during this visit Medications  sodium chloride 0.9 % bolus 500 mL (500 mLs Intravenous New Bag/Given 08/04/14 0810)  acetaminophen (TYLENOL) tablet 650 mg (650 mg Oral Given 08/04/14 0819)    New Prescriptions   No medications on file     Pamella Pert, MD 08/04/14 1252

## 2014-08-12 DIAGNOSIS — B351 Tinea unguium: Secondary | ICD-10-CM | POA: Diagnosis not present

## 2014-08-12 DIAGNOSIS — M79675 Pain in left toe(s): Secondary | ICD-10-CM | POA: Diagnosis not present

## 2014-08-12 DIAGNOSIS — M79674 Pain in right toe(s): Secondary | ICD-10-CM | POA: Diagnosis not present

## 2014-09-01 ENCOUNTER — Telehealth: Payer: Self-pay | Admitting: Cardiology

## 2014-09-01 NOTE — Telephone Encounter (Signed)
Patient agrees to see her PCP. She st she will call tomorrow to schedule and appointment. Per patient, rescheduled Dr. Theodosia Blender OV to 6/27.  Patient understands to call if she has any concerns before then.

## 2014-09-01 NOTE — Telephone Encounter (Signed)
New Message   Pt c/o of Chest Pain: STAT if CP now or developed within 24 hours  1. Are you having CP right now? No   2. Are you experiencing any other symptoms (ex. SOB, nausea, vomiting, sweating)? Stomach been bothering her   3. How long have you been experiencing CP? For over a month   4. Is your CP continuous or coming and going? Comes and goes  5. Have you taken Nitroglycerin? Yes she did  ?  Patient has an appt 10/10/14 "@ 130pm, patient would like to be seen sooner and would like to speak to the nurse.  Please give patient a call.

## 2014-09-01 NOTE — Telephone Encounter (Signed)
Patient st she had an episode  of CP, palpitations, and ABD pain this morning lasting about 10-15 minutes.  She took a nitro and she said the heart symptoms subsided but her stomach continued to hurt. She also said she continues to having "burning belching" for most of the day. She is now asymptomatic and feels fine, but is requesting an earlier appointment to be checked. Scheduled patient 5/23. Patient understands to call the office if symptoms return.

## 2014-09-01 NOTE — Telephone Encounter (Signed)
Patient had normal coronary arteries on cath so this is most likely not cardiac.  Please have her see her PCP

## 2014-09-05 ENCOUNTER — Encounter: Payer: Self-pay | Admitting: Cardiology

## 2014-09-13 ENCOUNTER — Ambulatory Visit (INDEPENDENT_AMBULATORY_CARE_PROVIDER_SITE_OTHER): Payer: Medicare Other | Admitting: Family Medicine

## 2014-09-13 ENCOUNTER — Encounter: Payer: Self-pay | Admitting: Family Medicine

## 2014-09-13 VITALS — BP 141/58 | HR 78 | Temp 98.2°F | Ht 63.0 in | Wt 149.5 lb

## 2014-09-13 DIAGNOSIS — R0789 Other chest pain: Secondary | ICD-10-CM

## 2014-09-13 DIAGNOSIS — R002 Palpitations: Secondary | ICD-10-CM | POA: Diagnosis not present

## 2014-09-13 DIAGNOSIS — K219 Gastro-esophageal reflux disease without esophagitis: Secondary | ICD-10-CM

## 2014-09-13 DIAGNOSIS — R609 Edema, unspecified: Secondary | ICD-10-CM | POA: Diagnosis not present

## 2014-09-13 MED ORDER — PANTOPRAZOLE SODIUM 40 MG PO TBEC: 40.0000 mg | DELAYED_RELEASE_TABLET | Freq: Every day | ORAL | Status: DC

## 2014-09-13 NOTE — Patient Instructions (Signed)
Thank you so much for coming to visit me today! I have sent in a prescription for Protonix for you to pick up! I hope this will help with your pain!  It seems like your pain is due to acid reflux, however I do want to make sure it's not your heart. We will schedule you for an echocardiogram, which is an ultrasound of your heart. I will also arrange for you to pick up a heart monitor from your cardiologist for you to wear for 24hours...this should help [place] pick up any abnormal heart rates or heart beats!  Please follow up with Cardiology as scheduled! If you develop worsening chest pain that does not resolve with nitroglycerin or shortness of breath, please go to the ER for evaluation.  Let me know if there is anything else I can do for you!  Dr. Gerlean Ren

## 2014-09-14 ENCOUNTER — Telehealth: Payer: Self-pay | Admitting: Family Medicine

## 2014-09-14 ENCOUNTER — Telehealth: Payer: Self-pay | Admitting: *Deleted

## 2014-09-14 NOTE — Telephone Encounter (Signed)
[name] is calling because she needs an authorization through her insurance company to refill her pantoprazole (PROTONIX) 40 MG tablet. Please follow up with [name] and let her know that it is complete. Thank you, Fonda Kinder, ASA

## 2014-09-14 NOTE — Telephone Encounter (Signed)
Prior Authorization received from [employer] for Pantoprazole 40 mg.  PA form placed in provider box for completion. Derl Barrow, RN

## 2014-09-14 NOTE — Telephone Encounter (Signed)
Pt called about her protonix. She hasnt started taking it yet but she neeeeeeds it. She relies on delivery to get her RX and delivery days are Wed and Thurs. "I wish ever who is sitting on this would get off it and get it done"

## 2014-09-15 NOTE — Assessment & Plan Note (Signed)
-   Unable to tolerate Nexium - Protonix prescribed - Suspected to be contributing to Chest Pain - Consider Gastroenterology referral for endoscopy if no improvement

## 2014-09-15 NOTE — Progress Notes (Signed)
Subjective:     Patient ID: [name], female   DOB: 10/29/1940, 74 y.o.   MRN: [id number]  HPI [name] is a 74yo presenting for follow up of chest pain and palpitations. - Reports intermittent chest pain and palpitations. - Notes Saturday morning (5/28), severe pain, lasted 5-10 minutes, located in center of chest without radiation, resolved with nitroglycerin and 4 aspirin. Also notes episode Sunday morning but not as severe. - Pain appears to be located in epigastric region when asked to clarify - Also notes feeling of rapid heart rate during episodes - Went to ER on 4/21 with negative workup. Instructed to follow up as outpatient - Scheduled for Cardiology appointment on 10/10/14 - Episodes always occur in the morning when getting up and never during the day or with ambulation. Notes a "sour stomach" occasionally during the day - Has tried Comfort Gel, which includes simethicone and antacid, which has helped - Denies shortness of breath, orthopnea, paroxysmal nocturnal dyspnea  - Smoking history reviewed  Review of Systems  Respiratory: Negative for shortness of breath.   Cardiovascular: Positive for chest pain, palpitations and leg swelling.  Gastrointestinal: Positive for abdominal pain.  Neurological: Negative for syncope.      Objective:   Physical Exam  Constitutional: She is oriented to person, place, and time. She appears well-developed and well-nourished. No distress.  Cardiovascular: Normal rate and regular rhythm.  Exam reveals no gallop and no friction rub.   Murmur heard. Pulmonary/Chest: Effort normal. No respiratory distress. She has no wheezes. She has no rales.  Abdominal: Soft. She exhibits no distension and no mass. There is no tenderness.  Musculoskeletal: She exhibits no edema.  Neurological: She is alert and oriented to person, place, and time.  Psychiatric: She has a normal mood and affect. Her behavior is normal.       Assessment:     Please  refer to Problem List for Assessment.     Plan:     Please refer to Problem List for Plan.

## 2014-09-15 NOTE — Assessment & Plan Note (Signed)
-   Suspect chest pain is secondary to GERD. Located in epigastric region, occurs only in morning and not during the day or with exacerbation. Normal catheterization in 2015. Normal EKG.  - Given history of heart racing, palpitations, and heart murmur on exam, will place order for Holter Monitor and Echocardiogram - Already followed by Cardiology, but needs referral placed again. Referral made. Appointment scheduled 10/10/14 - Instructed to return to ED if chest pain does not resolve, shortness of breath occurs

## 2014-09-15 NOTE — Telephone Encounter (Signed)
PA for faxed to [place] for review.  Derl Barrow, RN

## 2014-09-16 ENCOUNTER — Other Ambulatory Visit: Payer: Self-pay | Admitting: Family Medicine

## 2014-09-16 ENCOUNTER — Other Ambulatory Visit (HOSPITAL_COMMUNITY): Payer: Self-pay

## 2014-09-16 MED ORDER — DEXLANSOPRAZOLE 30 MG PO CPDR: 30.0000 mg | DELAYED_RELEASE_CAPSULE | Freq: Every day | ORAL | Status: DC

## 2014-09-16 NOTE — Telephone Encounter (Signed)
Pt has called to check the status of her Pantoprazole. I explained the PA process but she is worried that she won't get this before the weekend. jw

## 2014-09-16 NOTE — Telephone Encounter (Signed)
Called [employer] to check status of PA for Pantoprazole.  Per Pharmacist patient need to have tried and fail three formulary alternatives: Nexium, Omeprazole and Dexilant.  Pt has tried and failed Nexium and Omeprazole.  Pt informed of PA status and stated she would like for provider to send in the [place].  Pt to call if she does not tolerate or if medication does not help with her symptoms.  If pt does call back regarding failure of Dexilant then a prior authorization redetermination can be started for the pantoprazole.  Please send in the [place] to patient's pharmacy.  Derl Barrow, RN

## 2014-09-16 NOTE — Progress Notes (Unsigned)
Protonix not covered by [employer] until failed course of Dexilant. Dexilant 30mg  prescribed. Has already failed treatment with Nexium and Omeprazole.

## 2014-09-19 ENCOUNTER — Ambulatory Visit (HOSPITAL_COMMUNITY)
Admission: RE | Admit: 2014-09-19 | Discharge: 2014-09-19 | Disposition: A | Discharge status: Home / Self Care | Payer: Medicare Other | Source: Ambulatory Visit | Attending: Family Medicine | Admitting: Family Medicine

## 2014-09-19 ENCOUNTER — Telehealth: Payer: Self-pay | Admitting: Family Medicine

## 2014-09-19 DIAGNOSIS — I059 Rheumatic mitral valve disease, unspecified: Secondary | ICD-10-CM | POA: Diagnosis not present

## 2014-09-19 DIAGNOSIS — R0789 Other chest pain: Secondary | ICD-10-CM

## 2014-09-19 DIAGNOSIS — R079 Chest pain, unspecified: Secondary | ICD-10-CM | POA: Diagnosis present

## 2014-09-19 DIAGNOSIS — R002 Palpitations: Secondary | ICD-10-CM

## 2014-09-19 NOTE — Telephone Encounter (Signed)
Pt called because the medication that was called in on Friday is making her sick. She would like something else but wants to speak to Dr. Gerlean Ren. Jw

## 2014-09-19 NOTE — Progress Notes (Signed)
  Echocardiogram 2D Echocardiogram has been performed.  [name] 09/19/2014, 12:25 PM

## 2014-09-20 ENCOUNTER — Other Ambulatory Visit: Payer: Self-pay | Admitting: Family Medicine

## 2014-09-20 MED ORDER — PANTOPRAZOLE SODIUM 40 MG PO TBEC: 40.0000 mg | DELAYED_RELEASE_TABLET | Freq: Every day | ORAL | Status: DC

## 2014-09-20 NOTE — Addendum Note (Signed)
Addended by: Lorna Few on: 09/20/2014 02:07 PM   Modules accepted: Orders

## 2014-09-20 NOTE — Progress Notes (Signed)
States unable to tolerate Nexium, Omeprazole, or Dexilant. Dexilant caused depression and seizure-like activity, upset stomach. Will prescribe Protonix. Discussed Echocardiogram results, to follow with Cardiology. States no more episodes of chest pain since office visit.  Cardiology appointment scheduled 10/10/14.

## 2014-09-21 NOTE — Telephone Encounter (Signed)
Prior Authorization received from [place] for Pantoprazole. Received PA approval for Pantoprazole 40 mg tablets via [employer].  Med approved for 06/23/14 - 09/21/15.  [facility] pharmacy informed. Pt also notified.   Derl Barrow, RN  Derl Barrow, RN

## 2014-10-10 ENCOUNTER — Ambulatory Visit (INDEPENDENT_AMBULATORY_CARE_PROVIDER_SITE_OTHER): Payer: Medicare Other | Admitting: Cardiology

## 2014-10-10 ENCOUNTER — Encounter: Payer: Self-pay | Admitting: Cardiology

## 2014-10-10 VITALS — BP 140/58 | HR 78 | Ht 63.0 in | Wt 151.2 lb

## 2014-10-10 DIAGNOSIS — I10 Essential (primary) hypertension: Secondary | ICD-10-CM | POA: Diagnosis not present

## 2014-10-10 DIAGNOSIS — Z0389 Encounter for observation for other suspected diseases and conditions ruled out: Secondary | ICD-10-CM | POA: Diagnosis not present

## 2014-10-10 DIAGNOSIS — I5032 Chronic diastolic (congestive) heart failure: Secondary | ICD-10-CM

## 2014-10-10 DIAGNOSIS — R0789 Other chest pain: Secondary | ICD-10-CM | POA: Diagnosis not present

## 2014-10-10 DIAGNOSIS — R002 Palpitations: Secondary | ICD-10-CM

## 2014-10-10 DIAGNOSIS — IMO0001 Reserved for inherently not codable concepts without codable children: Secondary | ICD-10-CM

## 2014-10-10 HISTORY — DX: Chronic diastolic (congestive) heart failure: I50.32

## 2014-10-10 LAB — BASIC METABOLIC PANEL
BUN: 20 mg/dL (ref 6–23)
CALCIUM: 10.3 mg/dL (ref 8.4–10.5)
CO2: 29 meq/L (ref 19–32)
Chloride: 104 mEq/L (ref 96–112)
Creatinine, Ser: 1.18 mg/dL (ref 0.40–1.20)
GFR: 57.45 mL/min — AB (ref >60.00)
GLUCOSE: 92 mg/dL (ref 70–99)
Potassium: 4 mEq/L (ref 3.5–5.1)
SODIUM: 140 meq/L (ref 135–145)

## 2014-10-10 LAB — BRAIN NATRIURETIC PEPTIDE: Pro B Natriuretic peptide (BNP): 35 pg/mL (ref 0.0–100.0)

## 2014-10-10 NOTE — Progress Notes (Signed)
Cardiology Office Note   Date:  10/10/2014   ID:  [name], DOB 1941-01-12, MRN [id number]  PCP:  Junie Panning, DO    Chief Complaint  Patient presents with  . Follow-up    chest pain, atypical      History of Present Illness: [name] is a 74 y.o. female with a history of HTN, normal coronary arteries by cath 2015 and chronic diastolic CHF. She is doing well today. She denies any SOB, DOE, dizziness, or syncope. She has chronic LE edema that is stable.  She continues to complain of chest pain that occurs in the am.  She has been taking SL NTG which helps.   She complains of palpitations mainly when she gets up fast from getting out of the bed and getting to the bedside commode.  It only lasts 10 minutes and resolves.     Past Medical History  Diagnosis Date  . Depression   . Gout   . Bipolar 1 disorder   . Schizoaffective disorder   . Chronic kidney disease   . Hypertension   . Seizure disorder   . Thrombocytopenia   . Anemia   . Normal coronary arteries   . Chronic diastolic CHF (congestive heart failure) 10/10/2014    LVEDP elevated at time of cath    Past Surgical History  Procedure Laterality Date  . Tubal ligation    . Appendectomy    . Cardiac catheterization  2005    Normal  . Cardiac catheterization  11/09/2013    Normal  . Left heart catheterization with coronary angiogram N/A 11/09/2013    Procedure: LEFT HEART CATHETERIZATION WITH CORONARY ANGIOGRAM;  Surgeon: Leonie Man, MD;  Location: [facility] CATH LAB;  Service: Cardiovascular;  Laterality: N/A;     Current Outpatient Prescriptions  Medication Sig Dispense Refill  . alum & mag hydroxide-simeth (MAALOX/MYLANTA) 200-200-20 MG/5ML suspension Take by mouth every 6 (six) hours as needed for indigestion or heartburn.    [name] aspirin EC 81 MG tablet Take 1 tablet (81 mg total) by mouth daily. 90 tablet 4  . Calcium Citrate-Vitamin D (CALCIUM CITRATE + D3 PO) Take 1 tablet by mouth  2 (two) times daily.    [name] diltiazem (CARDIZEM) 60 MG tablet TAKE 1 TABLET BY MOUTH THREE TIMES DAILY 90 tablet 6  . divalproex (DEPAKOTE ER) 500 MG 24 hr tablet Take 1 tablet (500 mg total) by mouth daily. 30 tablet 0  . Menthol, Topical Analgesic, (ICY HOT) 7.5 % (ROLL) MISC Apply 1 each topically as needed (for pain).    . Multiple Vitamins-Minerals (CENTRUM SILVER ADULT 50+ PO) Take 1 tablet by mouth daily.    . nitroGLYCERIN (NITROSTAT) 0.4 MG SL tablet Place 1 tablet (0.4 mg total) under the tongue every 5 (five) minutes as needed for chest pain. 10 tablet 5  . pantoprazole (PROTONIX) 40 MG tablet Take 1 tablet (40 mg total) by mouth daily. 30 tablet 0  . polyethylene glycol (MIRALAX / GLYCOLAX) packet Take 17 g by mouth daily as needed for mild constipation. 14 each 6  . polyvinyl alcohol (LIQUIFILM TEARS) 1.4 % ophthalmic solution Place 1 drop into both eyes daily as needed for dry eyes. For dry eyes    . risperiDONE (RISPERDAL) 2 MG tablet Take 2 mg by mouth at bedtime.    . vitamin C (ASCORBIC ACID) 500 MG tablet Take 500  mg by mouth daily.     No current facility-administered medications for this visit.    Allergies:   Ativan; Benztropine mesylate; Clonazepam; Vicodin; Lisinopril; and Sulfonamide derivatives    Social History:  The patient  reports that she has never smoked. She has never used smokeless tobacco. She reports that she does not drink alcohol or use illicit drugs.   Family History:  The patient's family history includes Heart attack in her father and mother; Heart disease in her father and mother; Hypertension in her brother.    ROS:  Please see the history of present illness.   Otherwise, review of systems are positive for none.   All other systems are reviewed and negative.    PHYSICAL EXAM: VS:  Ht 5\' 3"  (1.6 m) , BMI There is no weight on file to calculate BMI. GEN: Well nourished, well developed, in no acute distress HEENT: normal Neck: no JVD, carotid  bruits, or masses Cardiac: RRR; no rubs, or gallops,no edema, 2/6 SM at LLSB Respiratory:  clear to auscultation bilaterally, normal work of breathing GI: soft, nontender, nondistended, + BS MS: no deformity or atrophy Skin: warm and dry, no rash Neuro:  Strength and sensation are intact Psych: euthymic mood, full affect   EKG:  EKG is not ordered today.    Recent Labs: 08/04/2014: ALT 12; BUN 11; Creatinine, Ser 1.06; Hemoglobin 12.1; Platelets 161; Potassium 4.2; Sodium 141    Lipid Panel    Component Value Date/Time   CHOL 188 11/07/2013 1219   TRIG 118 11/07/2013 1219   HDL 75 11/07/2013 1219   CHOLHDL 2.5 11/07/2013 1219   VLDL 24 11/07/2013 1219   LDLCALC 89 11/07/2013 1219   LDLDIRECT 114* 12/10/2006 2054      Wt Readings from Last 3 Encounters:  09/13/14 149 lb 8 oz (67.813 kg)  08/04/14 148 lb 12.8 oz (67.495 kg)  02/21/14 150 lb (68.04 kg)    ASSESSMENT AND PLAN:  1. HTN - well controlled - continue diltiazem   2. Chronic atypical CP that occurs mainly at night but also occurs in the am when she first gets up. It is relieved with NGT but cath was normal and I suspect she is having GERD with esophageal spasm. - continue Protonix 40mg  daily - refer to GI for further evaluation       3.  Chronic diastolic CHF with elevated LVEDP at time of cath - she appears euvolemic on exam.  I will get a BNP to assess for volume overload.        4.  Palpitations that most likely are related to increase in heart rate when she exerts herself.  I will get a 30 day event monitor to make sure she is not having PAF.      Current medicines are reviewed at length with the patient today.  The patient does not have concerns regarding medicines.  The following changes have been made:  no change  Labs/ tests ordered today: See above Assessment and Plan No orders of the defined types were placed in this encounter.     Disposition:   FU with me in 1  year  Signed, Sueanne Margarita, MD  10/10/2014 1:15 PM    [facility] [address], [place], [place]  [address] Phone: [phone]; Fax: [phone]

## 2014-10-10 NOTE — Patient Instructions (Addendum)
Medication Instructions:  Your physician recommends that you continue on your current medications as directed. Please refer to the Current Medication list given to you today.   Labwork: TODAY: BMET, BNP  Testing/Procedures: Your physician has recommended that you wear an event monitor. Event monitors are medical devices that record the heart's electrical activity. Doctors most often [place] these monitors to diagnose arrhythmias. Arrhythmias are problems with the speed or rhythm of the heartbeat. The monitor is a small, portable device. You can wear one while you do your normal daily activities. This is usually used to diagnose what is causing palpitations/syncope (passing out).  Follow-Up: Your physician wants you to follow-up in: 1 year with Dr. Radford Pax. You will receive a reminder letter in the mail two months in advance. If you don't receive a letter, please call our office to schedule the follow-up appointment.   Any Other Special Instructions Will Be Listed Below (If Applicable).

## 2014-10-11 ENCOUNTER — Ambulatory Visit (INDEPENDENT_AMBULATORY_CARE_PROVIDER_SITE_OTHER): Payer: Medicare Other

## 2014-10-11 DIAGNOSIS — R002 Palpitations: Secondary | ICD-10-CM

## 2014-10-19 ENCOUNTER — Encounter: Payer: Self-pay | Admitting: Cardiology

## 2014-10-19 ENCOUNTER — Ambulatory Visit (INDEPENDENT_AMBULATORY_CARE_PROVIDER_SITE_OTHER): Payer: Medicare Other | Admitting: Podiatry

## 2014-10-19 DIAGNOSIS — M79675 Pain in left toe(s): Secondary | ICD-10-CM

## 2014-10-19 DIAGNOSIS — M79676 Pain in unspecified toe(s): Secondary | ICD-10-CM | POA: Diagnosis not present

## 2014-10-19 DIAGNOSIS — M79674 Pain in right toe(s): Secondary | ICD-10-CM

## 2014-10-19 DIAGNOSIS — B351 Tinea unguium: Secondary | ICD-10-CM | POA: Diagnosis not present

## 2014-10-19 NOTE — Progress Notes (Signed)
Patient ID: [name], female   DOB: 04-14-1941, 74 y.o.   MRN: [id number] I need my nails and corn trimmed. Complaint:  Visit Type: Patient returns to my office for continued preventative foot care services. Complaint: Patient states" my nails have grown long and thick and become painful to walk and wear shoes  . She presents for preventative foot care services. No changes to ROS  Podiatric Exam: Vascular: dorsalis pedis and posterior tibial pulses are palpable bilateral. Capillary return is immediate. Temperature gradient is WNL. Skin turgor WNL  Sensorium: Normal Semmes Weinstein monofilament test. Normal tactile sensation bilaterally. Nail Exam: Pt has thick disfigured discolored nails with subungual debris noted bilateral entire nail hallux through fifth toenails Ulcer Exam: There is no evidence of ulcer or pre-ulcerative changes or infection. Orthopedic Exam: Muscle tone and strength are WNL. No limitations in general ROM. No crepitus or effusions noted. Foot type and digits show no abnormalities. Bony prominences are unremarkable.  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 Skin: No Porokeratosis. No infection or ulcers.  Distal clavi second toe left foot.  Diagnosis:  Tinea unguium, Pain in right toe, pain in left toes  Treatment & Plan Procedures and Treatment: Consent by patient was obtained for treatment procedures. The patient understood the discussion of treatment and procedures well. All questions were answered thoroughly reviewed. Debridement of mycotic and hypertrophic toenails, 1 through 5 bilateral and clearing of subungual debris. No ulceration, no infection noted.  Return Visit-Office Procedure: Patient instructed to return to the office for a follow up visit 3 months for continued evaluation and treatment.

## 2014-10-24 ENCOUNTER — Telehealth: Payer: Self-pay | Admitting: Cardiology

## 2014-10-24 NOTE — Telephone Encounter (Signed)
Notified patient of information below regarding the need to continue the 28 day monitor. She verbalized understanding and agreement with plan. She stated that the monitor company was mailing her a new monitor she should receive by tomorrow. Patient advised to call office back should she need any further assistance or have any more questions/concerns.

## 2014-10-24 NOTE — Telephone Encounter (Signed)
New Message  Pt req a call back to determine if she would have to wear the monitor after her 28 days are up. Please call back to clarify Monitor instructions as well

## 2014-10-24 NOTE — Telephone Encounter (Signed)
Patient states she was placed on 28 day monitor on June 28th, however it stopped working a couple of days ago. She called the device company and they are wondering if she needs to have a new one sent out or if there is enough information for Dr. Radford Pax so that she does not have to complete the 28 day monitor timeframe. Contacted [facility] room. Advised by Tonye Pearson that patient needs to have monitor replaced, as there is not enough information in only one week and the monitor is for 28 days. Also the charge for the monitor is the same whether she completes the 28 days or ends it early.

## 2014-10-24 NOTE — Telephone Encounter (Signed)
Left message to call back  

## 2014-11-01 ENCOUNTER — Telehealth: Payer: Self-pay | Admitting: Family Medicine

## 2014-11-01 DIAGNOSIS — L601 Onycholysis: Secondary | ICD-10-CM

## 2014-11-01 NOTE — Telephone Encounter (Signed)
Pt calling requesting we make an appt for her to see her podiatrist, Dr. Marlena Clipper, she is due to be seen in October but they told her we have to make the appt, we can call [phone] to make this appt

## 2014-11-01 NOTE — Telephone Encounter (Signed)
Has history of following with Podiatry in past. Will place referral today for continuity.  Dr. Gerlean Ren

## 2014-11-14 ENCOUNTER — Encounter: Payer: Self-pay | Admitting: Cardiology

## 2014-11-14 NOTE — Telephone Encounter (Signed)
Pt states she is returning a call to [place] call [phone]

## 2014-11-14 NOTE — Telephone Encounter (Signed)
This encounter was created in error - please disregard.

## 2014-12-14 DIAGNOSIS — F319 Bipolar disorder, unspecified: Secondary | ICD-10-CM | POA: Diagnosis not present

## 2014-12-20 ENCOUNTER — Other Ambulatory Visit: Payer: Self-pay | Admitting: *Deleted

## 2014-12-20 MED ORDER — DILTIAZEM HCL 60 MG PO TABS: 60.0000 mg | ORAL_TABLET | Freq: Three times a day (TID) | ORAL | Status: DC

## 2014-12-21 ENCOUNTER — Telehealth: Payer: Self-pay

## 2014-12-21 NOTE — Telephone Encounter (Signed)
Pt called in saying her cardizem was not sent into pharmacy i told her it was sent in yesterday and I would call the pharmacy. Spoke to pharmacist Quillian Quince [facility]) and he explained that pt was only given a 30 day supply on 12/05/2014 because she had requested refill before it was due and that the new refill sent in 12/20/2014 can not be filled because insurance will not cover it. I called pt and let her know and she stated she didn't know she picked up a 30 day supply and she checked and found it and stated her understanding that she doesn't need a refill right now.

## 2014-12-30 ENCOUNTER — Ambulatory Visit (INDEPENDENT_AMBULATORY_CARE_PROVIDER_SITE_OTHER): Payer: Medicare Other | Admitting: Podiatry

## 2014-12-30 ENCOUNTER — Encounter: Payer: Self-pay | Admitting: Podiatry

## 2014-12-30 DIAGNOSIS — M79676 Pain in unspecified toe(s): Secondary | ICD-10-CM

## 2014-12-30 DIAGNOSIS — B351 Tinea unguium: Secondary | ICD-10-CM

## 2014-12-30 DIAGNOSIS — M79674 Pain in right toe(s): Secondary | ICD-10-CM | POA: Diagnosis not present

## 2014-12-30 DIAGNOSIS — M79675 Pain in left toe(s): Secondary | ICD-10-CM | POA: Diagnosis not present

## 2014-12-30 NOTE — Progress Notes (Signed)
Patient ID: [name], female   DOB: Sep 21, 1940, 74 y.o.   MRN: [id number] I need my nails and corn trimmed. Complaint:  Visit Type: Patient returns to my office for continued preventative foot care services. Complaint: Patient states" my nails have grown long and thick and become painful to walk and wear shoes  . She presents for preventative foot care services. No changes to ROS  Podiatric Exam: Vascular: dorsalis pedis and posterior tibial pulses are palpable bilateral. Capillary return is immediate. Temperature gradient is WNL. Skin turgor WNL  Sensorium: Normal Semmes Weinstein monofilament test. Normal tactile sensation bilaterally. Nail Exam: Pt has thick disfigured discolored nails with subungual debris noted bilateral entire nail hallux through fifth toenails Ulcer Exam: There is no evidence of ulcer or pre-ulcerative changes or infection. Orthopedic Exam: Muscle tone and strength are WNL. No limitations in general ROM. No crepitus or effusions noted. Foot type and digits show no abnormalities. Bony prominences are unremarkable.  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 Skin: No Porokeratosis. No infection or ulcers.  Distal clavi second toe left foot.  Diagnosis:  Tinea unguium, Pain in right toe, pain in left toes  Treatment & Plan Procedures and Treatment: Consent by patient was obtained for treatment procedures. The patient understood the discussion of treatment and procedures well. All questions were answered thoroughly reviewed. Debridement of mycotic and hypertrophic toenails, 1 through 5 bilateral and clearing of subungual debris. No ulceration, no infection noted.  Return Visit-Office Procedure: Patient instructed to return to the office for a follow up visit 3 months for continued evaluation and treatment.

## 2015-01-18 ENCOUNTER — Other Ambulatory Visit: Payer: Self-pay | Admitting: Cardiology

## 2015-01-26 ENCOUNTER — Other Ambulatory Visit: Payer: Self-pay | Admitting: *Deleted

## 2015-01-26 MED ORDER — POLYETHYLENE GLYCOL 3350 17 G PO PACK: 17.0000 g | PACK | Freq: Every day | ORAL | Status: DC | PRN

## 2015-02-03 ENCOUNTER — Ambulatory Visit (HOSPITAL_COMMUNITY)
Admission: RE | Admit: 2015-02-03 | Discharge: 2015-02-03 | Disposition: A | Discharge status: Home / Self Care | Payer: Medicare Other | Source: Ambulatory Visit | Attending: Family Medicine | Admitting: Family Medicine

## 2015-02-03 ENCOUNTER — Inpatient Hospital Stay (HOSPITAL_COMMUNITY)
Admission: AD | Admit: 2015-02-03 | Discharge: 2015-02-04 | DRG: 312 | Disposition: A | Discharge status: Home / Self Care | Payer: Medicare Other | Source: Ambulatory Visit | Attending: Family Medicine | Admitting: Family Medicine

## 2015-02-03 ENCOUNTER — Ambulatory Visit (INDEPENDENT_AMBULATORY_CARE_PROVIDER_SITE_OTHER): Payer: Medicare Other | Admitting: Family Medicine

## 2015-02-03 ENCOUNTER — Encounter (HOSPITAL_COMMUNITY): Payer: Self-pay | Admitting: *Deleted

## 2015-02-03 VITALS — BP 153/75 | HR 95 | Temp 98.3°F | Wt 143.0 lb

## 2015-02-03 DIAGNOSIS — R55 Syncope and collapse: Secondary | ICD-10-CM

## 2015-02-03 DIAGNOSIS — T463X5A Adverse effect of coronary vasodilators, initial encounter: Secondary | ICD-10-CM | POA: Diagnosis present

## 2015-02-03 DIAGNOSIS — K224 Dyskinesia of esophagus: Secondary | ICD-10-CM | POA: Diagnosis present

## 2015-02-03 DIAGNOSIS — K219 Gastro-esophageal reflux disease without esophagitis: Secondary | ICD-10-CM | POA: Diagnosis present

## 2015-02-03 DIAGNOSIS — Y92009 Unspecified place in unspecified non-institutional (private) residence as the place of occurrence of the external cause: Secondary | ICD-10-CM | POA: Diagnosis not present

## 2015-02-03 DIAGNOSIS — G40909 Epilepsy, unspecified, not intractable, without status epilepticus: Secondary | ICD-10-CM | POA: Diagnosis present

## 2015-02-03 DIAGNOSIS — Z66 Do not resuscitate: Secondary | ICD-10-CM | POA: Diagnosis present

## 2015-02-03 DIAGNOSIS — I5032 Chronic diastolic (congestive) heart failure: Secondary | ICD-10-CM

## 2015-02-03 DIAGNOSIS — D649 Anemia, unspecified: Secondary | ICD-10-CM | POA: Diagnosis present

## 2015-02-03 DIAGNOSIS — N390 Urinary tract infection, site not specified: Secondary | ICD-10-CM | POA: Diagnosis present

## 2015-02-03 DIAGNOSIS — Z7982 Long term (current) use of aspirin: Secondary | ICD-10-CM

## 2015-02-03 DIAGNOSIS — N183 Chronic kidney disease, stage 3 unspecified: Secondary | ICD-10-CM | POA: Diagnosis present

## 2015-02-03 DIAGNOSIS — R002 Palpitations: Secondary | ICD-10-CM | POA: Diagnosis present

## 2015-02-03 DIAGNOSIS — F259 Schizoaffective disorder, unspecified: Secondary | ICD-10-CM | POA: Diagnosis present

## 2015-02-03 DIAGNOSIS — I129 Hypertensive chronic kidney disease with stage 1 through stage 4 chronic kidney disease, or unspecified chronic kidney disease: Secondary | ICD-10-CM | POA: Diagnosis present

## 2015-02-03 DIAGNOSIS — M109 Gout, unspecified: Secondary | ICD-10-CM | POA: Diagnosis present

## 2015-02-03 DIAGNOSIS — F319 Bipolar disorder, unspecified: Secondary | ICD-10-CM | POA: Diagnosis present

## 2015-02-03 DIAGNOSIS — R309 Painful micturition, unspecified: Secondary | ICD-10-CM | POA: Diagnosis not present

## 2015-02-03 DIAGNOSIS — Z888 Allergy status to other drugs, medicaments and biological substances status: Secondary | ICD-10-CM | POA: Diagnosis not present

## 2015-02-03 DIAGNOSIS — E785 Hyperlipidemia, unspecified: Secondary | ICD-10-CM | POA: Diagnosis present

## 2015-02-03 DIAGNOSIS — R569 Unspecified convulsions: Secondary | ICD-10-CM

## 2015-02-03 DIAGNOSIS — R0789 Other chest pain: Secondary | ICD-10-CM | POA: Diagnosis not present

## 2015-02-03 DIAGNOSIS — Z886 Allergy status to analgesic agent status: Secondary | ICD-10-CM

## 2015-02-03 DIAGNOSIS — Z882 Allergy status to sulfonamides status: Secondary | ICD-10-CM

## 2015-02-03 DIAGNOSIS — R300 Dysuria: Secondary | ICD-10-CM | POA: Diagnosis not present

## 2015-02-03 DIAGNOSIS — R079 Chest pain, unspecified: Secondary | ICD-10-CM | POA: Diagnosis not present

## 2015-02-03 LAB — CBC WITH DIFFERENTIAL/PLATELET
BASOS ABS: 0 10*3/uL (ref 0.0–0.1)
BASOS PCT: 0 %
Eosinophils Absolute: 0 10*3/uL (ref 0.0–0.7)
Eosinophils Relative: 0 %
HEMATOCRIT: 36.7 % (ref 36.0–46.0)
HEMOGLOBIN: 11.7 g/dL — AB (ref 12.0–15.0)
Lymphocytes Relative: 16 %
Lymphs Abs: 1.5 10*3/uL (ref 0.7–4.0)
MCH: 26.1 pg (ref 26.0–34.0)
MCHC: 31.9 g/dL (ref 30.0–36.0)
MCV: 81.9 fL (ref 78.0–100.0)
MONOS PCT: 6 %
Monocytes Absolute: 0.5 10*3/uL (ref 0.1–1.0)
NEUTROS ABS: 7.1 10*3/uL (ref 1.7–7.7)
NEUTROS PCT: 78 %
Platelets: 155 10*3/uL (ref 150–400)
RBC: 4.48 MIL/uL (ref 3.87–5.11)
RDW: 16.8 % — ABNORMAL HIGH (ref 11.5–15.5)
WBC: 9 10*3/uL (ref 4.0–10.5)

## 2015-02-03 LAB — POCT URINALYSIS DIPSTICK
Bilirubin, UA: NEGATIVE
Glucose, UA: NEGATIVE
Ketones, UA: NEGATIVE
Nitrite, UA: NEGATIVE
Protein, UA: >=300
SPEC GRAV UA: 1.02
UROBILINOGEN UA: 0.2
pH, UA: 7.5

## 2015-02-03 LAB — MAGNESIUM: MAGNESIUM: 1.9 mg/dL (ref 1.7–2.4)

## 2015-02-03 LAB — POCT UA - MICROSCOPIC ONLY: WBC, Ur, HPF, POC: >20

## 2015-02-03 LAB — COMPREHENSIVE METABOLIC PANEL
ALBUMIN: 3.8 g/dL (ref 3.5–5.0)
ALT: 13 U/L — ABNORMAL LOW (ref 14–54)
AST: 21 U/L (ref 15–41)
Alkaline Phosphatase: 62 U/L (ref 38–126)
Anion gap: 9 (ref 5–15)
BUN: 10 mg/dL (ref 6–20)
CHLORIDE: 103 mmol/L (ref 101–111)
CO2: 28 mmol/L (ref 22–32)
Calcium: 10.3 mg/dL (ref 8.9–10.3)
Creatinine, Ser: 1.35 mg/dL — ABNORMAL HIGH (ref 0.44–1.00)
GFR calc Af Amer: 43 mL/min — ABNORMAL LOW (ref >60)
GFR, EST NON AFRICAN AMERICAN: 37 mL/min — AB (ref >60)
Glucose, Bld: 98 mg/dL (ref 65–99)
POTASSIUM: 4.4 mmol/L (ref 3.5–5.1)
Sodium: 140 mmol/L (ref 135–145)
Total Bilirubin: 0.3 mg/dL (ref 0.3–1.2)
Total Protein: 6.7 g/dL (ref 6.5–8.1)

## 2015-02-03 LAB — TSH: TSH: 3.574 u[IU]/mL (ref 0.350–4.500)

## 2015-02-03 LAB — LIPID PANEL
CHOLESTEROL: 205 mg/dL — AB (ref 0–200)
HDL: 71 mg/dL (ref >40)
LDL Cholesterol: 115 mg/dL — ABNORMAL HIGH (ref 0–99)
TRIGLYCERIDES: 96 mg/dL (ref <150)
Total CHOL/HDL Ratio: 2.9 RATIO
VLDL: 19 mg/dL (ref 0–40)

## 2015-02-03 LAB — PHOSPHORUS: Phosphorus: 3.8 mg/dL (ref 2.5–4.6)

## 2015-02-03 LAB — TROPONIN I: TROPONIN I: 0.04 ng/mL — AB (ref <0.031)

## 2015-02-03 LAB — BRAIN NATRIURETIC PEPTIDE: B Natriuretic Peptide: 21.1 pg/mL (ref 0.0–100.0)

## 2015-02-03 LAB — GLUCOSE, CAPILLARY: GLUCOSE-CAPILLARY: 102 mg/dL — AB (ref 65–99)

## 2015-02-03 MED ORDER — SODIUM CHLORIDE 0.9 % IJ SOLN: 3.0000 mL | Freq: Two times a day (BID) | INTRAMUSCULAR | Status: DC

## 2015-02-03 MED ORDER — POLYETHYLENE GLYCOL 3350 17 G PO PACK: 17.0000 g | PACK | Freq: Every day | ORAL | Status: DC | PRN

## 2015-02-03 MED ORDER — NITROGLYCERIN 0.4 MG SL SUBL
0.4000 mg | SUBLINGUAL_TABLET | SUBLINGUAL | Status: DC | PRN
Start: 2015-02-03 — End: 2015-02-04

## 2015-02-03 MED ORDER — ACETAMINOPHEN 650 MG RE SUPP: 650.0000 mg | Freq: Four times a day (QID) | RECTAL | Status: DC | PRN

## 2015-02-03 MED ORDER — CEPHALEXIN 500 MG PO CAPS
500.0000 mg | ORAL_CAPSULE | Freq: Two times a day (BID) | ORAL | Status: DC
  Administered 2015-02-03 – 2015-02-04 (×2): 500 mg via ORAL
  Filled 2015-02-03 (×3): qty 1

## 2015-02-03 MED ORDER — DEXTROSE 5 % IV SOLN
1.0000 g | INTRAVENOUS | Status: DC
  Filled 2015-02-03: qty 10

## 2015-02-03 MED ORDER — CALCIUM CITRATE-VITAMIN D 250-200 MG-UNIT PO TABS: 1.0000 | ORAL_TABLET | Freq: Two times a day (BID) | ORAL | Status: DC

## 2015-02-03 MED ORDER — DILTIAZEM HCL 60 MG PO TABS
60.0000 mg | ORAL_TABLET | Freq: Three times a day (TID) | ORAL | Status: DC
  Administered 2015-02-03 – 2015-02-04 (×4): 60 mg via ORAL
  Filled 2015-02-03 (×4): qty 1

## 2015-02-03 MED ORDER — SODIUM CHLORIDE 0.9 % IJ SOLN
3.0000 mL | Freq: Two times a day (BID) | INTRAMUSCULAR | Status: DC
  Administered 2015-02-03 – 2015-02-04 (×2): 3 mL via INTRAVENOUS

## 2015-02-03 MED ORDER — CALCIUM CARBONATE-VITAMIN D 500-200 MG-UNIT PO TABS
1.0000 | ORAL_TABLET | Freq: Two times a day (BID) | ORAL | Status: DC
  Administered 2015-02-03 – 2015-02-04 (×3): 1 via ORAL
  Filled 2015-02-03 (×4): qty 1

## 2015-02-03 MED ORDER — ASPIRIN EC 81 MG PO TBEC
81.0000 mg | DELAYED_RELEASE_TABLET | Freq: Every day | ORAL | Status: DC
  Administered 2015-02-03 – 2015-02-04 (×2): 81 mg via ORAL
  Filled 2015-02-03 (×2): qty 1

## 2015-02-03 MED ORDER — SODIUM CHLORIDE 0.9 % IJ SOLN
3.0000 mL | INTRAMUSCULAR | Status: DC | PRN
Start: 2015-02-03 — End: 2015-02-04

## 2015-02-03 MED ORDER — SODIUM CHLORIDE 0.9 % IV SOLN: 250.0000 mL | INTRAVENOUS | Status: DC | PRN

## 2015-02-03 MED ORDER — ACETAMINOPHEN 325 MG PO TABS: 650.0000 mg | ORAL_TABLET | Freq: Four times a day (QID) | ORAL | Status: DC | PRN

## 2015-02-03 MED ORDER — PANTOPRAZOLE SODIUM 40 MG PO TBEC
40.0000 mg | DELAYED_RELEASE_TABLET | Freq: Every day | ORAL | Status: DC
  Administered 2015-02-03 – 2015-02-04 (×2): 40 mg via ORAL
  Filled 2015-02-03 (×2): qty 1

## 2015-02-03 MED ORDER — HEPARIN SODIUM (PORCINE) 5000 UNIT/ML IJ SOLN
5000.0000 [IU] | Freq: Three times a day (TID) | INTRAMUSCULAR | Status: DC
  Administered 2015-02-03 – 2015-02-04 (×3): 5000 [IU] via SUBCUTANEOUS
  Filled 2015-02-03 (×3): qty 1

## 2015-02-03 MED ORDER — RISPERIDONE 2 MG PO TABS
2.0000 mg | ORAL_TABLET | Freq: Every day | ORAL | Status: DC
  Administered 2015-02-03: 2 mg via ORAL
  Filled 2015-02-03 (×2): qty 1

## 2015-02-03 MED ORDER — DIVALPROEX SODIUM ER 500 MG PO TB24
500.0000 mg | ORAL_TABLET | Freq: Every day | ORAL | Status: DC
  Administered 2015-02-03 – 2015-02-04 (×2): 500 mg via ORAL
  Filled 2015-02-03 (×2): qty 1

## 2015-02-03 NOTE — Progress Notes (Signed)
Patient ID: [name], female   DOB: [birth year], 74 y.o.   MRN: [id number]   Subjective: CC:  Blood in urine, pain with urination HPI: This history was provided by the patient.  Patient has a PMH significant for chronic diastolic HF, hypercholesterolemia, atypical chest pain, HTN, CKD III, UTI, GERD and schizoaffective disorder.  Patient is a 74 y.o. female presenting to clinic today for dysuria since Monday (10/17) and hematuria which began today.  Patient denies abdominal and flank pain as well as urinary frequency and urgency.  Patient believes her urine output has declined over the past several days.  Patient denies shortness of breath and dizziness.    Patient denies fever, but states she had one episode of diaphoresis this morning while sitting on the toilet.  Patient states the diaphoresis was preceded by a 10 minute episode of chest pain for which she took 2 SL nitroglycerin tablets.  She states she had taken nitro because of the chest pain and was sitting on the toilet to urinate.  Patient states, "when I woke up, I was sweating."  Patient believes she may have had a brief episode of LOC while sitting on the toilet and states, "everything went black."  Patient states she was leaning on the wall next to the toilet and denies falling or hitting her head.  Patient states the nitro alleviated the chest pain.  Patient denies chest pain currently and states this morning's episode of chest pain is similar to episodes she has had in the past.  Patient has been evaluated by cardiology several times for chest pain and palpitations.  She states her cardiologist told her the chest pain is related to esophageal spasms from her GERD.  Patient does not recall history of syncope with previous chest pain.    Concerns today include:  1. Dysuria, hematuria 2. Chest pain 3. Syncope  Social History Reviewed: never smoker. FamHx and MedHx updated.  Please see EMR.  ROS: Per HPI  Objective: Office vital  signs reviewed. BP 153/75 mmHg  Pulse 95  Temp(Src) 98.3 F (36.8 C) (Oral)  Wt 143 lb (64.864 kg)  Physical Examination:  General: Awake, alert, well-nourished, NAD Cardio: RRR, S1S2 heard, no murmurs appreciated, No edema, cyanosis or clubbing; +2 radial and posterior tibial pulses bilaterally Pulm: CTAB, no wheezes, rhonchi or rales.  Speaking in complete sentences, no increased WOB. GI: soft, NT/ND,+BS x4, no hepatomegaly, no splenomegaly GU: No CVA tenderness Extremities: MAE Neuro: Strength and sensation grossly intact, CNII-XII grossly intact. Skin:  Warm and dry  Assessment/ Plan: 74 y.o. female with multiple chronic illnesses including atypical chest pain, hypertension, diastolic heart failure, thrombocytopenia and high cholesterol.  Patient is followed by cardiology and has been evaluated previously for chest pain.  She had a clean cath in 2014 with no diagnosis of CAD.  She had an echo with a normal EF in 09/2014.  Patient initially presented to clinic this morning for urinary symptoms and her UA is consistent with UTI. Patient denies fever and CVA tenderness, so doubt pyelo.  On exam, patient incidentally mentioned a 10 minute episode of chest pain that was alleviated by 2 SL nitroglycerin tablets.  Patient also believes she may have had a brief syncopal episode following on-set of chest pain this morning.  The syncope episode occurred after she took nitroglycerin and may be related to the use of that medication.  Patient's ECG in clinic was unremarkable except for non-specific T wave abnormalities which is not new for  this patient based on previous ECG studies.  However, given patient's risk factors, PMH and the new symptom of syncope, further observation and evaluation is suggested.  Cardiology consult also suggested.  Discussed hospital admission with patient and she is agreeable to that plan.  1. Urinary tract infection, site not specified UA indicative of UTI.  Will begin  treatment with IV antibiotics (Rocephin 1,000 mg daily) in hospital. - POCT urinalysis dipstick - POCT UA - Microscopic Only - Urine culture  2. Chest pain, unspecified chest pain type - ECG unremarkable in clinic - Will admit to inpatient telemetry unit for further evaluation and monitoring  3. Syncope, unspecified syncope type - ECG unremarkable in clinic - Will admit to inpatient telemetry unit for further evaluation and monitoring   Sherron Ales, NP [employer] [facility]

## 2015-02-03 NOTE — Discharge Summary (Signed)
[facility] Discharge Summary  Patient name: [name] Medical record number: [id number] Date of birth: [birth year] Age: 74 y.o. Gender: female Date of Admission: 02/03/2015  Date of Discharge: 02/04/2015 Admitting Physician: Alveda Reasons, MD  Primary Care Provider: Junie Panning, DO Consultants:   Indication for Hospitalization:  Defecation pre syncope, chest pain  Discharge Diagnoses/Problem List:  Patient Active Problem List   Diagnosis Date Noted  . Pre-syncope 02/04/2015  . Syncope 02/03/2015  . Chronic diastolic CHF (congestive heart failure) ([place]) 10/10/2014  . Palpitations 10/10/2014  . UTI (urinary tract infection) 02/22/2014  . Chest pain, atypical 12/30/2013  . GERD (gastroesophageal reflux disease) 12/30/2013  . Normal coronary arteries 11/09/13 11/10/2013  . Localized swelling of lower leg (foot) 10/07/2013  . Abnormality of gait 06/12/2010  . SCHIZOAFFECTIVE DISORDER 11/28/2009  . KNEE PAIN, RIGHT, CHRONIC 10/25/2009  . ANEMIA, NORMOCYTIC, CHRONIC 11/07/2008  . GOUT 05/25/2008  . CHRONIC KIDNEY DISEASE STAGE III (MODERATE) 08/27/2007  . HYPERCHOLESTEROLEMIA 06/12/2006  . BIPOLAR DISORDER 06/12/2006  . HYPERTENSION, BENIGN SYSTEMIC 06/12/2006  . Convulsions/seizures ([place]) 06/12/2006    Disposition: Home  Discharge Condition: Stable  Discharge Exam:  BP 138/55 mmHg  Pulse 73  Temp(Src) 98.1 F (36.7 C) (Oral)  Resp 17  Ht 5\' 3"  (1.6 m)  Wt 141 lb 8.6 oz (64.2 kg)  BMI 25.08 kg/m2  SpO2 100%  Gen: NAD, well-appearing  Eyes: PERRLA, sclera anicteric, no conjunctival injection Nares: clear, no erythema, swelling or congestion Oropharynx: clear, moist CV: RRR. S1 & S2 audible, 1/6 SEM over RUSB, 2+ radial and DP pulses bilaterally. Resp: normal WOB, CTAB. Abd: +BS. Soft, NDNT, no rebound or guarding. No CVA tenderness. Ext: No edema or gross deformities. Neuro: Alert and oriented, No gross focal deficits  Brief  Hospital Course:  74 y/o with presented from clinic for concern of presyncope and chest pain in setting of a UTI. PMH sig for chronic diastolic HF, HLD, atypical chest pain, HTN, CKD III, UTI, GERD, bipolar d/o, schizoaffective d/o  On admission she reported her chest pain had resolved, denied SOB. Her troponin was negative in the ED and her EKG was negative for signs of ischemia. Her troponin slightly bumped overnight during her observation, and her EKG demonstrated some slight T-wave inversions in the lateral leads that were different from before. Cardiology was consulted, and they felt that this was not indicative of a significant change for her. Her syncope was likely related to nitroglycerin which she took immediately prior to the episode. No further inpatient cardiac workup. Safe for discharge home with follow up as an outpatient.   Issues for Follow Up:  1. Chest pain / Syncope symptoms.  2. Compliance with medications, and avoidance of overdosing nitroglycerin.   Significant Procedures: None  Significant Labs and Imaging:   Recent Labs Lab 02/03/15 1330 02/04/15 0045  WBC 9.0 8.4  HGB 11.7* 11.4*  HCT 36.7 35.4*  PLT 155 156    Recent Labs Lab 02/03/15 1330 02/04/15 0045  NA 140 140  K 4.4 4.5  CL 103 104  CO2 28 26  GLUCOSE 98 110*  BUN 10 17  CREATININE 1.35* 1.39*  CALCIUM 10.3 10.1  MG 1.9  --   PHOS 3.8  --   ALKPHOS 62  --   AST 21  --   ALT 13*  --   ALBUMIN 3.8  --       Results/Tests Pending at Time of Discharge: none  Discharge Medications:  Medication List    TAKE these medications        aspirin EC 81 MG tablet  Take 1 tablet (81 mg total) by mouth daily.     CALCIUM CITRATE + D3 PO  Take 1 tablet by mouth 2 (two) times daily.     CENTRUM SILVER ADULT 50+ PO  Take 1 tablet by mouth daily.     cephALEXin 500 MG capsule  Commonly known as:  KEFLEX  Take 1 capsule (500 mg total) by mouth every 12 (twelve) hours.     diltiazem 60 MG  tablet  Commonly known as:  CARDIZEM  Take 1 tablet (60 mg total) by mouth 3 (three) times daily.     divalproex 500 MG 24 hr tablet  Commonly known as:  DEPAKOTE ER  Take 1 tablet (500 mg total) by mouth daily.     nitroGLYCERIN 0.4 MG SL tablet  Commonly known as:  NITROSTAT  PLACE 1 TABLET UNDER THE THE TONGUE AS DIRECTED EVERY 5 MINUTES AS NEEDED FOR CHEST PAIN     pantoprazole 40 MG tablet  Commonly known as:  PROTONIX  Take 1 tablet (40 mg total) by mouth daily.     polyethylene glycol packet  Commonly known as:  MIRALAX / GLYCOLAX  Take 17 g by mouth daily as needed for mild constipation.     risperiDONE 2 MG tablet  Commonly known as:  RISPERDAL  Take 4 mg by mouth at bedtime.     vitamin C 500 MG tablet  Commonly known as:  ASCORBIC ACID  Take 500 mg by mouth daily.        Discharge Instructions: Please refer to Patient Instructions section of EMR for full details.  Patient was counseled important signs and symptoms that should prompt return to medical care, changes in medications, dietary instructions, activity restrictions, and follow up appointments.   Follow-Up Appointments: Follow-up Information    Follow up with Cordelia Poche, MD On 02/10/2015.   Specialty:  Family Medicine   Why:  2:45   Contact information:   [place] [id number] [phone]       Aquilla Hacker, MD 02/04/2015, 6:21 PM PGY-2, [place]

## 2015-02-03 NOTE — Progress Notes (Signed)
02/03/2015 1:00 PM  Pt direct admit from office with c/o dysuria and an episode of chest pain associated with syncopal episode earlier this am.  Pt currently denies pain, vitals stable.  Pt states that she lives at [facility] with family support.  Pt fully alert and oriented, ambulates with one person assist.  States that she normally ambulates using a cane and a walker.  Wears dentures (upper) and glasses.  Full assessment to EPIC.  Pt placed on telemetry box 30 and verified with CCMD.  Will continue to monitor patient. [name]

## 2015-02-03 NOTE — H&P (Signed)
[facility] Admission History and Physical Service Pager: [phone]  Patient name: [name] Medical record number: [id number] Date of birth: 03-10-1941 Age: 74 y.o. Gender: female  Primary Care Provider: Junie Panning, DO Consultants: car Code Status: DNR  Chief Complaint: chest pain and presyncopal episode  Assessment and Plan: LESLEYANNE POLITTE is a 74 y.o. female presenting as direct admit with syncopal episode . PMH is significant for chronic diastolic HF, hypercholesterolemia, atypical chest pain, HTN, CKD III, UTI, GERD and schizoaffective disorder.   Defecation Presyncopal Episode: Atypical chest pain and palpitation  concerning for cardiogenic  Pre- syncope. However, there is no report of fall and pt is unsure of actual loss of consciousness.  Lack of prodromal symptoms, makes vasogaval syncope less likely.  She has history of seizure but symptoms inconsistent with seizure. Additionally less likely stroke or TIA without residual deficits. PE unlikely with no dyspnea, tachycardia and tachypnea. Echo on 09/19/2014 EF 55-60%, G2DD,  Holter monitoring in 09/2014 negative for acute events, had occasional PVCs with episodes of tachycardia to 150s, though to be benign by cardiology. Given constellation of symptoms in setting of defecation, likely presyncope due to valsalva associated with defectaion  -Observe overnigth - Tele -Consider diltiazem CD. On 60 mg of IR at home -Nitro as needed chest pain   Atypical chest pain/ACS rule out- Given strong history of esophageal spasm with negative cardiac catheterization on 11/09/13, current EKG without signs of ischemia an stable from previous on 09/13/2014 , negative troponin chest, her pain unlikely to be cardiac in nature.  However, patient has HEART score of 5 that warrants work up for ACS r/o. -EKG in AM -Cycle Trop-neg x1 -ASA 81 mg -Risk stratification labs: Lipid panel, TSH and A1c   HFpEF: no sign of fluid  overload. No shortness of breath. BNP 21. TTE in 06/16 with EF of 55-60% G2DD. Mitral valve calcification. No other anatomic abnormality. On diltiazim 60 mg three times a day. -continue home meds  Hypertension: normotensive.On diltiazim 60 mg three times a day. -Continue home dilt  Clinical UTI: dysuria, increased frequency of urination and hematuria. UA with WBC 3+. No concern for pyelo without fever, chill and CVA tenderness - keflex 500 BID - F/u Urine culture  CKD: sCr 1.35. B/l 1-1.2 -will monitor with morning Bmet.  Anemia: stable  Hgb 11.7.  MCV 82. baseline Hgb 11-12.    Hyperlipidemia: lipid panel in 7/15 [place] 188, TG 118, HDL 75 LDL 89. Doesn't seem to be on any meds. -Lipid panel  -consider statin   Schizoaffective d/o/BPD: appropriate affect. On risperidone 2 mg at bedtime  -Continue home meds  Gout: stable  Seizure d/o: stable. On Depakote ER 500 mg daily -Will continue home depakote  GERD: on Protonix 40 mg daily - continue home meds  FEN/GI: -heart healthy diet -Protonix as above  PPX: heparin 5000 units  Disposition: floor with tele to rule out ACS, Attending Dr Nori Riis  History of Present Illness:  [name] is a 74 y.o. female presenting as a direct admit from clinic for syncopal episode.  Patient presented to [facility] clinic for dysuria and increased frequency of urination since Monday 10/17. At clinic, patient was sitting on a toilet urinating when she started feeling sharp substernal chest pain. She took nitroglycerin twice without improvement. She states that things started getting dark and black. She also thinks she may have lost consciousness but denies fall. The whole incident happened in about 5-10 minutes, then  her pain resolved. Her pain was 10/10 in intensity. Pain is was non-radiating. She denies shortness of breath, nausea, vomiting, dizziness,  She felt her heart beating fast. She has history of palpitations and has been worked up by cardiology for  this. She also has history of seizure. She had similar episode early this year that has resolved with nitroglycerin. Per her chart patient has history of esophageal spasm that responds to nitro.   Patient is pain free here. She denies shortness of breath, nausea and vomiting. She used a bathroom without any problem here.   She has dysuria, increased frequency of urination and hematuria (bright blood in her urine). She denies fever, &chills.   Review Of Systems: Per HPI Otherwise 12 point review of systems was performed and was unremarkable.  Patient Active Problem List   Diagnosis Date Noted  . Syncope 02/03/2015  . Chronic diastolic CHF (congestive heart failure) ([place]) 10/10/2014  . Palpitations 10/10/2014  . UTI (urinary tract infection) 02/22/2014  . Chest pain, atypical 12/30/2013  . GERD (gastroesophageal reflux disease) 12/30/2013  . Normal coronary arteries 11/09/13 11/10/2013  . Localized swelling of lower leg (foot) 10/07/2013  . Abnormality of gait 06/12/2010  . SCHIZOAFFECTIVE DISORDER 11/28/2009  . KNEE PAIN, RIGHT, CHRONIC 10/25/2009  . ANEMIA, NORMOCYTIC, CHRONIC 11/07/2008  . GOUT 05/25/2008  . CHRONIC KIDNEY DISEASE STAGE III (MODERATE) 08/27/2007  . HYPERCHOLESTEROLEMIA 06/12/2006  . BIPOLAR DISORDER 06/12/2006  . HYPERTENSION, BENIGN SYSTEMIC 06/12/2006  . Convulsions/seizures ([place]) 06/12/2006   Past Medical History: Past Medical History  Diagnosis Date  . Depression   . Gout   . Bipolar 1 disorder ([place])   . Schizoaffective disorder   . Chronic kidney disease   . Hypertension   . Seizure disorder ([place])   . Thrombocytopenia ([place])   . Anemia   . Normal coronary arteries   . Chronic diastolic CHF (congestive heart failure) ([place]) 10/10/2014    LVEDP elevated at time of cath   Past Surgical History: Past Surgical History  Procedure Laterality Date  . Tubal ligation    . Appendectomy    . Cardiac catheterization  2005    Normal  . Cardiac catheterization   11/09/2013    Normal  . Left heart catheterization with coronary angiogram N/A 11/09/2013    Procedure: LEFT HEART CATHETERIZATION WITH CORONARY ANGIOGRAM;  Surgeon: Leonie Man, MD;  Location: [facility] CATH LAB;  Service: Cardiovascular;  Laterality: N/A;   Social History: Social History  Substance Use Topics  . Smoking status: Never Smoker   . Smokeless tobacco: Never Used  . Alcohol Use: No   Additional social history: no history of smoking, EtOH and drug use Please also refer to relevant sections of EMR.  Family History: Family History  Problem Relation Age of Onset  . Heart attack Mother   . Heart disease Mother   . Heart attack Father   . Heart disease Father   . Hypertension Brother    Allergies and Medications: Allergies  Allergen Reactions  . Ativan [Lorazepam] Other (See Comments)    Increases fall risk and was told not to take it.  . Benztropine Mesylate Other (See Comments)    Restless legs  . Clonazepam Other (See Comments)    Increases fall risk and was told not to take it.  . Vicodin [Hydrocodone-Acetaminophen] Other (See Comments)    Increases fall risk and was told not to take it.  . Lisinopril Other (See Comments)    cough  . Sulfonamide  Derivatives Other (See Comments)    Pt does not remember an allergic reaction to sulfa drugs   No current facility-administered medications on file prior to encounter.   Current Outpatient Prescriptions on File Prior to Encounter  Medication Sig Dispense Refill  . aspirin EC 81 MG tablet Take 1 tablet (81 mg total) by mouth daily. 90 tablet 4  . Calcium Citrate-Vitamin D (CALCIUM CITRATE + D3 PO) Take 1 tablet by mouth 2 (two) times daily.    [name] diltiazem (CARDIZEM) 60 MG tablet Take 1 tablet (60 mg total) by mouth 3 (three) times daily. 270 tablet 1  . divalproex (DEPAKOTE ER) 500 MG 24 hr tablet Take 1 tablet (500 mg total) by mouth daily. 30 tablet 0  . Multiple Vitamins-Minerals (CENTRUM SILVER ADULT 50+ PO) Take 1  tablet by mouth daily.    . nitroGLYCERIN (NITROSTAT) 0.4 MG SL tablet PLACE 1 TABLET UNDER THE THE TONGUE AS DIRECTED EVERY 5 MINUTES AS NEEDED FOR CHEST PAIN 25 tablet 3  . pantoprazole (PROTONIX) 40 MG tablet Take 1 tablet (40 mg total) by mouth daily. 30 tablet 0  . polyethylene glycol (MIRALAX / GLYCOLAX) packet Take 17 g by mouth daily as needed for mild constipation. 14 each 6  . risperiDONE (RISPERDAL) 2 MG tablet Take 2 mg by mouth at bedtime.    . vitamin C (ASCORBIC ACID) 500 MG tablet Take 500 mg by mouth daily.      Objective: BP 136/70 mmHg  Pulse 85  Temp(Src) 98.3 F (36.8 C) (Oral)  Resp 18  Ht 5\' 3"  (1.6 m)  Wt 141 lb 1.5 oz (64 kg)  BMI 25.00 kg/m2  SpO2 100% Exam: Gen: NAD, well-appearing  Eyes: PERRLA, sclera anicteric, no conjunctival injection Nares: clear, no erythema, swelling or congestion Oropharynx: clear, moist CV: RRR. S1 & S2 audible, 1/6 SEM over RUSB, 2+ radial and DP pulses bilaterally. Resp: normal WOB, CTAB. Abd: +BS. Soft, NDNT, no rebound or guarding. No CVA tenderness. Ext: No edema or gross deformities. Neuro: Alert and oriented, No gross focal deficits  Labs and Imaging: CBC BMET  No results for input(s): WBC, HGB, HCT, PLT in the last 168 hours. No results for input(s): NA, K, CL, CO2, BUN, CREATININE, GLUCOSE, CALCIUM in the last 168 hours.    Mercy Riding, MD 02/03/2015, 1:35 PM PGY-1 [place] Intern pager: [phone], text pages welcome  I have seen and examined the patient. I have read and agree with the above note. My changes are noted in blue.  Cynda Soule A. Lincoln Brigham MD, [place] [facility] Resident PGY-2 Pager [phone]

## 2015-02-04 ENCOUNTER — Other Ambulatory Visit: Payer: Self-pay

## 2015-02-04 DIAGNOSIS — R0789 Other chest pain: Secondary | ICD-10-CM

## 2015-02-04 DIAGNOSIS — R55 Syncope and collapse: Secondary | ICD-10-CM | POA: Diagnosis present

## 2015-02-04 LAB — CBC
HCT: 35.4 % — ABNORMAL LOW (ref 36.0–46.0)
HEMOGLOBIN: 11.4 g/dL — AB (ref 12.0–15.0)
MCH: 26.2 pg (ref 26.0–34.0)
MCHC: 32.2 g/dL (ref 30.0–36.0)
MCV: 81.4 fL (ref 78.0–100.0)
Platelets: 156 10*3/uL (ref 150–400)
RBC: 4.35 MIL/uL (ref 3.87–5.11)
RDW: 16.9 % — AB (ref 11.5–15.5)
WBC: 8.4 10*3/uL (ref 4.0–10.5)

## 2015-02-04 LAB — GLUCOSE, CAPILLARY: GLUCOSE-CAPILLARY: 87 mg/dL (ref 65–99)

## 2015-02-04 LAB — BASIC METABOLIC PANEL
ANION GAP: 10 (ref 5–15)
BUN: 17 mg/dL (ref 6–20)
CALCIUM: 10.1 mg/dL (ref 8.9–10.3)
CO2: 26 mmol/L (ref 22–32)
Chloride: 104 mmol/L (ref 101–111)
Creatinine, Ser: 1.39 mg/dL — ABNORMAL HIGH (ref 0.44–1.00)
GFR calc Af Amer: 42 mL/min — ABNORMAL LOW (ref >60)
GFR, EST NON AFRICAN AMERICAN: 36 mL/min — AB (ref >60)
GLUCOSE: 110 mg/dL — AB (ref 65–99)
Potassium: 4.5 mmol/L (ref 3.5–5.1)
SODIUM: 140 mmol/L (ref 135–145)

## 2015-02-04 LAB — TROPONIN I: Troponin I: <0.03 ng/mL (ref <0.031)

## 2015-02-04 LAB — URINE CULTURE
COLONY COUNT: NO GROWTH
Organism ID, Bacteria: NO GROWTH

## 2015-02-04 LAB — HEMOGLOBIN A1C
HEMOGLOBIN A1C: 6.2 % — AB (ref 4.8–5.6)
MEAN PLASMA GLUCOSE: 131 mg/dL

## 2015-02-04 MED ORDER — CEPHALEXIN 500 MG PO CAPS: 500.0000 mg | ORAL_CAPSULE | Freq: Two times a day (BID) | ORAL | Status: AC

## 2015-02-04 NOTE — Discharge Instructions (Signed)
You have been admitted to the hospital for monitoring due to concern that you were having a heart attack. At this point, you have not had a heart attack. You are safe to be discharged home. If you have recurrence of your symptoms, then you should return for evaluation to the ED.   Thanks for letting us take care of you!  Sincerely,  Paula Compton, MD Family Medicine - PGY 2  Chest Pain Observation It is often hard to give a specific diagnosis for the cause of chest pain. Among other possibilities your symptoms might be caused by inadequate oxygen delivery to your heart (angina). Angina that is not treated or evaluated can lead to a heart attack (myocardial infarction) or death. Blood tests, electrocardiograms, and X-rays may have been done to help determine a possible cause of your chest pain. After evaluation and observation, your health care provider has determined that it is unlikely your pain was caused by an unstable condition that requires hospitalization. However, a full evaluation of your pain may need to be completed, with additional diagnostic testing as directed. It is very important to keep your follow-up appointments. Not keeping your follow-up appointments could result in permanent heart damage, disability, or death. If there is any problem keeping your follow-up appointments, you must call your health care provider. HOME CARE INSTRUCTIONS  Due to the slight chance that your pain could be angina, it is important to follow your health care provider's treatment plan and also maintain a healthy lifestyle:  Maintain or work toward achieving a healthy weight.  Stay physically active and exercise regularly.  Decrease your salt intake.  Eat a balanced, healthy diet. Talk to a dietitian to learn about heart-healthy foods.  Increase your fiber intake by including whole grains, vegetables, fruits, and nuts in your diet.  Avoid situations that cause stress, anger, or depression.  Take  medicines as advised by your health care provider. Report any side effects to your health care provider. Do not stop medicines or adjust the dosages on your own.  Quit smoking. Do not use nicotine patches or gum until you check with your health care provider.  Keep your blood pressure, blood sugar, and cholesterol levels within normal limits.  Limit alcohol intake to no more than 1 drink per day for women who are not pregnant and 2 drinks per day for men.  Do not abuse drugs. SEEK IMMEDIATE MEDICAL CARE IF: You have severe chest pain or pressure which may include symptoms such as:  You feel pain or pressure in your arms, neck, jaw, or back.  You have severe back or abdominal pain, feel sick to your stomach (nauseous), or throw up (vomit).  You are sweating profusely.  You are having a fast or irregular heartbeat.  You feel short of breath while at rest.  You notice increasing shortness of breath during rest, sleep, or with activity.  You have chest pain that does not get better after rest or after taking your usual medicine.  You wake from sleep with chest pain.  You are unable to sleep because you cannot breathe.  You develop a frequent cough or you are coughing up blood.  You feel dizzy, faint, or experience extreme fatigue.  You develop severe weakness, dizziness, fainting, or chills. Any of these symptoms may represent a serious problem that is an emergency. Do not wait to see if the symptoms will go away. Call your local emergency services (911 in the [place] Do not drive yourself to  the hospital. MAKE SURE YOU:  Understand these instructions.  Will watch your condition.  Will get help right away if you are not doing well or get worse.   This information is not intended to replace advice given to you by your health care provider. Make sure you discuss any questions you have with your health care provider.   Document Released: 05/04/2010 Document Revised: 04/06/2013  Document Reviewed: 10/01/2012 Elsevier Interactive Patient Education 2016 [place].   Follow-up Information    Follow up with Cordelia Poche, MD On 02/10/2015.   Specialty:  Family Medicine   Why:  2:45   Contact information:   [place] [address] [phone]

## 2015-02-04 NOTE — Progress Notes (Signed)
Entered in error

## 2015-02-04 NOTE — Progress Notes (Signed)
Family Medicine Teaching Service Daily Progress Note Intern Pager: [phone]  Patient name: [name] Medical record number: [id number] Date of birth: March 10, 1941 Age: 74 y.o. Gender: female  Primary Care Provider: Junie Panning, DO Consultants: None Code Status: Full  Pt Overview and Major Events to Date:  10/21: admit for Pre-synope, chest pain  Assessment and Plan:  Defecation Presyncopal Episode: No repeat episodes, no evets on telemetry overnight - will hold on repeat echo given recent benign echo in 09/2014, and likely vasovagal nature of presyncope  Atypical chest pain/ACS rule out- troponin .03>>.04>>.03, repeat EKG this AM shows diffuse inverted T waves and T wave flattening, different from EKG yesterday evening -aspirin 81 - f./u consult with cardiology for further recs  HFpEF: Stable -continue home meds  Hypertension: normotensive. -Continue home dilt  Clinical UTI: no elevated WBC, afebrile onight - D2, Keflex, continue for 7 days - F/u Urine culture  CKD: 1.35 this AM, stable  Anemia: hgb 11.4 this AM, stable   Hyperlipidemia: -LDL here 115, cholesterol 205, else wnl, consider additoin of statin as outpateint  Schizoaffective d/o/BPD:  - cont home riperidone  Gout: stable  Seizure d/o: stable.  - continue home Depakote  GERD:  - Protonix 40  FEN/GI: -heart healthy diet -Protonix as above  PPX: heparin ppx  Disposition: Home pending clinical improvement  Subjective:  Denies chest pain, SOB. Has not had further episodes of near passing out. Denies lightheadedness, dizziness. She reports that she slept well and had a good night  Objective: Temp:  [98 F (36.7 C)-98.3 F (36.8 C)] 98 F (36.7 C) (10/22 0446) Pulse Rate:  [69-95] 69 (10/22 0446) Resp:  [16-18] 16 (10/22 0446) BP: (115-161)/(51-75) 115/51 mmHg (10/22 0446) SpO2:  [96 %-100 %] 100 % (10/22 0446) Weight:  [141 lb 1.5 oz (64 kg)-143 lb (64.864 kg)] 141 lb 8.6 oz (64.2 kg)  (10/21 2033) Physical Exam: General: NAD Cardiovascular: RRR Respiratory: normal WOB, CTAB Abdomen: soft, nontender, + BS Extremities: no LE edema  Laboratory:  Recent Labs Lab 02/03/15 1330 02/04/15 0045  WBC 9.0 8.4  HGB 11.7* 11.4*  HCT 36.7 35.4*  PLT 155 156    Recent Labs Lab 02/03/15 1330 02/04/15 0045  NA 140 140  K 4.4 4.5  CL 103 104  CO2 28 26  BUN 10 17  CREATININE 1.35* 1.39*  CALCIUM 10.3 10.1  PROT 6.7  --   BILITOT 0.3  --   ALKPHOS 62  --   ALT 13*  --   AST 21  --   GLUCOSE 98 110*      Veatrice Bourbon, MD 02/04/2015, 6:44 AM PGY-2, [place] Intern pager: [phone], text pages welcome

## 2015-02-04 NOTE — Progress Notes (Signed)
PT Cancellation Note  Patient Details Name: [name] MRN: [id number] DOB: February 20, 1941   Cancelled Treatment:    Reason Eval/Treat Not Completed: Medical issues which prohibited therapy;Other (comment) (await cardiology consult to r/o ACS)   Ladona Ridgel 02/04/2015, 10:20 AM Gerlean Ren PT Acute Rehab Services [phone] [place] [phone]

## 2015-02-04 NOTE — Consult Note (Signed)
CONSULT NOTE  Date: 02/04/2015               Patient Name:  [name] MRN: [id number]  DOB: 06/15/1940 Age / Sex: 74 y.o., female        PCP: Junie Panning Primary Cardiologist: Radford Pax            Referring Physician: Nori Riis              Reason for Consult: Syncope            History of Present Illness: Patient is a 74 y.o. female with a PMHx of  Diastolic CHF, hyperlipidemia, HTN, CKD stage 3, , who was admitted to [facility] on 02/03/2015 for evaluation of syncope after taking NTG  For sharp chest pain  .   Pt was admitted yesterday from the clinic after having sharp CP while on the toilet.  Recent hx of hematuria and pain with urination  Took 2 SL NTG , vision got dark,  Had LOC.  It sounds like she has occasional episodes of urinary tract infection.   Hx of CP in the past - had a  Normal cath November 09, 2013.  She has intermittent episodes of chest pain. 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 She also is thought to have gastroesophageal reflux disease.    Medications: Outpatient medications: Prescriptions prior to admission  Medication Sig Dispense Refill Last Dose  . aspirin EC 81 MG tablet Take 1 tablet (81 mg total) by mouth daily. 90 tablet 4 02/03/2015 at Unknown time  . Calcium Citrate-Vitamin D (CALCIUM CITRATE + D3 PO) Take 1 tablet by mouth 2 (two) times daily.   02/03/2015 at Unknown time  . diltiazem (CARDIZEM) 60 MG tablet Take 1 tablet (60 mg total) by mouth 3 (three) times daily. 270 tablet 1 02/03/2015 at Unknown time  . divalproex (DEPAKOTE ER) 500 MG 24 hr tablet Take 1 tablet (500 mg total) by mouth daily. 30 tablet 0 02/03/2015 at Unknown time  . Multiple Vitamins-Minerals (CENTRUM SILVER ADULT 50+ PO) Take 1 tablet by mouth daily.   02/03/2015 at Unknown time  . nitroGLYCERIN (NITROSTAT) 0.4 MG SL tablet PLACE 1 TABLET UNDER THE THE TONGUE AS DIRECTED EVERY 5 MINUTES AS NEEDED FOR CHEST PAIN 25 tablet 3 02/03/2015 at Unknown time  .  pantoprazole (PROTONIX) 40 MG tablet Take 1 tablet (40 mg total) by mouth daily. 30 tablet 0 02/03/2015 at Unknown time  . polyethylene glycol (MIRALAX / GLYCOLAX) packet Take 17 g by mouth daily as needed for mild constipation. 14 each 6 02/02/2015 at Unknown time  . risperiDONE (RISPERDAL) 2 MG tablet Take 4 mg by mouth at bedtime.    02/02/2015 at Unknown time  . vitamin C (ASCORBIC ACID) 500 MG tablet Take 500 mg by mouth daily.   02/03/2015 at Unknown time    Current medications: Current Facility-Administered Medications  Medication Dose Route Frequency Provider Last Rate Last Dose  . 0.9 %  sodium chloride infusion  250 mL Intravenous PRN Frazier Richards, MD      . acetaminophen (TYLENOL) tablet 650 mg  650 mg Oral Q6H PRN Frazier Richards, MD       Or  . acetaminophen (TYLENOL) suppository 650 mg  650 mg Rectal Q6H PRN Frazier Richards, MD      . aspirin EC tablet 81 mg  81 mg Oral Daily Frazier Richards, MD   81 mg at 02/04/15 1003  .  calcium-vitamin D (OSCAL WITH D) 500-200 MG-UNIT per tablet 1 tablet  1 tablet Oral BID Alveda Reasons, MD   1 tablet at 02/04/15 1004  . cephALEXin (KEFLEX) capsule 500 mg  500 mg Oral Q12H Alyssa A Haney, MD   500 mg at 02/04/15 1003  . diltiazem (CARDIZEM) tablet 60 mg  60 mg Oral TID Frazier Richards, MD   60 mg at 02/04/15 1003  . divalproex (DEPAKOTE ER) 24 hr tablet 500 mg  500 mg Oral Daily Frazier Richards, MD   500 mg at 02/04/15 1003  . heparin injection 5,000 Units  5,000 Units Subcutaneous 3 times per day Frazier Richards, MD   5,000 Units at 02/04/15 [phone]  . nitroGLYCERIN (NITROSTAT) SL tablet 0.4 mg  0.4 mg Sublingual Q5 min PRN Frazier Richards, MD      . pantoprazole (PROTONIX) EC tablet 40 mg  40 mg Oral Daily Frazier Richards, MD   40 mg at 02/04/15 1004  . polyethylene glycol (MIRALAX / GLYCOLAX) packet 17 g  17 g Oral Daily PRN Frazier Richards, MD      . risperiDONE (RISPERDAL) tablet 2 mg  2 mg Oral QHS Frazier Richards, MD   2 mg at 02/03/15 2228  . sodium  chloride 0.9 % injection 3 mL  3 mL Intravenous Q12H Frazier Richards, MD   3 mL at 02/03/15 1245  . sodium chloride 0.9 % injection 3 mL  3 mL Intravenous Q12H Frazier Richards, MD   3 mL at 02/04/15 1000  . sodium chloride 0.9 % injection 3 mL  3 mL Intravenous PRN Frazier Richards, MD         Allergies  Allergen Reactions  . Ativan [Lorazepam] Other (See Comments)    Increases fall risk and was told not to take it.  . Benztropine Mesylate Other (See Comments)    Restless legs  . Clonazepam Other (See Comments)    Increases fall risk and was told not to take it.  . Vicodin [Hydrocodone-Acetaminophen] Other (See Comments)    Increases fall risk and was told not to take it.  . Lisinopril Other (See Comments)    cough  . Sulfonamide Derivatives Other (See Comments)    Pt does not remember an allergic reaction to sulfa drugs     Past Medical History  Diagnosis Date  . Depression   . Gout   . Bipolar 1 disorder ([place])   . Schizoaffective disorder   . Chronic kidney disease   . Hypertension   . Seizure disorder ([place])   . Thrombocytopenia ([place])   . Anemia   . Normal coronary arteries   . Chronic diastolic CHF (congestive heart failure) ([place]) 10/10/2014    LVEDP elevated at time of cath    Past Surgical History  Procedure Laterality Date  . Tubal ligation    . Appendectomy    . Cardiac catheterization  2005    Normal  . Cardiac catheterization  11/09/2013    Normal  . Left heart catheterization with coronary angiogram N/A 11/09/2013    Procedure: LEFT HEART CATHETERIZATION WITH CORONARY ANGIOGRAM;  Surgeon: Leonie Man, MD;  Location: [facility] C[facility] LAB;  Service: Cardiovascular;  Laterality: N/A;    Family History  Problem Relation Age of Onset  . Heart attack Mother   . Heart disease Mother   . Heart attack Father   . Heart disease Father   . Hypertension Brother  Social History:  reports that she has never smoked. She has never used smokeless tobacco. She reports that she  does not drink alcohol or use illicit drugs.   Review of Systems: Constitutional:  denies fever, chills, diaphoresis, appetite change and fatigue.  HEENT: denies photophobia, eye pain, redness, hearing loss, ear pain, congestion, sore throat, rhinorrhea, sneezing, neck pain, neck stiffness and tinnitus.  Respiratory: denies SOB, DOE, cough, chest tightness, and wheezing.  Cardiovascular: admits to chest pain,- atypical   Gastrointestinal: denies nausea, vomiting, abdominal pain, diarrhea, constipation, blood in stool.  Genitourinary: admits to dysuria, urgency, frequency, hematuria, flank pain and difficulty urinating.  Musculoskeletal: denies  myalgias, back pain, joint swelling, arthralgias and gait problem.   Skin: denies pallor, rash and wound.  Neurological: denies dizziness, seizures, syncope, weakness, light-headedness, numbness and headaches.   Hematological: denies adenopathy, easy bruising, personal or family bleeding history.  Psychiatric/ Behavioral: denies suicidal ideation, mood changes, confusion, nervousness, sleep disturbance and agitation.    Physical Exam: BP 138/55 mmHg  Pulse 73  Temp(Src) 98.1 F (36.7 C) (Oral)  Resp 17  Ht 5\' 3"  (1.6 m)  Wt 141 lb 8.6 oz (64.2 kg)  BMI 25.08 kg/m2  SpO2 100%  Wt Readings from Last 3 Encounters:  02/03/15 141 lb 8.6 oz (64.2 kg)  02/03/15 143 lb (64.864 kg)  10/10/14 151 lb 3.2 oz (68.584 kg)    General: Vital signs reviewed and noted. Well-developed, well-nourished, in no acute distress; alert,   Head: Normocephalic, atraumatic, sclera anicteric,   Neck: Supple. Negative for carotid bruits. No JVD   Lungs:  Clear bilaterally, no  wheezes, rales, or rhonchi. Breathing is normal   Heart: RRR with S1 S2. No murmurs, rubs, or gallops   Abdomen/ GI :  Soft, non-tender, non-distended with normoactive bowel sounds. No hepatomegaly. No rebound/guarding. No obvious abdominal masses   MSK: Strength and the appear normal for age.    Extremities: No clubbing or cyanosis. No edema.  Distal pedal pulses are 2+ and equal   Neurologic:  CN are grossly intact,  No obvious motor or sensory defect.  Alert and oriented X 3. Moves all extremities spontaneously.  Psych: Responds to questions appropriately with a normal affect.     Lab results: Basic Metabolic Panel:  Recent Labs Lab 02/03/15 1330 02/04/15 0045  NA 140 140  K 4.4 4.5  CL 103 104  CO2 28 26  GLUCOSE 98 110*  BUN 10 17  CREATININE 1.35* 1.39*  CALCIUM 10.3 10.1  MG 1.9  --   PHOS 3.8  --     Liver Function Tests:  Recent Labs Lab 02/03/15 1330  AST 21  ALT 13*  ALKPHOS 62  BILITOT 0.3  PROT 6.7  ALBUMIN 3.8   No results for input(s): LIPASE, AMYLASE in the last 168 hours. No results for input(s): AMMONIA in the last 168 hours.  CBC:  Recent Labs Lab 02/03/15 1330 02/04/15 0045  WBC 9.0 8.4  NEUTROABS 7.1  --   HGB 11.7* 11.4*  HCT 36.7 35.4*  MCV 81.9 81.4  PLT 155 156    Cardiac Enzymes:  Recent Labs Lab 02/03/15 1330 02/03/15 1837 02/04/15 0045  TROPONINI <0.03 0.04* <0.03    BNP: Invalid input(s): POCBNP  CBG:  Recent Labs Lab 02/03/15 1321 02/04/15 0739  GLUCAP 102* 87    Coagulation Studies: No results for input(s): LABPROT, INR in the last 72 hours.   Other results: Personal review of EKG shows :  Normal  sinus rhythm and she has T-wave inversions in the lateral leads. She's had varying degrees of this T-wave inversion over the past several years.  Imaging:  No results found.     Assessment & Plan:  1. Chest pain: Her chest pain is very atypical. She had a normal cardiac catheterization last year. She has known esophageal spasm and has been taking nitroglycerin with relief.  Her troponin levels are normal. The EKG does not look significantly different than previous EKGs. I do not think that this needs any further workup.   2. Syncope: The patient had an episode of syncope after taking 2  sublingual nitroglycerin. This is also in the setting of a urinary tract infection. I think that the episode of syncopal can be  explained by hypotension related to the nitroglycerin intake. She's worn a heart monitor before and has not been found have any episodes of heart block.  She does have occasional PVCs  She seems to be stable. No active cardiac issues. Will sign off.  Call for questions   Ramond Dial., MD, [facility] 02/04/2015, 3:00 PM Office - [phone] Pager [phone]

## 2015-02-10 ENCOUNTER — Ambulatory Visit (INDEPENDENT_AMBULATORY_CARE_PROVIDER_SITE_OTHER): Payer: Medicare Other | Admitting: Family Medicine

## 2015-02-10 VITALS — BP 136/68 | HR 79 | Temp 97.8°F | Wt 147.6 lb

## 2015-02-10 DIAGNOSIS — N183 Chronic kidney disease, stage 3 unspecified: Secondary | ICD-10-CM

## 2015-02-10 DIAGNOSIS — R079 Chest pain, unspecified: Secondary | ICD-10-CM

## 2015-02-10 DIAGNOSIS — R002 Palpitations: Secondary | ICD-10-CM | POA: Diagnosis not present

## 2015-02-10 DIAGNOSIS — Z09 Encounter for follow-up examination after completed treatment for conditions other than malignant neoplasm: Secondary | ICD-10-CM

## 2015-02-10 LAB — COMPLETE METABOLIC PANEL WITH GFR
ALT: 13 U/L (ref 6–29)
AST: 21 U/L (ref 10–35)
Albumin: 4.4 g/dL (ref 3.6–5.1)
Alkaline Phosphatase: 71 U/L (ref 33–130)
BUN: 13 mg/dL (ref 7–25)
CALCIUM: 10.3 mg/dL (ref 8.6–10.4)
CHLORIDE: 97 mmol/L — AB (ref 98–110)
CO2: 25 mmol/L (ref 20–31)
Creat: 1.06 mg/dL — ABNORMAL HIGH (ref 0.60–0.93)
GFR, EST NON AFRICAN AMERICAN: 51 mL/min — AB (ref >=60)
GFR, Est African American: 59 mL/min — ABNORMAL LOW (ref >=60)
Glucose, Bld: 93 mg/dL (ref 65–99)
POTASSIUM: 4.3 mmol/L (ref 3.5–5.3)
SODIUM: 138 mmol/L (ref 135–146)
Total Bilirubin: 0.3 mg/dL (ref 0.2–1.2)
Total Protein: 7.5 g/dL (ref 6.1–8.1)

## 2015-02-10 NOTE — Progress Notes (Signed)
    Subjective   [name] is a 74 y.o. female that presents for a hospital follow-up  1. Chest pain: Patient has remained symptom-free until yesterday morning. Pain generally lasts 3-5 minutes with nitroglycerin. States that she initially has palpitations then progresses pain in her chest. She does have some anxiety during these episodes as well. She has no associated diaphoresis or dyspnea. Symptoms are non-exertional and only occur when she wakes up and uses the restroom (urinating). Nitroglycerin helps with symptoms. She has been prescribed Protonix which she thinks has helped. No syncope.  ROS Per HPI  Social History  Substance Use Topics  . Smoking status: Never Smoker   . Smokeless tobacco: Never Used  . Alcohol Use: No    Allergies  Allergen Reactions  . Ativan [Lorazepam] Other (See Comments)    Increases fall risk and was told not to take it.  . Benztropine Mesylate Other (See Comments)    Restless legs  . Clonazepam Other (See Comments)    Increases fall risk and was told not to take it.  . Vicodin [Hydrocodone-Acetaminophen] Other (See Comments)    Increases fall risk and was told not to take it.  . Lisinopril Other (See Comments)    cough  . Sulfonamide Derivatives Other (See Comments)    Pt does not remember an allergic reaction to sulfa drugs    Objective   BP 136/68 mmHg  Pulse 79  Temp(Src) 97.8 F (36.6 C) (Oral)  Wt 147 lb 9.6 oz (66.951 kg)  SpO2 99%  General: Well appearing, no distress Respiratory/Chest: Clear to auscultation bilaterally Cardiovascular: Regular rate and rhythm, II/VI mid-systolic murmur with no radiation to carotids.   Assessment and Plan   No orders of the defined types were placed in this encounter.    Chest pain: possibly related to anxiety. Symptoms not typical other than resolution with nitroglycerin. Cardiology feels low risk for ACS from evaluation inpatient. She follows yearly outpatient for previous  arrhythmias. Syncope: no recurrent symptoms Hospital follow-up CKD  Continue nitroglycerin prn  Continue Protonix  BMP  Follow-up with PCP regarding prediabetes management  Return precautions

## 2015-02-10 NOTE — Patient Instructions (Signed)
Thank you for coming to see me today. It was a pleasure. Today we talked about:   Chest pain/palpitations: I would like you to continue the Protonix. Also, we may need to explore anxiety as a cause for your symptoms. If this continues to be a problem, we may need to revisit your heart.   Please make an appointment to see Dr. Gerlean Ren for follow-up.  If you have any questions or concerns, please do not hesitate to call the office at [phone].  Sincerely,  Cordelia Poche, MD

## 2015-02-11 ENCOUNTER — Emergency Department (HOSPITAL_COMMUNITY)
Admission: EM | Admit: 2015-02-11 | Discharge: 2015-02-11 | Disposition: A | Discharge status: Home / Self Care | Payer: Medicare Other | Attending: Emergency Medicine | Admitting: Emergency Medicine

## 2015-02-11 ENCOUNTER — Encounter (HOSPITAL_COMMUNITY): Payer: Self-pay | Admitting: Emergency Medicine

## 2015-02-11 ENCOUNTER — Other Ambulatory Visit: Payer: Self-pay

## 2015-02-11 ENCOUNTER — Emergency Department (HOSPITAL_COMMUNITY): Payer: Medicare Other

## 2015-02-11 DIAGNOSIS — N189 Chronic kidney disease, unspecified: Secondary | ICD-10-CM | POA: Insufficient documentation

## 2015-02-11 DIAGNOSIS — Z79899 Other long term (current) drug therapy: Secondary | ICD-10-CM | POA: Insufficient documentation

## 2015-02-11 DIAGNOSIS — Z9889 Other specified postprocedural states: Secondary | ICD-10-CM | POA: Diagnosis not present

## 2015-02-11 DIAGNOSIS — Z862 Personal history of diseases of the blood and blood-forming organs and certain disorders involving the immune mechanism: Secondary | ICD-10-CM | POA: Insufficient documentation

## 2015-02-11 DIAGNOSIS — Z7982 Long term (current) use of aspirin: Secondary | ICD-10-CM | POA: Diagnosis not present

## 2015-02-11 DIAGNOSIS — R079 Chest pain, unspecified: Secondary | ICD-10-CM | POA: Insufficient documentation

## 2015-02-11 DIAGNOSIS — F3130 Bipolar disorder, current episode depressed, mild or moderate severity, unspecified: Secondary | ICD-10-CM | POA: Diagnosis not present

## 2015-02-11 DIAGNOSIS — Z8739 Personal history of other diseases of the musculoskeletal system and connective tissue: Secondary | ICD-10-CM | POA: Insufficient documentation

## 2015-02-11 DIAGNOSIS — R002 Palpitations: Secondary | ICD-10-CM | POA: Diagnosis not present

## 2015-02-11 DIAGNOSIS — I129 Hypertensive chronic kidney disease with stage 1 through stage 4 chronic kidney disease, or unspecified chronic kidney disease: Secondary | ICD-10-CM | POA: Diagnosis not present

## 2015-02-11 DIAGNOSIS — Z792 Long term (current) use of antibiotics: Secondary | ICD-10-CM | POA: Diagnosis not present

## 2015-02-11 DIAGNOSIS — I5032 Chronic diastolic (congestive) heart failure: Secondary | ICD-10-CM | POA: Insufficient documentation

## 2015-02-11 LAB — BASIC METABOLIC PANEL
Anion gap: 11 (ref 5–15)
BUN: 10 mg/dL (ref 6–20)
CALCIUM: 9.6 mg/dL (ref 8.9–10.3)
CO2: 27 mmol/L (ref 22–32)
CREATININE: 1.15 mg/dL — AB (ref 0.44–1.00)
Chloride: 99 mmol/L — ABNORMAL LOW (ref 101–111)
GFR calc Af Amer: 53 mL/min — ABNORMAL LOW (ref >60)
GFR, EST NON AFRICAN AMERICAN: 45 mL/min — AB (ref >60)
GLUCOSE: 95 mg/dL (ref 65–99)
Potassium: 3.9 mmol/L (ref 3.5–5.1)
Sodium: 137 mmol/L (ref 135–145)

## 2015-02-11 LAB — I-STAT TROPONIN, ED: Troponin i, poc: 0.02 ng/mL (ref 0.00–0.08)

## 2015-02-11 LAB — CBC WITH DIFFERENTIAL/PLATELET
Basophils Absolute: 0 K/uL (ref 0.0–0.1)
Basophils Relative: 0 %
Eosinophils Absolute: 0 K/uL (ref 0.0–0.7)
Eosinophils Relative: 0 %
HCT: 36 % (ref 36.0–46.0)
Hemoglobin: 11.7 g/dL — ABNORMAL LOW (ref 12.0–15.0)
Lymphocytes Relative: 39 %
Lymphs Abs: 1.8 K/uL (ref 0.7–4.0)
MCH: 26.3 pg (ref 26.0–34.0)
MCHC: 32.5 g/dL (ref 30.0–36.0)
MCV: 80.9 fL (ref 78.0–100.0)
Monocytes Absolute: 0.4 K/uL (ref 0.1–1.0)
Monocytes Relative: 9 %
Neutro Abs: 2.5 K/uL (ref 1.7–7.7)
Neutrophils Relative %: 52 %
Platelets: 140 K/uL — ABNORMAL LOW (ref 150–400)
RBC: 4.45 MIL/uL (ref 3.87–5.11)
RDW: 16.6 % — ABNORMAL HIGH (ref 11.5–15.5)
WBC: 4.7 K/uL (ref 4.0–10.5)

## 2015-02-11 LAB — TROPONIN I: Troponin I: <0.03 ng/mL (ref <0.031)

## 2015-02-11 MED ORDER — NITROGLYCERIN 0.4 MG SL SUBL: 0.4000 mg | SUBLINGUAL_TABLET | SUBLINGUAL | Status: DC | PRN

## 2015-02-11 MED ORDER — ASPIRIN 325 MG PO TABS: 325.0000 mg | ORAL_TABLET | Freq: Once | ORAL | Status: DC

## 2015-02-11 NOTE — ED Notes (Signed)
Pt states she awoke this morning with chest pain. Pt took three nitroglycerin pills with no relief. Pt took 81 mg of ASA just prior to EMS arrival also. Pt denied SOB, dizziness, or nausea, but stated she has jaw pain bilaterally. Pt arrived to the ER chest pain free.

## 2015-02-11 NOTE — ED Provider Notes (Signed)
CSN: [id number]     Arrival date & time 02/11/15  4496 History   First MD Initiated Contact with Patient 02/11/15 [phone]     Chief Complaint  Patient presents with  . Chest Pain   HPI   74 year old female presents today with chest pain. Patient reports that she was having a nightmare last night around 2 AM, woke up with centralized stabbing chest pain, heart palpitations, radiation into her jaw. Patient reports she took 3 doses of her nitroglycerin and aspirin which relieved the symptoms. Patient reports a history of the same which she's been admitted to the hospital with cardiac evaluation. Patient had a normal catheterization in July 2016, with subsequent follow-up. She reports these symptoms are typical of her chest pain. Patient denies any loss of consciousness, nausea, vomiting, abdominal pain, reflux, or any other significant signs or symptoms. Patient is a nonsmoker, no history of MI, h/o hyperlipidemia, HTN.  At time of evaluation patient reports she is asymptomatic.   Past Medical History  Diagnosis Date  . Depression   . Gout   . Bipolar 1 disorder ([place])   . Schizoaffective disorder   . Chronic kidney disease   . Hypertension   . Seizure disorder ([place])   . Thrombocytopenia ([place])   . Anemia   . Normal coronary arteries   . Chronic diastolic CHF (congestive heart failure) ([place]) 10/10/2014    LVEDP elevated at time of cath   Past Surgical History  Procedure Laterality Date  . Tubal ligation    . Appendectomy    . Cardiac catheterization  2005    Normal  . Cardiac catheterization  11/09/2013    Normal  . Left heart catheterization with coronary angiogram N/A 11/09/2013    Procedure: LEFT HEART CATHETERIZATION WITH CORONARY ANGIOGRAM;  Surgeon: Leonie Man, MD;  Location: [facility] CATH LAB;  Service: Cardiovascular;  Laterality: N/A;   Family History  Problem Relation Age of Onset  . Heart attack Mother   . Heart disease Mother   . Heart attack Father   . Heart disease  Father   . Hypertension Brother    Social History  Substance Use Topics  . Smoking status: Never Smoker   . Smokeless tobacco: Never Used  . Alcohol Use: No   OB History    No data available     Review of Systems  All other systems reviewed and are negative.   Allergies  Ativan; Benztropine mesylate; Clonazepam; Vicodin; Lisinopril; and Sulfonamide derivatives  Home Medications   Prior to Admission medications   Medication Sig Start Date End Date Taking? Authorizing Provider  aspirin EC 81 MG tablet Take 1 tablet (81 mg total) by mouth daily. 01/03/14  Yes Clarkton N Rumley, DO  Calcium Citrate-Vitamin D (CALCIUM CITRATE + D3 PO) Take 1 tablet by mouth 2 (two) times daily.   Yes Historical Provider, MD  cephALEXin (KEFLEX) 500 MG capsule Take 500 mg by mouth 2 (two) times daily.   Yes Historical Provider, MD  diclofenac sodium (VOLTAREN) 1 % GEL Apply 2 g topically 2 (two) times daily as needed (pain).   Yes Historical Provider, MD  diltiazem (CARDIZEM) 60 MG tablet Take 1 tablet (60 mg total) by mouth 3 (three) times daily. 12/20/14  Yes Sueanne Margarita, MD  divalproex (DEPAKOTE ER) 500 MG 24 hr tablet Take 1 tablet (500 mg total) by mouth daily. 01/18/13  Yes Hilton Sinclair, MD  Multiple Vitamins-Minerals (CENTRUM SILVER ADULT 50+ PO) Take 1 tablet by  mouth daily.   Yes Historical Provider, MD  nitroGLYCERIN (NITROSTAT) 0.4 MG SL tablet PLACE 1 TABLET UNDER THE THE TONGUE AS DIRECTED EVERY 5 MINUTES AS NEEDED FOR CHEST PAIN 01/18/15  Yes Sueanne Margarita, MD  pantoprazole (PROTONIX) 40 MG tablet Take 1 tablet (40 mg total) by mouth daily. 09/20/14  Yes Taft N Rumley, DO  polyethylene glycol (MIRALAX / GLYCOLAX) packet Take 17 g by mouth daily as needed for mild constipation. 01/26/15  Yes Haliimaile N Rumley, DO  Polyvinyl Alcohol-Povidone (REFRESH OP) Apply 1-2 drops to eye at bedtime as needed (dry eyes).   Yes Historical Provider, MD  risperiDONE (RISPERDAL) 2 MG tablet Take 2 mg  by mouth at bedtime.    Yes Historical Provider, MD  vitamin C (ASCORBIC ACID) 500 MG tablet Take 500 mg by mouth daily.   Yes Historical Provider, MD   BP 135/65 mmHg  Pulse 64  Temp(Src) 98.1 F (36.7 C) (Oral)  Resp 19  SpO2 100%   Physical Exam  Constitutional: She is oriented to person, place, and time. She appears well-developed and well-nourished.  HENT:  Head: Normocephalic and atraumatic.  Eyes: Conjunctivae are normal. Pupils are equal, round, and reactive to light. Right eye exhibits no discharge. Left eye exhibits no discharge. No scleral icterus.  Neck: Normal range of motion. No JVD present. No tracheal deviation present.  Pulmonary/Chest: Effort normal. No stridor.  No TTP of chest wall, no signs of trauma  Abdominal: Soft. She exhibits no distension and no mass. There is no tenderness. There is no rebound and no guarding.  Musculoskeletal: Normal range of motion. She exhibits no edema or tenderness.  Neurological: She is alert and oriented to person, place, and time. Coordination normal.  Skin: Skin is warm and dry. No rash noted. No erythema. No pallor.  Psychiatric: She has a normal mood and affect. Her behavior is normal. Judgment and thought content normal.  Nursing note and vitals reviewed.      ED Course  Procedures (including critical care time) Labs Review Labs Reviewed  CBC WITH DIFFERENTIAL/PLATELET - Abnormal; Notable for the following:    Hemoglobin 11.7 (*)    RDW 16.6 (*)    Platelets 140 (*)    All other components within normal limits  BASIC METABOLIC PANEL - Abnormal; Notable for the following:    Chloride 99 (*)    Creatinine, Ser 1.15 (*)    GFR calc non Af Amer 45 (*)    GFR calc Af Amer 53 (*)    All other components within normal limits  TROPONIN I  I-STAT TROPOININ, ED    Imaging Review Dg Chest 2 View  02/11/2015  CLINICAL DATA:  Acute onset of left-sided chest pain and jaw tightness. Initial encounter. EXAM: CHEST  2 VIEW  COMPARISON:  Chest radiograph performed 08/04/2014 FINDINGS: The lungs are well-aerated and clear. There is no evidence of focal opacification, pleural effusion or pneumothorax. The heart is normal in size; the mediastinal contour is within normal limits. No acute osseous abnormalities are seen. IMPRESSION: No acute cardiopulmonary process seen. Electronically Signed   By: Garald Balding M.D.   On: 02/11/2015 06:20   I have personally reviewed and evaluated these images and lab results as part of my medical decision-making.   EKG Interpretation None      MDM   Final diagnoses:  Chest pain, unspecified chest pain type    Labs: I-STAT troponin, CBC, BMP  Imaging: DG chest, EKG  Consults:  Therapeutics:  Discharge Meds:   Assessment/Plan: Patient presents with chest pain. Patient has a history of the same, relieved with nitroglycerin and aspirin. At the time of evaluation patient was asymptomatic. Due to patient's previous history of atypical chest pain, age, risk factors she will receive delta troponin here in the ED.  Repeat troponins negative. The ED, no significant changes on EKG, symptoms completely resolved. She has a history of the same, likely due to anxiety, low suspicion for PE, low ACS Suspicion due to hard score 4 and lack of current symptoms. Patient will be discharged home instructed follow-up with cardiologist for further evaluation and management, she'll return precautions given patient verbalizes understanding and agreement for today's plan.          Okey Regal, PA-C 02/11/15 [place], MD 02/14/15 [phone]

## 2015-02-11 NOTE — ED Notes (Signed)
Meal tray delivered.

## 2015-02-11 NOTE — Discharge Instructions (Signed)
Please follow-up with her cardiologist for further evaluation and management this week. If new or worsening signs or symptoms present please return immediately for further evaluation. He was informed her primary care provider if today's visit and all relevant data.

## 2015-02-11 NOTE — ED Notes (Signed)
Breakfast tray ordered for pt

## 2015-02-13 ENCOUNTER — Telehealth: Payer: Self-pay | Admitting: Family Medicine

## 2015-02-13 NOTE — Telephone Encounter (Signed)
Pt calling to retrieve lab results from Friday. Please contact pt with results at the earliest convenience. Thank you, Fonda Kinder, ASA

## 2015-02-17 ENCOUNTER — Ambulatory Visit (INDEPENDENT_AMBULATORY_CARE_PROVIDER_SITE_OTHER): Payer: Medicare Other | Admitting: Family Medicine

## 2015-02-17 VITALS — BP 153/57 | HR 70 | Temp 97.9°F | Wt 147.1 lb

## 2015-02-17 DIAGNOSIS — R0789 Other chest pain: Secondary | ICD-10-CM

## 2015-02-17 DIAGNOSIS — R739 Hyperglycemia, unspecified: Secondary | ICD-10-CM | POA: Diagnosis not present

## 2015-02-17 NOTE — Patient Instructions (Signed)
Thank you so much for coming to visit me today! Please continue to work on your diet and exercise. I have given you Dr. Jenne Campus contact information and you may set up an appointment here to discuss Nutrition.  If you develop chest pain that does not improve with Nitroglycerin or is different than what you are used to in any way, please call [phone]. Please follow up with your Cardiologist.  Thanks again! Dr. Gerlean Ren

## 2015-02-18 NOTE — Telephone Encounter (Signed)
Results discussed with PCP.

## 2015-02-19 DIAGNOSIS — R739 Hyperglycemia, unspecified: Secondary | ICD-10-CM | POA: Insufficient documentation

## 2015-02-19 NOTE — Progress Notes (Signed)
Subjective:     Patient ID: [name], female   DOB: 1940-10-13, 74 y.o.   MRN: [id number]  HPI [name] is a 74yo female presenting today for follow up following an Emergency Room visit for chest pain.  # Chest Pain: - Visited ED for complaint on 02/11/15. Improved with Aspirin and Nitroglycerin. Chest pain consistent with prior episodes. States she woke up from a nightmare and then noticed the chest pain. - Has not noted any chest pain since visit to ED. - Denies shortness of breath - History of normal catheterization in July 2016 - Next visit with Cardiology January 2017  # Hyperglycemia: - States she was told at recent trip to ED that her glucose was elevated - Chart review shows most glucose levels in the 90s with one measurement elevated to 110 on 02/04/15. A1C of 6.2 noted on 02/03/15. - Not currently on medication - Has been working on diet and exercise. Very interested in meeting with nutritionist.  Review of Systems Per HPI    Objective:   Physical Exam  Constitutional: She appears well-developed and well-nourished. No distress.  HENT:  Head: Normocephalic and atraumatic.  Mouth/Throat: No oropharyngeal exudate.  Cardiovascular: Normal rate and regular rhythm.  Exam reveals no gallop and no friction rub.   No murmur heard. Pulmonary/Chest: Effort normal. No respiratory distress. She has no wheezes. She has no rales.  Abdominal: Soft. She exhibits no distension. There is no tenderness. There is no rebound.  Musculoskeletal: She exhibits no edema.  Psychiatric: She has a normal mood and affect. Her behavior is normal.      Assessment and Plan:     Chest pain, atypical - Chest pain consistent with prior. Suspect anxiety component given history of nightmare right before pain occurred. - Normal heart catheterization in July - Continue current medications - Follow up with Cardiology in January 2017 - If further chest pain occurs that does not resolve with  Nitroglycerin, call 911  Hyperglycemia - A1C 6.2 - Continue to work on diet and exercise - Phone number given for nutritionist Dr. Jenne Campus. Schedule appointment as able - Continue to monitor

## 2015-02-19 NOTE — Assessment & Plan Note (Signed)
-   A1C 6.2 - Continue to work on diet and exercise - Phone number given for nutritionist Dr. Jenne Campus. Schedule appointment as able - Continue to monitor

## 2015-02-19 NOTE — Assessment & Plan Note (Signed)
-   Chest pain consistent with prior. Suspect anxiety component given history of nightmare right before pain occurred. - Normal heart catheterization in July - Continue current medications - Follow up with Cardiology in January 2017 - If further chest pain occurs that does not resolve with Nitroglycerin, call [phone]

## 2015-03-14 DIAGNOSIS — F319 Bipolar disorder, unspecified: Secondary | ICD-10-CM | POA: Diagnosis not present

## 2015-03-15 ENCOUNTER — Ambulatory Visit (INDEPENDENT_AMBULATORY_CARE_PROVIDER_SITE_OTHER): Payer: Medicare Other | Admitting: Family Medicine

## 2015-03-15 ENCOUNTER — Encounter: Payer: Self-pay | Admitting: Family Medicine

## 2015-03-15 VITALS — BP 150/68 | HR 102 | Temp 98.4°F | Wt 147.0 lb

## 2015-03-15 DIAGNOSIS — N390 Urinary tract infection, site not specified: Secondary | ICD-10-CM | POA: Diagnosis not present

## 2015-03-15 DIAGNOSIS — R319 Hematuria, unspecified: Secondary | ICD-10-CM

## 2015-03-15 LAB — POCT UA - MICROSCOPIC ONLY

## 2015-03-15 LAB — POCT URINALYSIS DIPSTICK
BILIRUBIN UA: NEGATIVE
GLUCOSE UA: NEGATIVE
Ketones, UA: NEGATIVE
Nitrite, UA: NEGATIVE
Protein, UA: 100
SPEC GRAV UA: 1.015
UROBILINOGEN UA: 0.2
pH, UA: 7.5

## 2015-03-15 MED ORDER — CEPHALEXIN 500 MG PO CAPS: 500.0000 mg | ORAL_CAPSULE | Freq: Four times a day (QID) | ORAL | Status: DC

## 2015-03-15 NOTE — Assessment & Plan Note (Signed)
-   Urinalysis cloudy with large 3+ leukocytes. Urine culture sent. - Given symptoms, suspect very beginning stage of UTI - Keflex 500mg  four time daily x7 days prescribed - Follow up if no improvement.

## 2015-03-15 NOTE — Progress Notes (Signed)
Subjective:     Patient ID: [name], female   DOB: 12/28/1940, 74 y.o.   MRN: [id number]  HPI Mrs. Masarik is a 74yo female presenting today for dysuria. - Notes burning with urination since this morning. Felt like urinary tract infection was "coming on" yesterday, but did not have pain at that time. - Increased urgency - Denies increased frequency, changes in color or smell - Noted blood on tissue after wiping following urination - Has not tried any medications at this time - Has had UTIs in the past and states this feels similar to prior - Denies fevers or chills, abdominal pain, flank pain  - Smoking status discussed  Review of Systems Per HPI    Objective:   Physical Exam  Constitutional: She appears well-developed and well-nourished. No distress.  HENT:  Head: Normocephalic and atraumatic.  Cardiovascular: Normal rate and regular rhythm.  Exam reveals no gallop and no friction rub.   No murmur heard. Pulmonary/Chest: Effort normal. No respiratory distress. She has no wheezes.  Abdominal: Soft. She exhibits no distension. There is no tenderness.  Psychiatric: She has a normal mood and affect. Her behavior is normal.      Assessment:     UTI (urinary tract infection) - Urinalysis cloudy with large 3+ leukocytes. Urine culture sent. - Given symptoms, suspect very beginning stage of UTI - Keflex 500mg  four time daily x7 days prescribed - Follow up if no improvement.

## 2015-03-15 NOTE — Patient Instructions (Addendum)
Thank you so much for coming to visit today. Your urine showed signs of infection. I have sent an antibiotic to the pharmacy for you to take four times a day for seven days. If your symptoms do no improve or if you develop flank pain with fevers or chills, please let us know.  Thanks again! Dr. Gerlean Ren

## 2015-03-17 LAB — URINE CULTURE: Colony Count: 8000

## 2015-03-21 ENCOUNTER — Other Ambulatory Visit: Payer: Self-pay | Admitting: Family Medicine

## 2015-03-23 ENCOUNTER — Encounter: Payer: Self-pay | Admitting: Podiatry

## 2015-03-23 ENCOUNTER — Ambulatory Visit (INDEPENDENT_AMBULATORY_CARE_PROVIDER_SITE_OTHER): Payer: Medicare Other | Admitting: Podiatry

## 2015-03-23 DIAGNOSIS — B351 Tinea unguium: Secondary | ICD-10-CM

## 2015-03-23 DIAGNOSIS — M79675 Pain in left toe(s): Secondary | ICD-10-CM

## 2015-03-23 DIAGNOSIS — M79674 Pain in right toe(s): Secondary | ICD-10-CM

## 2015-03-23 DIAGNOSIS — M79676 Pain in unspecified toe(s): Secondary | ICD-10-CM

## 2015-03-23 NOTE — Progress Notes (Signed)
Patient ID: [name], female   DOB: 1941/03/29, 74 y.o.   MRN: [id number] I need my nails and corn trimmed. Complaint:  Visit Type: Patient returns to my office for continued preventative foot care services. Complaint: Patient states" my nails have grown long and thick and become painful to walk and wear shoes  . She presents for preventative foot care services. No changes to ROS  Podiatric Exam: Vascular: dorsalis pedis and posterior tibial pulses are palpable bilateral. Capillary return is immediate. Temperature gradient is WNL. Skin turgor WNL  Sensorium: Normal Semmes Weinstein monofilament test. Normal tactile sensation bilaterally. Nail Exam: Pt has thick disfigured discolored nails with subungual debris noted bilateral entire nail hallux through fifth toenails Ulcer Exam: There is no evidence of ulcer or pre-ulcerative changes or infection. Orthopedic Exam: Muscle tone and strength are WNL. No limitations in general ROM. No crepitus or effusions noted. Foot type and digits show no abnormalities. Bony prominences are unremarkable.  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 Skin: No Porokeratosis. No infection or ulcers.  Distal clavi second toe left foot asymptomatic  Diagnosis:  Tinea unguium, Pain in right toe, pain in left toes  Treatment & Plan Procedures and Treatment: Consent by patient was obtained for treatment procedures. The patient understood the discussion of treatment and procedures well. All questions were answered thoroughly reviewed. Debridement of mycotic and hypertrophic toenails, 1 through 5 bilateral and clearing of subungual debris. No ulceration, no infection noted.  Return Visit-Office Procedure: Patient instructed to return to the office for a follow up visit 3 months for continued evaluation and treatment.  Gardiner Barefoot DPM

## 2015-03-30 DIAGNOSIS — Z79899 Other long term (current) drug therapy: Secondary | ICD-10-CM | POA: Diagnosis not present

## 2015-04-04 ENCOUNTER — Telehealth: Payer: Self-pay | Admitting: Family Medicine

## 2015-04-04 NOTE — Telephone Encounter (Signed)
Now there is blood after she wipes

## 2015-04-04 NOTE — Telephone Encounter (Signed)
Please have her schedule appointment to be seen.

## 2015-04-04 NOTE — Telephone Encounter (Signed)
Pt just was treated UTI. She is starting the same symptons again. She would like a refill on the medication. She can be reached at  [phone]

## 2015-04-05 NOTE — Telephone Encounter (Signed)
Pt is returning call and is no longer needing medication or to come in. jw

## 2015-04-05 NOTE — Telephone Encounter (Signed)
LVM for pt to call the office. If pt calls, please make an apt for her to be seen SDA. Ottis Stain, CMA\

## 2015-04-26 ENCOUNTER — Other Ambulatory Visit: Payer: Self-pay | Admitting: *Deleted

## 2015-04-26 MED ORDER — ASPIRIN EC 81 MG PO TBEC: 81.0000 mg | DELAYED_RELEASE_TABLET | Freq: Every day | ORAL | Status: DC

## 2015-04-28 ENCOUNTER — Ambulatory Visit (INDEPENDENT_AMBULATORY_CARE_PROVIDER_SITE_OTHER): Payer: Medicare Other | Admitting: Family Medicine

## 2015-04-28 VITALS — BP 118/59 | HR 75 | Temp 98.7°F | Wt 140.9 lb

## 2015-04-28 DIAGNOSIS — J069 Acute upper respiratory infection, unspecified: Secondary | ICD-10-CM

## 2015-04-28 DIAGNOSIS — R300 Dysuria: Secondary | ICD-10-CM | POA: Diagnosis present

## 2015-04-28 DIAGNOSIS — N390 Urinary tract infection, site not specified: Secondary | ICD-10-CM | POA: Diagnosis not present

## 2015-04-28 DIAGNOSIS — R319 Hematuria, unspecified: Secondary | ICD-10-CM | POA: Diagnosis not present

## 2015-04-28 LAB — POCT URINALYSIS DIPSTICK
BILIRUBIN UA: NEGATIVE
Glucose, UA: NEGATIVE
Ketones, UA: NEGATIVE
Leukocytes, UA: NEGATIVE
NITRITE UA: NEGATIVE
PH UA: 7
Protein, UA: NEGATIVE
RBC UA: NEGATIVE
Spec Grav, UA: <=1.005
UROBILINOGEN UA: 0.2

## 2015-04-28 MED ORDER — AZITHROMYCIN 250 MG PO TABS: ORAL_TABLET | ORAL | Status: DC

## 2015-04-28 MED ORDER — BENZONATATE 100 MG PO CAPS: 100.0000 mg | ORAL_CAPSULE | Freq: Three times a day (TID) | ORAL | Status: DC | PRN

## 2015-04-28 NOTE — Patient Instructions (Signed)
Thank you so much for coming to visit today! I have sent in a prescription for a cough medicine. We will check your urine today. Once it comes back, I will send in a prescription for an antibiotic. Your upper respiratory symptoms are most likely due to a virus and should resolve on their own. The cough, however, may linger for a few weeks after the rest of the viral symptoms resolve.  Thanks again! Dr. Gerlean Ren

## 2015-04-30 DIAGNOSIS — J069 Acute upper respiratory infection, unspecified: Secondary | ICD-10-CM | POA: Insufficient documentation

## 2015-04-30 NOTE — Assessment & Plan Note (Signed)
-   Urinalysis negative.

## 2015-04-30 NOTE — Assessment & Plan Note (Addendum)
-   Suspect viral etiology - Prescription for Azithromycin given. Recommend taking only if fails to improve over the next several days. - Tessalon - Follow up if no improvement

## 2015-04-30 NOTE — Progress Notes (Signed)
Subjective:     Patient ID: [name], female   DOB: [birth year], 75 y.o.   MRN: [id number]  HPI [name] is a 75yo female presenting today for URI.  Has been sick for 5 days (Started 19/17) Nasal discharge: Present Medications tried: Coricidin, Tea, Lemons Sick contacts: Brother  Symptoms Fever: Denies Headache or face pain: Denies Tooth pain: Denies Sneezing: Present Scratchy throat: Initially, Resolved Allergies: Denies Muscle aches: Denies Severe fatigue: Denies Stiff neck: Denies Shortness of breath: Denies Rash: Denies Sore throat or swollen glands: Denies Cough: Present Loose Stools: Present  Also of note, reports mild dysuria, increased frequency, increased urgency. Insists on prescription for antibiotics  Smoking Status noted  Review of Systems Per HPI. Others negative.    Objective:   Physical Exam  Constitutional: She appears well-developed and well-nourished. No distress.  HENT:  Tympanic membranes normal bilaterally. Pharynx normal. No lymphadenopathy. No sinus pressure.  Cardiovascular: Normal rate and regular rhythm.   Pulmonary/Chest: Effort normal. No respiratory distress. She has no wheezes.  Abdominal: Soft. She exhibits no distension. There is no tenderness.  Musculoskeletal: She exhibits no edema.  Skin: No rash noted.  Psychiatric: She has a normal mood and affect. Her behavior is normal.       Assessment and Plan:     **

## 2015-05-04 ENCOUNTER — Other Ambulatory Visit: Payer: Self-pay | Admitting: *Deleted

## 2015-05-04 MED ORDER — DILTIAZEM HCL 60 MG PO TABS: 60.0000 mg | ORAL_TABLET | Freq: Three times a day (TID) | ORAL | Status: DC

## 2015-05-05 ENCOUNTER — Telehealth: Payer: Self-pay | Admitting: Family Medicine

## 2015-05-05 NOTE — Telephone Encounter (Signed)
Pt called back. Says she has the sensation that her body is numb. She didn't feel like this until she started taking this medicine. Her chest feels like pressure from coughing

## 2015-05-05 NOTE — Telephone Encounter (Signed)
Spoke to pt. Informed her she needs to go to Urgent Care or the ED. She said she would. I called her back to verify she wouldn't be driving. She said she has someone to take her. Ottis Stain, CMA

## 2015-05-05 NOTE — Telephone Encounter (Signed)
New numbness should be evaluated. Please have her go to Urgent Care or ED!

## 2015-05-05 NOTE — Telephone Encounter (Signed)
Legs are numb.  She thinks it is related to the zpack.  She didn't have any trouble until yesterday.  She has one pill left. Would like to take to Dr---why is she numb? Is it one of the side effects? Should she continue to take it?

## 2015-06-22 ENCOUNTER — Encounter: Payer: Self-pay | Admitting: Podiatry

## 2015-06-22 ENCOUNTER — Ambulatory Visit (INDEPENDENT_AMBULATORY_CARE_PROVIDER_SITE_OTHER): Payer: Medicare Other | Admitting: Podiatry

## 2015-06-22 DIAGNOSIS — B351 Tinea unguium: Secondary | ICD-10-CM

## 2015-06-22 DIAGNOSIS — M79676 Pain in unspecified toe(s): Secondary | ICD-10-CM | POA: Diagnosis not present

## 2015-06-22 DIAGNOSIS — M79675 Pain in left toe(s): Secondary | ICD-10-CM | POA: Diagnosis not present

## 2015-06-22 NOTE — Progress Notes (Signed)
Patient ID: [name], female   DOB: 04-25-1940, 75 y.o.   MRN: [id number] I Visit Type: Patient returns to my office for continued preventative foot care services. Complaint: Patient states" my nails have grown long and thick and become painful to walk and wear shoes  . She presents for preventative foot care services. No changes to ROS  Podiatric Exam: Vascular: dorsalis pedis and posterior tibial pulses are palpable bilateral. Capillary return is immediate. Temperature gradient is WNL. Skin turgor WNL  Sensorium: Normal Semmes Weinstein monofilament test. Normal tactile sensation bilaterally. Nail Exam: Pt has thick disfigured discolored nails with subungual debris noted bilateral entire nail hallux through fifth toenails Ulcer Exam: There is no evidence of ulcer or pre-ulcerative changes or infection. Orthopedic Exam: Muscle tone and strength are WNL. No limitations in general ROM. No crepitus or effusions noted. Foot type and digits show no abnormalities. Bony prominences are unremarkable.  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 Skin: No Porokeratosis. No infection or ulcers.  Distal clavi second toe left foot asymptomatic  Diagnosis:  Tinea unguium, Pain in right toe, pain in left toes  Treatment & Plan Procedures and Treatment: Consent by patient was obtained for treatment procedures. The patient understood the discussion of treatment and procedures well. All questions were answered thoroughly reviewed. Debridement of mycotic and hypertrophic toenails, 1 through 5 bilateral and clearing of subungual debris. No ulceration, no infection noted.  Return Visit-Office Procedure: Patient instructed to return to the office for a follow up visit 3 months for continued evaluation and treatment.  Gardiner Barefoot DPM

## 2015-06-24 ENCOUNTER — Emergency Department (HOSPITAL_COMMUNITY)
Admission: EM | Admit: 2015-06-24 | Discharge: 2015-06-24 | Disposition: A | Discharge status: Home / Self Care | Payer: Medicare Other | Attending: Emergency Medicine | Admitting: Emergency Medicine

## 2015-06-24 ENCOUNTER — Encounter (HOSPITAL_COMMUNITY): Payer: Self-pay | Admitting: Emergency Medicine

## 2015-06-24 DIAGNOSIS — I5032 Chronic diastolic (congestive) heart failure: Secondary | ICD-10-CM | POA: Insufficient documentation

## 2015-06-24 DIAGNOSIS — Z9889 Other specified postprocedural states: Secondary | ICD-10-CM | POA: Diagnosis not present

## 2015-06-24 DIAGNOSIS — N390 Urinary tract infection, site not specified: Secondary | ICD-10-CM | POA: Diagnosis not present

## 2015-06-24 DIAGNOSIS — Z8739 Personal history of other diseases of the musculoskeletal system and connective tissue: Secondary | ICD-10-CM | POA: Insufficient documentation

## 2015-06-24 DIAGNOSIS — F319 Bipolar disorder, unspecified: Secondary | ICD-10-CM | POA: Diagnosis not present

## 2015-06-24 DIAGNOSIS — Z7982 Long term (current) use of aspirin: Secondary | ICD-10-CM | POA: Diagnosis not present

## 2015-06-24 DIAGNOSIS — G40909 Epilepsy, unspecified, not intractable, without status epilepticus: Secondary | ICD-10-CM | POA: Insufficient documentation

## 2015-06-24 DIAGNOSIS — N189 Chronic kidney disease, unspecified: Secondary | ICD-10-CM | POA: Insufficient documentation

## 2015-06-24 DIAGNOSIS — Z862 Personal history of diseases of the blood and blood-forming organs and certain disorders involving the immune mechanism: Secondary | ICD-10-CM | POA: Insufficient documentation

## 2015-06-24 DIAGNOSIS — F259 Schizoaffective disorder, unspecified: Secondary | ICD-10-CM | POA: Diagnosis not present

## 2015-06-24 DIAGNOSIS — I129 Hypertensive chronic kidney disease with stage 1 through stage 4 chronic kidney disease, or unspecified chronic kidney disease: Secondary | ICD-10-CM | POA: Diagnosis not present

## 2015-06-24 DIAGNOSIS — R300 Dysuria: Secondary | ICD-10-CM | POA: Diagnosis present

## 2015-06-24 DIAGNOSIS — Z79899 Other long term (current) drug therapy: Secondary | ICD-10-CM | POA: Insufficient documentation

## 2015-06-24 LAB — CBC
HCT: 33.5 % — ABNORMAL LOW (ref 36.0–46.0)
Hemoglobin: 11.3 g/dL — ABNORMAL LOW (ref 12.0–15.0)
MCH: 27.2 pg (ref 26.0–34.0)
MCHC: 33.7 g/dL (ref 30.0–36.0)
MCV: 80.7 fL (ref 78.0–100.0)
PLATELETS: 150 10*3/uL (ref 150–400)
RBC: 4.15 MIL/uL (ref 3.87–5.11)
RDW: 16.6 % — AB (ref 11.5–15.5)
WBC: 5.9 10*3/uL (ref 4.0–10.5)

## 2015-06-24 LAB — URINE MICROSCOPIC-ADD ON

## 2015-06-24 LAB — BASIC METABOLIC PANEL
ANION GAP: 15 (ref 5–15)
BUN: 15 mg/dL (ref 6–20)
CALCIUM: 10.3 mg/dL (ref 8.9–10.3)
CO2: 26 mmol/L (ref 22–32)
Chloride: 102 mmol/L (ref 101–111)
Creatinine, Ser: 1.14 mg/dL — ABNORMAL HIGH (ref 0.44–1.00)
GFR calc Af Amer: 53 mL/min — ABNORMAL LOW (ref >60)
GFR, EST NON AFRICAN AMERICAN: 46 mL/min — AB (ref >60)
GLUCOSE: 128 mg/dL — AB (ref 65–99)
Potassium: 4.1 mmol/L (ref 3.5–5.1)
Sodium: 143 mmol/L (ref 135–145)

## 2015-06-24 LAB — URINALYSIS, ROUTINE W REFLEX MICROSCOPIC
Bilirubin Urine: NEGATIVE
Glucose, UA: NEGATIVE mg/dL
Ketones, ur: NEGATIVE mg/dL
NITRITE: NEGATIVE
Protein, ur: NEGATIVE mg/dL
SPECIFIC GRAVITY, URINE: 1.005 (ref 1.005–1.030)
pH: 6.5 (ref 5.0–8.0)

## 2015-06-24 MED ORDER — CEPHALEXIN 250 MG PO CAPS
500.0000 mg | ORAL_CAPSULE | Freq: Once | ORAL | Status: AC
  Administered 2015-06-24: 500 mg via ORAL
  Filled 2015-06-24: qty 2

## 2015-06-24 MED ORDER — CEPHALEXIN 500 MG PO CAPS: 500.0000 mg | ORAL_CAPSULE | Freq: Four times a day (QID) | ORAL | Status: DC

## 2015-06-24 NOTE — ED Notes (Signed)
Pt c/o painful urination onset today. Pt denies odor. Reports some blood on tissue when she wiped.

## 2015-06-24 NOTE — Discharge Instructions (Signed)
Return to the ED with any concerns including vomiting and not able to keep down liquids or medications, fever/chills, abdominal pain, decreased level of alertness/lethargy, or any other alarming symptoms

## 2015-06-24 NOTE — ED Provider Notes (Signed)
CSN: [id number]     Arrival date & time 06/24/15  1344 History   First MD Initiated Contact with Patient 06/24/15 1836     Chief Complaint  Patient presents with  . Dysuria     (Consider location/radiation/quality/duration/timing/severity/associated sxs/prior Treatment) HPI  Pt presenting with c/o painful urination.  She states symptoms began this morning.  She has not had an increased in frequency.  No abdominal pain or vomiting.  No fever/chills.  She states symptoms feel similar to her prior UTIs.  No back pain.  She has not been on antibiotics in the past several months.  No treatment prior to arrival.  There are no other associated systemic symptoms, there are no other alleviating or modifying factors.   Past Medical History  Diagnosis Date  . Depression   . Gout   . Bipolar 1 disorder ([place])   . Schizoaffective disorder   . Chronic kidney disease   . Hypertension   . Seizure disorder ([place])   . Thrombocytopenia ([place])   . Anemia   . Normal coronary arteries   . Chronic diastolic CHF (congestive heart failure) ([place]) 10/10/2014    LVEDP elevated at time of cath   Past Surgical History  Procedure Laterality Date  . Tubal ligation    . Appendectomy    . Cardiac catheterization  2005    Normal  . Cardiac catheterization  11/09/2013    Normal  . Left heart catheterization with coronary angiogram N/A 11/09/2013    Procedure: LEFT HEART CATHETERIZATION WITH CORONARY ANGIOGRAM;  Surgeon: Leonie Man, MD;  Location: [facility] CATH LAB;  Service: Cardiovascular;  Laterality: N/A;   Family History  Problem Relation Age of Onset  . Heart attack Mother   . Heart disease Mother   . Heart attack Father   . Heart disease Father   . Hypertension Brother    Social History  Substance Use Topics  . Smoking status: Never Smoker   . Smokeless tobacco: Never Used  . Alcohol Use: No   OB History    No data available     Review of Systems  ROS reviewed and all otherwise negative except  for mentioned in HPI    Allergies  Ativan; Benztropine mesylate; Clonazepam; Vicodin; Lisinopril; and Sulfonamide derivatives  Home Medications   Prior to Admission medications   Medication Sig Start Date End Date Taking? Authorizing Provider  aspirin EC 81 MG tablet Take 1 tablet (81 mg total) by mouth daily. 04/26/15   [place] N Rumley, DO  azithromycin (ZITHROMAX) 250 MG tablet Please take two tablets today followed by one tablet daily for four days. 04/28/15   [place] N Rumley, DO  benzonatate (TESSALON) 100 MG capsule Take 1 capsule (100 mg total) by mouth 3 (three) times daily as needed for cough. 04/28/15   [place] N Rumley, DO  Calcium Citrate-Vitamin D (CALCIUM CITRATE + D3 PO) Take 1 tablet by mouth 2 (two) times daily.    Historical Provider, MD  cephALEXin (KEFLEX) 500 MG capsule Take 1 capsule (500 mg total) by mouth 4 (four) times daily. 06/24/15   Alfonzo Beers, MD  diclofenac sodium (VOLTAREN) 1 % GEL Apply 2 g topically 2 (two) times daily as needed (pain).    Historical Provider, MD  diltiazem (CARDIZEM) 60 MG tablet Take 1 tablet (60 mg total) by mouth 3 (three) times daily. 05/04/15   Sueanne Margarita, MD  divalproex (DEPAKOTE ER) 500 MG 24 hr tablet Take 1 tablet (500 mg total) by  mouth daily. 01/18/13   Hilton Sinclair, MD  Multiple Vitamins-Minerals (CENTRUM SILVER ADULT 50+ PO) Take 1 tablet by mouth daily.    Historical Provider, MD  nitroGLYCERIN (NITROSTAT) 0.4 MG SL tablet PLACE 1 TABLET UNDER THE THE TONGUE AS DIRECTED EVERY 5 MINUTES AS NEEDED FOR CHEST PAIN 01/18/15   Sueanne Margarita, MD  pantoprazole (PROTONIX) 40 MG tablet Take 1 tablet (40 mg total) by mouth daily. 09/20/14   [place] N Rumley, DO  polyethylene glycol (MIRALAX / GLYCOLAX) packet Take 17 g by mouth daily as needed for mild constipation. 01/26/15   [place] N Rumley, DO  Polyvinyl Alcohol-Povidone (REFRESH OP) Apply 1-2 drops to eye at bedtime as needed (dry eyes).    Historical Provider, MD   risperiDONE (RISPERDAL) 2 MG tablet Take 2 mg by mouth at bedtime.     Historical Provider, MD  vitamin C (ASCORBIC ACID) 500 MG tablet Take 500 mg by mouth daily.    Historical Provider, MD   BP 147/69 mmHg  Pulse 78  Temp(Src) 98.6 F (37 C) (Oral)  Resp 18  Ht 5\' 3"  (1.6 m)  Wt 65.772 kg  BMI 25.69 kg/m2  SpO2 100%  Vitals reviewed Physical Exam  Physical Examination: General appearance - alert, well appearing, and in no distress Mental status - alert, oriented to person, place, and time Eyes - no conjunctival injetion, no scleral icterus Chest - clear to auscultation, no wheezes, rales or rhonchi, symmetric air entry Heart - normal rate, regular rhythm, normal S1, S2, no murmurs, rubs, clicks or gallops Abdomen - soft, nontender, nondistended, no masses or organomegaly Neurological - alert, oriented, normal speech Extremities - peripheral pulses normal, no pedal edema, no clubbing or cyanosis Skin - normal coloration and turgor, no rashes  ED Course  Procedures (including critical care time) Labs Review Labs Reviewed  URINALYSIS, ROUTINE W REFLEX MICROSCOPIC (NOT AT [facility]) - Abnormal; Notable for the following:    Color, Urine COLORLESS (*)    APPearance CLOUDY (*)    Hgb urine dipstick LARGE (*)    Leukocytes, UA LARGE (*)    All other components within normal limits  URINE MICROSCOPIC-ADD ON - Abnormal; Notable for the following:    Squamous Epithelial / LPF 0-5 (*)    Bacteria, UA FEW (*)    All other components within normal limits  CBC - Abnormal; Notable for the following:    Hemoglobin 11.3 (*)    HCT 33.5 (*)    RDW 16.6 (*)    All other components within normal limits  BASIC METABOLIC PANEL - Abnormal; Notable for the following:    Glucose, Bld 128 (*)    Creatinine, Ser 1.14 (*)    GFR calc non Af Amer 46 (*)    GFR calc Af Amer 53 (*)    All other components within normal limits  URINE CULTURE    Imaging Review No results found. I have personally  reviewed and evaluated these images and lab results as part of my medical decision-making.   EKG Interpretation None      MDM   Final diagnoses:  UTI (lower urinary tract infection)    Pt presenting with c/o dysuria.  No abdominal pain or tenderness.  No fever or vomiting.  Pt appears well hydrated and nontoxic.  Urine culture sent.  No labs abnormalitilies.  Pt started on keflex.  Discharged with strict return precautions.  Pt agreeable with plan.    Alfonzo Beers, MD 06/24/15 2138

## 2015-06-24 NOTE — ED Notes (Signed)
Called pt to be triaged, no response.  

## 2015-06-26 LAB — URINE CULTURE: Culture: 30000

## 2015-06-27 ENCOUNTER — Telehealth (HOSPITAL_BASED_OUTPATIENT_CLINIC_OR_DEPARTMENT_OTHER): Payer: Self-pay | Admitting: Emergency Medicine

## 2015-06-27 NOTE — Telephone Encounter (Signed)
Post ED Visit - Positive Culture Follow-up  Culture report reviewed by antimicrobial stewardship pharmacist:  []  Elenor Quinones, Pharm.D. []  Heide Guile, Pharm.D., BCPS []  Parks Neptune, Pharm.D. []  Alycia Rossetti, Pharm.D., BCPS []  [place], Pharm.D., BCPS, AAHIVP []  Legrand Como, Pharm.D., BCPS, AAHIVP []  Milus Glazier, Pharm.D. []  Stephens November, [place].DGovernor Specking PharmD  Positive urine culture Strep Treated with cephalexin, organism sensitive to the same and no further patient follow-up is required at this time.  Hazle Nordmann 06/27/2015, 10:29 AM

## 2015-06-29 ENCOUNTER — Ambulatory Visit (INDEPENDENT_AMBULATORY_CARE_PROVIDER_SITE_OTHER): Payer: Medicare Other | Admitting: Family Medicine

## 2015-06-29 ENCOUNTER — Encounter: Payer: Self-pay | Admitting: Family Medicine

## 2015-06-29 VITALS — BP 148/58 | HR 87 | Temp 98.1°F | Wt 144.3 lb

## 2015-06-29 DIAGNOSIS — R05 Cough: Secondary | ICD-10-CM

## 2015-06-29 DIAGNOSIS — R059 Cough, unspecified: Secondary | ICD-10-CM

## 2015-06-29 NOTE — Progress Notes (Signed)
   Subjective:    Patient ID: [name], female    DOB: 09/01/1940, 75 y.o.   MRN: [id number]  HPI  Patient presents for Same Day Appointment  CC: cough  # Cough:  Started 2 days ago  Cough yellow phlegm with some red blood streaks/specks  Also having clear nasal discharge, runny nose  No fever  No shortness of breath  No wheezing  Took some coricidin HBP which seemed to help  Also went to ER on Saturday, found to have UTI and currently on keflex. Says a lot of people were coughing in the waiting room that she was exposed to ROS: no vomiting, slight diarrhea/loose stool, no muscle aches, no chest pains  Social Hx: never smoker  Review of Systems   See HPI for ROS.   Past medical history, surgical, family, and social history reviewed and updated in the EMR as appropriate.  Objective:  BP 148/58 mmHg  Pulse 87  Temp(Src) 98.1 F (36.7 C) (Oral)  Wt 144 lb 4.8 oz (65.454 kg) Vitals and nursing note reviewed  General: no apparent distress CV: normal rate, regular rhythm, 2/6 systolic murmur present, 2+ radial pulses bilaterally  Resp: clear to auscultation bilaterally, normal effort. No cough appreciated in the room.  Assessment & Plan:   1. Cough Likely viral. Lung exam normal. Discussed OTC cough/cold medicines. She is currently on keflex for a UTI, so instructed to finish out this antibiotic. Discussed usual time course for cough, concerning if develops worsening hemoptysis that is more than just little tiny streaks or specks, worsening breathing. Follow up as needed.

## 2015-07-05 ENCOUNTER — Other Ambulatory Visit: Payer: Self-pay | Admitting: Cardiology

## 2015-07-06 ENCOUNTER — Encounter: Payer: Self-pay | Admitting: Family Medicine

## 2015-07-06 ENCOUNTER — Ambulatory Visit (INDEPENDENT_AMBULATORY_CARE_PROVIDER_SITE_OTHER): Payer: Medicare Other | Admitting: Family Medicine

## 2015-07-06 VITALS — BP 140/59 | HR 75 | Temp 97.8°F | Ht 63.0 in | Wt 142.0 lb

## 2015-07-06 DIAGNOSIS — R319 Hematuria, unspecified: Secondary | ICD-10-CM | POA: Diagnosis not present

## 2015-07-06 DIAGNOSIS — N390 Urinary tract infection, site not specified: Secondary | ICD-10-CM

## 2015-07-06 DIAGNOSIS — J069 Acute upper respiratory infection, unspecified: Secondary | ICD-10-CM

## 2015-07-06 LAB — POCT URINALYSIS DIPSTICK
Bilirubin, UA: NEGATIVE
Blood, UA: NEGATIVE
Glucose, UA: NEGATIVE
Ketones, UA: NEGATIVE
LEUKOCYTES UA: NEGATIVE
Nitrite, UA: NEGATIVE
PH UA: 6.5
PROTEIN UA: NEGATIVE
UROBILINOGEN UA: 0.2

## 2015-07-06 MED ORDER — BENZONATATE 100 MG PO CAPS: 100.0000 mg | ORAL_CAPSULE | Freq: Three times a day (TID) | ORAL | Status: DC | PRN

## 2015-07-06 NOTE — Patient Instructions (Addendum)
Thank you so much for coming to visit today! - Your cough should continue to improve. You may use honey for cough. I have sent in Tessalon to help. - Your urinalysis was normal. We will continue to monitor your symptoms and treat if infection presents again.  Thanks again! Dr. Gerlean Ren

## 2015-07-07 NOTE — Progress Notes (Signed)
Subjective:     Patient ID: Renee Rival, female   DOB: 11-10-1940, 75 y.o.   MRN: [id number]  HPI Mrs. Flick is a 75yo female presenting today with cough and follow up of previous UTI.  # Cough: - Has been present since 3/19 (Note at office visit on 3/16, she reported 2 day history of cough) - Has been gradually improving - Coughing up dark yellow sputum with very rare speck of blood - Sneezing - Denies nausea, vomiting, diarrhea, fever, shortness of breath - Has been using coricidin, which has been helping  # UTI: - Recently seen in ED on 3/11 and diagnosed with UTI. Urinalysis cloudy, large hemoglobin, negative nitrite, few bacteria, too numerous to count WBC.  Given prescription for Keflex. Urine culture returned with 30,000 colonies/mL of GBS (S. Agalactiae). - Completed course of antibiotics - Symptoms significantly improved. Notes occasional dysuria, once daily. Denies increase frequency or urgency. - Has been drinking plenty of water, 16.9oz five times daily - Denies fever, abdominal pain.  Review of Systems Per HPI. Other systems negative.    Objective:   Physical Exam  Constitutional: She appears well-developed and well-nourished. No distress.  Cardiovascular: Normal rate and regular rhythm.  Exam reveals no gallop and no friction rub.   Murmur heard. Pulmonary/Chest: Effort normal. No respiratory distress. She has no wheezes.  Abdominal: Soft. She exhibits no distension.  Mild suprapubic tenderness, significantly improved. No CVA tenderness noted.  Musculoskeletal: She exhibits no edema.  Skin: No rash noted.  Psychiatric: She has a normal mood and affect. Her behavior is normal.      Assessment and Plan:     URI (upper respiratory infection) - Improving - Continue symptomatic management. Tessalon prescribed. - Follow up if symptoms worsen or fail to improve  UTI (urinary tract infection) - Urinalysis today without signs of infection  - No further treatment  needed - Follow up if symptoms return

## 2015-07-07 NOTE — Assessment & Plan Note (Signed)
-   Improving - Continue symptomatic management. Tessalon prescribed. - Follow up if symptoms worsen or fail to improve

## 2015-07-07 NOTE — Assessment & Plan Note (Signed)
-   Urinalysis today without signs of infection  - No further treatment needed - Follow up if symptoms return

## 2015-07-12 DIAGNOSIS — F319 Bipolar disorder, unspecified: Secondary | ICD-10-CM | POA: Diagnosis not present

## 2015-07-14 ENCOUNTER — Encounter: Payer: Self-pay | Admitting: Family Medicine

## 2015-07-14 ENCOUNTER — Telehealth: Payer: Self-pay | Admitting: Family Medicine

## 2015-07-14 ENCOUNTER — Ambulatory Visit (INDEPENDENT_AMBULATORY_CARE_PROVIDER_SITE_OTHER): Payer: Medicare Other | Admitting: Family Medicine

## 2015-07-14 VITALS — BP 158/59 | HR 59 | Temp 98.3°F | Ht 63.0 in | Wt 142.3 lb

## 2015-07-14 DIAGNOSIS — R319 Hematuria, unspecified: Secondary | ICD-10-CM | POA: Diagnosis not present

## 2015-07-14 DIAGNOSIS — R300 Dysuria: Secondary | ICD-10-CM | POA: Diagnosis not present

## 2015-07-14 LAB — POCT URINALYSIS DIPSTICK
BILIRUBIN UA: NEGATIVE
Glucose, UA: NEGATIVE
Ketones, UA: NEGATIVE
NITRITE UA: NEGATIVE
PH UA: 7
Protein, UA: NEGATIVE
Spec Grav, UA: <=1.005
UROBILINOGEN UA: 0.2

## 2015-07-14 LAB — POCT UA - MICROSCOPIC ONLY

## 2015-07-14 NOTE — Telephone Encounter (Signed)
Patient calls again.  °

## 2015-07-14 NOTE — Patient Instructions (Signed)
Thank you for coming in,   I will call or send a letter with the results from today  Please bring all of your medications with you to each visit.   Sign up for My Chart to have easy access to your labs results, and communication with your Primary care physician   Please feel free to call with any questions or concerns at any time, at [phone]. --Dr. Brigido Mera  

## 2015-07-14 NOTE — Progress Notes (Signed)
   Subjective:    Patient ID: [name], female    DOB: Apr 20, 1940, 75 y.o.   MRN: [id number]  Seen for Same day visit for   CC: Hematuria  She was having blood pass when she was urinating this morning She has pain with urination after she wipes  This feels different than in prior UTI's  Previously she didn't pass blood like she did today.  No ever tobacco use  Denies any fevers or chills  Reports a history of two UTI's in the past 6 months.  Wears pull ups to protect from leakage.  Urine culture has grown 30,000 COLONIES/mL GROUP B STREP(S.AGALACTIAE) in 2016 and 100,000 colonies/mL E coli in 2014  She had two children and born vaginally.  Denies any blood in her vagina.  Has constipation and takes miralax.  Reports her constipation has been well controlled.  CT ab/pelvis in 2013 showing diverticulum   SH: no tobacco  PMH: dCHF, HTN, HLD.   Review of Systems   See HPI for ROS. Objective:  BP 158/59 mmHg  Pulse 59  Temp(Src) 98.3 F (36.8 C) (Oral)  Ht 5\' 3"  (1.6 m)  Wt 142 lb 4.8 oz (64.547 kg)  BMI 25.21 kg/m2  General: NAD Cardiac: RRR, normal heart sounds, no murmurs.  Respiratory: CTAB, normal effort Abdomen: soft, nontender, nondistended, no hepatic or splenomegaly. Bowel sounds present, mild suprapubic tenderness  Extremities: WWP. Skin: warm and dry, no rashes noted Neuro: alert and oriented, no focal deficits     Assessment & Plan:   Hematuria Having gross blood while voiding  Possible to be related to infection  No chronic anticoagulation and only on aspirin  Possible for cancer but this is new symptom  - will obtain UA and urine culture  - if showing infection will treat - had the conversation that she would benefit from seeing a urologist in the future

## 2015-07-14 NOTE — Telephone Encounter (Signed)
Pt called and would like to speak to Dr. Gerlean Ren or one of her nurses about passing blood after she urinates and also has some pain. jw

## 2015-07-14 NOTE — Telephone Encounter (Signed)
Return call to patient regarding bleeding with urination.  Advised patient she should be seen.  Patient did state, it only happened once, but agreed to appointment.  Appointment today at 2:30 PM.  Derl Barrow, RN

## 2015-07-15 DIAGNOSIS — R319 Hematuria, unspecified: Secondary | ICD-10-CM | POA: Insufficient documentation

## 2015-07-15 NOTE — Assessment & Plan Note (Signed)
Having gross blood while voiding  Possible to be related to infection  No chronic anticoagulation and only on aspirin  Possible for cancer but this is new symptom  - will obtain UA and urine culture  - if showing infection will treat - had the conversation that [name] would benefit from seeing a urologist in the future

## 2015-07-17 ENCOUNTER — Telehealth: Payer: Self-pay | Admitting: Family Medicine

## 2015-07-17 MED ORDER — CEPHALEXIN 500 MG PO CAPS: 500.0000 mg | ORAL_CAPSULE | Freq: Three times a day (TID) | ORAL | Status: DC

## 2015-07-17 NOTE — Telephone Encounter (Signed)
Left VM for patient. If she calls back please have her speak with a nurse/CMA and inform that her urine showed some bacteria and we didn't get a culture. I will send in an antibiotic. If the blood in her urine doesn't get better with the antibiotic then she needs to have a discussion with her primary doctor about referral to urology.   If any questions then please take the best time and phone number to call and I will try to call her back.   Rosemarie Ax, MD PGY-3, [facility] 07/17/2015, 1:29 PM

## 2015-07-17 NOTE — Telephone Encounter (Signed)
Patient informed. 

## 2015-09-05 ENCOUNTER — Other Ambulatory Visit: Payer: Self-pay | Admitting: *Deleted

## 2015-09-05 MED ORDER — PANTOPRAZOLE SODIUM 40 MG PO TBEC: 40.0000 mg | DELAYED_RELEASE_TABLET | Freq: Every day | ORAL | Status: DC

## 2015-09-21 ENCOUNTER — Ambulatory Visit (INDEPENDENT_AMBULATORY_CARE_PROVIDER_SITE_OTHER): Payer: Medicare Other | Admitting: Podiatry

## 2015-09-21 ENCOUNTER — Encounter: Payer: Self-pay | Admitting: Podiatry

## 2015-09-21 DIAGNOSIS — M79676 Pain in unspecified toe(s): Secondary | ICD-10-CM

## 2015-09-21 DIAGNOSIS — M79675 Pain in left toe(s): Secondary | ICD-10-CM | POA: Diagnosis not present

## 2015-09-21 DIAGNOSIS — B351 Tinea unguium: Secondary | ICD-10-CM

## 2015-09-21 NOTE — Progress Notes (Signed)
Patient ID: [name], female   DOB: 04-25-1940, 75 y.o.   MRN: [id number] I Visit Type: Patient returns to my office for continued preventative foot care services. Complaint: Patient states" my nails have grown long and thick and become painful to walk and wear shoes  . She presents for preventative foot care services. No changes to ROS  Podiatric Exam: Vascular: dorsalis pedis and posterior tibial pulses are palpable bilateral. Capillary return is immediate. Temperature gradient is WNL. Skin turgor WNL  Sensorium: Normal Semmes Weinstein monofilament test. Normal tactile sensation bilaterally. Nail Exam: Pt has thick disfigured discolored nails with subungual debris noted bilateral entire nail hallux through fifth toenails Ulcer Exam: There is no evidence of ulcer or pre-ulcerative changes or infection. Orthopedic Exam: Muscle tone and strength are WNL. No limitations in general ROM. No crepitus or effusions noted. Foot type and digits show no abnormalities. Bony prominences are unremarkable.  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 Skin: No Porokeratosis. No infection or ulcers.  Distal clavi second toe left foot asymptomatic  Diagnosis:  Tinea unguium, Pain in right toe, pain in left toes  Treatment & Plan Procedures and Treatment: Consent by patient was obtained for treatment procedures. The patient understood the discussion of treatment and procedures well. All questions were answered thoroughly reviewed. Debridement of mycotic and hypertrophic toenails, 1 through 5 bilateral and clearing of subungual debris. No ulceration, no infection noted.  Return Visit-Office Procedure: Patient instructed to return to the office for a follow up visit 3 months for continued evaluation and treatment.  Gardiner Barefoot DPM

## 2015-09-22 ENCOUNTER — Encounter: Payer: Self-pay | Admitting: Cardiology

## 2015-09-22 ENCOUNTER — Ambulatory Visit (INDEPENDENT_AMBULATORY_CARE_PROVIDER_SITE_OTHER): Payer: Medicare Other | Admitting: Cardiology

## 2015-09-22 VITALS — BP 130/66 | HR 67 | Ht 62.5 in | Wt 144.4 lb

## 2015-09-22 DIAGNOSIS — I10 Essential (primary) hypertension: Secondary | ICD-10-CM

## 2015-09-22 DIAGNOSIS — I5032 Chronic diastolic (congestive) heart failure: Secondary | ICD-10-CM

## 2015-09-22 NOTE — Progress Notes (Signed)
Cardiology Office Note    Date:  09/22/2015   ID:  [name], DOB 1940/08/13, MRN [id number]  PCP:  Junie Panning, DO  Cardiologist:  Fransico Him, MD   Chief Complaint  Patient presents with  . Congestive Heart Failure  . Hypertension    History of Present Illness:  [name] is a 75 y.o. female with a history of HTN, normal coronary arteries by cath 2015 and chronic diastolic CHF. She is doing well today. She denies any SOB, DOE, dizziness, LE edema, palpitations or syncope.  She walks with a rolling walker.  Past Medical History  Diagnosis Date  . Depression   . Gout   . Bipolar 1 disorder ([place])   . Schizoaffective disorder   . Chronic kidney disease   . Hypertension   . Seizure disorder ([place])   . Thrombocytopenia ([place])   . Anemia   . Normal coronary arteries   . Chronic diastolic CHF (congestive heart failure) ([place]) 10/10/2014    LVEDP elevated at time of cath    Past Surgical History  Procedure Laterality Date  . Tubal ligation    . Appendectomy    . Cardiac catheterization  2005    Normal  . Cardiac catheterization  11/09/2013    Normal  . Left heart catheterization with coronary angiogram N/A 11/09/2013    Procedure: LEFT HEART CATHETERIZATION WITH CORONARY ANGIOGRAM;  Surgeon: Leonie Man, MD;  Location: [facility] CATH LAB;  Service: Cardiovascular;  Laterality: N/A;    Current Medications: Outpatient Prescriptions Prior to Visit  Medication Sig Dispense Refill  . aspirin EC 81 MG tablet Take 1 tablet (81 mg total) by mouth daily. 90 tablet 4  . Calcium Citrate-Vitamin D (CALCIUM CITRATE + D3 PO) Take 1 tablet by mouth 2 (two) times daily.    . diclofenac sodium (VOLTAREN) 1 % GEL Apply 2 g topically 2 (two) times daily as needed (pain).    [name] diltiazem (CARDIZEM) 60 MG tablet TAKE 1 TABLET(60 MG) BY MOUTH THREE TIMES DAILY 270 tablet 0  . divalproex (DEPAKOTE ER) 500 MG 24 hr tablet Take 1 tablet (500 mg total) by mouth daily. 30 tablet 0  . Multiple  Vitamins-Minerals (CENTRUM SILVER ADULT 50+ PO) Take 1 tablet by mouth daily.    . nitroGLYCERIN (NITROSTAT) 0.4 MG SL tablet PLACE 1 TABLET UNDER THE THE TONGUE AS DIRECTED EVERY 5 MINUTES AS NEEDED FOR CHEST PAIN 25 tablet 3  . pantoprazole (PROTONIX) 40 MG tablet Take 1 tablet (40 mg total) by mouth daily. 30 tablet 3  . polyethylene glycol (MIRALAX / GLYCOLAX) packet Take 17 g by mouth daily as needed for mild constipation. 14 each 6  . Polyvinyl Alcohol-Povidone (REFRESH OP) Apply 1-2 drops to eye at bedtime as needed (dry eyes).    . risperiDONE (RISPERDAL) 2 MG tablet Take 2 mg by mouth at bedtime.     . vitamin C (ASCORBIC ACID) 500 MG tablet Take 500 mg by mouth daily.    . benzonatate (TESSALON) 100 MG capsule Take 1 capsule (100 mg total) by mouth 3 (three) times daily as needed for cough. 20 capsule 0  . cephALEXin (KEFLEX) 500 MG capsule Take 1 capsule (500 mg total) by mouth 3 (three) times daily. For a total of 7 days. 21 capsule 0  . diltiazem (CARDIZEM) 60 MG tablet Take 1 tablet (60 mg total) by mouth 3 (three) times daily. 270 tablet 1   No facility-administered medications prior to visit.  Allergies:   Ativan; Benztropine mesylate; Clonazepam; Vicodin; Lisinopril; and Sulfonamide derivatives   Social History   Social History  . Marital Status: Divorced    Spouse Name: N/A  . Number of Children: N/A  . Years of Education: N/A   Occupational History  . Retired-retail sales     [name]   Social History Main Topics  . Smoking status: Never Smoker   . Smokeless tobacco: Never Used  . Alcohol Use: No  . Drug Use: No  . Sexual Activity: Not Currently   Other Topics Concern  . None   Social History Narrative   Health Care POA: [name], daughter   Emergency Contact: [name] [phone]   End of Life Plan: DNR   Live by her self.   Any pets: none   Diet: Patient has a varied diet of protein, starch, and vegetables.   Exercise: Patient walks and uses  stationary bike.   Hobbies: Reading the Bible      Lives in [place], uses medicaid transportation to get to appointments.   Is a Company secretary at [employer].                    Family History:  The patient's family history includes Heart attack in her father and mother; Heart disease in her father and mother; Hypertension in her brother.   ROS:   Please see the history of present illness.    ROS All other systems reviewed and are negative.   PHYSICAL EXAM:   VS:  BP 130/66 mmHg  Pulse 67  Ht 5' 2.5" (1.588 m)  Wt 144 lb 6.4 oz (65.499 kg)  BMI 25.97 kg/m2  SpO2 99%   GEN: Well nourished, well developed, in no acute distress HEENT: normal Neck: no JVD, carotid bruits, or masses Cardiac: RRR; no murmurs, rubs, or gallops,no edema.  Intact distal pulses bilaterally.  Respiratory:  clear to auscultation bilaterally, normal work of breathing GI: soft, nontender, nondistended, + BS MS: no deformity or atrophy Skin: warm and dry, no rash Neuro:  Alert and Oriented x 3, Strength and sensation are intact Psych: euthymic mood, full affect  Wt Readings from Last 3 Encounters:  09/22/15 144 lb 6.4 oz (65.499 kg)  07/14/15 142 lb 4.8 oz (64.547 kg)  07/06/15 142 lb (64.411 kg)      Studies/Labs Reviewed:   EKG:  EKG is not ordered today.  Recent Labs: 10/10/2014: Pro B Natriuretic peptide (BNP) 35.0 02/03/2015: B Natriuretic Peptide 21.1; Magnesium 1.9; TSH 3.574 02/10/2015: ALT 13 06/24/2015: BUN 15; Creatinine, Ser 1.14*; Hemoglobin 11.3*; Platelets 150; Potassium 4.1; Sodium 143   Lipid Panel    Component Value Date/Time   CHOL 205* 02/03/2015 1837   TRIG 96 02/03/2015 1837   HDL 71 02/03/2015 1837   CHOLHDL 2.9 02/03/2015 1837   VLDL 19 02/03/2015 1837   LDLCALC 115* 02/03/2015 1837   LDLDIRECT 114* 12/10/2006 2054    Additional studies/ records that were reviewed today include:  none    ASSESSMENT:    1. Chronic diastolic CHF (congestive heart failure)  ([place])   2. HYPERTENSION, BENIGN SYSTEMIC      PLAN:  In order of problems listed above:  1. Chronic diastolic CHF - she appears euvolemic on exam with no edema.  She will continue on CCB.  She is not on a diuretic. 2. HTN - BP well controlled on Cardizem which she will continue.     Medication Adjustments/Labs and  Tests Ordered: Current medicines are reviewed at length with the patient today.  Concerns regarding medicines are outlined above.  Medication changes, Labs and Tests ordered today are listed in the Patient Instructions below.  There are no Patient Instructions on file for this visit.   Signed, Fransico Him, MD  09/22/2015 10:27 AM    [address], [place], [place]  [address] Phone: [phone]; Fax: [phone]

## 2015-09-22 NOTE — Patient Instructions (Signed)

## 2015-10-10 DIAGNOSIS — F319 Bipolar disorder, unspecified: Secondary | ICD-10-CM | POA: Diagnosis not present

## 2015-12-21 ENCOUNTER — Ambulatory Visit (INDEPENDENT_AMBULATORY_CARE_PROVIDER_SITE_OTHER): Payer: Medicare Other | Admitting: Podiatry

## 2015-12-21 ENCOUNTER — Encounter: Payer: Self-pay | Admitting: Podiatry

## 2015-12-21 DIAGNOSIS — B351 Tinea unguium: Secondary | ICD-10-CM | POA: Diagnosis not present

## 2015-12-21 DIAGNOSIS — M79676 Pain in unspecified toe(s): Secondary | ICD-10-CM | POA: Diagnosis not present

## 2015-12-21 DIAGNOSIS — M79675 Pain in left toe(s): Secondary | ICD-10-CM

## 2015-12-21 NOTE — Progress Notes (Signed)
Patient ID: [name], female   DOB: April 21, 1940, 75 y.o.   MRN: [id number] I Visit Type: Patient returns to my office for continued preventative foot care services. Complaint: Patient states" my nails have grown long and thick and become painful to walk and wear shoes  . She presents for preventative foot care services. No changes to ROS  Podiatric Exam: Vascular: dorsalis pedis and posterior tibial pulses are palpable bilateral. Capillary return is immediate. Temperature gradient is WNL. Skin turgor WNL  Sensorium: Normal Semmes Weinstein monofilament test. Normal tactile sensation bilaterally. Nail Exam: Pt has thick disfigured discolored nails with subungual debris noted bilateral entire nail hallux through fifth toenails Ulcer Exam: There is no evidence of ulcer or pre-ulcerative changes or infection. Orthopedic Exam: Muscle tone and strength are WNL. No limitations in general ROM. No crepitus or effusions noted. Foot type and digits show no abnormalities. Bony prominences are unremarkable.  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 Skin: No Porokeratosis. No infection or ulcers.  Distal clavi second toe left foot asymptomatic  Diagnosis:  Tinea unguium, Pain in right toe, pain in left toes  Treatment & Plan Procedures and Treatment: Consent by patient was obtained for treatment procedures. The patient understood the discussion of treatment and procedures well. All questions were answered thoroughly reviewed. Debridement of mycotic and hypertrophic toenails, 1 through 5 bilateral and clearing of subungual debris. No ulceration, no infection noted.  Return Visit-Office Procedure: Patient instructed to return to the office for a follow up visit 3 months for continued evaluation and treatment.  Gardiner Barefoot DPM

## 2015-12-25 ENCOUNTER — Other Ambulatory Visit: Payer: Self-pay | Admitting: Cardiology

## 2016-01-16 ENCOUNTER — Other Ambulatory Visit: Payer: Self-pay | Admitting: Family Medicine

## 2016-02-24 ENCOUNTER — Telehealth: Payer: Self-pay | Admitting: Internal Medicine

## 2016-02-24 MED ORDER — CEPHALEXIN 500 MG PO CAPS: 500.0000 mg | ORAL_CAPSULE | Freq: Four times a day (QID) | ORAL | 0 refills | Status: DC

## 2016-02-24 NOTE — Telephone Encounter (Signed)
**  After Hours/ Emergency Line Call*  Received a call to report that [name] has been having dysuria for the last 2 days. She is also having a small amount of hematuria at the end of the urinary stream. This often happens to her when she has urinary tract infections. Per chart review, she has been seen in the past for UTIs with hematuria. She is usually prescribed Keflex and then the hematuria resolves. Pt states she cannot be seen at the ED or urgent care because she cannot afford it. Given that Pt is endorsing dysuria, will call in a prescription for Keflex 500mg  qid x 7 days. We discussed reasons to be seen urgently in the ED, including fevers, chills, vomiting, or flank pain. We also discussed that if her dysuria and hematuria do not resolve with the antibiotics, she will need to be seen in our clinic next week. Pt voiced understanding.  Will forward to PCP.  Evette Doffing, MD PGY-2, [facility]

## 2016-02-27 ENCOUNTER — Other Ambulatory Visit: Payer: Self-pay | Admitting: *Deleted

## 2016-02-28 MED ORDER — PANTOPRAZOLE SODIUM 40 MG PO TBEC: 40.0000 mg | DELAYED_RELEASE_TABLET | Freq: Every day | ORAL | 3 refills | Status: DC

## 2016-03-02 ENCOUNTER — Emergency Department (HOSPITAL_COMMUNITY)
Admission: EM | Admit: 2016-03-02 | Discharge: 2016-03-02 | Disposition: A | Discharge status: Home / Self Care | Payer: Medicare Other | Attending: Emergency Medicine | Admitting: Emergency Medicine

## 2016-03-02 ENCOUNTER — Encounter (HOSPITAL_COMMUNITY): Payer: Self-pay

## 2016-03-02 ENCOUNTER — Emergency Department (HOSPITAL_COMMUNITY): Payer: Medicare Other

## 2016-03-02 DIAGNOSIS — Z7982 Long term (current) use of aspirin: Secondary | ICD-10-CM | POA: Insufficient documentation

## 2016-03-02 DIAGNOSIS — R55 Syncope and collapse: Secondary | ICD-10-CM | POA: Diagnosis not present

## 2016-03-02 DIAGNOSIS — R079 Chest pain, unspecified: Secondary | ICD-10-CM | POA: Diagnosis not present

## 2016-03-02 DIAGNOSIS — N183 Chronic kidney disease, stage 3 (moderate): Secondary | ICD-10-CM | POA: Insufficient documentation

## 2016-03-02 DIAGNOSIS — I130 Hypertensive heart and chronic kidney disease with heart failure and stage 1 through stage 4 chronic kidney disease, or unspecified chronic kidney disease: Secondary | ICD-10-CM | POA: Diagnosis not present

## 2016-03-02 DIAGNOSIS — I5032 Chronic diastolic (congestive) heart failure: Secondary | ICD-10-CM | POA: Diagnosis not present

## 2016-03-02 DIAGNOSIS — R404 Transient alteration of awareness: Secondary | ICD-10-CM | POA: Diagnosis not present

## 2016-03-02 LAB — BASIC METABOLIC PANEL
Anion gap: 9 (ref 5–15)
BUN: 15 mg/dL (ref 6–20)
CO2: 25 mmol/L (ref 22–32)
Calcium: 10.6 mg/dL — ABNORMAL HIGH (ref 8.9–10.3)
Chloride: 105 mmol/L (ref 101–111)
Creatinine, Ser: 1.23 mg/dL — ABNORMAL HIGH (ref 0.44–1.00)
GFR calc Af Amer: 48 mL/min — ABNORMAL LOW (ref >60)
GFR calc non Af Amer: 42 mL/min — ABNORMAL LOW (ref >60)
Glucose, Bld: 98 mg/dL (ref 65–99)
Potassium: 4.4 mmol/L (ref 3.5–5.1)
Sodium: 139 mmol/L (ref 135–145)

## 2016-03-02 LAB — CBC
HCT: 37 % (ref 36.0–46.0)
Hemoglobin: 12.2 g/dL (ref 12.0–15.0)
MCH: 26.5 pg (ref 26.0–34.0)
MCHC: 33 g/dL (ref 30.0–36.0)
MCV: 80.4 fL (ref 78.0–100.0)
Platelets: 177 10*3/uL (ref 150–400)
RBC: 4.6 MIL/uL (ref 3.87–5.11)
RDW: 16.1 % — ABNORMAL HIGH (ref 11.5–15.5)
WBC: 6.8 10*3/uL (ref 4.0–10.5)

## 2016-03-02 LAB — I-STAT TROPONIN, ED: Troponin i, poc: 0.01 ng/mL (ref 0.00–0.08)

## 2016-03-02 NOTE — ED Notes (Signed)
Assisted patient to the bathroom; pt ambulatory with assistance only from her cane; patient ambulated from her room to bathroom and back without any difficulty or distress; patient was able to provide an urine specimen and its at bedside

## 2016-03-02 NOTE — ED Triage Notes (Signed)
Patient arrived by [facility] following an episode of CP this am in which she took 2 SL ntg and 4 baby asa. Patient then had syncopal episode in bathroom catching herself with her walker. On arrival denies any pain, no injury from syncopal event and no further CP. Alert and oriented, NAD

## 2016-03-02 NOTE — ED Provider Notes (Signed)
[facility] DEPT Provider Note   CSN: [id number] Arrival date & time: 03/02/16  1242  By signing my name below, I, Reola Mosher, attest that this documentation has been prepared under the direction and in the presence of Virgel Manifold, MD. Electronically Signed: Reola Mosher, ED Scribe. 03/02/16. 1:46 PM.  History   Chief Complaint Chief Complaint  Patient presents with  . Chest Pain   The history is provided by the patient. No language interpreter was used.   HPI Comments: [name] is a 75 y.o. female BIB [facility], with a PMHx of CHF, and prior cardiac catheterization, who presents to the Emergency Department complaining of sudden onset, resolved episode of sharp chest pain which occurred approximately four hours ago. No pain while in the ED. She reports associated sensation of palpitations secondary to the onset of her pain and notes that she is only has subjective bilateral feet numbness while in the ED. Pt states that just prior to the onset of her pain that she was eating breakfast, and that her pain presented while she was ambulating to her bathroom. Pt notes that directly after the episode she went into her bathroom to have a bowel movement, and sustained a brief syncopal episode while on her toilet. She did not fall to the floor, and reports that when she woke back up she was still sitting upright. After this episode, she got back up and began to ambulate toward her kitchen again when she sustained a mechanical, ground-level fall over her walker. No LOC head injury, or other significant injury during this event. She did not fall completely to the floor with this episode. Pt took four 81mg  Asprin and two NTG when her pain began with complete relief. Denies leg swelling, shortness of breath, diaphoresis, or any other associated symptoms.   Past Medical History:  Diagnosis Date  . Anemia   . Bipolar 1 disorder ([place])   . Chronic diastolic CHF (congestive heart  failure) ([place]) 10/10/2014   LVEDP elevated at time of cath  . Chronic kidney disease   . Depression   . Gout   . Hypertension   . Normal coronary arteries   . Schizoaffective disorder   . Seizure disorder ([place])   . Thrombocytopenia [facility])    Patient Active Problem List   Diagnosis Date Noted  . Hematuria 07/15/2015  . URI (upper respiratory infection) 04/30/2015  . Hyperglycemia 02/19/2015  . Pre-syncope 02/04/2015  . Syncope 02/03/2015  . Chronic diastolic CHF (congestive heart failure) ([place]) 10/10/2014  . Palpitations 10/10/2014  . UTI (urinary tract infection) 02/22/2014  . Chest pain, atypical 12/30/2013  . GERD (gastroesophageal reflux disease) 12/30/2013  . Normal coronary arteries 11/09/13 11/10/2013  . Localized swelling of lower leg (foot) 10/07/2013  . Abnormality of gait 06/12/2010  . SCHIZOAFFECTIVE DISORDER 11/28/2009  . KNEE PAIN, RIGHT, CHRONIC 10/25/2009  . ANEMIA, NORMOCYTIC, CHRONIC 11/07/2008  . GOUT 05/25/2008  . CHRONIC KIDNEY DISEASE STAGE III (MODERATE) 08/27/2007  . HYPERCHOLESTEROLEMIA 06/12/2006  . BIPOLAR DISORDER 06/12/2006  . HYPERTENSION, BENIGN SYSTEMIC 06/12/2006  . Convulsions/seizures ([place]) 06/12/2006   Past Surgical History:  Procedure Laterality Date  . APPENDECTOMY    . CARDIAC CATHETERIZATION  2005   Normal  . CARDIAC CATHETERIZATION  11/09/2013   Normal  . LEFT HEART CATHETERIZATION WITH CORONARY ANGIOGRAM N/A 11/09/2013   Procedure: LEFT HEART CATHETERIZATION WITH CORONARY ANGIOGRAM;  Surgeon: Leonie Man, MD;  Location: [facility] CATH LAB;  Service: Cardiovascular;  Laterality: N/A;  .  TUBAL LIGATION     OB History    No data available     Home Medications    Prior to Admission medications   Medication Sig Start Date End Date Taking? Authorizing Provider  Alum & Mag Hydroxide-Simeth (COMFORT GEL PO) Take 15 mLs by mouth as needed (for stomach).    Yes Historical Provider, MD  aspirin EC 81 MG tablet Take 1 tablet (81 mg total)  by mouth daily. 04/26/15   [place] N Rumley, DO  Calcium Citrate-Vitamin D (CALCIUM CITRATE + D3 PO) Take 1 tablet by mouth 2 (two) times daily.    Historical Provider, MD  cephALEXin (KEFLEX) 500 MG capsule Take 1 capsule (500 mg total) by mouth 4 (four) times daily. 02/24/16   Sela Hua, MD  diclofenac sodium (VOLTAREN) 1 % GEL Apply 2 g topically 2 (two) times daily as needed (pain).    Historical Provider, MD  diltiazem (CARDIZEM) 60 MG tablet TAKE 1 TABLET(60 MG) BY MOUTH THREE TIMES DAILY 07/05/15   Sueanne Margarita, MD  diltiazem (CARDIZEM) 60 MG tablet TAKE 1 TABLET(60 MG) BY MOUTH THREE TIMES DAILY 12/25/15   Sueanne Margarita, MD  divalproex (DEPAKOTE ER) 500 MG 24 hr tablet Take 1 tablet (500 mg total) by mouth daily. 01/18/13   Hilton Sinclair, MD  Multiple Vitamins-Minerals (CENTRUM SILVER ADULT 50+ PO) Take 1 tablet by mouth daily.    Historical Provider, MD  nitroGLYCERIN (NITROSTAT) 0.4 MG SL tablet PLACE 1 TABLET UNDER THE THE TONGUE AS DIRECTED EVERY 5 MINUTES AS NEEDED FOR CHEST PAIN 01/18/15   Sueanne Margarita, MD  pantoprazole (PROTONIX) 40 MG tablet Take 1 tablet (40 mg total) by mouth daily. 02/28/16   [place] N Rumley, DO  polyethylene glycol (MIRALAX / GLYCOLAX) packet Take 17 g by mouth daily as needed for mild constipation. 01/26/15   [place] N Rumley, DO  polyethylene glycol powder (GLYCOLAX/MIRALAX) powder TAKE 17 GRAMS MIXED IN FLUID BY MOUTH DAILY AS NEEDED FOR MILD CONSTIPATION 01/17/16   [place] N Rumley, DO  Polyvinyl Alcohol-Povidone (REFRESH OP) Apply 1-2 drops to eye at bedtime as needed (dry eyes).    Historical Provider, MD  risperiDONE (RISPERDAL) 2 MG tablet Take 2 mg by mouth at bedtime.     Historical Provider, MD  vitamin C (ASCORBIC ACID) 500 MG tablet Take 500 mg by mouth daily.    Historical Provider, MD   Family History Family History  Problem Relation Age of Onset  . Heart attack Mother   . Heart disease Mother   . Heart attack Father   . Heart  disease Father   . Hypertension Brother    Social History Social History  Substance Use Topics  . Smoking status: Never Smoker  . Smokeless tobacco: Never Used  . Alcohol use No   Allergies   Ativan [lorazepam]; Benztropine mesylate; Clonazepam; Vicodin [hydrocodone-acetaminophen]; Lisinopril; and Sulfonamide derivatives  Review of Systems Review of Systems  Constitutional: Negative for diaphoresis.  Respiratory: Negative for shortness of breath.   Cardiovascular: Positive for chest pain and palpitations. Negative for leg swelling.  Neurological: Positive for numbness (bilateral feet). Negative for syncope.  All other systems reviewed and are negative.  Physical Exam Updated Vital Signs BP 139/61 (BP Location: Right Arm)   Pulse 73   Temp 98.7 F (37.1 C) (Oral)   Resp 18   Ht 5\' 2"  (1.575 m)   Wt 143 lb (64.9 kg)   SpO2 97%   BMI 26.16 kg/m  Physical Exam  Constitutional: She appears well-developed and well-nourished.  HENT:  Head: Normocephalic and atraumatic.  Right Ear: External ear normal.  Left Ear: External ear normal.  Nose: Nose normal.  Mouth/Throat: Oropharynx is clear and moist.  Eyes: Conjunctivae are normal. Right eye exhibits no discharge. Left eye exhibits no discharge.  Neck: Normal range of motion.  Cardiovascular: Normal rate, regular rhythm and normal heart sounds.   No murmur heard. Pulmonary/Chest: Effort normal and breath sounds normal. No respiratory distress. She has no wheezes. She has no rales.  Abdominal: Soft. She exhibits no distension. There is no tenderness. There is no rebound and no guarding.  Musculoskeletal: Normal range of motion. She exhibits no edema or tenderness.  Neurological: She is alert. No cranial nerve deficit. Coordination normal.  Skin: Skin is warm and dry. No rash noted. No erythema.  Psychiatric: She has a normal mood and affect. Her behavior is normal.  Nursing note and vitals reviewed.  ED Treatments /  Results  DIAGNOSTIC STUDIES: Oxygen Saturation is 97% on RA, normal by my interpretation.   COORDINATION OF CARE: 1:10 PM-Discussed next steps with pt. Pt verbalized understanding and is agreeable with the plan.   Labs (all labs ordered are listed, but only abnormal results are displayed) Labs Reviewed  BASIC METABOLIC PANEL - Abnormal; Notable for the following:       Result Value   Creatinine, Ser 1.23 (*)    Calcium 10.6 (*)    GFR calc non Af Amer 42 (*)    GFR calc Af Amer 48 (*)    All other components within normal limits  CBC - Abnormal; Notable for the following:    RDW 16.1 (*)    All other components within normal limits  I-STAT TROPOININ, ED   EKG  EKG Interpretation  Date/Time:  Saturday March 02 2016 12:57:55 EST Ventricular Rate:  75 PR Interval:    QRS Duration: 85 QT Interval:  360 QTC Calculation: 402 R Axis:   9 Text Interpretation:  Sinus rhythm Borderline abnrm T, anterolateral leads No significant change since last tracing Confirmed by Wilson Singer  MD, Annie Main [id number]) on 03/02/2016 1:07:28 PM      Radiology No results found.   Dg Chest 2 View  Result Date: 03/02/2016 CLINICAL DATA:  Chest pain this morning followed by syncopal episode. EXAM: CHEST  2 VIEW COMPARISON:  02/11/2015 FINDINGS: The heart size and mediastinal contours are within normal limits. Aortic atherosclerosis. Both lungs are clear. The visualized skeletal structures are unremarkable. IMPRESSION: No active cardiopulmonary disease. Aortic atherosclerosis. Electronically Signed   By: Earle Gell M.D.   On: 03/02/2016 13:48   Procedures Procedures   Medications Ordered in ED Medications - No data to display  Initial Impression / Assessment and Plan / ED Course  I have reviewed the triage vital signs and the nursing notes.  Pertinent labs & imaging results that were available during my care of the patient were reviewed by me and considered in my medical decision making (see chart  for details).  Clinical Course     Final Clinical Impressions(s) / ED Diagnoses   Final diagnoses:  Chest pain, unspecified type   New Prescriptions New Prescriptions   No medications on file   I personally preformed the services scribed in my presence. The recorded information has been reviewed is accurate. Virgel Manifold, MD.    Virgel Manifold, MD 03/13/16 [id number]

## 2016-03-02 NOTE — ED Notes (Signed)
Pt discharged without complaint or concern. Placed in wheelchair and transported to lobby to await ride home.

## 2016-03-02 NOTE — ED Notes (Signed)
Patient undressed, in gown, on monitor, continuous pulse oximetry and blood pressure cuff 

## 2016-03-05 DIAGNOSIS — F319 Bipolar disorder, unspecified: Secondary | ICD-10-CM | POA: Diagnosis not present

## 2016-03-11 ENCOUNTER — Other Ambulatory Visit: Payer: Self-pay | Admitting: Family Medicine

## 2016-03-11 ENCOUNTER — Ambulatory Visit (INDEPENDENT_AMBULATORY_CARE_PROVIDER_SITE_OTHER): Payer: Medicare Other | Admitting: Family Medicine

## 2016-03-11 VITALS — BP 161/62 | HR 89 | Temp 98.9°F | Wt 146.4 lb

## 2016-03-11 DIAGNOSIS — N390 Urinary tract infection, site not specified: Secondary | ICD-10-CM

## 2016-03-11 DIAGNOSIS — R319 Hematuria, unspecified: Secondary | ICD-10-CM

## 2016-03-11 DIAGNOSIS — N3001 Acute cystitis with hematuria: Secondary | ICD-10-CM

## 2016-03-11 LAB — POCT URINALYSIS DIPSTICK
BILIRUBIN UA: NEGATIVE
GLUCOSE UA: NEGATIVE
KETONES UA: NEGATIVE
Nitrite, UA: NEGATIVE
PH UA: 7.5
Spec Grav, UA: 1.01
Urobilinogen, UA: 0.2

## 2016-03-11 LAB — POCT UA - MICROSCOPIC ONLY: WBC, Ur, HPF, POC: >20

## 2016-03-11 MED ORDER — CEPHALEXIN 500 MG PO CAPS: 500.0000 mg | ORAL_CAPSULE | Freq: Four times a day (QID) | ORAL | 0 refills | Status: DC

## 2016-03-11 NOTE — Progress Notes (Signed)
   Subjective: CC: dysuria [id number] [name] is a 75 y.o. female presenting to clinic today for same day appointment. PCP: Junie Panning, DO Concerns today include:  1. ?UTI Patient reports urinary frequency, dysuria that started 2 weeks ago.  She notes hematuria that started around 12pm today.  She reports suprapubic pain. Denies nausea, vomiting, fevers, chills.  She denies vaginal discharge or abnormal vaginal bleeding.  She has not used OTCs for symptoms.  This would be her 3rd UTI this year.  She would like to see a urologist.  Social History Reviewed: non smoker.  FamHx and MedHx reviewed.  Please see EMR. Health Maintenance: DEXA, TDap, PNA, Flu  ROS: Per HPI  Objective: Office vital signs reviewed. BP (!) 161/62   Pulse 89   Temp 98.9 F (37.2 C) (Oral)   Wt 146 lb 6.4 oz (66.4 kg)   SpO2 99%   BMI 26.78 kg/m   Physical Examination:  General: Awake, alert, well nourished, No acute distress Cardio: regular rate Pulm: normal WOB on room air GU: +suprapubic TTP, no CVA TTP  Results for orders placed or performed in visit on 03/11/16 (from the past 24 hour(s))  POCT UA - Microscopic Only     Status: None   Collection Time: 03/11/16  3:55 PM  Result Value Ref Range   WBC, Ur, HPF, POC >20    RBC, urine, microscopic >20    Bacteria, U Microscopic 1+    Epithelial cells, urine per micros 0-1    Crystals, Ur, HPF, POC none    Casts, Ur, LPF, POC none    Yeast, UA none   Urinalysis Dipstick     Status: Abnormal   Collection Time: 03/11/16  4:17 PM  Result Value Ref Range   Color, UA red    Clarity, UA cloudy    Glucose, UA neg    Bilirubin, UA neg    Ketones, UA neg    Spec Grav, UA 1.010    Blood, UA large    pH, UA 7.5    Protein, UA 100mg     Urobilinogen, UA 0.2    Nitrite, UA neg    Leukocytes, UA large (3+) (A) Negative   Narrative   Reflex to microscopic    Assessment/ Plan: 75 y.o. female   1. Acute cystitis with hematuria.  Large leuks and  large blood on >20 RBCs.  May be a result of UTI vs other pathologic process.  Given recurrence and persistent hematuria, will refer to urology.  No evidence of pyelonephritis on exam - Urinalysis Dipstick - Urine culture - cephALEXin (KEFLEX) 500 MG capsule; Take 1 capsule (500 mg total) by mouth 4 (four) times daily.  Dispense: 40 capsule; Refill: 0 - POCT UA - Microscopic Only - Return precautions reviewed  2. Hematuria, unspecified type - Urinalysis Dipstick - Urine culture - Ambulatory referral to Urology  3. Recurrent UTI - Ambulatory referral to Urology  Follow up with PCP as needed.  Janora Norlander, DO PGY-3, [facility]

## 2016-03-11 NOTE — Patient Instructions (Signed)
I have placed a referral to urology for you.  I am prescribing the same antibiotic as last time but want you to take it for longer.  If you do not hear from urology by next week, please call me.   Urinary Tract Infection, Adult A urinary tract infection (UTI) is an infection of any part of the urinary tract, which includes the kidneys, ureters, bladder, and urethra. These organs make, store, and get rid of urine in the body. UTI can be a bladder infection (cystitis) or kidney infection (pyelonephritis). What are the causes? This infection may be caused by fungi, viruses, or bacteria. Bacteria are the most common cause of UTIs. This condition can also be caused by repeated incomplete emptying of the bladder during urination. What increases the risk? This condition is more likely to develop if:  You ignore your need to urinate or hold urine for long periods of time.  You do not empty your bladder completely during urination.  You wipe back to front after urinating or having a bowel movement, if you are female.  You are uncircumcised, if you are female.  You are constipated.  You have a urinary catheter that stays in place (indwelling).  You have a weak defense (immune) system.  You have a medical condition that affects your bowels, kidneys, or bladder.  You have diabetes.  You take antibiotic medicines frequently or for long periods of time, and the antibiotics no longer work well against certain types of infections (antibiotic resistance).  You take medicines that irritate your urinary tract.  You are exposed to chemicals that irritate your urinary tract.  You are female. What are the signs or symptoms? Symptoms of this condition include:  Fever.  Frequent urination or passing small amounts of urine frequently.  Needing to urinate urgently.  Pain or burning with urination.  Urine that smells bad or unusual.  Cloudy urine.  Pain in the lower abdomen or back.  Trouble  urinating.  Blood in the urine.  Vomiting or being less hungry than normal.  Diarrhea or abdominal pain.  Vaginal discharge, if you are female. How is this diagnosed? This condition is diagnosed with a medical history and physical exam. You will also need to provide a urine sample to test your urine. Other tests may be done, including:  Blood tests.  Sexually transmitted disease (STD) testing. If you have had more than one UTI, a cystoscopy or imaging studies may be done to determine the cause of the infections. How is this treated? Treatment for this condition often includes a combination of two or more of the following:  Antibiotic medicine.  Other medicines to treat less common causes of UTI.  Over-the-counter medicines to treat pain.  Drinking enough water to stay hydrated. Follow these instructions at home:  Take over-the-counter and prescription medicines only as told by your health care provider.  If you were prescribed an antibiotic, take it as told by your health care provider. Do not stop taking the antibiotic even if you start to feel better.  Avoid alcohol, caffeine, tea, and carbonated beverages. They can irritate your bladder.  Drink enough fluid to keep your urine clear or pale yellow.  Keep all follow-up visits as told by your health care provider. This is important.  Make sure to:  Empty your bladder often and completely. Do not hold urine for long periods of time.  Empty your bladder before and after sex.  Wipe from front to back after a bowel movement  if you are female. Use each tissue one time when you wipe. Contact a health care provider if:  You have back pain.  You have a fever.  You feel nauseous or vomit.  Your symptoms do not get better after 3 days.  Your symptoms go away and then return. Get help right away if:  You have severe back pain or lower abdominal pain.  You are vomiting and cannot keep down any medicines or water. This  information is not intended to replace advice given to you by your health care provider. Make sure you discuss any questions you have with your health care provider. Document Released: 01/09/2005 Document Revised: 09/13/2015 Document Reviewed: 02/20/2015 Elsevier Interactive Patient Education  2017 [employer].

## 2016-03-12 LAB — URINE CULTURE

## 2016-03-14 ENCOUNTER — Other Ambulatory Visit: Payer: Self-pay | Admitting: Cardiology

## 2016-03-20 ENCOUNTER — Ambulatory Visit: Payer: Medicare Other | Admitting: Podiatry

## 2016-03-20 DIAGNOSIS — R310 Gross hematuria: Secondary | ICD-10-CM | POA: Diagnosis not present

## 2016-03-20 DIAGNOSIS — R351 Nocturia: Secondary | ICD-10-CM | POA: Diagnosis not present

## 2016-03-20 DIAGNOSIS — N3946 Mixed incontinence: Secondary | ICD-10-CM | POA: Diagnosis not present

## 2016-03-20 DIAGNOSIS — R350 Frequency of micturition: Secondary | ICD-10-CM | POA: Diagnosis not present

## 2016-03-21 ENCOUNTER — Ambulatory Visit: Payer: Medicare Other | Admitting: Podiatry

## 2016-03-22 ENCOUNTER — Ambulatory Visit: Payer: Self-pay | Admitting: Cardiology

## 2016-03-25 ENCOUNTER — Telehealth: Payer: Self-pay | Admitting: Family Medicine

## 2016-03-25 NOTE — Telephone Encounter (Signed)
Patient would like to know if Dr. Gerlean Ren has received the forms for her incontinence supplies. Patient is worried she won't get them before she is completely out. Please advise.

## 2016-03-27 ENCOUNTER — Ambulatory Visit (INDEPENDENT_AMBULATORY_CARE_PROVIDER_SITE_OTHER): Payer: Medicare Other | Admitting: Podiatry

## 2016-03-27 VITALS — Ht 62.5 in | Wt 146.0 lb

## 2016-03-27 DIAGNOSIS — M79675 Pain in left toe(s): Secondary | ICD-10-CM

## 2016-03-27 DIAGNOSIS — B351 Tinea unguium: Secondary | ICD-10-CM

## 2016-03-27 NOTE — Progress Notes (Signed)
Patient ID: [name], female   DOB: 12/31/1940, 75 y.o.   MRN: [id number] I Visit Type: Patient returns to my office for continued preventative foot care services. Complaint: Patient states" my nails have grown long and thick and become painful to walk and wear shoes  . She presents for preventative foot care services. No changes to ROS  Podiatric Exam: Vascular: dorsalis pedis and posterior tibial pulses are palpable bilateral. Capillary return is immediate. Temperature gradient is WNL. Skin turgor WNL  Sensorium: Normal Semmes Weinstein monofilament test. Normal tactile sensation bilaterally. Nail Exam: Pt has thick disfigured discolored nails with subungual debris noted bilateral entire nail hallux through fifth toenails Ulcer Exam: There is no evidence of ulcer or pre-ulcerative changes or infection. Orthopedic Exam: Muscle tone and strength are WNL. No limitations in general ROM. No crepitus or effusions noted. Foot type and digits show no abnormalities. Bony prominences are unremarkable.  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 Skin: No Porokeratosis. No infection or ulcers.  Distal clavi second toe left foot asymptomatic  Diagnosis:  Tinea unguium, Pain in right toe, pain in left toes  Treatment & Plan Procedures and Treatment: Consent by patient was obtained for treatment procedures. The patient understood the discussion of treatment and procedures well. All questions were answered thoroughly reviewed. Debridement of mycotic and hypertrophic toenails, 1 through 5 bilateral and clearing of subungual debris. No ulceration, no infection noted.  Return Visit-Office Procedure: Patient instructed to return to the office for a follow up visit 3 months for continued evaluation and treatment.  Gardiner Barefoot DPM

## 2016-03-27 NOTE — Telephone Encounter (Signed)
Forms faxed on 03/26/16

## 2016-04-02 DIAGNOSIS — R310 Gross hematuria: Secondary | ICD-10-CM | POA: Diagnosis not present

## 2016-04-02 DIAGNOSIS — R319 Hematuria, unspecified: Secondary | ICD-10-CM | POA: Diagnosis not present

## 2016-04-04 DIAGNOSIS — N3946 Mixed incontinence: Secondary | ICD-10-CM | POA: Diagnosis not present

## 2016-04-04 DIAGNOSIS — R350 Frequency of micturition: Secondary | ICD-10-CM | POA: Diagnosis not present

## 2016-04-05 ENCOUNTER — Ambulatory Visit (INDEPENDENT_AMBULATORY_CARE_PROVIDER_SITE_OTHER): Payer: Medicare Other | Admitting: Family Medicine

## 2016-04-05 ENCOUNTER — Encounter: Payer: Self-pay | Admitting: Family Medicine

## 2016-04-05 ENCOUNTER — Telehealth: Payer: Self-pay | Admitting: Family Medicine

## 2016-04-05 ENCOUNTER — Other Ambulatory Visit: Payer: Self-pay | Admitting: Family Medicine

## 2016-04-05 VITALS — BP 138/68 | HR 74 | Temp 97.8°F | Ht 62.5 in | Wt 142.8 lb

## 2016-04-05 DIAGNOSIS — E7800 Pure hypercholesterolemia, unspecified: Secondary | ICD-10-CM

## 2016-04-05 DIAGNOSIS — Z0289 Encounter for other administrative examinations: Secondary | ICD-10-CM

## 2016-04-05 DIAGNOSIS — R739 Hyperglycemia, unspecified: Secondary | ICD-10-CM

## 2016-04-05 LAB — POCT GLYCOSYLATED HEMOGLOBIN (HGB A1C): HEMOGLOBIN A1C: 5.9

## 2016-04-05 LAB — LIPID PANEL
Cholesterol: 154 mg/dL (ref <200)
HDL: 48 mg/dL — ABNORMAL LOW (ref >50)
LDL CALC: 90 mg/dL (ref <100)
TRIGLYCERIDES: 79 mg/dL (ref <150)
Total CHOL/HDL Ratio: 3.2 Ratio (ref <5.0)
VLDL: 16 mg/dL (ref <30)

## 2016-04-05 MED ORDER — ASPIRIN EC 81 MG PO TBEC: 81.0000 mg | DELAYED_RELEASE_TABLET | Freq: Every day | ORAL | 4 refills | Status: DC

## 2016-04-05 MED ORDER — NITROGLYCERIN 0.4 MG SL SUBL
SUBLINGUAL_TABLET | SUBLINGUAL | 5 refills | Status: AC
Start: 2016-04-05 — End: ?

## 2016-04-05 NOTE — Telephone Encounter (Signed)
Contacted pt and she just wanted to let PCP know that she is taking trimethoprim 100mg  a day and also she wanted to be sure that PCP knew that her aid assist her with her bath and dressing. She wasn't sure if she had mentioned it to her.  Routing to PCP as an Pharmacist, hospital. Katharina Caper, April D, [place]

## 2016-04-05 NOTE — Patient Instructions (Addendum)
Thank you so much for coming to visit today! I have filled out your form for [place]. Please follow up with your Urologist and Nephrologist as scheduled.  Please return over the next 1-2 months for an annual exam.  Dr. Gerlean Ren

## 2016-04-05 NOTE — Progress Notes (Signed)
Subjective:     Patient ID: [name], female   DOB: 11/29/1940, 75 y.o.   MRN: [id number]  HPI Mrs. Gorley is a 75 year old female presenting today for form completion to obtain personal care services. Reports she has had personal care services since 2014. Has a caretaker to come in 7 days a week for several hours each day. Caretaker helps her with activities of daily living such as cooking, cleaning, laundry. Reports she is unable to do these on her on due to problems with balance and knee pain. He is unable to stand for very long. History of hypertension, chronic diastolic heart failure, chronic kidney disease, seizures, and bipolar disorder also noted.  Also of note last lipid panel in October 2016. Last hemoglobin A1c 6.2 in October 2016. Requests refill of nitroglycerin and aspirin. Has not needed nitroglycerin recently but requires updated bottle. Denies chest pain. Follows up with cardiology in January 2018.  Nonsmoker.  Review of Systems Per HPI    Objective:   Physical Exam  Constitutional: She appears well-developed and well-nourished. No distress.  Cardiovascular: Normal rate.   Pulmonary/Chest: Effort normal. No respiratory distress.  Psychiatric: She has a normal mood and affect. Her behavior is normal.      Assessment and Plan:     1. Encounter for completion of form with patient Personal care services form completed and returned to patient. Copy of form faxed to care services organization.   2. Hyperlipidemia Last lipid panel in October 2016. Not currently on a statin. Will recheck lipid panel today.  3. Hyperglycemia Last A1c 6.2 in October 2016. Not currently on medications for diabetes. Will recheck A1c today. Requested to return in 1-2 months for annual exam

## 2016-04-05 NOTE — Telephone Encounter (Signed)
Pt called because she just left her and has some questions about her medication and also a matter that is very important. Please call ASAP. jw

## 2016-05-09 ENCOUNTER — Ambulatory Visit (INDEPENDENT_AMBULATORY_CARE_PROVIDER_SITE_OTHER): Payer: Medicare Other | Admitting: Cardiology

## 2016-05-09 ENCOUNTER — Encounter (INDEPENDENT_AMBULATORY_CARE_PROVIDER_SITE_OTHER): Payer: Self-pay

## 2016-05-09 VITALS — BP 134/62 | HR 70 | Ht 62.0 in | Wt 138.8 lb

## 2016-05-09 DIAGNOSIS — I10 Essential (primary) hypertension: Secondary | ICD-10-CM

## 2016-05-09 DIAGNOSIS — I5032 Chronic diastolic (congestive) heart failure: Secondary | ICD-10-CM | POA: Diagnosis not present

## 2016-05-09 DIAGNOSIS — R55 Syncope and collapse: Secondary | ICD-10-CM | POA: Diagnosis not present

## 2016-05-09 DIAGNOSIS — R0789 Other chest pain: Secondary | ICD-10-CM

## 2016-05-09 NOTE — Patient Instructions (Signed)
Medication Instructions:  Your physician recommends that you continue on your current medications as directed. Please refer to the Current Medication list given to you today.   Labwork: None  Testing/Procedures: NOne  Follow-Up: Your physician wants you to follow-up in: 6 months with Dr. Theodosia Blender assistant. You will receive a reminder letter in the mail two months in advance. If you don't receive a letter, please call our office to schedule the follow-up appointment.   Your physician wants you to follow-up in: 1 year with Dr. Radford Pax. You will receive a reminder letter in the mail two months in advance. If you don't receive a letter, please call our office to schedule the follow-up appointment.   Any Other Special Instructions Will Be Listed Below (If Applicable).     If you need a refill on your cardiac medications before your next appointment, please call your pharmacy.

## 2016-05-09 NOTE — Progress Notes (Signed)
Cardiology Office Note    Date:  05/09/2016   ID:  [name], DOB December 10, 1940, MRN [id number]  PCP:  Junie Panning, DO  Cardiologist:  Fransico Him, MD   Chief Complaint  Patient presents with  . Congestive Heart Failure  . Hypertension    History of Present Illness:  [name] is a 76 y.o. female  with a history of HTN, normal coronary arteries by cath 2015 and chronic diastolic CHF. She is doing well today. She was seen in the ER in November for CP and syncope.  She states that she had sudden onset of sharp chest pain associated with a sensation of palpitations . Pt states that just prior to the onset of her pain that she was eating breakfast, and that her pain presented while she was ambulating to her bathroom. She went into her bathroom to have a bowel movement and the CP became so severe that she had a brief syncopal episode while on her toilet. She did not fall to the floor, and reports that when she woke back up she was still sitting upright. After this episode, she got back up and began to ambulate toward her kitchen and fell over her walker.  Her workup in the ER was normal and she was discharged home.  She says that occasionally she will have a sharp pain in her chest that lasts a few minutes with no radiation.  She occasionally will get diaphoretic with the pain. She denies any SOB, DOE, dizziness, LE edema.  She walks with a rolling walker.    Past Medical History:  Diagnosis Date  . Anemia   . Bipolar 1 disorder ([place])   . Chronic diastolic CHF (congestive heart failure) ([place]) 10/10/2014   LVEDP elevated at time of cath  . Chronic kidney disease   . Depression   . Gout   . Hypertension   . Normal coronary arteries   . Schizoaffective disorder   . Seizure disorder ([place])   . Thrombocytopenia ([place])     Past Surgical History:  Procedure Laterality Date  . APPENDECTOMY    . CARDIAC CATHETERIZATION  2005   Normal  . CARDIAC CATHETERIZATION  11/09/2013   Normal   . LEFT HEART CATHETERIZATION WITH CORONARY ANGIOGRAM N/A 11/09/2013   Procedure: LEFT HEART CATHETERIZATION WITH CORONARY ANGIOGRAM;  Surgeon: Leonie Man, MD;  Location: [facility] CATH LAB;  Service: Cardiovascular;  Laterality: N/A;  . TUBAL LIGATION      Current Medications: Outpatient Medications Prior to Visit  Medication Sig Dispense Refill  . Alum & Mag Hydroxide-Simeth (COMFORT GEL PO) Take 15 mLs by mouth as needed (for stomach).     [name] aspirin EC 81 MG tablet Take 1 tablet (81 mg total) by mouth daily. 90 tablet 4  . Calcium Citrate-Vitamin D (CALCIUM CITRATE + D3 PO) Take 1 tablet by mouth 3 (three) times daily.     . diclofenac sodium (VOLTAREN) 1 % GEL Apply 2 g topically 2 (two) times daily as needed (pain).    [name] diltiazem (CARDIZEM) 60 MG tablet TAKE 1 TABLET(60 MG) BY MOUTH THREE TIMES DAILY 270 tablet 2  . divalproex (DEPAKOTE ER) 500 MG 24 hr tablet Take 1 tablet (500 mg total) by mouth daily. 30 tablet 0  . Multiple Vitamins-Minerals (CENTRUM SILVER ADULT 50+ PO) Take 1 tablet by mouth daily.    . nitroGLYCERIN (NITROSTAT) 0.4 MG SL tablet PLACE 1 TABLET UNDER THE THE TONGUE AS DIRECTED EVERY 5 MINUTES  AS NEEDED FOR CHEST PAIN 25 tablet 5  . pantoprazole (PROTONIX) 40 MG tablet Take 1 tablet (40 mg total) by mouth daily. 30 tablet 3  . polyethylene glycol (MIRALAX / GLYCOLAX) packet Take 17 g by mouth daily as needed for mild constipation. 14 each 6  . Polyvinyl Alcohol-Povidone (REFRESH OP) Apply 1-2 drops to eye at bedtime as needed (dry eyes).    . risperiDONE (RISPERDAL) 2 MG tablet Take 2 mg by mouth at bedtime.     . vitamin C (ASCORBIC ACID) 500 MG tablet Take 500 mg by mouth daily.     No facility-administered medications prior to visit.      Allergies:   Ativan [lorazepam]; Benztropine mesylate; Clonazepam; Vicodin [hydrocodone-acetaminophen]; Lisinopril; and Sulfonamide derivatives   Social History   Social History  . Marital status: Divorced    Spouse name:  N/A  . Number of children: N/A  . Years of education: N/A   Occupational History  . Retired-retail sales Retired    [place] History Main Topics  . Smoking status: Never Smoker  . Smokeless tobacco: Never Used  . Alcohol use No  . Drug use: No  . Sexual activity: Not Currently   Other Topics Concern  . Not on file   Social History Narrative   Health Care POA: Encarnacion Slates, daughter   Emergency Contact: Clydell Hakim [phone]   End of Life Plan: DNR   Live by her self.   Any pets: none   Diet: Patient has a varied diet of protein, starch, and vegetables.   Exercise: Patient walks and uses stationary bike.   Hobbies: Reading the Bible      Lives in [place], uses medicaid transportation to get to appointments.   Is a Company secretary at [employer].                    Family History:  The patient's family history includes Heart attack in her father and mother; Heart disease in her father and mother; Hypertension in her brother.   ROS:   Please see the history of present illness.    ROS All other systems reviewed and are negative.  No flowsheet data found.     PHYSICAL EXAM:   VS:  BP 134/62   Pulse 70   Ht 5\' 2"  (1.575 m)   Wt 138 lb 12.8 oz (63 kg)   BMI 25.39 kg/m    GEN: Well nourished, well developed, in no acute distress  HEENT: normal  Neck: no JVD, carotid bruits, or masses Cardiac: RRR; no murmurs, rubs, or gallops,no edema.  Intact distal pulses bilaterally.  Respiratory:  clear to auscultation bilaterally, normal work of breathing GI: soft, nontender, nondistended, + BS MS: no deformity or atrophy  Skin: warm and dry, no rash Neuro:  Alert and Oriented x 3, Strength and sensation are intact Psych: euthymic mood, full affect  Wt Readings from Last 3 Encounters:  05/09/16 138 lb 12.8 oz (63 kg)  04/05/16 142 lb 12.8 oz (64.8 kg)  03/27/16 146 lb (66.2 kg)      Studies/Labs Reviewed:   EKG:  EKG is not ordered today.   Recent  Labs: 03/02/2016: BUN 15; Creatinine, Ser 1.23; Hemoglobin 12.2; Platelets 177; Potassium 4.4; Sodium 139   Lipid Panel    Component Value Date/Time   CHOL 154 04/05/2016 1522   TRIG 79 04/05/2016 1522   HDL 48 (L) 04/05/2016 1522   CHOLHDL 3.2 04/05/2016  1522   VLDL 16 04/05/2016 1522   LDLCALC 90 04/05/2016 1522   LDLDIRECT 114 (H) 12/10/2006 2054    Additional studies/ records that were reviewed today include:  none    ASSESSMENT:    1. Chronic diastolic CHF (congestive heart failure) ([place])   2. HYPERTENSION, BENIGN SYSTEMIC   3. Vasovagal syncope   4. Chest pain, atypical      PLAN:  In order of problems listed above:  1. Chronic diastolic CHF - she appears euvolemic in exam and weight is stable and she has actually lost 4lbs.  2. HTN - BP controlled on current meds.  She will continue on CCB 3. Vasovagal syncope - ? Whether this was true syncope as she never fell off the commode and was sitting upright when she woke up.  She has not had any reoccurence of her syncope that was likely vasovagal in the setting of pain and having a BM.  I have asked her to let me know if she has any more dizzy spells or syncope. 4. Atypical CP with normal coronary arteries 2 years ago.  Her pain is sharp and occurred after eating am meal very fast. No further cardiac workup at this time.      Medication Adjustments/Labs and Tests Ordered: Current medicines are reviewed at length with the patient today.  Concerns regarding medicines are outlined above.  Medication changes, Labs and Tests ordered today are listed in the Patient Instructions below.  There are no Patient Instructions on file for this visit.   Signed, Fransico Him, MD  05/09/2016 11:09 AM    [address], [place], [place]  [address] Phone: [phone]; Fax: [phone]

## 2016-05-10 DIAGNOSIS — K7689 Other specified diseases of liver: Secondary | ICD-10-CM | POA: Diagnosis not present

## 2016-05-10 DIAGNOSIS — D3000 Benign neoplasm of unspecified kidney: Secondary | ICD-10-CM | POA: Diagnosis not present

## 2016-05-13 ENCOUNTER — Other Ambulatory Visit: Payer: Self-pay | Admitting: Urology

## 2016-05-13 DIAGNOSIS — R160 Hepatomegaly, not elsewhere classified: Secondary | ICD-10-CM

## 2016-05-14 ENCOUNTER — Other Ambulatory Visit: Payer: Self-pay | Admitting: Urology

## 2016-05-14 ENCOUNTER — Other Ambulatory Visit: Payer: Self-pay | Admitting: General Surgery

## 2016-05-14 DIAGNOSIS — K7689 Other specified diseases of liver: Secondary | ICD-10-CM

## 2016-05-16 ENCOUNTER — Encounter (HOSPITAL_COMMUNITY): Payer: Self-pay

## 2016-05-16 ENCOUNTER — Ambulatory Visit (HOSPITAL_COMMUNITY)
Admission: RE | Admit: 2016-05-16 | Discharge: 2016-05-16 | Disposition: A | Discharge status: Home / Self Care | Payer: Medicare Other | Source: Ambulatory Visit | Attending: Urology | Admitting: Urology

## 2016-05-16 DIAGNOSIS — G40909 Epilepsy, unspecified, not intractable, without status epilepticus: Secondary | ICD-10-CM | POA: Insufficient documentation

## 2016-05-16 DIAGNOSIS — D696 Thrombocytopenia, unspecified: Secondary | ICD-10-CM | POA: Insufficient documentation

## 2016-05-16 DIAGNOSIS — N189 Chronic kidney disease, unspecified: Secondary | ICD-10-CM | POA: Diagnosis not present

## 2016-05-16 DIAGNOSIS — I130 Hypertensive heart and chronic kidney disease with heart failure and stage 1 through stage 4 chronic kidney disease, or unspecified chronic kidney disease: Secondary | ICD-10-CM | POA: Diagnosis not present

## 2016-05-16 DIAGNOSIS — Z882 Allergy status to sulfonamides status: Secondary | ICD-10-CM | POA: Insufficient documentation

## 2016-05-16 DIAGNOSIS — F319 Bipolar disorder, unspecified: Secondary | ICD-10-CM | POA: Diagnosis not present

## 2016-05-16 DIAGNOSIS — Z888 Allergy status to other drugs, medicaments and biological substances status: Secondary | ICD-10-CM | POA: Diagnosis not present

## 2016-05-16 DIAGNOSIS — C220 Liver cell carcinoma: Secondary | ICD-10-CM | POA: Diagnosis not present

## 2016-05-16 DIAGNOSIS — Z8249 Family history of ischemic heart disease and other diseases of the circulatory system: Secondary | ICD-10-CM | POA: Insufficient documentation

## 2016-05-16 DIAGNOSIS — I5032 Chronic diastolic (congestive) heart failure: Secondary | ICD-10-CM | POA: Diagnosis not present

## 2016-05-16 DIAGNOSIS — M109 Gout, unspecified: Secondary | ICD-10-CM | POA: Diagnosis not present

## 2016-05-16 DIAGNOSIS — Z885 Allergy status to narcotic agent status: Secondary | ICD-10-CM | POA: Insufficient documentation

## 2016-05-16 DIAGNOSIS — K769 Liver disease, unspecified: Secondary | ICD-10-CM | POA: Diagnosis present

## 2016-05-16 DIAGNOSIS — Z7982 Long term (current) use of aspirin: Secondary | ICD-10-CM | POA: Diagnosis not present

## 2016-05-16 DIAGNOSIS — R160 Hepatomegaly, not elsewhere classified: Secondary | ICD-10-CM | POA: Diagnosis not present

## 2016-05-16 DIAGNOSIS — Z79899 Other long term (current) drug therapy: Secondary | ICD-10-CM | POA: Diagnosis not present

## 2016-05-16 LAB — CBC
HCT: 30.6 % — ABNORMAL LOW (ref 36.0–46.0)
Hemoglobin: 10.2 g/dL — ABNORMAL LOW (ref 12.0–15.0)
MCH: 27 pg (ref 26.0–34.0)
MCHC: 33.3 g/dL (ref 30.0–36.0)
MCV: 81 fL (ref 78.0–100.0)
PLATELETS: 146 10*3/uL — AB (ref 150–400)
RBC: 3.78 MIL/uL — ABNORMAL LOW (ref 3.87–5.11)
RDW: 17.4 % — AB (ref 11.5–15.5)
WBC: 4.1 10*3/uL (ref 4.0–10.5)

## 2016-05-16 LAB — PROTIME-INR
INR: 1.08
Prothrombin Time: 14 seconds (ref 11.4–15.2)

## 2016-05-16 LAB — APTT: aPTT: 27 seconds (ref 24–36)

## 2016-05-16 MED ORDER — LIDOCAINE HCL (PF) 1 % IJ SOLN
INTRAMUSCULAR | Status: AC
  Filled 2016-05-16: qty 10

## 2016-05-16 MED ORDER — SODIUM CHLORIDE 0.9 % IV SOLN
INTRAVENOUS | Status: DC
  Administered 2016-05-16: 12:00:00 via INTRAVENOUS

## 2016-05-16 MED ORDER — GELATIN ABSORBABLE 12-7 MM EX MISC
CUTANEOUS | Status: AC
  Filled 2016-05-16: qty 1

## 2016-05-16 MED ORDER — FENTANYL CITRATE (PF) 100 MCG/2ML IJ SOLN
INTRAMUSCULAR | Status: AC | PRN
  Administered 2016-05-16: 50 ug via INTRAVENOUS

## 2016-05-16 MED ORDER — FENTANYL CITRATE (PF) 100 MCG/2ML IJ SOLN
INTRAMUSCULAR | Status: AC
  Filled 2016-05-16: qty 2

## 2016-05-16 MED ORDER — MIDAZOLAM HCL 2 MG/2ML IJ SOLN
INTRAMUSCULAR | Status: AC | PRN
  Administered 2016-05-16 (×2): 1 mg via INTRAVENOUS

## 2016-05-16 MED ORDER — MIDAZOLAM HCL 2 MG/2ML IJ SOLN
INTRAMUSCULAR | Status: AC
  Filled 2016-05-16: qty 4

## 2016-05-16 NOTE — Discharge Instructions (Signed)
Liver Biopsy, Care After °Introduction °These instructions give you information on caring for yourself after your procedure. Your doctor may also give you more specific instructions. Call your doctor if you have any problems or questions after your procedure. °Follow these instructions at home: °· Rest at home for 1-2 days or as told by your doctor. °· Have someone stay with you for at least 24 hours. °· Do not do these things in the first 24 hours: °¨ Drive. °¨ Use machinery. °¨ Take care of other people. °¨ Sign legal documents. °¨ Take a bath or shower. °· There are many different ways to close and cover a cut (incision). For example, a cut can be closed with stitches, skin glue, or adhesive strips. Follow your doctor's instructions on: °¨ Taking care of your cut. °¨ Changing and removing your bandage (dressing). °¨ Removing whatever was used to close your cut. °· Do not drink alcohol in the first week. °· Do not lift more than 5 pounds or play contact sports for the first 2 weeks. °· Take medicines only as told by your doctor. For 1 week, do not take medicine that has aspirin in it or medicines like ibuprofen. °· Get your test results. °Contact a doctor if: °· A cut bleeds and leaves more than just a small spot of blood. °· A cut is red, puffs up (swells), or hurts more than before. °· Fluid or something else comes from a cut. °· A cut smells bad. °· You have a fever or chills. °Get help right away if: °· You have swelling, bloating, or pain in your belly (abdomen). °· You get dizzy or faint. °· You have a rash. °· You feel sick to your stomach (nauseous) or throw up (vomit). °· You have trouble breathing, feel short of breath, or feel faint. °· Your chest hurts. °· You have problems talking or seeing. °· You have trouble balancing or moving your arms or legs. °This information is not intended to replace advice given to you by your health care provider. Make sure you discuss any questions you have with your  health care provider. °Document Released: 01/09/2008 Document Revised: 09/07/2015 Document Reviewed: 05/28/2013 °© 2017 Elsevier °Moderate Conscious Sedation, Adult, Care After °These instructions provide you with information about caring for yourself after your procedure. Your health care provider may also give you more specific instructions. Your treatment has been planned according to current medical practices, but problems sometimes occur. Call your health care provider if you have any problems or questions after your procedure. °What can I expect after the procedure? °After your procedure, it is common: °· To feel sleepy for several hours. °· To feel clumsy and have poor balance for several hours. °· To have poor judgment for several hours. °· To vomit if you eat too soon. °Follow these instructions at home: °For at least 24 hours after the procedure:  °· Do not: °¨ Participate in activities where you could fall or become injured. °¨ Drive. °¨ Use heavy machinery. °¨ Drink alcohol. °¨ Take sleeping pills or medicines that cause drowsiness. °¨ Make important decisions or sign legal documents. °¨ Take care of children on your own. °· Rest. °Eating and drinking °· Follow the diet recommended by your health care provider. °· If you vomit: °¨ Drink water, juice, or soup when you can drink without vomiting. °¨ Make sure you have little or no nausea before eating solid foods. °General instructions °· Have a responsible adult stay with you until   you are awake and alert. °· Take over-the-counter and prescription medicines only as told by your health care provider. °· If you smoke, do not smoke without supervision. °· Keep all follow-up visits as told by your health care provider. This is important. °Contact a health care provider if: °· You keep feeling nauseous or you keep vomiting. °· You feel light-headed. °· You develop a rash. °· You have a fever. °Get help right away if: °· You have trouble breathing. °This  information is not intended to replace advice given to you by your health care provider. Make sure you discuss any questions you have with your health care provider. °Document Released: 01/20/2013 Document Revised: 09/04/2015 Document Reviewed: 07/22/2015 °Elsevier Interactive Patient Education © 2017 Elsevier Inc. ° °

## 2016-05-16 NOTE — Sedation Documentation (Signed)
Patient is resting comfortably. 

## 2016-05-16 NOTE — Procedures (Signed)
US guided liver lesion biopsy.  2 cores obtained.  Biopsy tract injected with Gelfoam for hemostasis.  No immediate complication.  See full report in Imaging.

## 2016-05-16 NOTE — H&P (Signed)
Chief Complaint: Patient was seen in consultation today for liver lesion biopsy at the request of Herrick,Benjamin W  Referring Physician(s): Herrick,Benjamin W  Supervising Physician: Markus Daft  Patient Status: [facility] - In-pt  History of Present Illness: [name] is a 76 y.o. female   Pt was seen in PMD office for hematuria Was sent to Dr Jearl Klinefelter for evaluation and treatment Was noted to have bladder abnormality on [place] per patient. CT scan in outside facility revealed Left renal cyst and liver lesion  IMPRESSION: 1. No acute findings within the abdomen or pelvis. 2. There is a large heterogeneous mass within the dome of liver which is new from 06/08/2011 and is worrisome for tumor. Correlation with tissue sampling is advised. 3. Diffuse bladder wall thickening with a single subcentimeter<BR>enhancing filling defect arising from the undersurface of the dome of bladder. Cannot rule out small urothelial tumor. 4. Subcentimeter enhancing lesion within the left kidney is identified. Although too small to reliably characterize this is suspicious for a small renal cell carcinoma. 5. Aortic atherosclerosis 6. 4 mm nodule is identified within the left lower lobe.  Pt now states she has been cleared by Dr Louis Meckel for any bladder issues. But has been referred to IR for liver lesion biopsy  Dr Annamaria Boots reviewed outside imaging and approves liver lesion biopsy    Past Medical History:  Diagnosis Date  . Anemia   . Bipolar 1 disorder ([place])   . Chronic diastolic CHF (congestive heart failure) ([place]) 10/10/2014   LVEDP elevated at time of cath  . Chronic kidney disease   . Depression   . Gout   . Hypertension   . Normal coronary arteries   . Schizoaffective disorder   . Seizure disorder ([place])   . Thrombocytopenia ([place])     Past Surgical History:  Procedure Laterality Date  . APPENDECTOMY    . CARDIAC CATHETERIZATION  2005   Normal  . CARDIAC CATHETERIZATION  11/09/2013   Normal    . LEFT HEART CATHETERIZATION WITH CORONARY ANGIOGRAM N/A 11/09/2013   Procedure: LEFT HEART CATHETERIZATION WITH CORONARY ANGIOGRAM;  Surgeon: Leonie Man, MD;  Location: [facility] CATH LAB;  Service: Cardiovascular;  Laterality: N/A;  . TUBAL LIGATION      Allergies: Ativan [lorazepam]; Benztropine mesylate; Clonazepam; Vicodin [hydrocodone-acetaminophen]; Lisinopril; and Sulfonamide derivatives  Medications: Prior to Admission medications   Medication Sig Start Date End Date Taking? Authorizing Provider  Alum & Mag Hydroxide-Simeth (COMFORT GEL PO) Take 15 mLs by mouth as needed (for stomach).    Yes Historical Provider, MD  aspirin EC 81 MG tablet Take 1 tablet (81 mg total) by mouth daily. 04/05/16  Yes [place] N Rumley, DO  Calcium Citrate-Vitamin D (CALCIUM CITRATE + D3 PO) Take 1 tablet by mouth 3 (three) times daily.    Yes Historical Provider, MD  Chlorpheniramine-Acetaminophen (CORICIDIN HBP COLD/FLU PO) Take 1-2 tablets by mouth 2 (two) times daily as needed. Takes 1 tablet once during day as needed for cold/flu symptoms and 2 tablets at bedtime as needed for cold/flu symptoms   Yes Historical Provider, MD  diclofenac sodium (VOLTAREN) 1 % GEL Apply 2 g topically 2 (two) times daily as needed (pain).   Yes Historical Provider, MD  diltiazem (CARDIZEM) 60 MG tablet TAKE 1 TABLET(60 MG) BY MOUTH THREE TIMES DAILY 12/25/15  Yes Sueanne Margarita, MD  divalproex (DEPAKOTE ER) 500 MG 24 hr tablet Take 1 tablet (500 mg total) by mouth daily. 01/18/13  Yes  Hilton Sinclair, MD  Multiple Vitamins-Minerals (CENTRUM SILVER ADULT 50+ PO) Take 1 tablet by mouth daily.   Yes Historical Provider, MD  nitroGLYCERIN (NITROSTAT) 0.4 MG SL tablet PLACE 1 TABLET UNDER THE THE TONGUE AS DIRECTED EVERY 5 MINUTES AS NEEDED FOR CHEST PAIN 04/05/16  Yes [place] N Rumley, DO  pantoprazole (PROTONIX) 40 MG tablet Take 1 tablet (40 mg total) by mouth daily. 02/28/16  Yes Bradley N Rumley, DO  polyethylene glycol  (MIRALAX / GLYCOLAX) packet Take 17 g by mouth daily as needed for mild constipation. 01/26/15  Yes [employer] N Rumley, DO  Polyvinyl Alcohol-Povidone (REFRESH OP) Apply 1-2 drops to eye at bedtime as needed (dry eyes).   Yes Historical Provider, MD  risperiDONE (RISPERDAL) 2 MG tablet Take 2 mg by mouth at bedtime.    Yes Historical Provider, MD  vitamin C (ASCORBIC ACID) 500 MG tablet Take 500 mg by mouth daily.   Yes Historical Provider, MD     Family History  Problem Relation Age of Onset  . Heart attack Mother   . Heart disease Mother   . Heart attack Father   . Heart disease Father   . Hypertension Brother     Social History   Social History  . Marital status: Divorced    Spouse name: N/A  . Number of children: N/A  . Years of education: N/A   Occupational History  . Retired-retail sales Retired    [place] History Main Topics  . Smoking status: Never Smoker  . Smokeless tobacco: Never Used  . Alcohol use No  . Drug use: No  . Sexual activity: Not Currently   Other Topics Concern  . None   Social History Narrative   Health Care POA: Encarnacion Slates, daughter   Emergency Contact: RL Tolten [phone]   End of Life Plan: DNR   Live by her self.   Any pets: none   Diet: Patient has a varied diet of protein, starch, and vegetables.   Exercise: Patient walks and uses stationary bike.   Hobbies: Reading the Bible      Lives in [place], uses medicaid transportation to get to appointments.   Is a Company secretary at [employer].                   Review of Systems: A 12 point ROS discussed and pertinent positives are indicated in the HPI above.  All other systems are negative.  Review of Systems  Constitutional: Negative for activity change, fatigue and fever.  Respiratory: Negative for cough and shortness of breath.   Cardiovascular: Negative for chest pain.  Gastrointestinal: Negative for abdominal pain.  Genitourinary: Negative for hematuria.    Neurological: Negative for weakness.  Psychiatric/Behavioral: Negative for behavioral problems and confusion.    Vital Signs: BP (!) 165/73   Pulse 90   Temp 98.5 F (36.9 C) (Oral)   Ht 5\' 2"  (1.575 m)   Wt 138 lb 8 oz (62.8 kg)   SpO2 98%   BMI 25.33 kg/m   Physical Exam  Constitutional: She is oriented to person, place, and time.  Cardiovascular: Normal rate, regular rhythm and normal heart sounds.   Pulmonary/Chest: Effort normal and breath sounds normal.  Abdominal: Soft. Bowel sounds are normal.  Musculoskeletal: Normal range of motion.  Neurological: She is alert and oriented to person, place, and time.  Skin: Skin is warm and dry.  Psychiatric: She has a normal mood and affect.  Her behavior is normal. Judgment and thought content normal.  Nursing note and vitals reviewed.   Mallampati Score:  MD Evaluation Airway: WNL Heart: WNL Abdomen: WNL Chest/ Lungs: WNL ASA  Classification: 2 Mallampati/Airway Score: One  Imaging: No results found.  Labs:  CBC:  Recent Labs  06/24/15 1930 03/02/16 1513  WBC 5.9 6.8  HGB 11.3* 12.2  HCT 33.5* 37.0  PLT 150 177    COAGS: No results for input(s): INR, APTT in the last 8760 hours.  BMP:  Recent Labs  06/24/15 1930 03/02/16 1513  NA 143 139  K 4.1 4.4  CL 102 105  CO2 26 25  GLUCOSE 128* 98  BUN 15 15  CALCIUM 10.3 10.6*  CREATININE 1.14* 1.23*  GFRNONAA 46* 42*  GFRAA 53* 48*    LIVER FUNCTION TESTS: No results for input(s): BILITOT, AST, ALT, ALKPHOS, PROT, ALBUMIN in the last 8760 hours.  TUMOR MARKERS: No results for input(s): AFPTM, CEA, CA199, CHROMGRNA in the last 8760 hours.  Assessment and Plan:  Liver lesion Now for biopsy Risks and Benefits discussed with the patient including, but not limited to bleeding, infection, damage to adjacent structures or low yield requiring additional tests. All of the patient's questions were answered, patient is agreeable to proceed. Consent  signed and in chart.   Thank you for this interesting consult.  I greatly enjoyed meeting [name] and look forward to participating in their care.  A copy of this report was sent to the requesting provider on this date.  Electronically Signed: Monia Sabal A 05/16/2016, 12:20 PM   I spent a total of  30 Minutes   in face to face in clinical consultation, greater than 50% of which was counseling/coordinating care for liver lesion biopsy

## 2016-05-21 ENCOUNTER — Ambulatory Visit (HOSPITAL_COMMUNITY)
Admission: RE | Admit: 2016-05-21 | Discharge: 2016-05-21 | Disposition: A | Discharge status: Home / Self Care | Payer: Medicare Other | Source: Ambulatory Visit | Attending: Urology | Admitting: Urology

## 2016-05-21 DIAGNOSIS — K7689 Other specified diseases of liver: Secondary | ICD-10-CM | POA: Diagnosis not present

## 2016-05-21 DIAGNOSIS — N289 Disorder of kidney and ureter, unspecified: Secondary | ICD-10-CM | POA: Diagnosis not present

## 2016-05-21 DIAGNOSIS — K8020 Calculus of gallbladder without cholecystitis without obstruction: Secondary | ICD-10-CM | POA: Diagnosis not present

## 2016-05-21 DIAGNOSIS — R160 Hepatomegaly, not elsewhere classified: Secondary | ICD-10-CM | POA: Insufficient documentation

## 2016-05-21 LAB — POCT I-STAT CREATININE: Creatinine, Ser: 1.2 mg/dL — ABNORMAL HIGH (ref 0.44–1.00)

## 2016-05-21 MED ORDER — GADOXETATE DISODIUM 0.25 MMOL/ML IV SOLN: 10.0000 mL | Freq: Once | INTRAVENOUS | Status: DC | PRN

## 2016-05-21 MED ORDER — GADOBENATE DIMEGLUMINE 529 MG/ML IV SOLN: 15.0000 mL | Freq: Once | INTRAVENOUS | Status: DC | PRN

## 2016-05-23 DIAGNOSIS — R350 Frequency of micturition: Secondary | ICD-10-CM | POA: Diagnosis not present

## 2016-05-23 DIAGNOSIS — N3946 Mixed incontinence: Secondary | ICD-10-CM | POA: Diagnosis not present

## 2016-05-27 MED ORDER — GADOXETATE DISODIUM 0.25 MMOL/ML IV SOLN
6.0000 mL | Freq: Once | INTRAVENOUS | Status: AC | PRN
  Administered 2016-05-21: 6 mL via INTRAVENOUS

## 2016-05-30 ENCOUNTER — Telehealth: Payer: Self-pay | Admitting: Urology

## 2016-05-30 NOTE — Telephone Encounter (Signed)
-----   Message from Stark Klein, MD sent at 05/29/2016  9:40 AM EST ----- Regarding: RE: new [place] diagnosis Sure, dr. Benay Spice is good.  tx FB  ----- Message ----- From: Ardis Hughs, MD Sent: 05/29/2016   5:10 AM To: Stark Klein, MD Subject: FW: new [place] diagnosis                          I'm not sure what's going on here, perhaps some anger over her diagnosis.  I don't think she has the means to navigate this alone.  Maybe she should be set up to see Dr. Benay Spice?  I will try and call her, but who else could I send her to?  Thanks, Suezanne Jacquet ----- Message ----- From: Patsey Berthold Sent: 05/28/2016  12:02 PM To: Stark Klein, MD, Ardis Hughs, MD Subject: RE: new [place] diagnosis                          EMILY,  REC'D CALL FROM PATIENT SHE STATED THAT SHE DIDNT COME FOR THIS FIRST APPT BECAUSE SHE DIDNT WANT TO, AND SHE SAID THAT SOMEONE FROM OUR OFFICE CALLED HER TO REMIND HER OF AN APPT TOMORROW AND SHE WAS CANCELLING AND WAS NEVER COMING HERE, I ASKED HER FOR HER DATE OF BIRTH AND HER NAME AND SHE STATED IVE ALREADY DONE WHAT I NEEDED TO DO AND THATS TELLING YOU THAT IM NOT COMING TOMORROW OR EVER AND THAT I DIDNT NEED HER INFO. SO I LOOKED UP HER NUMBER IN OUR SYSTEM AND CANCELLED HER APPT.. SHE STATED SHE WAS NEVER HAVING SURGERY HERE.   THANKS JESSICA ----- Message ----- From: Ardis Hughs, MD Sent: 05/21/2016   6:41 PM To: Stark Klein, MD, Patsey Berthold Subject: new [place] diagnosis                              Dr. Barry Dienes is out of town and asked that I include you in the correspondence in attempt to get her scheduled with Dorris Fetch early next week.  She has a new diagnosis of hepatocellular carcinoma (biospy proven) and has had a liver MRI.  Thank you, Burman Nieves

## 2016-05-30 NOTE — Telephone Encounter (Signed)
The patient has a diagnosis of hepatocellular carcinoma 9 cm liver mass. I called and spoke to the patient about her decision not to be seen by general surgery. She states that she has chosen not to treat her cancer. She that she does not need any additional help at this time. I offered to have her referred to the cancer center and to hospice.  She is stating that God was her Dr., and if he wanted her to die then she would die, in pain or not.  I told the patient that I respected her decision, again reiterated my recommendations for referral to oncology for palliative care/ hospice, and that if I could help her in anyway to please reach back out to me.

## 2016-06-06 ENCOUNTER — Telehealth: Payer: Self-pay | Admitting: Family Medicine

## 2016-06-06 ENCOUNTER — Encounter: Payer: Self-pay | Admitting: Family Medicine

## 2016-06-06 ENCOUNTER — Ambulatory Visit (INDEPENDENT_AMBULATORY_CARE_PROVIDER_SITE_OTHER): Payer: Medicare Other | Admitting: Family Medicine

## 2016-06-06 DIAGNOSIS — R569 Unspecified convulsions: Secondary | ICD-10-CM | POA: Diagnosis present

## 2016-06-06 DIAGNOSIS — R55 Syncope and collapse: Secondary | ICD-10-CM | POA: Diagnosis not present

## 2016-06-06 DIAGNOSIS — J069 Acute upper respiratory infection, unspecified: Secondary | ICD-10-CM

## 2016-06-06 NOTE — Assessment & Plan Note (Signed)
Likely due to URI, poor PO and antihistamine.  FU Dr. Gerlean Ren 1-2 wks.

## 2016-06-06 NOTE — Assessment & Plan Note (Signed)
Doubt seizure, sounds like syncope.  FU 1-2 wks with PCP

## 2016-06-06 NOTE — Assessment & Plan Note (Signed)
Stop OTC med.  Use only tylenol.  Push fluids.

## 2016-06-06 NOTE — Telephone Encounter (Signed)
Pt wants Dr. Gerlean Ren to call her today about her liver. ep

## 2016-06-06 NOTE — Patient Instructions (Signed)
Do not take the [place] anymore. You can take tylenol for fever or aching.  You can take either one extra strength or two regular strength tylenol every 6 hours.  Remember that you don't need to take it to get over this cold. Very important, drink plenty of fluids while you are sick. See Dr. Gerlean Ren in a week or two to check on the passing out spells and the weight loss.  If they continue, she will want to do tests.

## 2016-06-06 NOTE — Progress Notes (Signed)
   Subjective:    Patient ID: [name], female    DOB: 1940-10-10, 76 y.o.   MRN: [id number]  HPI 76 yo female with schizoaffective disorder presents with 6 day hx of cough productive of scant phlegm, runny nose and sweating (no documented fever.)  Denies SOB, high fever, muscle aches or headaches.  [place] with communal dining.  Likely exposed there.  Does not take flu shot "they make me sick."  Kicker is that she "passed out" twice.  Once in the dining room and once in her apartment.  No reported seizure activity.  No loss of bowel or bladder.  No injury.  Both seizure and syncope are on her problem list.  This occurred both times right after taking OTC cold meds.  States taking PO fine.     Review of Systems     Objective:   Physical Exam Note 10 lb wt loss and lowish BP  Gen NAD HEENT. Slightly injected throat, otherwise nl Neck without nodes. LUngs clear Cardiac RRR without m or g        Assessment & Plan:

## 2016-06-07 NOTE — Telephone Encounter (Signed)
Returned patient's call. States she has had a recent diagnosis of Hepatocellular Carcinoma. Does not wish to be evaluated by Oncology or have further workup done, stating the Reita Cliche will support her and if it's her time to go then it's her time to go. Has strong support by her family and church family. Does request I add her to my church prayer list so she can have more people praying for her--I will list her on the prayer list using abbreviated name to avoid violation of HIPPA. Emphasized that we are here for her to help her through this and offer her any needed support. Encouraged to follow up in office.

## 2016-06-11 ENCOUNTER — Ambulatory Visit: Payer: Self-pay | Admitting: Family Medicine

## 2016-06-19 ENCOUNTER — Ambulatory Visit: Payer: Medicare Other | Admitting: Podiatry

## 2016-06-19 ENCOUNTER — Ambulatory Visit (INDEPENDENT_AMBULATORY_CARE_PROVIDER_SITE_OTHER): Payer: Medicare Other | Admitting: Podiatry

## 2016-06-19 ENCOUNTER — Encounter: Payer: Self-pay | Admitting: Podiatry

## 2016-06-19 ENCOUNTER — Ambulatory Visit: Payer: Self-pay | Admitting: Podiatry

## 2016-06-19 VITALS — Ht 62.0 in | Wt 136.0 lb

## 2016-06-19 DIAGNOSIS — M79675 Pain in left toe(s): Secondary | ICD-10-CM | POA: Diagnosis not present

## 2016-06-19 DIAGNOSIS — B351 Tinea unguium: Secondary | ICD-10-CM

## 2016-06-19 MED ORDER — AMMONIUM LACTATE 12 % EX CREA: TOPICAL_CREAM | CUTANEOUS | 0 refills | Status: DC | PRN

## 2016-06-19 NOTE — Progress Notes (Signed)
Patient ID: [name], female   DOB: 02-03-1941, 76 y.o.   MRN: [id number] I Visit Type: Patient returns to my office for continued preventative foot care services. Complaint: Patient states" my nails have grown long and thick and become painful to walk and wear shoes  . She presents for preventative foot care services. No changes to ROS She has developed peeling on back of both heels which she has applied lotion and vaseline with minimal improvement.  Podiatric Exam: Vascular: dorsalis pedis and posterior tibial pulses are palpable bilateral. Capillary return is immediate. Temperature gradient is WNL. Skin turgor WNL  Sensorium: Normal Semmes Weinstein monofilament test. Normal tactile sensation bilaterally. Nail Exam: Pt has thick disfigured discolored nails with subungual debris noted bilateral entire nail hallux through fifth toenails Ulcer Exam: There is no evidence of ulcer or pre-ulcerative changes or infection. Orthopedic Exam: Muscle tone and strength are WNL. No limitations in general ROM. No crepitus or effusions noted. Foot type and digits show no abnormalities. Bony prominences are unremarkable.  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 Skin: No Porokeratosis. No infection or ulcers.  Peeling noted at plantar posterior aspect heels  B/L  Diagnosis:  Tinea unguium, Pain in right toe, pain in left toes  Treatment & Plan Procedures and Treatment: Consent by patient was obtained for treatment procedures. The patient understood the discussion of treatment and procedures well. All questions were answered thoroughly reviewed. Debridement of mycotic and hypertrophic toenails, 1 through 5 bilateral and clearing of subungual debris. No ulceration, no infection noted.  Prescribe lac-hydrin. Return Visit-Office Procedure: Patient instructed to return to the office for a follow up visit 3 months for continued evaluation and treatment.  Gardiner Barefoot DPM

## 2016-06-20 ENCOUNTER — Ambulatory Visit: Payer: Self-pay | Admitting: Family Medicine

## 2016-07-03 ENCOUNTER — Other Ambulatory Visit: Payer: Self-pay | Admitting: Family Medicine

## 2016-07-10 DIAGNOSIS — F319 Bipolar disorder, unspecified: Secondary | ICD-10-CM | POA: Diagnosis not present

## 2016-09-05 ENCOUNTER — Telehealth: Payer: Self-pay | Admitting: Family Medicine

## 2016-09-05 NOTE — Telephone Encounter (Signed)
Pt would like for Dr Gerlean Ren to respond to [facility] request for pt pullups.  She wears them daily and needs them

## 2016-09-06 NOTE — Telephone Encounter (Signed)
Form signed and faxed 2 days ago when received. Dr. Gerlean Ren

## 2016-09-18 ENCOUNTER — Ambulatory Visit: Payer: Medicare Other | Admitting: Podiatry

## 2016-09-25 ENCOUNTER — Encounter: Payer: Self-pay | Admitting: Podiatry

## 2016-09-25 ENCOUNTER — Ambulatory Visit (INDEPENDENT_AMBULATORY_CARE_PROVIDER_SITE_OTHER): Payer: Medicare Other | Admitting: Podiatry

## 2016-09-25 DIAGNOSIS — B351 Tinea unguium: Secondary | ICD-10-CM

## 2016-09-25 DIAGNOSIS — M79675 Pain in left toe(s): Secondary | ICD-10-CM

## 2016-09-25 MED ORDER — AMMONIUM LACTATE 12 % EX CREA: TOPICAL_CREAM | CUTANEOUS | 0 refills | Status: DC | PRN

## 2016-09-25 NOTE — Progress Notes (Signed)
Patient ID: [name], female   DOB: Jul 22, 1940, 76 y.o.   MRN: [id number] I Visit Type: Patient returns to my office for continued preventative foot care services. Complaint: Patient states" my nails have grown long and thick and become painful to walk and wear shoes  . She presents for preventative foot care services. No changes to ROS She has developed peeling on back of her feet and she says the lac-hydrin is working well.  Podiatric Exam: Vascular: dorsalis pedis and posterior tibial pulses are palpable bilateral. Capillary return is immediate. Temperature gradient is WNL. Skin turgor WNL  Sensorium: Normal Semmes Weinstein monofilament test. Normal tactile sensation bilaterally. Nail Exam: Pt has thick disfigured discolored nails with subungual debris noted bilateral entire nail hallux through fifth toenails Ulcer Exam: There is no evidence of ulcer or pre-ulcerative changes or infection. Orthopedic Exam: Muscle tone and strength are WNL. No limitations in general ROM. No crepitus or effusions noted. Foot type and digits show no abnormalities. Bony prominences are unremarkable.  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 Skin: No Porokeratosis. No infection or ulcers.  Peeling noted at plantar posterior aspect heels  B/L  Diagnosis:  Tinea unguium, Pain in right toe, pain in left toes  Treatment & Plan Procedures and Treatment: Consent by patient was obtained for treatment procedures. The patient understood the discussion of treatment and procedures well. All questions were answered thoroughly reviewed. Debridement of mycotic and hypertrophic toenails, 1 through 5 bilateral and clearing of subungual debris. No ulceration, no infection noted.  Prescribe lac-hydrin. Return Visit-Office Procedure: Patient instructed to return to the office for a follow up visit 3 months for continued evaluation and treatment.  Gardiner Barefoot DPM

## 2016-09-25 NOTE — Addendum Note (Signed)
Addended byDeidre Ala, Charlyn Vialpando L on: 09/25/2016 09:28 AM   Modules accepted: Orders

## 2016-09-26 DIAGNOSIS — H5203 Hypermetropia, bilateral: Secondary | ICD-10-CM | POA: Diagnosis not present

## 2016-09-26 DIAGNOSIS — H524 Presbyopia: Secondary | ICD-10-CM | POA: Diagnosis not present

## 2016-09-26 DIAGNOSIS — H25013 Cortical age-related cataract, bilateral: Secondary | ICD-10-CM | POA: Diagnosis not present

## 2016-09-26 DIAGNOSIS — H2513 Age-related nuclear cataract, bilateral: Secondary | ICD-10-CM | POA: Diagnosis not present

## 2016-09-30 ENCOUNTER — Other Ambulatory Visit: Payer: Self-pay | Admitting: Cardiology

## 2016-10-14 ENCOUNTER — Encounter: Payer: Self-pay | Admitting: Physician Assistant

## 2016-10-14 NOTE — Progress Notes (Signed)
Cardiology Office Note    Date:  10/15/2016  ID:  [name], DOB 01-Nov-1940, MRN [id number] PCP:  Everrett Coombe, MD  Cardiologist: Dr. Radford Pax   Chief Complaint: f/u CHF  History of Present Illness:  [name] is a 76 y.o. female with history of HTN, normal coronary arteries by cath 5956, chronic diastolic CHF, bipolar 1 disorder, CKD stage III, shizoaffective disorder, seizure disorder and thrombocytopenia who presents for 6 month follow-up of CHF.  To recap, she had abnormal stress test in 2015 with subsequent cath 10/2013 showing normal coronaries. Last echo 09/2014 showed EF 55-60%, grade 2 DD. In November of 2017, had episode of possible syncope in the setting of sharp atypical chest pain and on commode - ED workup unremarkable, felt possibly vasovagal (not clear this was true syncope as she never fell off the commode and was sitting upright when she woke up). Last labs showed Cr 1.2, Hgb 10.2, plt 146 (05/2016), and last LDL 90 in 03/2016.  She returns for follow-up overall doing well. She has not had any issues with clinical HF recently. She is not on any diuretic therapy. She has not had any recent edema, chest pain, shortness of breath, orthopnea, weight changes. She does enjoy eating canned tuna and drinks 4 bottles of water per day plus the occasional 20oz ginger ale.   Past Medical History:  Diagnosis Date  . Anemia   . Bipolar 1 disorder ([place])   . Chronic diastolic CHF (congestive heart failure) ([place]) 10/10/2014   LVEDP elevated at time of cath  . CKD (chronic kidney disease), stage III   . Depression   . Gout   . Hypertension   . Normal coronary arteries 2015  . Schizoaffective disorder   . Seizure disorder ([place])   . Thrombocytopenia ([place])   . Vasovagal syncope    a. possible vasovagal syncope in setting of atypical CP 02/2016.    Past Surgical History:  Procedure Laterality Date  . APPENDECTOMY    . CARDIAC CATHETERIZATION  2005   Normal  . CARDIAC  CATHETERIZATION  11/09/2013   Normal  . LEFT HEART CATHETERIZATION WITH CORONARY ANGIOGRAM N/A 11/09/2013   Procedure: LEFT HEART CATHETERIZATION WITH CORONARY ANGIOGRAM;  Surgeon: Leonie Man, MD;  Location: [facility] CATH LAB;  Service: Cardiovascular;  Laterality: N/A;  . TUBAL LIGATION      Current Medications: Current Meds  Medication Sig  . Alum & Mag Hydroxide-Simeth (COMFORT GEL PO) Take 15 mLs by mouth daily as needed (for stomach).   [name] ammonium lactate (AMLACTIN) 12 % cream Apply 1 g topically daily as needed for dry skin.   [name] aspirin EC 81 MG tablet Take 1 tablet (81 mg total) by mouth daily.  . Calcium Citrate-Vitamin D (CALCIUM CITRATE + D3 PO) Take 1 tablet by mouth 3 (three) times daily.   . diclofenac sodium (VOLTAREN) 1 % GEL Apply 2 g topically 2 (two) times daily as needed (pain).  [name] diltiazem (CARDIZEM) 60 MG tablet TAKE 1 TABLET(60 MG) BY MOUTH THREE TIMES DAILY  . divalproex (DEPAKOTE ER) 500 MG 24 hr tablet Take 1 tablet (500 mg total) by mouth daily.  . Multiple Vitamins-Minerals (CENTRUM SILVER ADULT 50+ PO) Take 1 tablet by mouth daily.  . nitroGLYCERIN (NITROSTAT) 0.4 MG SL tablet PLACE 1 TABLET UNDER THE THE TONGUE AS DIRECTED EVERY 5 MINUTES AS NEEDED FOR CHEST PAIN  . pantoprazole (PROTONIX) 40 MG tablet TAKE 1 TABLET BY MOUTH EVERY DAY  .  polyethylene glycol (MIRALAX / GLYCOLAX) packet Take 17 g by mouth daily as needed for mild constipation.  . Polyvinyl Alcohol-Povidone (REFRESH OP) Place 1-2 drops into both eyes at bedtime as needed (dry eyes).   . risperiDONE (RISPERDAL) 2 MG tablet Take 2 mg by mouth at bedtime.   . vitamin C (ASCORBIC ACID) 500 MG tablet Take 500 mg by mouth daily.     Allergies:   Ativan [lorazepam]; Benztropine mesylate; Clonazepam; Vicodin [hydrocodone-acetaminophen]; Lisinopril; and Sulfonamide derivatives   Social History   Social History  . Marital status: Divorced    Spouse name: N/A  . Number of children: N/A  . Years of  education: N/A   Occupational History  . Retired-retail sales Retired    [place] History Main Topics  . Smoking status: Never Smoker  . Smokeless tobacco: Never Used  . Alcohol use No  . Drug use: No  . Sexual activity: Not Currently   Other Topics Concern  . None   Social History Narrative   Health Care POA: Encarnacion Slates, daughter   Emergency Contact: RL Tolten [phone]   End of Life Plan: DNR   Live by her self.   Any pets: none   Diet: Patient has a varied diet of protein, starch, and vegetables.   Exercise: Patient walks and uses stationary bike.   Hobbies: Reading the Bible      Lives in [place], uses medicaid transportation to get to appointments.   Is a Company secretary at [employer].                    Family History:  Family History  Problem Relation Age of Onset  . Heart attack Mother   . Heart disease Mother   . Heart attack Father   . Heart disease Father   . Hypertension Brother     ROS:   Please see the history of present illness.  All other systems are reviewed and otherwise negative.    PHYSICAL EXAM:   VS:  BP 132/64   Pulse 64   Ht 5\' 3"  (1.6 m)   Wt 135 lb 12.8 oz (61.6 kg)   SpO2 98%   BMI 24.06 kg/m   BMI: Body mass index is 24.06 kg/m. GEN: Well nourished, well developed AAF, in no acute distress  HEENT: normocephalic, atraumatic Neck: no JVD, carotid bruits, or masses Cardiac: RRR; no murmurs, rubs, or gallops, no edema  Respiratory:  clear to auscultation bilaterally, normal work of breathing GI: soft, nontender, nondistended, + BS MS: no deformity or atrophy  Skin: warm and dry, no rash Neuro:  Alert and Oriented x 3, Strength and sensation are intact, follows commands Psych: euthymic mood, calm/normal affect  Wt Readings from Last 3 Encounters:  10/15/16 135 lb 12.8 oz (61.6 kg)  06/19/16 136 lb (61.7 kg)  06/06/16 136 lb 6.4 oz (61.9 kg)      Studies/Labs Reviewed:   EKG:  EKG was ordered today and  personally reviewed by me and demonstrates NSR 64bpm, nonspecific TW changes but overall nonacute.  Recent Labs: 03/02/2016: BUN 15; Potassium 4.4; Sodium 139 05/16/2016: Hemoglobin 10.2; Platelets 146 05/21/2016: Creatinine, Ser 1.20   Lipid Panel    Component Value Date/Time   CHOL 154 04/05/2016 1522   TRIG 79 04/05/2016 1522   HDL 48 (L) 04/05/2016 1522   CHOLHDL 3.2 04/05/2016 1522   VLDL 16 04/05/2016 1522   LDLCALC 90 04/05/2016 1522   LDLDIRECT  114 (H) 12/10/2006 2054    Additional studies/ records that were reviewed today include: Summarized above.    ASSESSMENT & PLAN:   1. Chronic diastolic CHF - appears euvolemic. Fortunately has not had any issues with clinical CHF in the meantime. Reviewed 2g sodium, 2L fluid restriction and daily weights.  I told the patient that Dr. Radford Pax may want to check repeat labs at her visit in January 2018 and to come fasting (the patient is not sure who is following her cholesterol, last checked 03/2016). 2. Essential HTN - generally controlled given her age and comorbidities. Continue current regimen. We discussed possibly changing diltiazem to consolidated form for convenience but she does not wish to make any changes. 3. History of syncope - no recurrent CP or syncope, suspected vasovagal. Continue surveillance for any recurrent symptoms. 4. CKD III - recheck Cr earlier this year was comparable to prior.   Disposition: F/u with Dr. Radford Pax as previously recommended in 6 months (04/2017).    Medication Adjustments/Labs and Tests Ordered: Current medicines are reviewed at length with the patient today.  Concerns regarding medicines are outlined above. Medication changes, Labs and Tests ordered today are summarized above and listed in the Patient Instructions accessible in Encounters.   Signed, Charlie Pitter, PA-C  10/15/2016 10:48 AM    [facility] [address], [place], [place]  [address] Phone: [phone]; Fax:  [phone]

## 2016-10-15 ENCOUNTER — Ambulatory Visit (INDEPENDENT_AMBULATORY_CARE_PROVIDER_SITE_OTHER): Payer: Medicare Other | Admitting: Physician Assistant

## 2016-10-15 ENCOUNTER — Encounter: Payer: Self-pay | Admitting: Physician Assistant

## 2016-10-15 VITALS — BP 132/64 | HR 64 | Ht 63.0 in | Wt 135.8 lb

## 2016-10-15 DIAGNOSIS — N183 Chronic kidney disease, stage 3 unspecified: Secondary | ICD-10-CM

## 2016-10-15 DIAGNOSIS — I5032 Chronic diastolic (congestive) heart failure: Secondary | ICD-10-CM

## 2016-10-15 DIAGNOSIS — Z87898 Personal history of other specified conditions: Secondary | ICD-10-CM

## 2016-10-15 DIAGNOSIS — I10 Essential (primary) hypertension: Secondary | ICD-10-CM

## 2016-10-15 NOTE — Patient Instructions (Addendum)
Medication Instructions:  Your physician recommends that you continue on your current medications as directed. Please refer to the Current Medication list given to you today.   Labwork: None ordered  Testing/Procedures: None ordered  Follow-Up: Your physician wants you to follow-up in: [place] DR.  Mallie Snooks will receive a reminder letter in the mail two months in advance. If you don't receive a letter, please call our office to schedule the follow-up appointment.   Any Other Special Instructions Will Be Listed Below (If Applicable).    For patients with congestive heart failure, we give them these special instructions:  1. Follow a low-salt diet - you are allowed no more than 2,000mg  of sodium per day. Watch your fluid intake. In general, you should not be taking in more than 2 liters of fluid per day (no more than 8 glasses per day). This includes sources of water in foods like soup, coffee, tea, milk, etc. 2. Weigh yourself on the same scale at same time of day and keep a log. 3. Call your doctor: (Anytime you feel any of the following symptoms)  - 3lb weight gain overnight or 5lb within a few days - Shortness of breath, with or without a dry hacking cough  - Swelling in the hands, feet or stomach  - If you have to sleep on extra pillows at night in order to breathe   IT IS IMPORTANT TO LET YOUR DOCTOR KNOW EARLY ON IF YOU ARE HAVING SYMPTOMS SO WE CAN HELP YOU!

## 2016-10-30 ENCOUNTER — Other Ambulatory Visit: Payer: Self-pay | Admitting: *Deleted

## 2016-10-30 MED ORDER — POLYETHYLENE GLYCOL 3350 17 G PO PACK: 17.0000 g | PACK | Freq: Every day | ORAL | 6 refills | Status: DC | PRN

## 2016-12-09 DIAGNOSIS — F319 Bipolar disorder, unspecified: Secondary | ICD-10-CM | POA: Diagnosis not present

## 2016-12-27 ENCOUNTER — Ambulatory Visit: Payer: Medicare Other | Admitting: Podiatry

## 2017-01-22 ENCOUNTER — Ambulatory Visit (INDEPENDENT_AMBULATORY_CARE_PROVIDER_SITE_OTHER): Payer: Medicare Other | Admitting: Podiatry

## 2017-01-22 DIAGNOSIS — B351 Tinea unguium: Secondary | ICD-10-CM

## 2017-01-22 DIAGNOSIS — M79675 Pain in left toe(s): Secondary | ICD-10-CM

## 2017-01-22 MED ORDER — AMMONIUM LACTATE 12 % EX CREA: 1.0000 g | TOPICAL_CREAM | CUTANEOUS | 1 refills | Status: DC | PRN

## 2017-01-22 NOTE — Progress Notes (Addendum)
Patient ID: [name], female   DOB: [birth year], 76 y.o.   MRN: [id number] I Visit Type: Patient returns to my office for continued preventative foot care services. Complaint: Patient states" my nails have grown long and thick and become painful to walk and wear shoes  . She presents for preventative foot care services. No changes to ROS She has developed peeling on back of her feet and she says the lac-hydrin is working well.  Podiatric Exam: Vascular: dorsalis pedis and posterior tibial pulses are palpable bilateral. Capillary return is immediate. Temperature gradient is WNL. Skin turgor WNL  Sensorium: Normal Semmes Weinstein monofilament test. Normal tactile sensation bilaterally. Nail Exam: Pt has thick disfigured discolored nails with subungual debris noted bilateral entire nail hallux through fifth toenails Ulcer Exam: There is no evidence of ulcer or pre-ulcerative changes or infection. Orthopedic Exam: Muscle tone and strength are WNL. No limitations in general ROM. No crepitus or effusions noted. Foot type and digits show no abnormalities. Bony prominences are unremarkable.  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 Skin: No Porokeratosis. No infection or ulcers.  Peeling noted at plantar posterior aspect heels  B/L  Diagnosis:  Tinea unguium, Pain in right toe, pain in left toes  Treatment & Plan Procedures and Treatment: Consent by patient was obtained for treatment procedures. The patient understood the discussion of treatment and procedures well. All questions were answered thoroughly reviewed. Debridement of mycotic and hypertrophic toenails, 1 through 5 bilateral and clearing of subungual debris. No ulceration, no infection noted.  Prescribe lac-hydrin. Signed  ABN for 2018 Return Visit-Office Procedure: Patient instructed to return to the office for a follow up visit 3 months for continued evaluation and treatment.  Gardiner Barefoot DPM

## 2017-01-22 NOTE — Addendum Note (Signed)
Addended byDeidre Ala, Jamil Armwood L on: 01/22/2017 08:50 AM   Modules accepted: Orders

## 2017-03-04 ENCOUNTER — Ambulatory Visit: Payer: Self-pay | Admitting: Student

## 2017-03-24 ENCOUNTER — Other Ambulatory Visit: Payer: Self-pay | Admitting: Cardiology

## 2017-04-23 ENCOUNTER — Encounter: Payer: Self-pay | Admitting: Podiatry

## 2017-04-23 ENCOUNTER — Other Ambulatory Visit: Payer: Self-pay | Admitting: Student in an Organized Health Care Education/Training Program

## 2017-04-23 ENCOUNTER — Telehealth: Payer: Self-pay | Admitting: Cardiology

## 2017-04-23 ENCOUNTER — Ambulatory Visit (INDEPENDENT_AMBULATORY_CARE_PROVIDER_SITE_OTHER): Payer: Medicare Other | Admitting: Podiatry

## 2017-04-23 DIAGNOSIS — B351 Tinea unguium: Secondary | ICD-10-CM

## 2017-04-23 DIAGNOSIS — M79676 Pain in unspecified toe(s): Secondary | ICD-10-CM | POA: Diagnosis not present

## 2017-04-23 DIAGNOSIS — M79675 Pain in left toe(s): Principal | ICD-10-CM

## 2017-04-23 MED ORDER — ASPIRIN EC 81 MG PO TBEC: 81.0000 mg | DELAYED_RELEASE_TABLET | Freq: Every day | ORAL | 4 refills | Status: AC

## 2017-04-23 NOTE — Telephone Encounter (Signed)
Left message on mobile number to call back

## 2017-04-23 NOTE — Telephone Encounter (Signed)
Refilled

## 2017-04-23 NOTE — Telephone Encounter (Signed)
New message ° °Pt verbalized that she is calling for RN °

## 2017-04-23 NOTE — Progress Notes (Signed)
Patient ID: [name], female   DOB: 02/11/1941, 77 y.o.   MRN: [id number] I Visit Type: Patient returns to my office for continued preventative foot care services. Complaint: Patient states" my nails have grown long and thick and become painful to walk and wear shoes  . She presents for preventative foot care services. No changes to ROS She has developed peeling on back of her feet and she says the lac-hydrin is working well.  Podiatric Exam: Vascular: dorsalis pedis and posterior tibial pulses are palpable bilateral. Capillary return is immediate. Temperature gradient is WNL. Skin turgor WNL  Sensorium: Normal Semmes Weinstein monofilament test. Normal tactile sensation bilaterally. Nail Exam: Pt has thick disfigured discolored nails with subungual debris noted bilateral entire nail hallux through fifth toenails Ulcer Exam: There is no evidence of ulcer or pre-ulcerative changes or infection. Orthopedic Exam: Muscle tone and strength are WNL. No limitations in general ROM. No crepitus or effusions noted. Foot type and digits show no abnormalities. Bony prominences are unremarkable.  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 Skin: No Porokeratosis. No infection or ulcers.  Peeling noted at plantar posterior aspect heels  B/L  Diagnosis:  Tinea unguium, Pain in right toe, pain in left toes  Treatment & Plan Procedures and Treatment: Consent by patient was obtained for treatment procedures. The patient understood the discussion of treatment and procedures well. All questions were answered thoroughly reviewed. Debridement of mycotic and hypertrophic toenails, 1 through 5 bilateral and clearing of subungual debris. No ulceration, no infection noted.  Prescribe lac-hydrin. Signed  ABN for 2019. Return Visit-Office Procedure: Patient instructed to return to the office for a follow up visit 3 months for continued evaluation and treatment.  Gardiner Barefoot DPM

## 2017-04-23 NOTE — Telephone Encounter (Signed)
Pt informed. Zimmerman Rumple, Sylvan Sookdeo D, CMA  

## 2017-04-23 NOTE — Telephone Encounter (Signed)
Needs dr to authorize refill on her aspirin.  [facility] on [address]

## 2017-04-29 NOTE — Telephone Encounter (Signed)
Left message to call back  

## 2017-04-29 NOTE — Telephone Encounter (Signed)
Patient was calling a medication refill. Patient states pharmacy has filled her prescription. Patient had no other questions or concerns. Patient thanked me for the call

## 2017-04-29 NOTE — Telephone Encounter (Signed)
Follow up'   Patient is returning call to [facility]

## 2017-05-08 DIAGNOSIS — F319 Bipolar disorder, unspecified: Secondary | ICD-10-CM | POA: Diagnosis not present

## 2017-05-08 NOTE — Progress Notes (Signed)
Cardiology Office Note:    Date:  05/09/2017   ID:  [name], DOB [birth year], MRN [id number]  PCP:  Everrett Coombe, MD  Cardiologist:  No primary care provider on file.    Referring MD: Everrett Coombe, MD   Chief Complaint  Patient presents with  . Congestive Heart Failure  . Hypertension    History of Present Illness:    [name] is a 77 y.o. female with a hx of HTN, normal coronary arteries by cath 3976, chronic diastolic CHF, CKD stage III and thrombocytopenia.  She is here today for followup and is doing well.  She denies any chest pain or pressure, PND, orthopnea, LE edema, dizziness, palpitations or syncope. She has chronic DOE which is very mild and only occasionally with walking.  She is compliant with her meds and is tolerating meds with no SE.    Past Medical History:  Diagnosis Date  . Anemia   . Bipolar 1 disorder ([place])   . Chronic diastolic CHF (congestive heart failure) ([place]) 10/10/2014   LVEDP elevated at time of cath  . CKD (chronic kidney disease), stage III ([place])   . Depression   . Gout   . Hypertension   . Normal coronary arteries 2015  . Schizoaffective disorder   . Seizure disorder ([place])   . Thrombocytopenia ([place])   . Vasovagal syncope    a. possible vasovagal syncope in setting of atypical CP 02/2016.    Past Surgical History:  Procedure Laterality Date  . APPENDECTOMY    . CARDIAC CATHETERIZATION  2005   Normal  . CARDIAC CATHETERIZATION  11/09/2013   Normal  . LEFT HEART CATHETERIZATION WITH CORONARY ANGIOGRAM N/A 11/09/2013   Procedure: LEFT HEART CATHETERIZATION WITH CORONARY ANGIOGRAM;  Surgeon: Leonie Man, MD;  Location[facility] C[facility] LAB;  Service: Cardiovascular;  Laterality: N/A;  . TUBAL LIGATION      Current Medications: Current Meds  Medication Sig  . Alum & Mag Hydroxide-Simeth (COMFORT GEL PO) Take 15 mLs by mouth daily as needed (for stomach).   [name] ammonium lactate (AMLACTIN) 12 % cream Apply 1 g topically as needed for dry  skin.  [name] aspirin EC 81 MG tablet Take 1 tablet (81 mg total) by mouth daily.  . Calcium Citrate-Vitamin D (CALCIUM CITRATE + D3 PO) Take 1 tablet by mouth 3 (three) times daily.   . diclofenac sodium (VOLTAREN) 1 % GEL Apply 2 g topically 2 (two) times daily as needed (pain).  [name] diltiazem (CARDIZEM) 60 MG tablet TAKE 1 TABLET(60 MG) BY MOUTH THREE TIMES DAILY  . divalproex (DEPAKOTE ER) 500 MG 24 hr tablet Take 1 tablet (500 mg total) by mouth daily.  . Multiple Vitamins-Minerals (CENTRUM SILVER ADULT 50+ PO) Take 1 tablet by mouth daily.  . nitroGLYCERIN (NITROSTAT) 0.4 MG SL tablet PLACE 1 TABLET UNDER THE THE TONGUE AS DIRECTED EVERY 5 MINUTES AS NEEDED FOR CHEST PAIN  . pantoprazole (PROTONIX) 40 MG tablet TAKE 1 TABLET BY MOUTH EVERY DAY  . polyethylene glycol (MIRALAX / GLYCOLAX) packet Take 17 g by mouth daily as needed for mild constipation.  . Polyvinyl Alcohol-Povidone (REFRESH OP) Place 1-2 drops into both eyes at bedtime as needed (dry eyes).   . risperiDONE (RISPERDAL) 2 MG tablet Take 2 mg by mouth at bedtime.   . vitamin C (ASCORBIC ACID) 500 MG tablet Take 500 mg by mouth daily.     Allergies:   Ativan [lorazepam]; Benztropine mesylate; Clonazepam; Vicodin [hydrocodone-acetaminophen]; Lisinopril; and  Sulfonamide derivatives   Social History   Socioeconomic History  . Marital status: Divorced    Spouse name: None  . Number of children: None  . Years of education: None  . Highest education level: None  Social Needs  . Financial resource strain: None  . Food insecurity - worry: None  . Food insecurity - inability: None  . Transportation needs - medical: None  . Transportation needs - non-medical: None  Occupational History  . Occupation: Retired-retail Scientist, clinical (histocompatibility and immunogenetics): RETIRED    Comment: [employer]  Tobacco Use  . Smoking status: Never Smoker  . Smokeless tobacco: Never Used  Substance and Sexual Activity  . Alcohol use: No  . Drug use: No  . Sexual activity:  Not Currently  Other Topics Concern  . None  Social History Narrative   Health Care POA: Encarnacion Slates, daughter   Emergency Contact: RL Tolten [phone]   End of Life Plan: DNR   Live by her self.   Any pets: none   Diet: Patient has a varied diet of protein, starch, and vegetables.   Exercise: Patient walks and uses stationary bike.   Hobbies: Reading the Bible      Lives in [place], uses medicaid transportation to get to appointments.   Is a Company secretary at  [employer].                    Family History: The patient's family history includes Heart attack in her father and mother; Heart disease in her father and mother; Hypertension in her brother.  ROS:   Please see the history of present illness.    ROS  All other systems reviewed and negative.   EKGs/Labs/Other Studies Reviewed:    The following studies were reviewed today: none  EKG:  EKG is not ordered today.   Recent Labs: 05/16/2016: Hemoglobin 10.2; Platelets 146 05/21/2016: Creatinine, Ser 1.20   Recent Lipid Panel    Component Value Date/Time   CHOL 154 04/05/2016 1522   TRIG 79 04/05/2016 1522   HDL 48 (L) 04/05/2016 1522   CHOLHDL 3.2 04/05/2016 1522   VLDL 16 04/05/2016 1522   LDLCALC 90 04/05/2016 1522   LDLDIRECT 114 (H) 12/10/2006 2054    Physical Exam:    VS:  BP 130/70   Pulse 85   Ht 5\' 3"  (1.6 m)   Wt 138 lb 3.2 oz (62.7 kg)   SpO2 97%   BMI 24.48 kg/m     Wt Readings from Last 3 Encounters:  05/09/17 138 lb 3.2 oz (62.7 kg)  10/15/16 135 lb 12.8 oz (61.6 kg)  06/19/16 136 lb (61.7 kg)     GEN:  Well nourished, well developed in no acute distress HEENT: Normal NECK: No JVD; No carotid bruits LYMPHATICS: No lymphadenopathy CARDIAC: RRR, no murmurs, rubs, gallops RESPIRATORY:  Clear to auscultation without rales, wheezing or rhonchi  ABDOMEN: Soft, non-tender, non-distended MUSCULOSKELETAL:  No edema; No deformity  SKIN: Warm and dry NEUROLOGIC:  Alert and oriented x  3 PSYCHIATRIC:  Normal affect   ASSESSMENT:    1. Chronic diastolic CHF (congestive heart failure) ([place])   2. Essential hypertension   3. Vasovagal syncope    PLAN:    In order of problems listed above:  1.  Chronic diastolic CHF - she appears euvolemic on exam today and weight is stable.  She has not required diuretics recently.    2.  HTN - BP is well controlled  on exam.  She will continue on Cardizem 60mg  TID.  3.  Vasovagal syncope - she has not had any further episodes of presyncope or syncope.     Medication Adjustments/Labs and Tests Ordered: Current medicines are reviewed at length with the patient today.  Concerns regarding medicines are outlined above.  No orders of the defined types were placed in this encounter.  No orders of the defined types were placed in this encounter.   Signed, Fransico Him, MD  05/09/2017 9:00 AM    [place]

## 2017-05-09 ENCOUNTER — Encounter: Payer: Self-pay | Admitting: Cardiology

## 2017-05-09 ENCOUNTER — Ambulatory Visit (INDEPENDENT_AMBULATORY_CARE_PROVIDER_SITE_OTHER): Payer: Medicare Other | Admitting: Cardiology

## 2017-05-09 VITALS — BP 130/70 | HR 85 | Ht 63.0 in | Wt 138.2 lb

## 2017-05-09 DIAGNOSIS — I5032 Chronic diastolic (congestive) heart failure: Secondary | ICD-10-CM

## 2017-05-09 DIAGNOSIS — I10 Essential (primary) hypertension: Secondary | ICD-10-CM | POA: Diagnosis not present

## 2017-05-09 DIAGNOSIS — R55 Syncope and collapse: Secondary | ICD-10-CM | POA: Diagnosis not present

## 2017-05-09 NOTE — Patient Instructions (Signed)

## 2017-05-14 ENCOUNTER — Ambulatory Visit: Payer: Self-pay | Admitting: Student in an Organized Health Care Education/Training Program

## 2017-05-27 ENCOUNTER — Emergency Department (HOSPITAL_COMMUNITY): Payer: Medicare Other

## 2017-05-27 ENCOUNTER — Encounter (HOSPITAL_COMMUNITY): Payer: Self-pay | Admitting: Emergency Medicine

## 2017-05-27 ENCOUNTER — Emergency Department (HOSPITAL_COMMUNITY)
Admission: EM | Admit: 2017-05-27 | Discharge: 2017-05-27 | Disposition: A | Discharge status: Home / Self Care | Payer: Medicare Other | Attending: Emergency Medicine | Admitting: Emergency Medicine

## 2017-05-27 ENCOUNTER — Other Ambulatory Visit: Payer: Self-pay

## 2017-05-27 DIAGNOSIS — R1011 Right upper quadrant pain: Secondary | ICD-10-CM | POA: Diagnosis not present

## 2017-05-27 DIAGNOSIS — R0781 Pleurodynia: Secondary | ICD-10-CM | POA: Diagnosis not present

## 2017-05-27 DIAGNOSIS — I5032 Chronic diastolic (congestive) heart failure: Secondary | ICD-10-CM | POA: Diagnosis not present

## 2017-05-27 DIAGNOSIS — I130 Hypertensive heart and chronic kidney disease with heart failure and stage 1 through stage 4 chronic kidney disease, or unspecified chronic kidney disease: Secondary | ICD-10-CM | POA: Diagnosis not present

## 2017-05-27 DIAGNOSIS — R109 Unspecified abdominal pain: Secondary | ICD-10-CM | POA: Diagnosis not present

## 2017-05-27 DIAGNOSIS — Z79899 Other long term (current) drug therapy: Secondary | ICD-10-CM | POA: Insufficient documentation

## 2017-05-27 DIAGNOSIS — R05 Cough: Secondary | ICD-10-CM | POA: Diagnosis not present

## 2017-05-27 DIAGNOSIS — R079 Chest pain, unspecified: Secondary | ICD-10-CM | POA: Diagnosis not present

## 2017-05-27 DIAGNOSIS — N183 Chronic kidney disease, stage 3 (moderate): Secondary | ICD-10-CM | POA: Insufficient documentation

## 2017-05-27 LAB — COMPREHENSIVE METABOLIC PANEL
ALT: 77 U/L — ABNORMAL HIGH (ref 14–54)
AST: 63 U/L — ABNORMAL HIGH (ref 15–41)
Albumin: 3.3 g/dL — ABNORMAL LOW (ref 3.5–5.0)
Alkaline Phosphatase: 105 U/L (ref 38–126)
Anion gap: 12 (ref 5–15)
BILIRUBIN TOTAL: 0.7 mg/dL (ref 0.3–1.2)
BUN: 17 mg/dL (ref 6–20)
CALCIUM: 10 mg/dL (ref 8.9–10.3)
CO2: 23 mmol/L (ref 22–32)
Chloride: 105 mmol/L (ref 101–111)
Creatinine, Ser: 1.23 mg/dL — ABNORMAL HIGH (ref 0.44–1.00)
GFR calc Af Amer: 48 mL/min — ABNORMAL LOW (ref >60)
GFR, EST NON AFRICAN AMERICAN: 41 mL/min — AB (ref >60)
Glucose, Bld: 92 mg/dL (ref 65–99)
POTASSIUM: 4.3 mmol/L (ref 3.5–5.1)
Sodium: 140 mmol/L (ref 135–145)
TOTAL PROTEIN: 6.7 g/dL (ref 6.5–8.1)

## 2017-05-27 LAB — URINALYSIS, COMPLETE (UACMP) WITH MICROSCOPIC
Bacteria, UA: NONE SEEN
Bilirubin Urine: NEGATIVE
GLUCOSE, UA: NEGATIVE mg/dL
Hgb urine dipstick: NEGATIVE
KETONES UR: NEGATIVE mg/dL
LEUKOCYTES UA: NEGATIVE
NITRITE: NEGATIVE
PH: 7 (ref 5.0–8.0)
Protein, ur: 30 mg/dL — AB
SPECIFIC GRAVITY, URINE: 1.01 (ref 1.005–1.030)

## 2017-05-27 LAB — CBC WITH DIFFERENTIAL/PLATELET
Basophils Absolute: 0 10*3/uL (ref 0.0–0.1)
Basophils Relative: 0 %
Eosinophils Absolute: 0 10*3/uL (ref 0.0–0.7)
Eosinophils Relative: 0 %
HEMATOCRIT: 32.6 % — AB (ref 36.0–46.0)
Hemoglobin: 10.5 g/dL — ABNORMAL LOW (ref 12.0–15.0)
LYMPHS ABS: 1.1 10*3/uL (ref 0.7–4.0)
LYMPHS PCT: 21 %
MCH: 25.5 pg — ABNORMAL LOW (ref 26.0–34.0)
MCHC: 32.2 g/dL (ref 30.0–36.0)
MCV: 79.3 fL (ref 78.0–100.0)
MONO ABS: 0.7 10*3/uL (ref 0.1–1.0)
MONOS PCT: 13 %
NEUTROS ABS: 3.4 10*3/uL (ref 1.7–7.7)
Neutrophils Relative %: 66 %
Platelets: 204 10*3/uL (ref 150–400)
RBC: 4.11 MIL/uL (ref 3.87–5.11)
RDW: 17.2 % — AB (ref 11.5–15.5)
WBC: 5.2 10*3/uL (ref 4.0–10.5)

## 2017-05-27 LAB — I-STAT TROPONIN, ED: Troponin i, poc: 0 ng/mL (ref 0.00–0.08)

## 2017-05-27 LAB — BRAIN NATRIURETIC PEPTIDE: B NATRIURETIC PEPTIDE 5: 72.3 pg/mL (ref 0.0–100.0)

## 2017-05-27 LAB — LIPASE, BLOOD: Lipase: 27 U/L (ref 11–51)

## 2017-05-27 MED ORDER — OXYCODONE HCL 5 MG PO TABS: 2.5000 mg | ORAL_TABLET | Freq: Four times a day (QID) | ORAL | 0 refills | Status: AC | PRN

## 2017-05-27 MED ORDER — MORPHINE SULFATE (PF) 4 MG/ML IV SOLN
2.0000 mg | Freq: Once | INTRAVENOUS | Status: AC
  Administered 2017-05-27: 2 mg via INTRAVENOUS
  Filled 2017-05-27: qty 1

## 2017-05-27 MED ORDER — ONDANSETRON HCL 4 MG/2ML IJ SOLN
4.0000 mg | Freq: Once | INTRAMUSCULAR | Status: AC
  Administered 2017-05-27: 4 mg via INTRAVENOUS
  Filled 2017-05-27: qty 2

## 2017-05-27 MED ORDER — IOPAMIDOL (ISOVUE-370) INJECTION 76%
INTRAVENOUS | Status: AC
  Administered 2017-05-27: 80 mL
  Filled 2017-05-27: qty 100

## 2017-05-27 MED ORDER — MORPHINE SULFATE (PF) 4 MG/ML IV SOLN
4.0000 mg | Freq: Once | INTRAVENOUS | Status: AC
  Administered 2017-05-27: 4 mg via INTRAVENOUS
  Filled 2017-05-27: qty 1

## 2017-05-27 NOTE — ED Notes (Signed)
ED Provider at bedside. 

## 2017-05-27 NOTE — ED Provider Notes (Signed)
[facility] EMERGENCY DEPARTMENT Provider Note   CSN: [id number] Arrival date & time: 05/27/17  2595     History   Chief Complaint Chief Complaint  Patient presents with  . Cough  . Chest Pain    HPI [name] is a 77 y.o. female.  HPI   [name] is a 77 y.o. female, with a history of CKD, schizoaffective disorder, seizures, CHF, HTN, presenting to the ED with right lower chest and right upper abdominal pain for the last week.  Pain is constant, sharp, 10/10, radiating laterally.  Accompanied by a nonproductive cough.  Worse with deep breathing or palpation. Pain arose before the cough.  Denies fever/chills, N/V/D, falls/trauma, shortness of breath, hematochezia/melena, hematuria/dysuria, hemoptysis, or any other complaints.   Past Medical History:  Diagnosis Date  . Anemia   . Bipolar 1 disorder ([place])   . Chronic diastolic CHF (congestive heart failure) ([place]) 10/10/2014   LVEDP elevated at time of cath  . CKD (chronic kidney disease), stage III ([place])   . Depression   . Gout   . Hypertension   . Normal coronary arteries 2015  . Schizoaffective disorder   . Seizure disorder ([place])   . Thrombocytopenia ([place])   . Vasovagal syncope    a. possible vasovagal syncope in setting of atypical CP 02/2016.    Patient Active Problem List   Diagnosis Date Noted  . Hematuria 07/15/2015  . URI (upper respiratory infection) 04/30/2015  . Hyperglycemia 02/19/2015  . Pre-syncope 02/04/2015  . Syncope 02/03/2015  . Chronic diastolic CHF (congestive heart failure) ([place]) 10/10/2014  . Palpitations 10/10/2014  . Chest pain, atypical 12/30/2013  . GERD (gastroesophageal reflux disease) 12/30/2013  . Normal coronary arteries 11/09/13 11/10/2013  . Localized swelling of lower leg (foot) 10/07/2013  . Abnormality of gait 06/12/2010  . SCHIZOAFFECTIVE DISORDER 11/28/2009  . KNEE PAIN, RIGHT, CHRONIC 10/25/2009  . ANEMIA, NORMOCYTIC, CHRONIC 11/07/2008  . GOUT  05/25/2008  . CHRONIC KIDNEY DISEASE STAGE III (MODERATE) 08/27/2007  . HYPERCHOLESTEROLEMIA 06/12/2006  . BIPOLAR DISORDER 06/12/2006  . Essential hypertension 06/12/2006  . Convulsions/seizures ([place]) 06/12/2006    Past Surgical History:  Procedure Laterality Date  . APPENDECTOMY    . CARDIAC CATHETERIZATION  2005   Normal  . CARDIAC CATHETERIZATION  11/09/2013   Normal  . LEFT HEART CATHETERIZATION WITH CORONARY ANGIOGRAM N/A 11/09/2013   Procedure: LEFT HEART CATHETERIZATION WITH CORONARY ANGIOGRAM;  Surgeon: Leonie Man, MD;  Location: [facility] CATH LAB;  Service: Cardiovascular;  Laterality: N/A;  . TUBAL LIGATION      OB History    No data available       Home Medications    Prior to Admission medications   Medication Sig Start Date End Date Taking? Authorizing Provider  Alum & Mag Hydroxide-Simeth (COMFORT GEL PO) Take 15 mLs by mouth daily as needed (for stomach).    Yes [provider]  ammonium lactate (AMLACTIN) 12 % cream Apply 1 g topically as needed for dry skin. 01/22/17  Yes Gardiner Barefoot, DPM  aspirin EC 81 MG tablet Take 1 tablet (81 mg total) by mouth daily. 04/23/17  Yes Everrett Coombe, MD  Calcium Citrate-Vitamin D (CALCIUM CITRATE + D3 PO) Take 1 tablet by mouth 3 (three) times daily.    Yes [provider]  diclofenac sodium (VOLTAREN) 1 % GEL Apply 2 g topically 2 (two) times daily as needed (pain).   Yes [provider]  diltiazem (CARDIZEM) 60 MG  tablet TAKE 1 TABLET(60 MG) BY MOUTH THREE TIMES DAILY 03/25/17  Yes Turner, Eber Hong, MD  divalproex (DEPAKOTE ER) 500 MG 24 hr tablet Take 1 tablet (500 mg total) by mouth daily. 01/18/13  Yes Hilton Sinclair, MD  Multiple Vitamins-Minerals (CENTRUM SILVER ADULT 50+ PO) Take 1 tablet by mouth daily.   Yes [provider]  pantoprazole (PROTONIX) 40 MG tablet TAKE 1 TABLET BY MOUTH EVERY DAY 07/03/16  Yes Rumley, Fajardo N, DO  polyethylene glycol (MIRALAX / GLYCOLAX) packet  Take 17 g by mouth daily as needed for mild constipation. 10/30/16  Yes Everrett Coombe, MD  Polyvinyl Alcohol-Povidone (REFRESH OP) Place 1-2 drops into both eyes at bedtime as needed (dry eyes).    Yes [provider]  risperiDONE (RISPERDAL) 2 MG tablet Take 2 mg by mouth at bedtime.    Yes [provider]  vitamin C (ASCORBIC ACID) 500 MG tablet Take 500 mg by mouth daily.   Yes [provider]  nitroGLYCERIN (NITROSTAT) 0.4 MG SL tablet PLACE 1 TABLET UNDER THE THE TONGUE AS DIRECTED EVERY 5 MINUTES AS NEEDED FOR CHEST PAIN 04/05/16   Rumley, Sterling N, DO  oxyCODONE (ROXICODONE) 5 MG immediate release tablet Take 0.5 tablets (2.5 mg total) by mouth every 6 (six) hours as needed for severe pain. 05/27/17   Chesni Vos, Helane Gunther, PA-C    Family History Family History  Problem Relation Age of Onset  . Heart attack Mother   . Heart disease Mother   . Heart attack Father   . Heart disease Father   . Hypertension Brother     Social History Social History   Tobacco Use  . Smoking status: Never Smoker  . Smokeless tobacco: Never Used  Substance Use Topics  . Alcohol use: No  . Drug use: No     Allergies   Ativan [lorazepam]; Benztropine mesylate; Clonazepam; Vicodin [hydrocodone-acetaminophen]; Lisinopril; and Sulfonamide derivatives   Review of Systems Review of Systems  Constitutional: Negative for appetite change and fever.  Respiratory: Positive for cough. Negative for shortness of breath.   Cardiovascular: Positive for chest pain. Negative for palpitations and leg swelling.  Gastrointestinal: Positive for abdominal pain. Negative for diarrhea, nausea and vomiting.  Genitourinary: Negative for dysuria.  Musculoskeletal: Negative for back pain.  Neurological: Negative for dizziness, weakness, light-headedness and headaches.  All other systems reviewed and are negative.    Physical Exam Updated Vital Signs BP 124/71 (BP Location: Left Arm)   Pulse 87    Temp 98.7 F (37.1 C) (Oral)   Resp 16   SpO2 99%   Physical Exam  Constitutional: She appears well-developed and well-nourished. No distress.  HENT:  Head: Normocephalic and atraumatic.  Eyes: Conjunctivae are normal.  Neck: Neck supple.  Cardiovascular: Normal rate, regular rhythm, normal heart sounds and intact distal pulses.  Pulmonary/Chest: Effort normal and breath sounds normal. No respiratory distress. She exhibits tenderness.  Abdominal: Soft. There is tenderness. There is no guarding.    Musculoskeletal: She exhibits no edema.  Lymphadenopathy:    She has no cervical adenopathy.  Neurological: She is alert.  Skin: Skin is warm and dry. She is not diaphoretic.  Psychiatric: She has a normal mood and affect. Her behavior is normal.  Nursing note and vitals reviewed.    ED Treatments / Results  Labs (all labs ordered are listed, but only abnormal results are displayed) Labs Reviewed  CBC WITH DIFFERENTIAL/PLATELET - Abnormal; Notable for the following components:  Result Value   Hemoglobin 10.5 (*)    HCT 32.6 (*)    MCH 25.5 (*)    RDW 17.2 (*)    All other components within normal limits  COMPREHENSIVE METABOLIC PANEL - Abnormal; Notable for the following components:   Creatinine, Ser 1.23 (*)    Albumin 3.3 (*)    AST 63 (*)    ALT 77 (*)    GFR calc non Af Amer 41 (*)    GFR calc Af Amer 48 (*)    All other components within normal limits  URINALYSIS, COMPLETE (UACMP) WITH MICROSCOPIC - Abnormal; Notable for the following components:   Protein, ur 30 (*)    Squamous Epithelial / LPF 0-5 (*)    All other components within normal limits  LIPASE, BLOOD  BRAIN NATRIURETIC PEPTIDE  I-STAT TROPONIN, ED    EKG  EKG Interpretation  Date/Time:  Tuesday May 27 2017 08:40:37 EST Ventricular Rate:  86 PR Interval:    QRS Duration: 75 QT Interval:  333 QTC Calculation: 399 R Axis:   22 Text Interpretation:  Sinus rhythm Probable left atrial  enlargement Minimal ST elevation, anterior leads No significant change since last tracing Confirmed by Addison Lank [phone]) on 05/27/2017 9:12:32 AM       Radiology Dg Ribs Unilateral W/chest Right  Result Date: 05/27/2017 CLINICAL DATA:  Productive cough and right lower rib pain for 1 week. No injury. EXAM: RIGHT RIBS AND CHEST - 3+ VIEW COMPARISON:  Chest x-ray dated March 02, 2016. FINDINGS: No fracture or other bone lesions are seen involving the ribs. There is no evidence of pneumothorax or pleural effusion. Both lungs are clear. Heart size and mediastinal contours are within normal limits. IMPRESSION: Negative. Electronically Signed   By: Titus Dubin M.D.   On: 05/27/2017 09:01   Ct Angio Chest Pe W And/or Wo Contrast  Result Date: 05/27/2017 CLINICAL DATA:  RIGHT upper quadrant pain and tenderness for 1 week, history CHF, hypertension, GERD EXAM: CT ANGIOGRAPHY CHEST CT ABDOMEN AND PELVIS WITH CONTRAST TECHNIQUE: Multidetector CT imaging of the chest was performed using the standard protocol during bolus administration of intravenous contrast. Multiplanar CT image reconstructions and MIPs were obtained to evaluate the vascular anatomy. Multidetector CT imaging of the abdomen and pelvis was performed using the standard protocol during bolus administration of intravenous contrast. CONTRAST:  72mL ISOVUE-370 IOPAMIDOL (ISOVUE-370) INJECTION 76% IV. No oral contrast. COMPARISON:  CT abdomen and pelvis 04/02/2016, MR abdomen 05/21/2016 FINDINGS: CTA CHEST FINDINGS Cardiovascular: Atherosclerotic calcifications aorta, proximal great vessels and coronary arteries. Ascending thoracic aorta normal caliber. No aortic aneurysm or dissection. Pulmonary arteries well opacified and patent. No evidence of pulmonary embolism. No pericardial effusion. Mediastinum/Nodes: Esophagus unremarkable. Base of cervical region normal appearance. No thoracic adenopathy. Lungs/Pleura: Dependent atelectasis in the  posterior lungs. 4 mm LEFT lower lobe nodule subpleural image 66. No definite infiltrate, pleural effusion or pneumothorax. Musculoskeletal: Unremarkable Review of the MIP images confirms the above findings. CT ABDOMEN and PELVIS FINDINGS Hepatobiliary: Large mass lesion with scattered irregular enhancement and a central scar again identified in superior aspect RIGHT lobe liver extending to dome, lesion measuring 12.7 x 9.9 x 12.4 cm (previously 9.1 x 8.6 x 8.6 cm on 05/21/2016), per prior MR report consistent with known hepatocellular carcinoma. No new liver lesions. Single tiny dependent calcified gallstone in gallbladder. No biliary dilatation. Pancreas: Normal appearance Spleen: Normal appearance Adrenals/Urinary Tract: Adrenal glands normal appearance. Ectopic RIGHT kidney located anteriorly in the RIGHT mid  abdomen rather than in renal fossa. Small renal cysts. Kidneys otherwise normal appearance. Bladder and ureters unremarkable. Stomach/Bowel: Appendix surgically absent by history. Large and small bowel loops unremarkable for technique. Gastric wall appears diffusely thickened though stomach is under distended. Vascular/Lymphatic: Atherosclerotic calcifications aorta without aneurysm. No adenopathy. Reproductive: Unremarkable Other: No free air or free fluid. No hernia or acute inflammatory process. Musculoskeletal: Demineralized. Review of the MIP images confirms the above findings. IMPRESSION: No evidence of pulmonary embolism. Bibasilar atelectasis dependently. 4 mm LEFT lower lobe subpleural nodule unchanged when accounting for atelectasis. Interval increase in size of known RIGHT lobe liver neoplasm. Questionable gastric wall thickening versus artifact from underdistention; gastric pathology is not excluded with this appearance and follow-up assessment by endoscopy or upper GI exam recommended. Aortic Atherosclerosis (ICD10-I70.0). Electronically Signed   By: Lavonia Dana M.D.   On: 05/27/2017 12:09    Ct Abdomen Pelvis W Contrast  Result Date: 05/27/2017 CLINICAL DATA:  RIGHT upper quadrant pain and tenderness for 1 week, history CHF, hypertension, GERD EXAM: CT ANGIOGRAPHY CHEST CT ABDOMEN AND PELVIS WITH CONTRAST TECHNIQUE: Multidetector CT imaging of the chest was performed using the standard protocol during bolus administration of intravenous contrast. Multiplanar CT image reconstructions and MIPs were obtained to evaluate the vascular anatomy. Multidetector CT imaging of the abdomen and pelvis was performed using the standard protocol during bolus administration of intravenous contrast. CONTRAST:  58mL ISOVUE-370 IOPAMIDOL (ISOVUE-370) INJECTION 76% IV. No oral contrast. COMPARISON:  CT abdomen and pelvis 04/02/2016, MR abdomen 05/21/2016 FINDINGS: CTA CHEST FINDINGS Cardiovascular: Atherosclerotic calcifications aorta, proximal great vessels and coronary arteries. Ascending thoracic aorta normal caliber. No aortic aneurysm or dissection. Pulmonary arteries well opacified and patent. No evidence of pulmonary embolism. No pericardial effusion. Mediastinum/Nodes: Esophagus unremarkable. Base of cervical region normal appearance. No thoracic adenopathy. Lungs/Pleura: Dependent atelectasis in the posterior lungs. 4 mm LEFT lower lobe nodule subpleural image 66. No definite infiltrate, pleural effusion or pneumothorax. Musculoskeletal: Unremarkable Review of the MIP images confirms the above findings. CT ABDOMEN and PELVIS FINDINGS Hepatobiliary: Large mass lesion with scattered irregular enhancement and a central scar again identified in superior aspect RIGHT lobe liver extending to dome, lesion measuring 12.7 x 9.9 x 12.4 cm (previously 9.1 x 8.6 x 8.6 cm on 05/21/2016), per prior MR report consistent with known hepatocellular carcinoma. No new liver lesions. Single tiny dependent calcified gallstone in gallbladder. No biliary dilatation. Pancreas: Normal appearance Spleen: Normal appearance  Adrenals/Urinary Tract: Adrenal glands normal appearance. Ectopic RIGHT kidney located anteriorly in the RIGHT mid abdomen rather than in renal fossa. Small renal cysts. Kidneys otherwise normal appearance. Bladder and ureters unremarkable. Stomach/Bowel: Appendix surgically absent by history. Large and small bowel loops unremarkable for technique. Gastric wall appears diffusely thickened though stomach is under distended. Vascular/Lymphatic: Atherosclerotic calcifications aorta without aneurysm. No adenopathy. Reproductive: Unremarkable Other: No free air or free fluid. No hernia or acute inflammatory process. Musculoskeletal: Demineralized. Review of the MIP images confirms the above findings. IMPRESSION: No evidence of pulmonary embolism. Bibasilar atelectasis dependently. 4 mm LEFT lower lobe subpleural nodule unchanged when accounting for atelectasis. Interval increase in size of known RIGHT lobe liver neoplasm. Questionable gastric wall thickening versus artifact from underdistention; gastric pathology is not excluded with this appearance and follow-up assessment by endoscopy or upper GI exam recommended. Aortic Atherosclerosis (ICD10-I70.0). Electronically Signed   By: Lavonia Dana M.D.   On: 05/27/2017 12:09    Procedures Procedures (including critical care time)  Medications Ordered in ED Medications  morphine 4 MG/ML injection 2 mg (2 mg Intravenous Given 05/27/17 0909)  ondansetron (ZOFRAN) injection 4 mg (4 mg Intravenous Given 05/27/17 0907)  iopamidol (ISOVUE-370) 76 % injection (80 mLs  Contrast Given 05/27/17 1106)  morphine 4 MG/ML injection 4 mg (4 mg Intravenous Given 05/27/17 1244)     Initial Impression / Assessment and Plan / ED Course  I have reviewed the triage vital signs and the nursing notes.  Pertinent labs & imaging results that were available during my care of the patient were reviewed by me and considered in my medical decision making (see chart for details).  Clinical  Course as of May 28 1551  Tue May 27, 2017  1410 Discussed imaging results with patient. When I mentioned the liver mass, she states, "I am well aware of that. They've talked to me about it before. I know it will get worse and will most likely kill me. I'm 77 years old and I'm ok with that. I don't want any treatment for it and don't want to speak with the cancer doctor. I just need something for this pain."  [SJ]    Clinical Course User Index [SJ] Soliyana Mcchristian C, PA-C    Patient presents with right upper quadrant abdominal and right lower chest pain with associated tenderness.  Right upper quadrant abdominal pathology as well as lung pathologies, such as PE, were included in the initial differential. Patient is nontoxic appearing, afebrile, not tachycardic, not tachypneic, not hypotensive, maintains excellent SPO2 on room air.  Worsening of her hepatic mass was noted on CT scan.  Patient has plans to follow-up with her PCP later this week.  Her allergy list has a note for Vicodin but states she was told not to take this medication due to increased risk for falls.  This was discussed with the patient.  Shared decision-making was used.  Patient opts for narcotic pain management, despite the possible fall risk.   Findings and plan of care discussed with Addison Lank, MD.   Vitals:   05/27/17 0930 05/27/17 1000 05/27/17 1030 05/27/17 1130  BP: (!) 150/70 (!) 161/64 (!) 145/71   Pulse: 72 68 66 78  Resp: (!) 21 (!) 24 16 18   Temp:      TempSrc:      SpO2: 100% 100% 98% 100%   Vitals:   05/27/17 1245 05/27/17 1315 05/27/17 1345 05/27/17 1400  BP: (!) 153/68 (!) 146/65 (!) 151/65 (!) 151/65  Pulse: 67 70 73 77  Resp: 13 (!) 21 (!) 21 14  Temp:      TempSrc:      SpO2: 100% 99% 99% 99%     Final Clinical Impressions(s) / ED Diagnoses   Final diagnoses:  Right upper quadrant abdominal pain    ED Discharge Orders        Ordered    oxyCODONE (ROXICODONE) 5 MG immediate release  tablet  Every 6 hours PRN     05/27/17 [place], Azure Budnick C, PA-C 05/27/17 1553    Fatima Blank, MD 05/31/17 1825

## 2017-05-27 NOTE — ED Notes (Signed)
Patient transported to CT 

## 2017-05-27 NOTE — ED Triage Notes (Signed)
Pt arrives from home via [facility] c/o productive cough and R lower rib pain x 1 week. Pt denies injury, falls, denies n/v/d, fever/chills, hemoptysis.  Resp shallow.

## 2017-05-27 NOTE — Discharge Instructions (Signed)
The mass on your liver, consistent with cancer, has increased in size since last year.  This may very well be what is causing your pain. May use the oxycodone, as needed, for pain relief.  Use caution as this medication can increase drowsiness and the possibility for falls.  Do not drive or perform other dangerous activities while taking the oxycodone. Avoid Tylenol due to the liver mass. Follow-up with your primary care provider as soon as possible for chronic management of this issue.   Should you change your mind about speaking with an oncologist (cancer doctor), please make your primary care provider aware and they will set you up with the correct individual.

## 2017-05-30 ENCOUNTER — Telehealth: Payer: Self-pay | Admitting: Student in an Organized Health Care Education/Training Program

## 2017-05-30 NOTE — Telephone Encounter (Signed)
Pt was diagnosed with cancer in ED 05-27-17. She was given oxycodone.  It is too strong for her. She would like something milder.  The pharmacy is [facility] [address].  Please advise

## 2017-06-02 NOTE — Telephone Encounter (Signed)
Can we have the patient come in for an office visit? Ibuprofen PRN for pain for now until she is seen and we can discuss goals of care, etc.

## 2017-06-03 NOTE — Telephone Encounter (Signed)
Contacted pt and gave her the below information and she said that she has an appointment scheduled for next Tuesday 2/26 to see the doctor. [name], [name], [place]

## 2017-06-10 ENCOUNTER — Ambulatory Visit (INDEPENDENT_AMBULATORY_CARE_PROVIDER_SITE_OTHER): Payer: Medicare Other | Admitting: Student in an Organized Health Care Education/Training Program

## 2017-06-10 ENCOUNTER — Other Ambulatory Visit: Payer: Self-pay

## 2017-06-10 ENCOUNTER — Encounter: Payer: Self-pay | Admitting: Student in an Organized Health Care Education/Training Program

## 2017-06-10 VITALS — BP 128/64 | HR 88 | Temp 98.1°F | Ht 63.0 in | Wt 135.8 lb

## 2017-06-10 DIAGNOSIS — C220 Liver cell carcinoma: Secondary | ICD-10-CM

## 2017-06-10 DIAGNOSIS — E785 Hyperlipidemia, unspecified: Secondary | ICD-10-CM

## 2017-06-10 DIAGNOSIS — W19XXXA Unspecified fall, initial encounter: Secondary | ICD-10-CM

## 2017-06-10 DIAGNOSIS — F319 Bipolar disorder, unspecified: Secondary | ICD-10-CM | POA: Diagnosis not present

## 2017-06-10 NOTE — Progress Notes (Signed)
CC: ED follow up  HPI: [name] is a 77 y.o. female with PMH significant for CHF, HTN, GERD, HLD, bipolar d/o who presents to [facility] today for ED follow up.  Mechanical fall Patient reports that 2 days ago she had a mechanical fall.  She reports that she went to sit down, and missed the chair.  She fell into a row of water cooler water bottles.  She hit her back on the water bottles and injured her foot on the edge of her sandal.  She denies prodrome symptoms, no palpitations or dizziness, no loss of consciousness.  She denies hitting her head with this fall.  She reports abrasions on her right upper posterior arm and back that she would like looked at.  She has been able to walk on her right ankle and feels that while it was initially painful the pain is improving on its own.  Hepatocellular Carcinoma Diagnosed by pathology of liver lesions in 05/2016. Patient is aware of the diagnosis and feels she does not want to work this up further, she is not interested in referral to oncology. She cites her faith as her reason for declining further management of her cancer. She is very clear and demonstrates good judgement and reasoning.  I asked if she would be interested in referral to palliative care for management of symptoms related to cancer which may worsen in the future. She states she has to pray and think about this but she will consider it. She declines referral to palliative care at this time.  ED follow up, RUQ pain due to [facility] Patient was seen in the ED on 2/12 for RUQ pain. This was thought to be secondary to her worsening hepatic mass. She was prescribed oxycodone at that time, however she reports it made her feel nauseous and she stopped taking it.  Her pain has been controlled with ibuprofen since then and she feels that it is improved.  Bipolar disorder, needs labs Patient has a history of bipolar disorder, followed by psychiatry.  She reports that she is on Depakote.  She presents with  a prescription pad that has labs she was asked to have drawn at this visit.  The labs include Depakote level, CMP, and fasting lipid profile.  She does report that she has been fasting for these labs throughout the day today.  Review of Symptoms:  See HPI for ROS.   CC, SH/smoking status, and VS noted.  Objective: BP 128/64   Pulse 88   Temp 98.1 F (36.7 C) (Oral)   Ht 5\' 3"  (1.6 m)   Wt 135 lb 12.8 oz (61.6 kg)   SpO2 98%   BMI 24.06 kg/m  GEN: NAD, alert, cooperative, and pleasant. RESPIRATORY: clear to auscultation bilaterally with no wheezes, rhonchi or rales, good effort CV: RRR, no m/r/g, no peripheral edema GI: soft, non-tender, non-distended, no palpable masses SKIN: Approximately 5 cm x 5 cm purple abrasion noted over right thoracic back.  No palpable hematoma.  No skin breakdown or drainage.  No evidence of surrounding infection.  Mildly tender to palpation.  Additional abrasion noted over right posterior upper arm, no associated hematoma is palpable. RIGHT ANKLE: +mild ttp over lateral  PSYCH: AAOx3, appropriate affect  Assessment and plan:  BIPOLAR DISORDER Ordered Depakote level, CMP, fasting lipid profile.  When results are available, plan to fax to Dr. Norma Fredrickson [facility].  Hepatocellular carcinoma determined by biopsy of liver ([place]) Based on my  assessment, patient has capacity for making medical decisions.  She very clearly chooses not to pursue further workup and management of [place]. She will consider palliative referral. Continue to discuss at future visits. Right upper quadrant pain is currently not bothering the patient.  She reports that she took Tylenol and Advil with improvement in symptoms.  She did continue OTC management of discomfort, however minimize NSAID use due to history of CKD 3.  Would avoid narcotics in the future with this patient who is a high risk of falling and does not tolerate narcotic medications  well.  Fall Mechanical.  No red flag symptoms for syncope to warrant further cardiac or neurologic workup at this time.  It does not sound as if the patient had a seizure; she denies loss of consciousness.  Patient's injuries appear to be mild and resolving on their own.  I do not think we need imaging at this point, however return precautions were provided.   Orders Placed This Encounter  Procedures  . Valproic acid level  . Comprehensive metabolic panel    Order Specific Question:   Has the patient fasted?    Answer:   No  . Lipid panel    Order Specific Question:   Has the patient fasted?    Answer:   No    Everrett Coombe, MD,MS,  PGY2 06/11/2017 10:40 AM

## 2017-06-10 NOTE — Patient Instructions (Signed)
It was a pleasure seeing you today in our clinic.  If you become interested in a referral for palliative management of pain associated with cancer, please let me know and I would be happy to place that referral.  We drew blood work at today's visit. I will call or send you a letter with these results. If you do not hear from me within the next week, please give our office a call.  Our clinic's number is [phone]. Please call with questions or concerns about what we discussed today.  Be well, Dr. Burr Medico

## 2017-06-11 ENCOUNTER — Encounter: Payer: Self-pay | Admitting: Student in an Organized Health Care Education/Training Program

## 2017-06-11 DIAGNOSIS — C220 Liver cell carcinoma: Secondary | ICD-10-CM | POA: Insufficient documentation

## 2017-06-11 LAB — COMPREHENSIVE METABOLIC PANEL
A/G RATIO: 1.4 (ref 1.2–2.2)
ALBUMIN: 4.1 g/dL (ref 3.5–4.8)
ALK PHOS: 134 IU/L — AB (ref 39–117)
ALT: 71 IU/L — ABNORMAL HIGH (ref 0–32)
AST: 61 IU/L — ABNORMAL HIGH (ref 0–40)
BUN / CREAT RATIO: 10 — AB (ref 12–28)
BUN: 11 mg/dL (ref 8–27)
Bilirubin Total: 0.4 mg/dL (ref 0.0–1.2)
CALCIUM: 10.4 mg/dL — AB (ref 8.7–10.3)
CO2: 22 mmol/L (ref 20–29)
CREATININE: 1.11 mg/dL — AB (ref 0.57–1.00)
Chloride: 100 mmol/L (ref 96–106)
GFR calc Af Amer: 55 mL/min/{1.73_m2} — ABNORMAL LOW (ref >59)
GFR, EST NON AFRICAN AMERICAN: 48 mL/min/{1.73_m2} — AB (ref >59)
GLOBULIN, TOTAL: 3 g/dL (ref 1.5–4.5)
Glucose: 96 mg/dL (ref 65–99)
POTASSIUM: 5 mmol/L (ref 3.5–5.2)
SODIUM: 139 mmol/L (ref 134–144)
Total Protein: 7.1 g/dL (ref 6.0–8.5)

## 2017-06-11 LAB — LIPID PANEL
CHOL/HDL RATIO: 3.2 ratio (ref 0.0–4.4)
Cholesterol, Total: 161 mg/dL (ref 100–199)
HDL: 50 mg/dL (ref >39)
LDL CALC: 93 mg/dL (ref 0–99)
Triglycerides: 91 mg/dL (ref 0–149)
VLDL Cholesterol Cal: 18 mg/dL (ref 5–40)

## 2017-06-11 LAB — VALPROIC ACID LEVEL: Valproic Acid Lvl: 46 ug/mL — ABNORMAL LOW (ref 50–100)

## 2017-06-11 NOTE — Assessment & Plan Note (Addendum)
Based on my assessment, patient has capacity for making medical decisions.  She very clearly chooses not to pursue further workup and management of [place]. She will consider palliative referral. Continue to discuss at future visits. Right upper quadrant pain is currently not bothering the patient.  She reports that she took Tylenol and Advil with improvement in symptoms.  She did continue OTC management of discomfort, however minimize NSAID use due to history of CKD 3.  Would avoid narcotics in the future with this patient who is a high risk of falling and does not tolerate narcotic medications well.

## 2017-06-11 NOTE — Assessment & Plan Note (Signed)
Ordered Depakote level, CMP, fasting lipid profile.  When results are available, plan to fax to Dr. Norma Fredrickson with [facility].

## 2017-06-11 NOTE — Assessment & Plan Note (Addendum)
Mechanical.  No red flag symptoms for syncope to warrant further cardiac or neurologic workup at this time.  It does not sound as if the patient had a seizure; she denies loss of consciousness.  Patient's injuries appear to be mild and resolving on their own.  I do not think we need imaging at this point, however return precautions were provided.

## 2017-06-13 NOTE — Progress Notes (Signed)
Faxed labs to psychiatrist per Dr. Burr Medico. Katharina Caper, April D, [place]

## 2017-06-24 ENCOUNTER — Other Ambulatory Visit: Payer: Self-pay | Admitting: *Deleted

## 2017-06-25 MED ORDER — PANTOPRAZOLE SODIUM 40 MG PO TBEC
40.0000 mg | DELAYED_RELEASE_TABLET | Freq: Every day | ORAL | 3 refills | Status: AC
Start: 2017-06-25 — End: ?

## 2017-07-23 ENCOUNTER — Encounter: Payer: Self-pay | Admitting: Podiatry

## 2017-07-23 ENCOUNTER — Ambulatory Visit (INDEPENDENT_AMBULATORY_CARE_PROVIDER_SITE_OTHER): Payer: Medicare Other | Admitting: Podiatry

## 2017-07-23 DIAGNOSIS — B351 Tinea unguium: Secondary | ICD-10-CM

## 2017-07-23 DIAGNOSIS — M79674 Pain in right toe(s): Secondary | ICD-10-CM

## 2017-07-23 DIAGNOSIS — M79675 Pain in left toe(s): Secondary | ICD-10-CM

## 2017-07-23 NOTE — Progress Notes (Signed)
Patient ID: [name], female   DOB: March 12, 1941, 77 y.o.   MRN: [id number] I Visit Type: Patient returns to my office for continued preventative foot care services. Complaint: Patient states" my nails have grown long and thick and become painful to walk and wear shoes  . She presents for preventative foot care services. No changes to ROS She has developed peeling on back of her feet and she says the lac-hydrin is working well.  Podiatric Exam: Vascular: dorsalis pedis and posterior tibial pulses are palpable bilateral. Capillary return is immediate. Temperature gradient is WNL. Skin turgor WNL  Sensorium: Normal Semmes Weinstein monofilament test. Normal tactile sensation bilaterally. Nail Exam: Pt has thick disfigured discolored nails with subungual debris noted bilateral entire nail hallux through fifth toenails Ulcer Exam: There is no evidence of ulcer or pre-ulcerative changes or infection. Orthopedic Exam: Muscle tone and strength are WNL. No limitations in general ROM. No crepitus or effusions noted. Foot type and digits show no abnormalities. Bony prominences are unremarkable.  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 Skin: No Porokeratosis. No infection or ulcers.    Diagnosis:  Tinea unguium, Pain in right toe, pain in left toes  Treatment & Plan Procedures and Treatment: Consent by patient was obtained for treatment procedures. The patient understood the discussion of treatment and procedures well. All questions were answered thoroughly reviewed. Debridement of mycotic and hypertrophic toenails, 1 through 5 bilateral and clearing of subungual debris. No ulceration, no infection noted.   Signed  ABN for 2019. Return Visit-Office Procedure: Patient instructed to return to the office for a follow up visit 3 months for continued evaluation and treatment.  Gardiner Barefoot DPM

## 2017-07-30 ENCOUNTER — Other Ambulatory Visit: Payer: Self-pay | Admitting: Podiatry

## 2017-07-30 ENCOUNTER — Ambulatory Visit: Payer: Self-pay | Admitting: Family Medicine

## 2017-07-31 ENCOUNTER — Encounter: Payer: Self-pay | Admitting: Family Medicine

## 2017-07-31 ENCOUNTER — Ambulatory Visit (INDEPENDENT_AMBULATORY_CARE_PROVIDER_SITE_OTHER): Payer: Medicare Other | Admitting: Family Medicine

## 2017-07-31 ENCOUNTER — Other Ambulatory Visit: Payer: Self-pay

## 2017-07-31 VITALS — BP 124/70 | HR 87 | Temp 98.5°F | Wt 136.0 lb

## 2017-07-31 DIAGNOSIS — R350 Frequency of micturition: Secondary | ICD-10-CM

## 2017-07-31 DIAGNOSIS — J309 Allergic rhinitis, unspecified: Secondary | ICD-10-CM | POA: Diagnosis not present

## 2017-07-31 DIAGNOSIS — R198 Other specified symptoms and signs involving the digestive system and abdomen: Secondary | ICD-10-CM

## 2017-07-31 LAB — POCT URINALYSIS DIP (MANUAL ENTRY)
Bilirubin, UA: NEGATIVE
Glucose, UA: NEGATIVE mg/dL
Ketones, POC UA: NEGATIVE mg/dL
NITRITE UA: NEGATIVE
PH UA: 6.5 (ref 5.0–8.0)
Protein Ur, POC: NEGATIVE mg/dL
RBC UA: NEGATIVE
Spec Grav, UA: 1.01 (ref 1.010–1.025)
UROBILINOGEN UA: 0.2 U/dL

## 2017-07-31 LAB — POCT UA - MICROSCOPIC ONLY

## 2017-07-31 MED ORDER — SALINE SPRAY 0.65 % NA SOLN: 1.0000 | Freq: Two times a day (BID) | NASAL | 0 refills | Status: AC

## 2017-07-31 MED ORDER — POLYETHYLENE GLYCOL 3350 17 GM/SCOOP PO POWD: 17.0000 g | Freq: Every day | ORAL | 0 refills | Status: AC

## 2017-07-31 NOTE — Patient Instructions (Signed)
Thank you for coming in today, it was so nice to see you! Today we talked about:    Sneezing and cough: This is likely due to allergies. Use the nasal spray twice daily in each nostril  Increased urinary frequency: we checked your urine today for any infection. I will call you with any positive result   If you have any questions or concerns, please do not hesitate to call the office at [phone]. You can also message me directly via MyChart.   Sincerely,  Smitty Cords, MD

## 2017-07-31 NOTE — Progress Notes (Signed)
Subjective:    Patient ID: [name] , female   DOB: 03-Nov-1940 , 77 y.o..   MRN: [id number]  HPI  [name] is a 77 yo F with PMH of CHF, HTN, GERD, untreated Hepatocellular carcinoma, CKD 3, HLD, Anemia, seizures, Gout, shizoaffective disorder, bipolarhere for a same day visit for   1. Cough, sneezing: Notes that she has been coughing and sneezing for about 4 days. Denies any dyspnea or wheezing. Admits to a subjective fever yesterday. Notes that she has been eating and drinking normally. She notes that there have been a lot sick people around her recently, at her senior citizen home.    2. Diarrhea but then constipation: Had diarrhea for 2 days on Monday and Tuesday. Her appetite has been down. She does eat 3 meals and snack in between. Admits to intermettinent fever, had a subjective 1 yesterday. No nausea or vomiting. Admits to abdominal pain and back pain when she coughs. Abdominal pain is in the center. She does admit to intermittent constipation. Last time she had a BM was this morning. She notes that she is very dependent on her Miralax in order to have a daily BM.   2. Increased Urinary Frequency: Controlled and at goal todayControlled and at goal no hypertension, patient's blood patient is that she has been urinating more than usual.  She denies any dysuria.  Denies any flank pain or fevers. Admits to some urinary incontinence which is normal for her. Worsen adult diaper.  Review of Systems: Per HPI. All other systems reviewed and are negative.   Past Medical History: Patient Active Problem List   Diagnosis Date Noted  . Hepatocellular carcinoma determined by biopsy of liver ([place]) 06/11/2017  . Chronic diastolic CHF (congestive heart failure) ([place]) 10/10/2014  . Palpitations 10/10/2014  . GERD (gastroesophageal reflux disease) 12/30/2013  . Normal coronary arteries 11/09/13 11/10/2013  . Fall 12/12/2011  . SCHIZOAFFECTIVE DISORDER 11/28/2009  . ANEMIA, NORMOCYTIC,  CHRONIC 11/07/2008  . GOUT 05/25/2008  . CHRONIC KIDNEY DISEASE STAGE III (MODERATE) 08/27/2007  . HYPERCHOLESTEROLEMIA 06/12/2006  . BIPOLAR DISORDER 06/12/2006  . Essential hypertension 06/12/2006  . Convulsions/seizures ([place]) 06/12/2006    Medications: reviewed   Social Hx:  reports that she has never smoked. She has never used smokeless tobacco.   Objective:   BP 124/70   Pulse 87   Temp 98.5 F (36.9 C) (Oral)   Wt 136 lb (61.7 kg)   SpO2 99%   BMI 24.09 kg/m  Physical Exam  Gen: NAD, alert, cooperative with exam, well-appearing HEENT:     Head: Normocephalic, atraumatic    Neck: No masses palpated. No goiter. No lymphadenopathy     Ears: External ears normal, no drainage.Tympanic membranes unable to visualize due to cerumen     Eyes: PERRLA, EOMI, sclera white, normal conjunctiva    Nose: nasal turbinates moist but erythematous. R nasal turbinate with 51mm hemostatic lesion    Throat: moist mucus membranes, no pharyngeal erythema, no tonsillar exudate. Airway is patent Cardiac: Regular rate and rhythm, normal W0/J8, systolic murmur heard best in right 2nd intercostal space, no edema, capillary refill brisk  Respiratory: Clear to auscultation bilaterally, no wheezes, non-labored breathing Gastrointestinal: soft, non tender, non distended, bowel sounds present. Adult diaper present Skin: no rashes, normal turgor  Neurological: no gross deficits.  Psych: Flat affect  Results for orders placed or performed in visit on 07/31/17  POCT urinalysis dipstick  Result Value Ref Range   Color, UA  yellow yellow   Clarity, UA clear clear   Glucose, UA negative negative mg/dL   Bilirubin, UA negative negative   Ketones, POC UA negative negative mg/dL   Spec Grav, UA 1.010 1.010 - 1.025   Blood, UA negative negative   pH, UA 6.5 5.0 - 8.0   Protein Ur, POC negative negative mg/dL   Urobilinogen, UA 0.2 0.2 or 1.0 E.U./dL   Nitrite, UA Negative Negative   Leukocytes, UA Trace  (A) Negative  POCT UA - Microscopic Only  Result Value Ref Range   WBC, Ur, HPF, POC 0-3    RBC, urine, microscopic NONE    Bacteria, U Microscopic 2+    Epithelial cells, urine per micros 0-3     Assessment & Plan:   1. Increased urinary frequency: Unclear etiology at this time.  UA was checked today to rule out UTI.  UA did show trace leukocytes with 2+ bacteria.  Patient noted that she did not want to stay in the clinic to wait for her UA results.   - POCT urinalysis dipstick - POCT UA - Microscopic Only - Call patient after she left to discuss urine results and she did not answer.  Could treat for  possible UTI if she does call back  -She is also wearing adult diaper, notes that she has urinary incontinence.  This is not on her problems.  Could also refer to urology in the future.   2. Allergic rhinitis, unspecified seasonality, unspecified trigger:  -Nasal saline spray, will avoid Flonase for now as there is a tiny 2 mm hemostatic lesion in her right nasal turbinate -Return precautions discussed  3. Change in bowel function: Patient endorses intermittent diarrhea but that resolved and now she just has constipation. Non acute abdomen and vitals normal. She is supposed to be taking MiraLAX daily, she does need a refill on this.  Patient may have had a viral gastroenteritis or taking too much MiraLAX.  Of note she has hepatocellular carcinoma based off of the liver biopsy.  She is choosing not to pursue treatment of this. This may or may not be contributing to her bowel issues. --Refilled MiraLAX -Follow-up with PCP in the next week if there is no improvement  Orders Placed This Encounter  Procedures  . Urinalysis Dipstick   Meds ordered this encounter  Medications  . polyethylene glycol powder (GLYCOLAX/MIRALAX) powder    Sig: Take 17 g by mouth daily.    Dispense:  500 g    Refill:  0  . sodium chloride (OCEAN) 0.65 % SOLN nasal spray    Sig: Place 1 spray into both nostrils  2 (two) times daily.    Dispense:  1 Bottle    Refill:  0    Smitty Cords, MD [place], PGY-3

## 2017-08-27 DIAGNOSIS — H5711 Ocular pain, right eye: Secondary | ICD-10-CM | POA: Diagnosis not present

## 2017-09-11 ENCOUNTER — Other Ambulatory Visit: Payer: Self-pay | Admitting: Cardiology

## 2017-09-22 DIAGNOSIS — F319 Bipolar disorder, unspecified: Secondary | ICD-10-CM | POA: Diagnosis not present

## 2017-10-06 ENCOUNTER — Other Ambulatory Visit: Payer: Self-pay

## 2017-10-06 MED ORDER — DILTIAZEM HCL 60 MG PO TABS: 60.0000 mg | ORAL_TABLET | Freq: Three times a day (TID) | ORAL | 2 refills | Status: AC

## 2017-10-22 ENCOUNTER — Ambulatory Visit (INDEPENDENT_AMBULATORY_CARE_PROVIDER_SITE_OTHER): Payer: Medicare Other | Admitting: Podiatry

## 2017-10-22 ENCOUNTER — Encounter: Payer: Self-pay | Admitting: Podiatry

## 2017-10-22 DIAGNOSIS — B351 Tinea unguium: Secondary | ICD-10-CM | POA: Diagnosis not present

## 2017-10-22 DIAGNOSIS — M79675 Pain in left toe(s): Secondary | ICD-10-CM

## 2017-10-22 MED ORDER — AMMONIUM LACTATE 12 % EX CREA: TOPICAL_CREAM | CUTANEOUS | 0 refills | Status: AC

## 2017-10-22 NOTE — Progress Notes (Addendum)
Patient ID: [name], female   DOB: 08-Nov-1940, 77 y.o.   MRN: [id number] I Visit Type: Patient returns to my office for continued preventative foot care services. Complaint: Patient states" my nails have grown long and thick and become painful to walk and wear shoes  . She presents for preventative foot care services. No changes to ROS She has developed peeling on back of her feet and she says the lac-hydrin is working well.  Podiatric Exam: Vascular: dorsalis pedis and posterior tibial pulses are palpable bilateral. Capillary return is immediate. Temperature gradient is WNL. Skin turgor WNL  Sensorium: Normal Semmes Weinstein monofilament test. Normal tactile sensation bilaterally. Nail Exam: Pt has thick disfigured discolored nails with subungual debris noted bilateral entire nail hallux through fifth toenails Ulcer Exam: There is no evidence of ulcer or pre-ulcerative changes or infection. Orthopedic Exam: Muscle tone and strength are WNL. No limitations in general ROM. No crepitus or effusions noted. Foot type and digits show no abnormalities. Bony prominences are unremarkable.  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  Swelling  B/L. Skin: No Porokeratosis. No infection or ulcers.    Diagnosis:  Tinea unguium, Pain in right toe, pain in left toes  Treatment & Plan Procedures and Treatment: Consent by patient was obtained for treatment procedures. The patient understood the discussion of treatment and procedures well. All questions were answered thoroughly reviewed. Debridement of mycotic and hypertrophic toenails, 1 through 5 bilateral and clearing of subungual debris. No ulceration, no infection noted.   Signed  ABN for 2019.  Prescribe lac-hydrin. Return Visit-Office Procedure: Patient instructed to return to the office for a follow up visit 3 months for continued evaluation and treatment.  Gardiner Barefoot DPM

## 2017-10-22 NOTE — Addendum Note (Signed)
Addended byDeidre Ala, Angelo Caroll L on: 10/22/2017 10:00 AM   Modules accepted: Orders

## 2017-10-23 DIAGNOSIS — Z79899 Other long term (current) drug therapy: Secondary | ICD-10-CM | POA: Diagnosis not present

## 2017-11-10 DIAGNOSIS — F319 Bipolar disorder, unspecified: Secondary | ICD-10-CM | POA: Diagnosis not present

## 2017-11-12 ENCOUNTER — Telehealth: Payer: Self-pay | Admitting: Family Medicine

## 2017-11-12 NOTE — Telephone Encounter (Signed)
Received phone call as I am precepting this afternoon.  Patient's psychiatrist, Dr. Casimiro Needle, called asking to speak to PCP Dr. Burr Medico. He follows patient for bipolar disorder, which is well controlled on depakote. He did routine labwork and found her AST and ALT to be 3x higher than normal. He did not have specific lab results in front of him but states that his office has faxed results to our office (they may be in Dr. Ernestina Penna inbox).  He is planning to repeat the LFTs in 2 weeks but requests patient have PCP appointment to evaluate potential medical causes of transaminitis.  I scheduled patient for a visit with Dr. Burr Medico on 11/19/17 at 10:50am. [facility] team, please call patient to inform her of this appointment. She is expecting to hear from our clinic.  FYI to PCP.  Leeanne Rio, MD

## 2017-11-13 NOTE — Telephone Encounter (Signed)
Contacted pt and LVM on home and mobile to have pt call office back to inform her of her upcoming appointment with Dr. Burr Medico on 11/19/17 @10 :50am.  Please inform her of this if she calls back. Katharina Caper, April D, [place]

## 2017-11-13 NOTE — Telephone Encounter (Signed)
Left detailed message on home number stating the appointment date and time.  If pt calls back for any reason please give them the time and date of appointment. Katharina Caper, Kraig Genis D, [place]

## 2017-11-19 ENCOUNTER — Ambulatory Visit (INDEPENDENT_AMBULATORY_CARE_PROVIDER_SITE_OTHER): Payer: Medicare Other | Admitting: Student in an Organized Health Care Education/Training Program

## 2017-11-19 ENCOUNTER — Encounter: Payer: Self-pay | Admitting: Student in an Organized Health Care Education/Training Program

## 2017-11-19 VITALS — BP 115/70 | HR 92 | Temp 97.9°F | Wt 129.6 lb

## 2017-11-19 DIAGNOSIS — R945 Abnormal results of liver function studies: Secondary | ICD-10-CM | POA: Diagnosis not present

## 2017-11-19 DIAGNOSIS — R7989 Other specified abnormal findings of blood chemistry: Secondary | ICD-10-CM

## 2017-11-19 DIAGNOSIS — F319 Bipolar disorder, unspecified: Secondary | ICD-10-CM | POA: Diagnosis not present

## 2017-11-19 NOTE — Patient Instructions (Signed)
It was a pleasure seeing you today in our clinic.   Our clinic's number is [phone]. Please call with questions or concerns about what we discussed today.  Be well, Dr. Brysyn Brandenberger   

## 2017-11-19 NOTE — Progress Notes (Signed)
CC: elevated LFT's  HPI: [name] is a 77 y.o. female with PMH significant for [facility] who presents to [facility] today for follow up lab work.  Patient is a 77 year old female with a history of known hepatocellular carcinoma which she has chosen not to treat. I spoke with the patient in 05/2017 for an office visit in which she indicated she has no interest in pursuing an oncology consult, she never wants surgery for this, and she believes it is in God's hands.   Patient follows with psychiatry for bipolar disorder and schizoaffective disorder for which she takes depakote and risperidone. Routine labs were ordered by psychiatry and showed elevated liver enzymes. We do not have the lab results but the psych office called our office to see if we would pursue further workup of elevated LFT's.  Today, patient reports that she remains uninterested in pursuing further workup of her cancer. She understands that she has cancer but she does not want referral to oncology or even palliative care at this time. She is not even interested in potentially less invasive measures that oncology may be able to pursue to treat her cancer. She is not interested in blood work to pursue further workup of LFT's (including hepatitis panel). Her main concern is quality of life.   She is very concerned with being able to get refills on her depakote. She reports that she has had a very difficult time with mood in the past when she was off of this medication. She reports that her mood is well controlled on her current dose. She vocalizes understanding that depakote can affect her liver but it is most important to her that she is prescribed this medication.  Review of Symptoms:  See HPI for ROS.   CC, SH/smoking status, and VS noted.  Objective: BP 115/70 (BP Location: Left Arm, Patient Position: Sitting, Cuff Size: Normal)   Pulse 92   Temp 97.9 F (36.6 C) (Oral)   Wt 129 lb 9.6 oz (58.8 kg)   SpO2 98%   BMI 22.96 kg/m    GEN: NAD, alert, cooperative, and pleasant. RESPIRATORY: comfortable work of breathing CV: RRR GI: soft, non-tender, non-distended PSYCH: AAOx3, appropriate affect   Assessment and plan:  Abnormal LFTs Tested at psychiatry office for monitoring on depakote. Patient has known [place] and is not interested in pursuing workup for elevated LFTs. I have had multiple long discussions with her in which she has been very clear that she is not interested in workup or treatment for [facility]. She additionally reports that the most important thing to her is receiving depakote refills as this greatly impacts her quality of life. Her goals of care are quality over quantity of life.  - refilled depakote and risperdol  - patient to follow up more frequently, schedule f/u in one month - she is not interested in palliative care consult yet - again offered oncology referral and she vehemently refuses  BIPOLAR DISORDER Refilled depakote and risperdol. We will not check LFTs given that this will not change management and patient is comfort care. Follow up in one month.   No orders of the defined types were placed in this encounter.    Meds ordered this encounter  Medications  . divalproex (DEPAKOTE ER) 250 MG 24 hr tablet    Sig: Take 1 tablet (250 mg total) by mouth daily.    Dispense:  30 tablet    Refill:  0  . risperiDONE (RISPERDAL) 2 MG tablet  Sig: Take 1 tablet (2 mg total) by mouth at bedtime.    Dispense:  30 tablet    Refill:  0    Everrett Coombe, MD,MS,  PGY3 11/24/2017 10:09 AM

## 2017-11-24 ENCOUNTER — Encounter: Payer: Self-pay | Admitting: Student in an Organized Health Care Education/Training Program

## 2017-11-24 DIAGNOSIS — R945 Abnormal results of liver function studies: Secondary | ICD-10-CM | POA: Insufficient documentation

## 2017-11-24 DIAGNOSIS — R7989 Other specified abnormal findings of blood chemistry: Secondary | ICD-10-CM | POA: Insufficient documentation

## 2017-11-24 MED ORDER — RISPERIDONE 2 MG PO TABS: 2.0000 mg | ORAL_TABLET | Freq: Every day | ORAL | 0 refills | Status: DC

## 2017-11-24 MED ORDER — DIVALPROEX SODIUM ER 250 MG PO TB24: 250.0000 mg | ORAL_TABLET | Freq: Every day | ORAL | 0 refills | Status: DC

## 2017-11-24 NOTE — Assessment & Plan Note (Signed)
Refilled depakote and risperdol. We will not check LFTs given that this will not change management and patient is comfort care. Follow up in one month.

## 2017-11-24 NOTE — Assessment & Plan Note (Signed)
Tested at psychiatry office for monitoring on depakote. Patient has known [place] and is not interested in pursuing workup for elevated LFTs. I have had multiple long discussions with her in which she has been very clear that she is not interested in workup or treatment for [facility]. She additionally reports that the most important thing to her is receiving depakote refills as this greatly impacts her quality of life. Her goals of care are quality over quantity of life.  - refilled depakote and risperdol  - patient to follow up more frequently, schedule f/u in one month - she is not interested in palliative care consult yet - again offered oncology referral and she vehemently refuses

## 2017-11-26 ENCOUNTER — Other Ambulatory Visit: Payer: Self-pay | Admitting: Student in an Organized Health Care Education/Training Program

## 2017-11-26 DIAGNOSIS — F319 Bipolar disorder, unspecified: Secondary | ICD-10-CM

## 2017-11-28 DIAGNOSIS — T1490XA Injury, unspecified, initial encounter: Secondary | ICD-10-CM | POA: Diagnosis not present

## 2017-11-29 ENCOUNTER — Emergency Department (HOSPITAL_COMMUNITY)
Admission: EM | Admit: 2017-11-29 | Discharge: 2017-11-29 | Disposition: A | Discharge status: Home / Self Care | Payer: Medicare Other | Source: Home / Self Care | Attending: Emergency Medicine | Admitting: Emergency Medicine

## 2017-11-29 ENCOUNTER — Emergency Department (HOSPITAL_COMMUNITY): Payer: Medicare Other

## 2017-11-29 ENCOUNTER — Encounter (HOSPITAL_COMMUNITY): Payer: Self-pay | Admitting: Emergency Medicine

## 2017-11-29 ENCOUNTER — Other Ambulatory Visit: Payer: Self-pay

## 2017-11-29 DIAGNOSIS — I5032 Chronic diastolic (congestive) heart failure: Secondary | ICD-10-CM

## 2017-11-29 DIAGNOSIS — N183 Chronic kidney disease, stage 3 (moderate): Secondary | ICD-10-CM

## 2017-11-29 DIAGNOSIS — E870 Hyperosmolality and hypernatremia: Secondary | ICD-10-CM | POA: Diagnosis not present

## 2017-11-29 DIAGNOSIS — R55 Syncope and collapse: Secondary | ICD-10-CM | POA: Diagnosis not present

## 2017-11-29 DIAGNOSIS — R102 Pelvic and perineal pain: Secondary | ICD-10-CM | POA: Diagnosis not present

## 2017-11-29 DIAGNOSIS — R531 Weakness: Secondary | ICD-10-CM | POA: Insufficient documentation

## 2017-11-29 DIAGNOSIS — Z7982 Long term (current) use of aspirin: Secondary | ICD-10-CM | POA: Insufficient documentation

## 2017-11-29 DIAGNOSIS — Z79899 Other long term (current) drug therapy: Secondary | ICD-10-CM | POA: Insufficient documentation

## 2017-11-29 DIAGNOSIS — C220 Liver cell carcinoma: Secondary | ICD-10-CM | POA: Diagnosis not present

## 2017-11-29 DIAGNOSIS — R4182 Altered mental status, unspecified: Secondary | ICD-10-CM | POA: Diagnosis not present

## 2017-11-29 DIAGNOSIS — M7989 Other specified soft tissue disorders: Secondary | ICD-10-CM | POA: Diagnosis not present

## 2017-11-29 DIAGNOSIS — S299XXA Unspecified injury of thorax, initial encounter: Secondary | ICD-10-CM | POA: Diagnosis not present

## 2017-11-29 DIAGNOSIS — I130 Hypertensive heart and chronic kidney disease with heart failure and stage 1 through stage 4 chronic kidney disease, or unspecified chronic kidney disease: Secondary | ICD-10-CM | POA: Insufficient documentation

## 2017-11-29 DIAGNOSIS — G40211 Localization-related (focal) (partial) symptomatic epilepsy and epileptic syndromes with complex partial seizures, intractable, with status epilepticus: Secondary | ICD-10-CM | POA: Diagnosis not present

## 2017-11-29 DIAGNOSIS — M79661 Pain in right lower leg: Secondary | ICD-10-CM | POA: Diagnosis not present

## 2017-11-29 DIAGNOSIS — M79662 Pain in left lower leg: Secondary | ICD-10-CM | POA: Diagnosis not present

## 2017-11-29 DIAGNOSIS — G936 Cerebral edema: Secondary | ICD-10-CM | POA: Diagnosis not present

## 2017-11-29 DIAGNOSIS — E871 Hypo-osmolality and hyponatremia: Secondary | ICD-10-CM | POA: Diagnosis not present

## 2017-11-29 DIAGNOSIS — R51 Headache: Secondary | ICD-10-CM | POA: Diagnosis not present

## 2017-11-29 LAB — BASIC METABOLIC PANEL
Anion gap: 11 (ref 5–15)
BUN: 15 mg/dL (ref 8–23)
CALCIUM: 10.4 mg/dL — AB (ref 8.9–10.3)
CO2: 26 mmol/L (ref 22–32)
Chloride: 100 mmol/L (ref 98–111)
Creatinine, Ser: 1.05 mg/dL — ABNORMAL HIGH (ref 0.44–1.00)
GFR calc Af Amer: 58 mL/min — ABNORMAL LOW (ref >60)
GFR, EST NON AFRICAN AMERICAN: 50 mL/min — AB (ref >60)
GLUCOSE: 119 mg/dL — AB (ref 70–99)
POTASSIUM: 4 mmol/L (ref 3.5–5.1)
SODIUM: 137 mmol/L (ref 135–145)

## 2017-11-29 LAB — CBC
HCT: 32.4 % — ABNORMAL LOW (ref 36.0–46.0)
Hemoglobin: 10.7 g/dL — ABNORMAL LOW (ref 12.0–15.0)
MCH: 25.7 pg — AB (ref 26.0–34.0)
MCHC: 33 g/dL (ref 30.0–36.0)
MCV: 77.7 fL — AB (ref 78.0–100.0)
PLATELETS: 268 10*3/uL (ref 150–400)
RBC: 4.17 MIL/uL (ref 3.87–5.11)
RDW: 16.9 % — ABNORMAL HIGH (ref 11.5–15.5)
WBC: 7.6 10*3/uL (ref 4.0–10.5)

## 2017-11-29 NOTE — ED Provider Notes (Signed)
[facility] EMERGENCY DEPARTMENT Provider Note   CSN: [id number] Arrival date & time: 11/29/17  4782     History   Chief Complaint Chief Complaint  Patient presents with  . Request NH placement    HPI [name] is a 77 y.o. female.  HPI 77 year old female was in her normal state of health today when she got up to use her bedside commode and then felt to generally weak to get off of this.  She contacted her family member came to the house.  Family reports that they found her kneeled on the ground in front of the bedside commode.  She had no slurred speech or unilateral arm or leg weakness.  She does feel generally weak.  She denies vomiting.  No diarrhea.  No fevers or chills.  No productive cough.  No urinary symptoms.  Symptoms are mild in severity.   Past Medical History:  Diagnosis Date  . Anemia   . Bipolar 1 disorder ([place])   . Chronic diastolic CHF (congestive heart failure) ([place]) 10/10/2014   LVEDP elevated at time of cath  . CKD (chronic kidney disease), stage III ([place])   . Depression   . Gout   . Hypertension   . Normal coronary arteries 2015  . Schizoaffective disorder   . Seizure disorder ([place])   . Thrombocytopenia ([place])   . Vasovagal syncope    a. possible vasovagal syncope in setting of atypical CP 02/2016.    Patient Active Problem List   Diagnosis Date Noted  . Abnormal LFTs 11/24/2017  . Hepatocellular carcinoma determined by biopsy of liver ([place]) 06/11/2017  . Chronic diastolic CHF (congestive heart failure) ([place]) 10/10/2014  . Palpitations 10/10/2014  . GERD (gastroesophageal reflux disease) 12/30/2013  . Normal coronary arteries 11/09/13 11/10/2013  . Fall 12/12/2011  . SCHIZOAFFECTIVE DISORDER 11/28/2009  . ANEMIA, NORMOCYTIC, CHRONIC 11/07/2008  . GOUT 05/25/2008  . CHRONIC KIDNEY DISEASE STAGE III ([facility]ER[place]) 08/27/2007  . HYPERCHOLESTEROLEMIA 06/12/2006  . BIPOLAR DISORDER 06/12/2006  . Essential hypertension 06/12/2006    . Convulsions/seizures ([place]) 06/12/2006    Past Surgical History:  Procedure Laterality Date  . APPENDECTOMY    . CARDIAC CATHETERIZATION  2005   Normal  . CARDIAC CATHETERIZATION  11/09/2013   Normal  . LEFT HEART CATHETERIZATION WITH CORONARY ANGIOGRAM N/A 11/09/2013   Procedure: LEFT HEART CATHETERIZATION WITH CORONARY ANGIOGRAM;  Surgeon: Leonie Man, MD;  Location[facility] C[facility] LAB;  Service: Cardiovascular;  Laterality: N/A;  . TUBAL LIGATION       OB History   None      Home Medications    Prior to Admission medications   Medication Sig Start Date End Date Taking? Authorizing Provider  Alum & Mag Hydroxide-Simeth (COMFORT GEL PO) Take 15 mLs by mouth daily as needed (for stomach).     [provider]  ammonium lactate (AMLACTIN) 12 % cream APPLY 1 GRAM TO THE AFFECTED AREA AS NEEDED FOR DRY SKIN 10/22/17   Gardiner Barefoot, DPM  aspirin EC 81 MG tablet Take 1 tablet (81 mg total) by mouth daily. 04/23/17   Everrett Coombe, MD  Calcium Citrate-Vitamin D (CALCIUM CITRATE + D3 PO) Take 1 tablet by mouth 3 (three) times daily.     [provider]  diclofenac sodium (VOLTAREN) 1 % GEL Apply 2 g topically 2 (two) times daily as needed (pain).    [provider]  diltiazem (CARDIZEM) 60 MG tablet Take 1 tablet (60 mg total) by mouth  3 (three) times daily. 10/06/17   Sueanne Margarita, MD  divalproex (DEPAKOTE ER) 250 MG 24 hr tablet TAKE 1 TABLET(250 MG) BY MOUTH DAILY 11/26/17   Everrett Coombe, MD  Multiple Vitamins-Minerals (CENTRUM SILVER ADULT 50+ PO) Take 1 tablet by mouth daily.    [provider]  nitroGLYCERIN (NITROSTAT) 0.4 MG SL tablet PLACE 1 TABLET UNDER THE THE TONGUE AS DIRECTED EVERY 5 MINUTES AS NEEDED FOR CHEST PAIN 04/05/16   Rumley, [place] N, DO  nitroGLYCERIN (NITROSTAT) 0.4 MG SL tablet PLACE 1 TABLET UNDER THE THE TONGUE AS DIRECTED EVERY 5 MINUTES AS NEEDED FOR CHEST PAIN 09/11/17   Sueanne Margarita, MD  oxyCODONE (ROXICODONE) 5 MG  immediate release tablet Take 0.5 tablets (2.5 mg total) by mouth every 6 (six) hours as needed for severe pain. 05/27/17   Joy, Shawn C, PA-C  pantoprazole (PROTONIX) 40 MG tablet Take 1 tablet (40 mg total) by mouth daily. 06/25/17   Everrett Coombe, MD  polyethylene glycol powder (GLYCOLAX/MIRALAX) powder Take 17 g by mouth daily. 07/31/17   Carlyle Dolly, MD  Polyvinyl Alcohol-Povidone (REFRESH OP) Place 1-2 drops into both eyes at bedtime as needed (dry eyes).     [provider]  risperiDONE (RISPERDAL) 2 MG tablet TAKE 1 TABLET(2 MG) BY MOUTH AT BEDTIME 11/26/17   Everrett Coombe, MD  sodium chloride (OCEAN) 0.65 % SOLN nasal spray Place 1 spray into both nostrils 2 (two) times daily. 07/31/17   Carlyle Dolly, MD  vitamin C (ASCORBIC ACID) 500 MG tablet Take 500 mg by mouth daily.    [provider]    Family History Family History  Problem Relation Age of Onset  . Heart attack Mother   . Heart disease Mother   . Heart attack Father   . Heart disease Father   . Hypertension Brother     Social History Social History   Tobacco Use  . Smoking status: Never Smoker  . Smokeless tobacco: Never Used  Substance Use Topics  . Alcohol use: No  . Drug use: No     Allergies   Ativan [lorazepam]; Benztropine mesylate; Clonazepam; Vicodin [hydrocodone-acetaminophen]; Lisinopril; and Sulfonamide derivatives   Review of Systems Review of Systems  All other systems reviewed and are negative.    Physical Exam Updated Vital Signs BP (!) 150/74   Pulse 79   Temp 98 F (36.7 C) (Oral)   Resp 17   SpO2 99%   Physical Exam  Constitutional: She is oriented to person, place, and time. She appears well-developed and well-nourished. No distress.  HENT:  Head: Normocephalic and atraumatic.  Eyes: Pupils are equal, round, and reactive to light. EOM are normal.  Neck: Normal range of motion.  Cardiovascular: Normal rate, regular rhythm and normal heart sounds.    Pulmonary/Chest: Effort normal and breath sounds normal.  Abdominal: Soft. She exhibits no distension. There is no tenderness.  Musculoskeletal: Normal range of motion.  Neurological: She is alert and oriented to person, place, and time.  5/5 strength in major muscle groups of  bilateral upper and lower extremities. Speech normal. No facial asymetry.   Skin: Skin is warm and dry.  Psychiatric: She has a normal mood and affect. Judgment normal.  Nursing note and vitals reviewed.    ED Treatments / Results  Labs (all labs ordered are listed, but only abnormal results are displayed) Labs Reviewed  CBC - Abnormal; Notable for the following components:      Result Value  Hemoglobin 10.7 (*)    HCT 32.4 (*)    MCV 77.7 (*)    MCH 25.7 (*)    RDW 16.9 (*)    All other components within normal limits  BASIC METABOLIC PANEL - Abnormal; Notable for the following components:   Glucose, Bld 119 (*)    Creatinine, Ser 1.05 (*)    Calcium 10.4 (*)    GFR calc non Af Amer 50 (*)    GFR calc Af Amer 58 (*)    All other components within normal limits  URINALYSIS, ROUTINE W REFLEX MICROSCOPIC    EKG None  Radiology Ct Head Wo Contrast  Result Date: 11/29/2017 CLINICAL DATA:  Generalize weakness. Acute headache. EXAM: CT HEAD WITHOUT CONTRAST TECHNIQUE: Contiguous axial images were obtained from the base of the skull through the vertex without intravenous contrast. COMPARISON:  MRI brain 01/14/2013. CT head 05/13/2012. FINDINGS: Brain: Mild diffuse cerebral atrophy. Ventricular dilatation likely due to central atrophy. Patchy low-attenuation changes in the deep white matter consistent small vessel ischemia. No mass-effect or midline shift. No abnormal extra-axial fluid collections. Gray-white matter junctions are distinct. Basal cisterns are not effaced. No acute intracranial hemorrhage. Vascular: Moderate intracranial arterial vascular calcifications are present. Skull: Calvarium appears  intact. Sinuses/Orbits: Paranasal sinuses and mastoid air cells are clear. Other: None. IMPRESSION: No acute intracranial abnormalities. Chronic atrophy and small vessel ischemic changes. Electronically Signed   By: Lucienne Capers M.D.   On: 11/29/2017 04:50    Procedures Procedures (including critical care time)  Medications Ordered in ED Medications - No data to display   Initial Impression / Assessment and Plan / ED Course  I have reviewed the triage vital signs and the nursing notes.  Pertinent labs & imaging results that were available during my care of the patient were reviewed by me and considered in my medical decision making (see chart for details).     No focal weakness on examination.  CT imaging of her head is without abnormality.  Doubt stroke given the bilateral nature of her symptoms earlier.  Labs and urine without significant abnormality.  Close primary care follow-up.  I do not think she needs additional testing or admission the hospital at this time  Patient and family encouraged to return the emergency department for new or worsening symptoms  Final Clinical Impressions(s) / ED Diagnoses   Final diagnoses:  Generalized weakness    ED Discharge Orders    None       Jola Schmidt, MD 11/29/17 [phone]

## 2017-11-29 NOTE — ED Triage Notes (Addendum)
Pt to triage via [facility].  Pt requesting NH placement.  States she has generalized weakness and unable to ambulate to the bathroom by herself.  Reports she has home health during the day but doesn't feel that she can go to the bathroom by herself at night.  Pt asked EMS if she could "spend the night" at the hospital tonight.

## 2017-11-29 NOTE — ED Notes (Signed)
Assisted patient to restroom. Pt able to ambulate with assistance. Pt shows to need assistance at home. States she uses a cane and walker at home. Brother at bedside.

## 2017-12-01 ENCOUNTER — Inpatient Hospital Stay (HOSPITAL_COMMUNITY)
Admission: EM | Admit: 2017-12-01 | Discharge: 2018-01-13 | DRG: 100 | Disposition: E | Payer: Medicare Other | Attending: Family Medicine | Admitting: Family Medicine

## 2017-12-01 ENCOUNTER — Encounter (HOSPITAL_COMMUNITY): Payer: Self-pay

## 2017-12-01 ENCOUNTER — Emergency Department (HOSPITAL_COMMUNITY): Payer: Medicare Other

## 2017-12-01 ENCOUNTER — Inpatient Hospital Stay (HOSPITAL_COMMUNITY): Payer: Medicare Other

## 2017-12-01 DIAGNOSIS — I5032 Chronic diastolic (congestive) heart failure: Secondary | ICD-10-CM | POA: Diagnosis present

## 2017-12-01 DIAGNOSIS — W1830XA Fall on same level, unspecified, initial encounter: Secondary | ICD-10-CM | POA: Diagnosis present

## 2017-12-01 DIAGNOSIS — R0682 Tachypnea, not elsewhere classified: Secondary | ICD-10-CM | POA: Diagnosis not present

## 2017-12-01 DIAGNOSIS — Y92008 Other place in unspecified non-institutional (private) residence as the place of occurrence of the external cause: Secondary | ICD-10-CM

## 2017-12-01 DIAGNOSIS — E870 Hyperosmolality and hypernatremia: Secondary | ICD-10-CM | POA: Diagnosis not present

## 2017-12-01 DIAGNOSIS — R627 Adult failure to thrive: Secondary | ICD-10-CM | POA: Diagnosis present

## 2017-12-01 DIAGNOSIS — Z4682 Encounter for fitting and adjustment of non-vascular catheter: Secondary | ICD-10-CM | POA: Diagnosis not present

## 2017-12-01 DIAGNOSIS — M19011 Primary osteoarthritis, right shoulder: Secondary | ICD-10-CM | POA: Diagnosis not present

## 2017-12-01 DIAGNOSIS — S20212A Contusion of left front wall of thorax, initial encounter: Secondary | ICD-10-CM | POA: Diagnosis present

## 2017-12-01 DIAGNOSIS — G40211 Localization-related (focal) (partial) symptomatic epilepsy and epileptic syndromes with complex partial seizures, intractable, with status epilepticus: Principal | ICD-10-CM | POA: Diagnosis present

## 2017-12-01 DIAGNOSIS — E871 Hypo-osmolality and hyponatremia: Secondary | ICD-10-CM | POA: Diagnosis present

## 2017-12-01 DIAGNOSIS — S299XXA Unspecified injury of thorax, initial encounter: Secondary | ICD-10-CM | POA: Diagnosis not present

## 2017-12-01 DIAGNOSIS — I130 Hypertensive heart and chronic kidney disease with heart failure and stage 1 through stage 4 chronic kidney disease, or unspecified chronic kidney disease: Secondary | ICD-10-CM | POA: Diagnosis present

## 2017-12-01 DIAGNOSIS — Z79899 Other long term (current) drug therapy: Secondary | ICD-10-CM | POA: Diagnosis not present

## 2017-12-01 DIAGNOSIS — Z8249 Family history of ischemic heart disease and other diseases of the circulatory system: Secondary | ICD-10-CM | POA: Diagnosis not present

## 2017-12-01 DIAGNOSIS — S0003XA Contusion of scalp, initial encounter: Secondary | ICD-10-CM | POA: Diagnosis present

## 2017-12-01 DIAGNOSIS — G936 Cerebral edema: Secondary | ICD-10-CM | POA: Diagnosis present

## 2017-12-01 DIAGNOSIS — E46 Unspecified protein-calorie malnutrition: Secondary | ICD-10-CM | POA: Diagnosis not present

## 2017-12-01 DIAGNOSIS — M7989 Other specified soft tissue disorders: Secondary | ICD-10-CM | POA: Diagnosis not present

## 2017-12-01 DIAGNOSIS — G40909 Epilepsy, unspecified, not intractable, without status epilepticus: Secondary | ICD-10-CM | POA: Diagnosis not present

## 2017-12-01 DIAGNOSIS — S5001XA Contusion of right elbow, initial encounter: Secondary | ICD-10-CM | POA: Diagnosis not present

## 2017-12-01 DIAGNOSIS — S8011XA Contusion of right lower leg, initial encounter: Secondary | ICD-10-CM | POA: Diagnosis present

## 2017-12-01 DIAGNOSIS — F259 Schizoaffective disorder, unspecified: Secondary | ICD-10-CM | POA: Diagnosis present

## 2017-12-01 DIAGNOSIS — R4701 Aphasia: Secondary | ICD-10-CM | POA: Diagnosis present

## 2017-12-01 DIAGNOSIS — Z7189 Other specified counseling: Secondary | ICD-10-CM | POA: Diagnosis not present

## 2017-12-01 DIAGNOSIS — N183 Chronic kidney disease, stage 3 (moderate): Secondary | ICD-10-CM | POA: Diagnosis present

## 2017-12-01 DIAGNOSIS — D72829 Elevated white blood cell count, unspecified: Secondary | ICD-10-CM | POA: Diagnosis present

## 2017-12-01 DIAGNOSIS — G40901 Epilepsy, unspecified, not intractable, with status epilepticus: Secondary | ICD-10-CM | POA: Diagnosis not present

## 2017-12-01 DIAGNOSIS — R05 Cough: Secondary | ICD-10-CM | POA: Diagnosis present

## 2017-12-01 DIAGNOSIS — C220 Liver cell carcinoma: Secondary | ICD-10-CM | POA: Diagnosis present

## 2017-12-01 DIAGNOSIS — R531 Weakness: Secondary | ICD-10-CM | POA: Diagnosis not present

## 2017-12-01 DIAGNOSIS — W19XXXA Unspecified fall, initial encounter: Secondary | ICD-10-CM | POA: Diagnosis not present

## 2017-12-01 DIAGNOSIS — E785 Hyperlipidemia, unspecified: Secondary | ICD-10-CM | POA: Diagnosis present

## 2017-12-01 DIAGNOSIS — R4182 Altered mental status, unspecified: Secondary | ICD-10-CM | POA: Diagnosis not present

## 2017-12-01 DIAGNOSIS — D649 Anemia, unspecified: Secondary | ICD-10-CM | POA: Diagnosis present

## 2017-12-01 DIAGNOSIS — R102 Pelvic and perineal pain: Secondary | ICD-10-CM | POA: Diagnosis not present

## 2017-12-01 DIAGNOSIS — C228 Malignant neoplasm of liver, primary, unspecified as to type: Secondary | ICD-10-CM | POA: Diagnosis not present

## 2017-12-01 DIAGNOSIS — S20312A Abrasion of left front wall of thorax, initial encounter: Secondary | ICD-10-CM | POA: Diagnosis present

## 2017-12-01 DIAGNOSIS — K117 Disturbances of salivary secretion: Secondary | ICD-10-CM

## 2017-12-01 DIAGNOSIS — R569 Unspecified convulsions: Secondary | ICD-10-CM | POA: Diagnosis not present

## 2017-12-01 DIAGNOSIS — R52 Pain, unspecified: Secondary | ICD-10-CM | POA: Diagnosis not present

## 2017-12-01 DIAGNOSIS — S129XXA Fracture of neck, unspecified, initial encounter: Secondary | ICD-10-CM

## 2017-12-01 DIAGNOSIS — Z6823 Body mass index (BMI) 23.0-23.9, adult: Secondary | ICD-10-CM

## 2017-12-01 DIAGNOSIS — Z7982 Long term (current) use of aspirin: Secondary | ICD-10-CM | POA: Diagnosis not present

## 2017-12-01 DIAGNOSIS — Z515 Encounter for palliative care: Secondary | ICD-10-CM | POA: Diagnosis not present

## 2017-12-01 DIAGNOSIS — R54 Age-related physical debility: Secondary | ICD-10-CM | POA: Diagnosis present

## 2017-12-01 DIAGNOSIS — S8012XA Contusion of left lower leg, initial encounter: Secondary | ICD-10-CM | POA: Diagnosis present

## 2017-12-01 DIAGNOSIS — K219 Gastro-esophageal reflux disease without esophagitis: Secondary | ICD-10-CM | POA: Diagnosis present

## 2017-12-01 DIAGNOSIS — G40119 Localization-related (focal) (partial) symptomatic epilepsy and epileptic syndromes with simple partial seizures, intractable, without status epilepticus: Secondary | ICD-10-CM | POA: Diagnosis not present

## 2017-12-01 DIAGNOSIS — E860 Dehydration: Secondary | ICD-10-CM | POA: Diagnosis not present

## 2017-12-01 DIAGNOSIS — M79661 Pain in right lower leg: Secondary | ICD-10-CM | POA: Diagnosis not present

## 2017-12-01 DIAGNOSIS — M79662 Pain in left lower leg: Secondary | ICD-10-CM | POA: Diagnosis not present

## 2017-12-01 DIAGNOSIS — I10 Essential (primary) hypertension: Secondary | ICD-10-CM | POA: Diagnosis not present

## 2017-12-01 DIAGNOSIS — R000 Tachycardia, unspecified: Secondary | ICD-10-CM | POA: Diagnosis not present

## 2017-12-01 DIAGNOSIS — Z66 Do not resuscitate: Secondary | ICD-10-CM | POA: Diagnosis not present

## 2017-12-01 DIAGNOSIS — R55 Syncope and collapse: Secondary | ICD-10-CM | POA: Diagnosis not present

## 2017-12-01 LAB — COMPREHENSIVE METABOLIC PANEL
ALK PHOS: 217 U/L — AB (ref 38–126)
ALT: 138 U/L — AB (ref 0–44)
ANION GAP: 15 (ref 5–15)
AST: 115 U/L — ABNORMAL HIGH (ref 15–41)
Albumin: 3.6 g/dL (ref 3.5–5.0)
BILIRUBIN TOTAL: 1.1 mg/dL (ref 0.3–1.2)
BUN: 20 mg/dL (ref 8–23)
CALCIUM: 10.8 mg/dL — AB (ref 8.9–10.3)
CO2: 26 mmol/L (ref 22–32)
CREATININE: 1.22 mg/dL — AB (ref 0.44–1.00)
Chloride: 97 mmol/L — ABNORMAL LOW (ref 98–111)
GFR calc non Af Amer: 42 mL/min — ABNORMAL LOW (ref >60)
GFR, EST AFRICAN AMERICAN: 48 mL/min — AB (ref >60)
GLUCOSE: 120 mg/dL — AB (ref 70–99)
Potassium: 4.4 mmol/L (ref 3.5–5.1)
SODIUM: 138 mmol/L (ref 135–145)
TOTAL PROTEIN: 7.5 g/dL (ref 6.5–8.1)

## 2017-12-01 LAB — BASIC METABOLIC PANEL
Anion gap: 13 (ref 5–15)
BUN: 21 mg/dL (ref 8–23)
CALCIUM: 9.8 mg/dL (ref 8.9–10.3)
CO2: 23 mmol/L (ref 22–32)
CREATININE: 1.12 mg/dL — AB (ref 0.44–1.00)
Chloride: 102 mmol/L (ref 98–111)
GFR calc Af Amer: 53 mL/min — ABNORMAL LOW (ref >60)
GFR, EST NON AFRICAN AMERICAN: 46 mL/min — AB (ref >60)
GLUCOSE: 103 mg/dL — AB (ref 70–99)
Potassium: 4.3 mmol/L (ref 3.5–5.1)
Sodium: 138 mmol/L (ref 135–145)

## 2017-12-01 LAB — DIFFERENTIAL
Abs Immature Granulocytes: 0 10*3/uL (ref 0.0–0.1)
BASOS ABS: 0 10*3/uL (ref 0.0–0.1)
Basophils Relative: 0 %
EOS ABS: 0 10*3/uL (ref 0.0–0.7)
EOS PCT: 0 %
IMMATURE GRANULOCYTES: 0 %
Lymphocytes Relative: 15 %
Lymphs Abs: 1.5 10*3/uL (ref 0.7–4.0)
MONO ABS: 0.8 10*3/uL (ref 0.1–1.0)
Monocytes Relative: 8 %
NEUTROS PCT: 77 %
Neutro Abs: 7.7 10*3/uL (ref 1.7–7.7)

## 2017-12-01 LAB — URINALYSIS, ROUTINE W REFLEX MICROSCOPIC
BILIRUBIN URINE: NEGATIVE
GLUCOSE, UA: NEGATIVE mg/dL
Hgb urine dipstick: NEGATIVE
KETONES UR: 5 mg/dL — AB
LEUKOCYTES UA: NEGATIVE
Nitrite: NEGATIVE
PH: 6 (ref 5.0–8.0)
Protein, ur: 30 mg/dL — AB
SPECIFIC GRAVITY, URINE: 1.016 (ref 1.005–1.030)

## 2017-12-01 LAB — CBC
HCT: 39.6 % (ref 36.0–46.0)
Hemoglobin: 12.7 g/dL (ref 12.0–15.0)
MCH: 25.4 pg — ABNORMAL LOW (ref 26.0–34.0)
MCHC: 32.1 g/dL (ref 30.0–36.0)
MCV: 79.2 fL (ref 78.0–100.0)
PLATELETS: 381 10*3/uL (ref 150–400)
RBC: 5 MIL/uL (ref 3.87–5.11)
RDW: 17.4 % — AB (ref 11.5–15.5)
WBC: 10 10*3/uL (ref 4.0–10.5)

## 2017-12-01 LAB — I-STAT CHEM 8, ED
BUN: 31 mg/dL — ABNORMAL HIGH (ref 8–23)
BUN: 33 mg/dL — AB (ref 8–23)
CREATININE: 1.1 mg/dL — AB (ref 0.44–1.00)
CREATININE: 1.1 mg/dL — AB (ref 0.44–1.00)
Calcium, Ion: 1.13 mmol/L — ABNORMAL LOW (ref 1.15–1.40)
Calcium, Ion: 1.16 mmol/L (ref 1.15–1.40)
Chloride: 101 mmol/L (ref 98–111)
Chloride: 97 mmol/L — ABNORMAL LOW (ref 98–111)
GLUCOSE: 127 mg/dL — AB (ref 70–99)
GLUCOSE: 117 mg/dL — AB (ref 70–99)
HCT: 38 % (ref 36.0–46.0)
HEMATOCRIT: 42 % (ref 36.0–46.0)
HEMOGLOBIN: 12.9 g/dL (ref 12.0–15.0)
Hemoglobin: 14.3 g/dL (ref 12.0–15.0)
Potassium: 6 mmol/L — ABNORMAL HIGH (ref 3.5–5.1)
Potassium: 8.1 mmol/L (ref 3.5–5.1)
Sodium: 134 mmol/L — ABNORMAL LOW (ref 135–145)
Sodium: 132 mmol/L — ABNORMAL LOW (ref 135–145)
TCO2: 27 mmol/L (ref 22–32)
TCO2: 29 mmol/L (ref 22–32)

## 2017-12-01 LAB — I-STAT TROPONIN, ED: TROPONIN I, POC: 0.16 ng/mL — AB (ref 0.00–0.08)

## 2017-12-01 LAB — APTT: APTT: 22 s — AB (ref 24–36)

## 2017-12-01 LAB — PROTIME-INR
INR: 1.03
Prothrombin Time: 13.4 seconds (ref 11.4–15.2)

## 2017-12-01 LAB — CK: Total CK: 680 U/L — ABNORMAL HIGH (ref 38–234)

## 2017-12-01 LAB — TROPONIN I: Troponin I: 0.13 ng/mL (ref <0.03)

## 2017-12-01 LAB — AMMONIA: AMMONIA: 19 umol/L (ref 9–35)

## 2017-12-01 MED ORDER — LACTATED RINGERS IV BOLUS: 500.0000 mL | Freq: Once | INTRAVENOUS | Status: DC

## 2017-12-01 MED ORDER — ACETAMINOPHEN 160 MG/5ML PO SOLN: 650.0000 mg | ORAL | Status: DC | PRN

## 2017-12-01 MED ORDER — STROKE: EARLY STAGES OF RECOVERY BOOK
Freq: Once | Status: AC
  Administered 2017-12-02: 06:00:00
  Filled 2017-12-01: qty 1

## 2017-12-01 MED ORDER — DIVALPROEX SODIUM ER 250 MG PO TB24: 250.0000 mg | ORAL_TABLET | Freq: Every day | ORAL | Status: DC

## 2017-12-01 MED ORDER — IOPAMIDOL (ISOVUE-370) INJECTION 76%
INTRAVENOUS | Status: AC
  Filled 2017-12-01: qty 50

## 2017-12-01 MED ORDER — RISPERIDONE 2 MG PO TABS: 2.0000 mg | ORAL_TABLET | Freq: Every day | ORAL | Status: DC

## 2017-12-01 MED ORDER — HEPARIN SODIUM (PORCINE) 5000 UNIT/ML IJ SOLN
5000.0000 [IU] | Freq: Three times a day (TID) | INTRAMUSCULAR | Status: DC
  Administered 2017-12-01 – 2017-12-12 (×32): 5000 [IU] via SUBCUTANEOUS
  Filled 2017-12-01 (×32): qty 1

## 2017-12-01 MED ORDER — ACETAMINOPHEN 650 MG RE SUPP: 650.0000 mg | RECTAL | Status: DC | PRN

## 2017-12-01 MED ORDER — SODIUM CHLORIDE 0.9 % IV BOLUS
500.0000 mL | Freq: Once | INTRAVENOUS | Status: AC
  Administered 2017-12-01: 500 mL via INTRAVENOUS

## 2017-12-01 MED ORDER — RISPERIDONE 2 MG PO TBDP
2.0000 mg | ORAL_TABLET | Freq: Every day | ORAL | Status: DC
  Filled 2017-12-01: qty 1

## 2017-12-01 MED ORDER — ASPIRIN EC 81 MG PO TBEC
81.0000 mg | DELAYED_RELEASE_TABLET | Freq: Every day | ORAL | Status: DC
  Administered 2017-12-02 – 2017-12-11 (×7): 81 mg via ORAL
  Filled 2017-12-01 (×8): qty 1

## 2017-12-01 MED ORDER — IOPAMIDOL (ISOVUE-370) INJECTION 76%
50.0000 mL | Freq: Once | INTRAVENOUS | Status: AC | PRN
  Administered 2017-12-01: 50 mL via INTRAVENOUS

## 2017-12-01 MED ORDER — LEVETIRACETAM IN NACL 1500 MG/100ML IV SOLN
1500.0000 mg | Freq: Once | INTRAVENOUS | Status: AC
  Administered 2017-12-02: 1500 mg via INTRAVENOUS
  Filled 2017-12-01: qty 100

## 2017-12-01 MED ORDER — SODIUM CHLORIDE 0.9 % IV SOLN
INTRAVENOUS | Status: AC
  Administered 2017-12-01: 23:00:00 via INTRAVENOUS

## 2017-12-01 MED ORDER — ACETAMINOPHEN 325 MG PO TABS: 650.0000 mg | ORAL_TABLET | ORAL | Status: DC | PRN

## 2017-12-01 NOTE — ED Notes (Signed)
Xray nat bedsdie

## 2017-12-01 NOTE — H&P (Addendum)
[facility] Admission History and Physical Service Pager: [phone]  Patient name: [name] Medical record number: [id number] Date of birth: 23-Dec-1940 Age: 76 y.o. Gender: female  Primary Care Provider: Everrett Coombe, MD Consultants: Neuro Code Status: Full code  Chief Complaint: AMS, right sided weakness, found down  Assessment and Plan: [name] is a 77 y.o. female presenting with AMS and being found down, with right sided weakness. PMH is significant for HCC, Schizoaffective Disorder, Hx Seizure, Chronic D CHF, HLD, HTN, CKD III, GERD.   AMS Patient found down at 3pm today, last known normal at 2pm on 8/18.  Her brother notes that she did not recognize him when he found her on the ground.  Patient able to respond to questions, but slurred speech and does not appear to know who she is or where she is.  UA negative. Glucose WNL. No hypotension, no white count, afebrile, doubt infectious process.  Patient has a history of untreated [place] with elevated LFTs, but liver failure unlikely cause as ammonia 19.  Differential includes TIA vs Stroke, Seizure Disorder, Medication withdrawal of Depakote and/or Risperdal.  Unknown if patient has been taking her medications recently, but has requested recent refills from PCP.  - admit as tele- obs under attending Dr. Erin Hearing - vitals per floor protocol - consult Neuro - patient loaded with Keppra. Will follow up neuro recs. - monitor on telemetry - order MRI brain -NPO pending swallow eval - remainder of workup noted below  Rule out Stroke, new  Last known normal 2 pm on 8/18.  Patient found down at 3pm, was able to respond, but confused and slurred speech.  Right arm weakness, left sided tongue deviation possible as tongue is swollen and appears to have dried blood on tip of tongue, slurred speech could be 2/2 swollen tongue or true dysarthria, able to follow some commands on admission.  CT head negative for acute  bleed.  CTA negative for acute or significant intravascular disease.  Risk factors for stroke include HTN, HLD. ASCVD risk is not applicable due to age. BP to 181/80 upon admission. - order MRI per AMS - Neuro Consult - consider echo in AM as patient is confused and appears scared, believe this would cause further agitation at this time - consider carotid dopplers in AM, pending Neuro recs - permissive hypertension x24 hours - risk strat labs: hgb A1C, lipid, TSH - NPO pending swallow eval - PT/OT  - No statin started at this time (elevated LFTs) - cont home ASA 81mg  QD  Elevated CK CK 680 on admission in the setting of being found down, potentially x24hrs.  Was in ED on 8/17 for weakness and requested Nursing Home placement. Hx Vasovagal syncope from Cards note in January 2019. -150cc NS IVF x 12hrs, monitor fluid status, if still NPO after 12h would continue fluids  Likely fall with Multiple Ecchymoses Patient found lying on her back after potentially 24hrs down.  Multiple ecchymoses including Right tibia with overlying abrasion, left upper chest, right MCPs, right and left side of forehead. Patient did not move with palpation of right hand/arm, unsure if sensation intact.  CXR negative for acute bony abnormalities, Right Tib/Fib X-ray neagtive for acute bony abnormalities, pelvis XR negative for acute abnormality. - cont to monitor pain as mentation improves - avoid Tylenol for pain 2/2 elevated LFTs in the setting of [facility] - can consider imaging of right hand as needed, no concern for fracture at this time -  could consider fentanyl PRN pain while patient is NPO  Known Hepatocellular Carcinoma: Confirmed by Biopsy 2018 Elevated LFTs, AST 115, ALT138.  Patient has refused treatment in the past. Admitting resident is her PCP. She has strongly indicated in multiple conversations in 05/2017 and 11/2017 that she would not want referral to oncology, she would not want any invasive procedures for  further workup or treatment of her cancer. She feels that her cancer is "in God's hands." She has been considering palliative consult as an outpatient but did not feel ready for this yet at her last office visit. - Palliative consult, see Goals of care - avoid hepatotoxic agents  Schizoaffective disorder On Depakote and Risperdal at home.   -Depakote Level pending -Neuro consult -Cont home Risperdal (ODT dissolving tablet ordered because patient is NPO) -Holding home Depakote until AM pending Depakote level and Neuro recs - Will also discuss continuing Depakote with pharmacy in AM  Elevated troponin iSTAT troponin 0.16 on admission. EKG no acute changes, NSR. -Will trend troponins. First troponin I 0.13 - called cards fellow overnight who indicated this is most likely demand ischemia. He recommended that troponin should plateau, and if it continues to rise we should get repeat EKG and reach out to cardiology again.  Hyperkalemia Likely lab error. K 4.4 on admission to 8.1. EKG NSR. K found to be 4.1 on recheck. - continue to monitor AM BMP  Hyponatremia S/P 567ml NS bolus. NA 138 on admission to 132. -Recheck BMP -150cc NS IVF x 12hrs  Goals of care Patient has [place] that she has expressed to PCP she would not like treated and would like to leave in "God's hands."  Brother is unsure of patient's wishes regarding code status but states that he believes "she would want it all done."  Patient was also recently in the ED requesting Nursing Home placement due to weakness. - Palliative care consult  - Social work consult  Hx Seziures 01/12/2013 notes states seizure activity witnessed.  Brother unaware of any history of seizures.  On depakote at home, she has previously said this was for mood control and prescribed by her psychiatrist. Pt has been on 500 mg valproate for many years, recently decreased dose due to elevated LFTs. - Depakote level - Neuro consult - holding home Depakote pending  Neuro recs  Chronic Diastolic CHF Last Echo 11/2503, EF 55-60% with G2DD. - consider repeat Echo in AM, per Rule Out Stroke  HLD No home statin.  Has chronically elevated LFTs 2/2 HCC.  On ASA 81mg  at home. -cont home ASA 81mg  - f/u Neuro recs for treatment  HTN On Diltiazem at home. - Hold home diltiazem -Permissive HTN x24hrs per Rule Out Stroke  CKD III Cr 1.22 on admission, appears at baseline. - cont to monitor BMP - IVF per found down  GERD Protonix at home. - holding protonix due to NPO  FEN/GI: NPO pending speech, 125 cc/hr IVNS x12h Prophylaxis: Heparin  Disposition: admit to telemetry  History of Present Illness:  [name] is a 77 y.o. female presenting with AMS and found down. Patient was last known normal on Sunday 2:30PM when her brother spoke with her on the phone and she seemed normal. Then earlier today at [facility] she was found down by home health aide and her brother. She was lying on the ground face up, her eyes were closed, but she would open to stimulation.  There was no urine or feces noted but she did have bleeding  at the side of her mouth, appeared to have fallen out of a chair. Per brother no known hx of seizures. No recent complaints of pain. Patient unable to provide history due to AMS.   [place] was seen in the ED on Friday night for weakness, was requesting NH placement at that time. Per her brother, she was feeling weak, unable to get up from the bed on her own.   Review Of Systems: unable to assess due to AMS of patient   Patient Active Problem List   Diagnosis Date Noted  . Abnormal LFTs 11/24/2017  . Hepatocellular carcinoma determined by biopsy of liver ([place]) 06/11/2017  . Chronic diastolic CHF (congestive heart failure) ([place]) 10/10/2014  . Palpitations 10/10/2014  . GERD (gastroesophageal reflux disease) 12/30/2013  . Normal coronary arteries 11/09/13 11/10/2013  . Fall 12/12/2011  . SCHIZOAFFECTIVE DISORDER 11/28/2009  . ANEMIA,  NORMOCYTIC, CHRONIC 11/07/2008  . GOUT 05/25/2008  . CHRONIC KIDNEY DISEASE STAGE III (MODERATE) 08/27/2007  . HYPERCHOLESTEROLEMIA 06/12/2006  . BIPOLAR DISORDER 06/12/2006  . Essential hypertension 06/12/2006  . Convulsions/seizures ([place]) 06/12/2006    Past Medical History: Past Medical History:  Diagnosis Date  . Anemia   . Bipolar 1 disorder ([place])   . Chronic diastolic CHF (congestive heart failure) ([place]) 10/10/2014   LVEDP elevated at time of cath  . CKD (chronic kidney disease), stage III ([place])   . Depression   . Gout   . Hypertension   . Normal coronary arteries 2015  . Schizoaffective disorder   . Seizure disorder ([place])   . Thrombocytopenia ([place])   . Vasovagal syncope    a. possible vasovagal syncope in setting of atypical CP 02/2016.    Past Surgical History: Past Surgical History:  Procedure Laterality Date  . APPENDECTOMY    . CARDIAC CATHETERIZATION  2005   Normal  . CARDIAC CATHETERIZATION  11/09/2013   Normal  . LEFT HEART CATHETERIZATION WITH CORONARY ANGIOGRAM N/A 11/09/2013   Procedure: LEFT HEART CATHETERIZATION WITH CORONARY ANGIOGRAM;  Surgeon: Leonie Man, MD;  Location: [facility] CATH LAB;  Service: Cardiovascular;  Laterality: N/A;  . TUBAL LIGATION      Social History: Social History   Tobacco Use  . Smoking status: Never Smoker  . Smokeless tobacco: Never Used  Substance Use Topics  . Alcohol use: No  . Drug use: No   Additional social history: Lives alone with home health aide who checks on her once a day  Please also refer to relevant sections of EMR.  Family History: Family History  Problem Relation Age of Onset  . Heart attack Mother   . Heart disease Mother   . Heart attack Father   . Heart disease Father   . Hypertension Brother      Allergies and Medications: Allergies  Allergen Reactions  . Ativan [Lorazepam] Other (See Comments)    Increases fall risk and was told not to take it.  . Benztropine Mesylate Other (See  Comments)    Restless legs  . Clonazepam Other (See Comments)    Increases fall risk and was told not to take it.  . Vicodin [Hydrocodone-Acetaminophen] Other (See Comments)    Increases fall risk and was told not to take it.  . Lisinopril Other (See Comments)    cough  . Sulfonamide Derivatives Other (See Comments)    Pt does not remember an allergic reaction to sulfa drugs   No current facility-administered medications on file prior to encounter.    Current Outpatient  Medications on File Prior to Encounter  Medication Sig Dispense Refill  . Alum & Mag Hydroxide-Simeth (COMFORT GEL PO) Take 15 mLs by mouth daily as needed (for stomach).     [name] ammonium lactate (AMLACTIN) 12 % cream APPLY 1 GRAM TO THE AFFECTED AREA AS NEEDED FOR DRY SKIN 280 g 0  . aspirin EC 81 MG tablet Take 1 tablet (81 mg total) by mouth daily. 90 tablet 4  . Calcium Citrate-Vitamin D (CALCIUM CITRATE + D3 PO) Take 1 tablet by mouth 3 (three) times daily.     . diclofenac sodium (VOLTAREN) 1 % GEL Apply 2 g topically 2 (two) times daily as needed (pain).    [name] diltiazem (CARDIZEM) 60 MG tablet Take 1 tablet (60 mg total) by mouth 3 (three) times daily. 270 tablet 2  . divalproex (DEPAKOTE ER) 250 MG 24 hr tablet TAKE 1 TABLET(250 MG) BY MOUTH DAILY (Patient taking differently: Take 750 mg by mouth daily. ) 90 tablet 0  . Multiple Vitamins-Minerals (CENTRUM SILVER ADULT 50+ PO) Take 1 tablet by mouth daily.    . nitroGLYCERIN (NITROSTAT) 0.4 MG SL tablet PLACE 1 TABLET UNDER THE THE TONGUE AS DIRECTED EVERY 5 MINUTES AS NEEDED FOR CHEST PAIN 25 tablet 5  . nitroGLYCERIN (NITROSTAT) 0.4 MG SL tablet PLACE 1 TABLET UNDER THE THE TONGUE AS DIRECTED EVERY 5 MINUTES AS NEEDED FOR CHEST PAIN 25 tablet 4  . oxyCODONE (ROXICODONE) 5 MG immediate release tablet Take 0.5 tablets (2.5 mg total) by mouth every 6 (six) hours as needed for severe pain. 10 tablet 0  . pantoprazole (PROTONIX) 40 MG tablet Take 1 tablet (40 mg total) by  mouth daily. 90 tablet 3  . polyethylene glycol powder (GLYCOLAX/MIRALAX) powder Take 17 g by mouth daily. 500 g 0  . Polyvinyl Alcohol-Povidone (REFRESH OP) Place 1-2 drops into both eyes at bedtime as needed (dry eyes).     . risperiDONE (RISPERDAL) 2 MG tablet TAKE 1 TABLET(2 MG) BY MOUTH AT BEDTIME (Patient taking differently: Take 2 mg by mouth at bedtime. ) 90 tablet 0  . sodium chloride (OCEAN) 0.65 % SOLN nasal spray Place 1 spray into both nostrils 2 (two) times daily. 1 Bottle 0  . vitamin C (ASCORBIC ACID) 500 MG tablet Take 500 mg by mouth daily.      Objective: BP (!) 154/87   Pulse (!) 105   Resp 16   SpO2 100%  Exam: General: Elderly female, in NAD, non-diaphoretic Eyes: PERRL, no scleral icterus or conjunctival hemorrhage noted HEENT: Bruise on left and right side of forehead Neck: ROM appears full Cardiovascular: RRR no m/r/g Respiratory: CTA, unable to assess deep breathing as patient unable to follow all commands Gastrointestinal: Soft, non-distended MSK: Moves BLE equally, does not move RUE, ecchymosis of right anterior/medial tibia, no edema, withdraws with palpation of right lower extremities, ecchymosis of right MCPs Derm: Ecchymosis noted on forehead, right lower extremity, right MCPs. No rashes.  1.5cm akin abrasion on right anterior shin. Neuro: A&Ox0, +tongue deviated to left, does not move right arm or respond to palpation of right arm, follows some commands but not all, 4/5 left grip strength, negative b/l Babinski, unable to assess sensation, able to move head to right and left when prompted, hearing appears intact, dysarthria Psych: appears confused, not able to identify her brother, says "I don't know" to most questions, AAOx0  Labs and Imaging: CBC BMET  Recent Labs  Lab 12/11/2017 1700  12/04/2017 1902  WBC 10.0  --   --  HGB 12.7   < > 12.9  HCT 39.6   < > 38.0  PLT 381  --   --    < > = values in this interval not displayed.   Recent Labs  Lab  12/13/2017 1800 12/04/2017 1902  NA 138 132*  K 4.4 8.1*  CL 97* 97*  CO2 26  --   BUN 20 33*  CREATININE 1.22* 1.10*  GLUCOSE 120* 117*  CALCIUM 10.8*  --      Ct Angio Head W Or Wo Contrast  Result Date: 11/13/2017 CLINICAL DATA:  Found on the floor.  Last seen normal yesterday. EXAM: CT ANGIOGRAPHY HEAD AND NECK TECHNIQUE: Multidetector CT imaging of the head and neck was performed using the standard protocol during bolus administration of intravenous contrast. Multiplanar CT image reconstructions and MIPs were obtained to evaluate the vascular anatomy. Carotid stenosis measurements (when applicable) are obtained utilizing NASCET criteria, using the distal internal carotid diameter as the denominator. CONTRAST:  92mL ISOVUE-370 IOPAMIDOL (ISOVUE-370) INJECTION 76% COMPARISON:  CT 11/29/2017 FINDINGS: CTA NECK FINDINGS Aortic arch: Atherosclerosis of the aortic arch. 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 No brachiocephalic vessel origin stenosis identified. Right carotid system: Common carotid artery widely patent to the bifurcation. Mild atherosclerotic plaque at the carotid bifurcation but no stenosis or irregularity. Cervical internal carotid artery widely patent to the skull base. Left carotid system: Common carotid artery widely patent to the bifurcation. Minimal atherosclerotic plaque at the carotid bifurcation but no narrowing or irregularity. Cervical ICA widely patent to the skull base. Vertebral arteries: Right vertebral artery origin widely patent. Mild atherosclerotic plaque at the left vertebral artery origin but without stenosis greater than 25%. Both vertebral arteries widely patent beyond that to the foramen magnum. Skeleton: See report of cervical spine. Other neck: Asymmetry of the tongue. Question if there has been previous partial resection at the base of the tongue on the left. If not, there could be swelling of the right side of the  tongue. Upper chest: Negative Review of the MIP images confirms the above findings CTA HEAD FINDINGS Anterior circulation: Both internal carotid arteries are widely patent through the skull base and siphon regions. There is atherosclerotic calcification in the carotid siphons but no stenosis greater than 30%. The anterior and middle cerebral vessels are patent without proximal stenosis, aneurysm or vascular malformation. No missing distal branch vessels are identified. Posterior circulation: Both vertebral arteries are patent through the foramen magnum to the basilar. No basilar stenosis. Posterior circulation branch vessels are normal. Patent posterior communicating arteries on each side. Venous sinuses: Patent and normal. Anatomic variants: None significant. Delayed phase: No abnormal enhancement. Review of the MIP images confirms the above findings IMPRESSION: No evidence of acute or significant vascular disease. Mild atherosclerosis of the aortic arch and both carotid bifurcations but no stenosis. 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 Asymmetry of the tongue. Question if this could relate to a previous resection of the left tongue base. If this is not the case, there could be swelling of the right side of the tongue. Electronically Signed   By: Nelson Chimes M.D.   On: 11/17/2017 20:07   Dg Tibia/fibula Right  Result Date: 11/26/2017 CLINICAL DATA:  Acute RIGHT LOWER leg pain and swelling. Initial encounter. EXAM: RIGHT TIBIA AND FIBULA - 2 VIEW COMPARISON:  None. FINDINGS: No  acute fracture, subluxation or dislocation. No radiographic evidence of osteomyelitis noted. Mild soft tissue swelling noted. Vascular calcifications are present. Degenerative changes in the knee noted. IMPRESSION: Mild soft tissue swelling without acute bony abnormality. Electronically Signed   By: Margarette Canada M.D.   On: 11/15/2017 17:13    Ct Angio Neck W And/or Wo Contrast  Result Date: 12/08/2017 CLINICAL DATA:  Found on the floor.  Last seen normal yesterday. EXAM: CT ANGIOGRAPHY HEAD AND NECK TECHNIQUE: Multidetector CT imaging of the head and neck was performed using the standard protocol during bolus administration of intravenous contrast. Multiplanar CT image reconstructions and MIPs were obtained to evaluate the vascular anatomy. Carotid stenosis measurements (when applicable) are obtained utilizing NASCET criteria, using the distal internal carotid diameter as the denominator. CONTRAST:  64mL ISOVUE-370 IOPAMIDOL (ISOVUE-370) INJECTION 76% COMPARISON:  CT 11/29/2017 FINDINGS: CTA NECK FINDINGS Aortic arch: Atherosclerosis of the aortic arch. 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 No brachiocephalic vessel origin stenosis identified. Right carotid system: Common carotid artery widely patent to the bifurcation. Mild atherosclerotic plaque at the carotid bifurcation but no stenosis or irregularity. Cervical internal carotid artery widely patent to the skull base. Left carotid system: Common carotid artery widely patent to the bifurcation. Minimal atherosclerotic plaque at the carotid bifurcation but no narrowing or irregularity. Cervical ICA widely patent to the skull base. Vertebral arteries: Right vertebral artery origin widely patent. Mild atherosclerotic plaque at the left vertebral artery origin but without stenosis greater than 25%. Both vertebral arteries widely patent beyond that to the foramen magnum. Skeleton: See report of cervical spine. Other neck: Asymmetry of the tongue. Question if there has been previous partial resection at the base of the tongue on the left. If not, there could be swelling of the right side of the tongue. Upper chest: Negative Review of the MIP images confirms the above findings CTA HEAD FINDINGS Anterior circulation: Both internal carotid arteries are widely  patent through the skull base and siphon regions. There is atherosclerotic calcification in the carotid siphons but no stenosis greater than 30%. The anterior and middle cerebral vessels are patent without proximal stenosis, aneurysm or vascular malformation. No missing distal branch vessels are identified. Posterior circulation: Both vertebral arteries are patent through the foramen magnum to the basilar. No basilar stenosis. Posterior circulation branch vessels are normal. Patent posterior communicating arteries on each side. Venous sinuses: Patent and normal. Anatomic variants: None significant. Delayed phase: No abnormal enhancement. Review of the MIP images confirms the above findings IMPRESSION: No evidence of acute or significant vascular disease. Mild atherosclerosis of the aortic arch and both carotid bifurcations but no stenosis. 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 Asymmetry of the tongue. Question if this could relate to a previous resection of the left tongue base. If this is not the case, there could be swelling of the right side of the tongue. Electronically Signed   By: Nelson Chimes M.D.   On: 11/19/2017 20:07   Dg Pelvis Portable  Result Date: 11/23/2017 CLINICAL DATA:  Altered mental status and pelvic pain. EXAM: PORTABLE PELVIS 1-2 VIEWS COMPARISON:  None. FINDINGS: No acute fracture, subluxation or dislocation. Mild degenerative changes in both hips and LOWER lumbar spine noted. No suspicious bony lesions are present. IMPRESSION: No acute abnormality. Electronically Signed   By: Margarette Canada M.D.   On: 11/18/2017 17:15  Ct C-spine No Charge  Result Date: 11/23/2017 CLINICAL DATA:  Found on the floor.  Last seen normal yesterday. EXAM: CT CERVICAL SPINE WITHOUT CONTRAST TECHNIQUE: Multidetector CT imaging of the cervical spine was performed without intravenous contrast. Multiplanar CT image  reconstructions were also generated. COMPARISON:  None. FINDINGS: Alignment: Normal Skull base and vertebrae: No fracture. There are lucencies near the endplates at anterior inferior C5, anterior superior C6 and superior C7 that are probably Schmorl's nodes. One could not completely rule out the possibility of metastatic disease but that is not favored. Soft tissues and spinal canal: Negative Disc levels: Mild degenerative spondylosis at C5-6, C6-7 and C7-T1 but without compressive narrowing of the canal or foramina. No facet arthropathy. Upper chest: Negative Other: Posteromedial upper ribs appear normal. IMPRESSION: No acute or traumatic finding. Probable endplate Schmorl's nodes at C5, C6 and C7. Electronically Signed   By: Nelson Chimes M.D.   On: 12/07/2017 19:59   Dg Chest Portable 1 View  Result Date: 11/29/2017 CLINICAL DATA:  Altered mental status.  Fall. EXAM: PORTABLE CHEST 1 VIEW COMPARISON:  05/27/2017 and prior radiographs FINDINGS: This is a low volume film. The cardiomediastinal silhouette is unremarkable. There is no evidence of focal airspace disease, pulmonary edema, suspicious pulmonary nodule/mass, pleural effusion, or pneumothorax. No acute bony abnormalities are identified. IMPRESSION: No active disease. Electronically Signed   By: Margarette Canada M.D.   On: 12/04/2017 17:17   Meccariello, Bernita Raisin, DO 11/22/2017, 9:31 PM PGY-1, [place] [facility] pager: [phone], text pages welcome   I have separately seen and examined the patient. I have discussed the findings and exam with Dr. Sandi Carne and agree with the above note.  My changes/additions are outlined in BLUE.   Everrett Coombe, MD PGY-3 [facility]

## 2017-12-01 NOTE — ED Triage Notes (Signed)
Pt arrives EMS from home where she was found on floor by table in living room. Last know normal per brother was 59 yesterday with phone call. Found with home health today.. Right side flaccid. Hematoma to forehead bilateral with discolored right tricep and forearm and left upper arm bruising.

## 2017-12-01 NOTE — ED Notes (Signed)
I Stat Trop I results of 0.16 reported to Dr. Alvino Chapel.
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
## 2017-12-01 NOTE — ED Notes (Signed)
I Stat Chem 8 results reported to [facility], and Dr. Delane Ginger. [place].

## 2017-12-01 NOTE — ED Notes (Signed)
Pt noted as lateral gaze. Dr. Alvino Chapel called to room .

## 2017-12-02 ENCOUNTER — Inpatient Hospital Stay (HOSPITAL_COMMUNITY): Payer: Medicare Other

## 2017-12-02 ENCOUNTER — Other Ambulatory Visit: Payer: Self-pay

## 2017-12-02 DIAGNOSIS — R4182 Altered mental status, unspecified: Secondary | ICD-10-CM

## 2017-12-02 LAB — POCT I-STAT, CHEM 8
BUN: 33 mg/dL — AB (ref 8–23)
CHLORIDE: 97 mmol/L — AB (ref 98–111)
CREATININE: 1.1 mg/dL — AB (ref 0.44–1.00)
Calcium, Ion: 1.16 mmol/L (ref 1.15–1.40)
Glucose, Bld: 117 mg/dL — ABNORMAL HIGH (ref 70–99)
HCT: 38 % (ref 36.0–46.0)
Hemoglobin: 12.9 g/dL (ref 12.0–15.0)
POTASSIUM: 8.1 mmol/L — AB (ref 3.5–5.1)
Sodium: 132 mmol/L — ABNORMAL LOW (ref 135–145)
TCO2: 29 mmol/L (ref 22–32)

## 2017-12-02 LAB — LIPID PANEL
CHOLESTEROL: 155 mg/dL (ref 0–200)
HDL: 42 mg/dL (ref >40)
LDL Cholesterol: 95 mg/dL (ref 0–99)
Total CHOL/HDL Ratio: 3.7 RATIO
Triglycerides: 90 mg/dL (ref <150)
VLDL: 18 mg/dL (ref 0–40)

## 2017-12-02 LAB — CBC
HCT: 35.6 % — ABNORMAL LOW (ref 36.0–46.0)
Hemoglobin: 11.5 g/dL — ABNORMAL LOW (ref 12.0–15.0)
MCH: 25.3 pg — ABNORMAL LOW (ref 26.0–34.0)
MCHC: 32.3 g/dL (ref 30.0–36.0)
MCV: 78.4 fL (ref 78.0–100.0)
PLATELETS: 315 10*3/uL (ref 150–400)
RBC: 4.54 MIL/uL (ref 3.87–5.11)
RDW: 18 % — ABNORMAL HIGH (ref 11.5–15.5)
WBC: 8.7 10*3/uL (ref 4.0–10.5)

## 2017-12-02 LAB — RAPID URINE DRUG SCREEN, HOSP PERFORMED
Amphetamines: NOT DETECTED
Barbiturates: NOT DETECTED
Benzodiazepines: NOT DETECTED
Cocaine: NOT DETECTED
Opiates: NOT DETECTED
TETRAHYDROCANNABINOL: NOT DETECTED

## 2017-12-02 LAB — HEMOGLOBIN A1C
Hgb A1c MFr Bld: 6 % — ABNORMAL HIGH (ref 4.8–5.6)
MEAN PLASMA GLUCOSE: 125.5 mg/dL

## 2017-12-02 LAB — CK: Total CK: 410 U/L — ABNORMAL HIGH (ref 38–234)

## 2017-12-02 LAB — VALPROIC ACID LEVEL: Valproic Acid Lvl: <10 ug/mL — ABNORMAL LOW (ref 50.0–100.0)

## 2017-12-02 LAB — TROPONIN I
TROPONIN I: 0.11 ng/mL — AB (ref <0.03)
Troponin I: 0.08 ng/mL (ref <0.03)

## 2017-12-02 LAB — TSH: TSH: 2.652 u[IU]/mL (ref 0.350–4.500)

## 2017-12-02 MED ORDER — LEVETIRACETAM IN NACL 1000 MG/100ML IV SOLN
1000.0000 mg | Freq: Two times a day (BID) | INTRAVENOUS | Status: DC
  Administered 2017-12-02 – 2017-12-03 (×3): 1000 mg via INTRAVENOUS
  Filled 2017-12-02 (×3): qty 100

## 2017-12-02 MED ORDER — ACETAMINOPHEN 10 MG/ML IV SOLN
650.0000 mg | Freq: Four times a day (QID) | INTRAVENOUS | Status: AC | PRN
  Filled 2017-12-02: qty 100

## 2017-12-02 MED ORDER — PANTOPRAZOLE SODIUM 40 MG PO PACK
40.0000 mg | PACK | Freq: Every day | ORAL | Status: DC
  Administered 2017-12-03 – 2017-12-12 (×7): 40 mg
  Filled 2017-12-02 (×8): qty 20

## 2017-12-02 MED ORDER — SODIUM CHLORIDE 0.9 % IV SOLN
INTRAVENOUS | Status: AC
  Administered 2017-12-02 – 2017-12-03 (×2): via INTRAVENOUS

## 2017-12-02 MED ORDER — FENTANYL 12 MCG/HR TD PT72
12.5000 ug | MEDICATED_PATCH | TRANSDERMAL | Status: DC
  Administered 2017-12-02: 12.5 ug via TRANSDERMAL
  Filled 2017-12-02: qty 1

## 2017-12-02 NOTE — Procedures (Signed)
Date of recording 12/02/2017  Referring physician [place]  Reason for the study Altered mental status  Technical Digital EEG recording using 10-20 international electrode system  Description of the recording When awake posterior dominant rhythm is 8 to 9 Hz symmetrical reactive and well sustained. Mild EEG slowing during drowsiness. Sleep architecture was not seen.  Episodes of eye fluttering and not answering were not associated with Epileptiform activity  Impression The EEG is normal with patient recorded in awake and drowsy state

## 2017-12-02 NOTE — Progress Notes (Signed)
Spoke with Cardiology fellow on call for [place].  Made aware of elevated troponin.  He states that patient had a normal cath in 2015 per prior cardiology note, EKG no change today, likely not CAD causing increase in troponin.  Is likely 2/2 demand ischemia.  He stated that he suspects troponin will plateau and no need for heparinization at this time.  Recommends trending troponins and if starts to rise, get EKG and call Cardiology again.  -Appreciate recommendations from Cardiology -Will continue to trend trops -Will order EKG if increases and will call cardiology again.  [place] Constable, D.O.  PGY-1 Family Medicine  12/02/2017 12:12 AM

## 2017-12-02 NOTE — Care Management Note (Signed)
Case Management Note  Patient Details  Name: [name] MRN: [id number] Date of Birth: 07-17-1940  Subjective/Objective:    Pt in with weakness that was found down at home. She lives alone but her brother checks on her.                 Action/Plan: Awaiting PT/OT evals. CM following for d/c disposition.   Expected Discharge Date:                  Expected Discharge Plan:     In-House Referral:     Discharge planning Services     Post Acute Care Choice:    Choice offered to:     DME Arranged:    DME Agency:     HH Arranged:    HH Agency:     Status of Service:  In process, will continue to follow  If discussed at Long Length of Stay Meetings, dates discussed:    Additional Comments:  Pollie Friar, RN 12/02/2017, 11:37 AM

## 2017-12-02 NOTE — Progress Notes (Signed)
MRI completed, revealing the following per Radiology report: MRI HEAD IMPRESSION: 1. Question subtle gyriform diffusion abnormality involving the parasagittal left parietooccipital region and possibly left thalamus. Primary consideration would be subtle changes related to seizure. Correlation with history and EEG suggested. Sequelae of mild ischemia would be the primary differential consideration. 2. Moderate atrophy with mild chronic small vessel ischemic disease.  MRA HEAD IMPRESSION: Stable appearance of the intracranial circulation. No large vessel occlusion. No hemodynamically significant or correctable stenosis.  I have personally reviewed the images and agree with the Radiologist's impression.   MRI findings are consistent with seizures. No changes to current plan.   Electronically signed: Dr. Kerney Elbe

## 2017-12-02 NOTE — Consult Note (Signed)
Neurology Consultation Reason for Consult: Right-sided weakness Referring Physician: Christianne Borrow  CC: Right-sided weakness  History is obtained from: Family member  HPI: [name] is a 77 y.o. female with a history of bipolar disorder as well as seizure disorder who has been diagnosed with hepatocellular carcinoma but is not currently receiving treatment or investigations.  Palliative care has been discussed, but she has not requested to pursue this option yet.  Due to this her liver enzymes were increasing and therefore her Depakote was being weaned by her psychiatrist.  She was last definitely in her normal state of health last Thursday, however she did talk to her family member on the phone on Sunday.  Today, her family member checked on her and found her on the ground, and she appeared to have been there for quite some time due to some bruising.  ROS:  Unable to obtain due to altered mental status.   Past Medical History:  Diagnosis Date  . Anemia   . Bipolar 1 disorder ([place])   . Chronic diastolic CHF (congestive heart failure) ([place]) 10/10/2014   LVEDP elevated at time of cath  . CKD (chronic kidney disease), stage III ([place])   . Depression   . Gout   . Hypertension   . Normal coronary arteries 2015  . Schizoaffective disorder   . Seizure disorder ([place])   . Thrombocytopenia ([place])   . Vasovagal syncope    a. possible vasovagal syncope in setting of atypical CP 02/2016.     Family History  Problem Relation Age of Onset  . Heart attack Mother   . Heart disease Mother   . Heart attack Father   . Heart disease Father   . Hypertension Brother      Social History:  reports that she has never smoked. She has never used smokeless tobacco. She reports that she does not drink alcohol or use drugs.   Exam: Current vital signs: BP (!) 172/89   Pulse (!) 105   Resp (!) 25   SpO2 99%  Vital signs in last 24 hours: Pulse Rate:  [100-114] 105 (08/20 0030) Resp:  [15-42]  25 (08/20 0030) BP: (154-193)/(76-104) 172/89 (08/20 0030) SpO2:  [95 %-100 %] 99 % (08/20 0030)   Physical Exam  Constitutional: Appears well-developed and well-nourished.  Psych: Affect appropriate to situation Eyes: No scleral injection HENT: No OP obstrucion Head: Normocephalic.  Cardiovascular: Normal rate and regular rhythm.  Respiratory: Effort normal, non-labored breathing GI: Soft.  No distension. There is no tenderness.  Skin: Some abrasions over her elbows  Neuro: Mental Status: Patient is awake, alert, she is densely a phasic Cranial Nerves: II: Does not blink to threat from the right. Pupils are equal, round, and reactive to light.   III,IV, VI: Left gaze preference but does cross midline to the right V: Facial sensation is symmetric to temperature VII: Facial movement with left facial weakness VIII: hearing is intact to voice X: Uvula elevates symmetrically XI: Shoulder shrug is symmetric. XII: tongue is midline without atrophy or fasciculations.  Motor: Tone is normal. Bulk is normal.  She has weakness of the right arm and leg, but does withdrawal to noxious stimuli, normal movements of the left Sensory: She responds less to noxious stimuli on the right than the left Cerebellar: FNF intact bilaterally  I have reviewed labs in epic and the results pertinent to this consultation are: VPA level-less than 10  I have reviewed the images obtained: CTA head and neck-no  large vessel occlusion  Impression: 77 year old female with signs and symptoms that would be most consistent with a ischemic stroke on the left, though postictal Paraschos is possible and she does have a history of similar postictal gradually improving states.  She has been decreased on her Depakote due to LFTs and therefore I would start a new antiepileptic at this time.  Recommendations: 1) MRI brain 2) EEG 3) Keppra 1500 mg x 1 then 1 g twice daily 4) neurology will continue to  follow   Roland Rack, MD [facility] [phone]  If 7pm- 7am, please page neurology on call as listed in [place].

## 2017-12-02 NOTE — Progress Notes (Addendum)
CALL PAGER [phone] for any questions or notifications regarding this patient   FMTS Attending Daily Note: [name] Singh, MD  Pager [phone]  Office [phone] I have seen and examined this patient, reviewed their chart. I have discussed this patient with the resident. I agree with the resident's findings, assessment and care plan. On my exam, patient quite, multiple blinking episodes, moving mandible back and forth. EEG leads in place.   Family Medicine Teaching Service Daily Progress Note Intern Pager: [phone]  Patient name: [name] Medical record number: [id number] Date of birth: 18-Nov-1940 Age: 77 y.o. Gender: female  Primary Care Provider: Everrett Coombe, MD  Code Status: full but needs to be clarified Consultations: neurology, PT/OT, speech Admission date: 8/19  Pt Overview and Major Events to Date:  8/19 admitted for AMS, found down up to 24 hours  Subjective: Patient was admitted overnight. Today: Patient was yelling "Jesus" repeatedly when I approached the room. As I spoke to her and tried to re-orient her, she began to calm down and stopped yelling. She did look at me while I spoke but was not able to answer any orientation questions. By the end of my exam, she was able to give one or two word appropriate answers to questions occasionally.   Objective: Temp:  [97.9 F (36.6 C)-98.6 F (37 C)] 97.9 F (36.6 C) (08/20 1110) Pulse Rate:  [99-117] 102 (08/20 1110) Resp:  [15-42] 20 (08/20 1110) BP: (141-193)/(68-104) 170/78 (08/20 1110) SpO2:  [95 %-100 %] 100 % (08/20 1110) Weight:  [60.2 kg] 60.2 kg (08/20 0305)  Physical Exam: General: patient was distressed and anxious appearing, patient had frequent and exaggerated blinking Cardiovascular: rapid heart rate with regular rhythm and a possible murmer that was difficult to auscultate over her continuous talking/yelling Respiratory: no adventitious sounds were appreciated but patient was not able to follow commands for  a thorough exam  Abdomen: soft, non-distended, no masses. Palpation did not seem to illicit a pain response from patient. There was no skin changes here Neuro: patient was not oriented to person, place, time Extremities: patient had multiple areas on extremities that appeared to be non blanchable, non-raised ecchymoses. See images below. Bilateral head injuries with some swelling and erythema (right worse than left)            Assessment and Plan: [name] is a 77 y.o. female presenting with AMS and being found down with last known normal on Sunday 2:30pm. PMH is significant for HCC, Schizoaffective Disorder, Hx Seizure, Chronic D CHF, HLD, HTN, CKD III, GERD.   Acute Encephalopathy, uncertain etiology, new. Differential includes seizure, metabolic encephalopathy, acute stroke (although this is less likely).  - continue on telemetry - vitals per floor protocol q4h  - follow neuro recs  -interpreted MRI findings as consistent with seizures  - ordered EEG today  -Keppra 1500mg  given once yesterday  -Keppra 1000mg  BID starting today  -neuro will continue to follow -ordered UDS -speech therapy recs  -dysphagia 1 (puree); thin liquid diet - Neuro Consulted and following - consider echo when patient is more oriented - consider carotid dopplers pending Neuro recs -consider adding on BP medication since BP remains elevated - PT/OT consulted - cont home ASA 81mg  daily  Elevated CK CK 680 on admission in the setting of being found down, potentially x24hrs.  -trend CK until it declines -150cc NS IVF continuous until CK decreases and good oral intake  Fall at Home, unwitnessed.  Patient found lying on her back  after potentially 24hrs down.  See clinical pictures for more detail. CXR negative for acute bony abnormalities, Right Tib/Fib X-ray neagtive for acute bony abnormalities, pelvis XR negative for acute abnormality. Was in ED on 8/17 for weakness and requested Nursing Home  placement. Hx Vasovagal syncope from Cards note in January 2019. Lives alone with home health and family to check on her. - can consider imaging of right hand as needed, no concern for fracture at this time -ordered fall precautions - Right elbow and humerus x-ray   Known Hepatocellular Carcinoma: Confirmed by Biopsy 2018 Elevated LFTs, AST 115, ALT138.  Patient has refused treatment in the past. Admitting resident is her PCP. She has strongly indicated in multiple conversations in 05/2017 and 11/2017 that she would not want referral to oncology, she would not want any invasive procedures for further workup or treatment of her cancer. She feels that her cancer is "in God's hands." She has been considering palliative consult as an outpatient but did not feel ready for this yet at her last office visit. - Palliative consulted, see Goals of care - avoid hepatotoxic agents -monitor CMPs  Schizoaffective disorder On Depakote and Risperdal at home. Depakote Level <10 -Neuro consulted  -started Keppra -discontinue home risperidone -Holding home Depakote  Elevated troponin iSTAT troponin 0.16 on admission. EKG no acute changes, NSR. cards fellow overnight consulted indicated this is most likely demand ischemia -troponins trended down- no longer trending  Hyponatremia Resolved NS bolus and maintenance fluids. Na+ 138 on admission to 132. Na+ 138 on last BMP -Recheck BMP -150cc NS IVF  Goals of care Patient has [place] that she has expressed to PCP she would not like treated and would like to leave in "God's hands."  Brother is unsure of patient's wishes regarding code status but states that he believes "she would want it all done."  Patient was also recently in the ED requesting Nursing Home placement due to weakness. - Palliative care consult  - Social work consult -monitor when patient is more alert and oriented  Hx Seziures 01/12/2013 notes states seizure activity witnessed.  Brother  unaware of any history of seizures.  On depakote at home - follow Neuro recs - holding home Depakote pending Neuro recs  Chronic Diastolic CHF Last Echo 10/2534, EF 55-60% with G2DD. - consider repeat Echo when patient is more oriented  HLD No home statin.  Has chronically elevated LFTs 2/2 HCC.  On ASA 81mg  at home. -cont home ASA 81mg  - f/u Neuro recs for treatment  HTN On Diltiazem at home. - Hold home diltiazem -Permissive HTN x24hrs per Rule Out Stroke  CKD III Cr 1.22 on admission, appears at baseline. - cont to monitor BMP - IVF per found down  GERD Protonix at home. - start protonix tomorrow as speech has evaluated and added on a diet  FEN/GI: dysphagia 1, 18mL/hr NS until CK trends down and good oral intake Prophylaxis: Heparin  Disposition: admit to telemetry  Laboratory: Recent Labs  Lab 11/29/17 0321 11/26/2017 1700 12/12/2017 1708 12/03/2017 1902  WBC 7.6 10.0  --   --   HGB 10.7* 12.7 14.3 12.9  12.9  HCT 32.4* 39.6 42.0 38.0  38.0  PLT 268 381  --   --    Recent Labs  Lab 11/29/17 0321  12/05/2017 1800 11/30/2017 1902 11/14/2017 2200  NA 137   < > 138 132*  132* 138  K 4.0   < > 4.4 8.1*  8.1* 4.3  CL 100   < >  97* 97*  97* 102  CO2 26  --  26  --  23  BUN 15   < > 20 33*  33* 21  CREATININE 1.05*   < > 1.22* 1.10*  1.10* 1.12*  CALCIUM 10.4*  --  10.8*  --  9.8  PROT  --   --  7.5  --   --   BILITOT  --   --  1.1  --   --   ALKPHOS  --   --  217*  --   --   ALT  --   --  138*  --   --   AST  --   --  115*  --   --   GLUCOSE 119*   < > 120* 117*  117* 103*   < > = values in this interval not displayed.    Imaging/Diagnostic Tests: CTA head, neck, MR head Details in plan  Richarda Osmond, DO 12/02/2017, 1:36 PM PGY-1, [place] Intern pager: [phone], text pages welcome

## 2017-12-02 NOTE — NC FL2 (Signed)
MEDICAID [id number] LEVEL OF CARE SCREENING TOOL     IDENTIFICATION  Patient Name: [name] Birthdate: December 21, 1940 Sex: female Admission Date (Current Location): 11/30/2017  [place] and [place] Number:  Kathleen Argue [id number] [place] and Address:  [facility], [place] [address], [place], [place] [address]      Provider Number: [id number]  Attending Physician Name and Address:  Lind Covert, MD  Relative Name and Phone Number:  Elenore Paddy, brother, [phone]    Current Level of Care: Hospital Recommended Level of Care: [place] Prior Approval Number:    Date Approved/Denied:   PASRR Number: pending  Discharge Plan: SNF    Current Diagnoses: Patient Active Problem List   Diagnosis Date Noted  . Altered mental status   . Weakness 12/05/2017  . Abnormal LFTs 11/24/2017  . Hepatocellular carcinoma determined by biopsy of liver ([place]) 06/11/2017  . Chronic diastolic CHF (congestive heart failure) ([place]) 10/10/2014  . Palpitations 10/10/2014  . GERD (gastroesophageal reflux disease) 12/30/2013  . Normal coronary arteries 11/09/13 11/10/2013  . Fall 12/12/2011  . SCHIZOAFFECTIVE DISORDER 11/28/2009  . ANEMIA, NORMOCYTIC, CHRONIC 11/07/2008  . GOUT 05/25/2008  . CHRONIC KIDNEY DISEASE STAGE III ([place]) 08/27/2007  . HYPERCHOLESTEROLEMIA 06/12/2006  . BIPOLAR DISORDER 06/12/2006  . Essential hypertension 06/12/2006  . Convulsions/seizures ([place]) 06/12/2006    Orientation RESPIRATION BLADDER Height & Weight     Self  Normal Incontinent, External catheter Weight: 132 lb 11.5 oz (60.2 kg) Height:     BEHAVIORAL SYMPTOMS/MOOD NEUROLOGICAL BOWEL NUTRITION STATUS      Incontinent Diet(see discharge summary)  AMBULATORY STATUS COMMUNICATION OF NEEDS Skin   Extensive Assist Verbally Normal                       Personal Care Assistance Level of Assistance  Bathing, Feeding, Dressing Bathing Assistance: Maximum  assistance Feeding assistance: Independent Dressing Assistance: Maximum assistance     Functional Limitations Info  Sight, Hearing, Speech Sight Info: Adequate Hearing Info: Adequate Speech Info: Adequate    SPECIAL CARE FACTORS FREQUENCY  PT (By licensed PT), OT (By licensed OT)     PT Frequency: 5x week OT Frequency: 5x week            Contractures Contractures Info: Not present    Additional Factors Info  Code Status, Allergies Code Status Info: Full Code Allergies Info: ATIVAN LORAZEPAM, BENZTROPINE MESYLATE, CLONAZEPAM, VICODIN HYDROCODONE-ACETAMINOPHEN, LISINOPRIL, SULFONAMIDE DERIVATIVES            Current Medications (12/02/2017):  This is the current hospital active medication list Current Facility-Administered Medications  Medication Dose Route Frequency Provider Last Rate Last Dose  . 0.9 %  sodium chloride infusion   Intravenous Continuous Anderson, Chelsey L, DO 125 mL/hr at 12/02/17 1612    . aspirin EC tablet 81 mg  81 mg Oral Daily Meccariello, Bernita Raisin, DO   81 mg at 12/02/17 1029  . fentaNYL (DURAGESIC - dosed mcg/hr) 12.5 mcg  12.5 mcg Transdermal Q72H Darrelyn Hillock N, DO   12.5 mcg at 12/02/17 1611  . heparin injection 5,000 Units  5,000 Units Subcutaneous Q8H Meccariello, Bernita Raisin, DO   5,000 Units at 12/02/17 1343  . levETIRAcetam (KEPPRA) IVPB 1000 mg/100 mL premix  1,000 mg Intravenous BID Greta Doom, MD 400 mL/hr at 12/02/17 1029 1,000 mg at 12/02/17 1029  . [START ON 12/03/2017] pantoprazole sodium (PROTONIX) 40 mg/20 mL oral suspension 40 mg  40 mg  Per Tube Daily Richarda Osmond, DO         Discharge Medications: Please see discharge summary for a list of discharge medications.  Relevant Imaging Results:  Relevant Lab Results:   Additional Information SS# [place], [place]

## 2017-12-02 NOTE — Progress Notes (Signed)
EEG complete - results pending 

## 2017-12-02 NOTE — Evaluation (Signed)
Speech Language Pathology Evaluation Patient Details Name: [name] MRN: [id number] DOB: 04/07/1941 Today's Date: 12/02/2017 Time: 0850-0900 SLP Time Calculation (min) (ACUTE ONLY): 10 min  Problem List:  Patient Active Problem List   Diagnosis Date Noted  . Altered mental status   . Weakness 11/19/2017  . Abnormal LFTs 11/24/2017  . Hepatocellular carcinoma determined by biopsy of liver ([place]) 06/11/2017  . Chronic diastolic CHF (congestive heart failure) ([place]) 10/10/2014  . Palpitations 10/10/2014  . GERD (gastroesophageal reflux disease) 12/30/2013  . Normal coronary arteries 11/09/13 11/10/2013  . Fall 12/12/2011  . SCHIZOAFFECTIVE DISORDER 11/28/2009  . ANEMIA, NORMOCYTIC, CHRONIC 11/07/2008  . GOUT 05/25/2008  . CHRONIC KIDNEY DISEASE STAGE III ([place]ER[place]) 08/27/2007  . HYPERCHOLESTEROLEMIA 06/12/2006  . BIPOLAR DISORDER 06/12/2006  . Essential hypertension 06/12/2006  . Convulsions/seizures ([place]) 06/12/2006   Past Medical History:  Past Medical History:  Diagnosis Date  . Anemia   . Bipolar 1 disorder ([place])   . Chronic diastolic CHF (congestive heart failure) ([place]) 10/10/2014   LVEDP elevated at time of cath  . CKD (chronic kidney disease), stage III ([place])   . Depression   . Gout   . Hypertension   . Normal coronary arteries 2015  . Schizoaffective disorder   . Seizure disorder ([place])   . Thrombocytopenia ([place])   . Vasovagal syncope    a. possible vasovagal syncope in setting of atypical CP 02/2016.   Past Surgical History:  Past Surgical History:  Procedure Laterality Date  . APPENDECTOMY    . CARDIAC CATHETERIZATION  2005   Normal  . CARDIAC CATHETERIZATION  11/09/2013   Normal  . LEFT HEART CATHETERIZATION WITH CORONARY ANGIOGRAM N/A 11/09/2013   Procedure: LEFT HEART CATHETERIZATION WITH CORONARY ANGIOGRAM;  Surgeon: Leonie Man, MD;  [facility];  Service: Cardiovascular;  Laterality: N/A;  . TUBAL LIGATION     HPI:  77 y.o. female  admitted from home by family with acute MS changes and L sided weakness - MRI consistent with seizure per most recent neuro notes    Assessment / Plan / Recommendation Clinical Impression   Pt presents with functional communication impairments which are likely secondary to cognitive deficits s/p acute mental status changes.  Pt is oriented to self only and has decreased sustained attention to tasks which impact all higher level cognitive processes.  Her verbal expression is characterized by stereotypy with repetitive verbalization of "ay" in response to all questions.  Pt could follow 1 step commands in a functional context in ~50% of opportunities.  Unclear of pt's baseline as family was unable to report; however, it seems as pt was living alone and able to communicate her wishes to medical team regarding not wanting to treat her cancer.  SLP is hopeful that as pt's mental status improves that her cognitive-linguistic function will return to baseline.      SLP Assessment  SLP Recommendation/Assessment: Patient needs continued Speech Lanaguage Pathology Services SLP Visit Diagnosis: Cognitive communication deficit (R41.841)    Follow Up Recommendations  Other (comment)(to be determined pending progress made while inpatient)    Frequency and Duration min 2x/week         SLP Evaluation Cognition  Overall Cognitive Status: No family/caregiver present to determine baseline cognitive functioning Arousal/Alertness: Awake/alert(became lethargice towards the end of evaluation) Orientation Level: Oriented to person;Disoriented to place;Disoriented to time;Disoriented to situation Attention: Sustained Sustained Attention: Impaired Sustained Attention Impairment: Functional basic;Verbal basic Memory: (difficult to assess, presumed impaired) Awareness: Impaired Awareness Impairment:  Intellectual impairment Problem Solving: Impaired Problem Solving Impairment: Functional basic;Verbal basic Executive  Function: (all impaired due to lower level deficits) Behaviors: Other (comment)(anxious) Safety/Judgment: Impaired       Comprehension  Auditory Comprehension Overall Auditory Comprehension: Impaired Yes/No Questions: Impaired Basic Biographical Questions: 0-25% accurate Basic Immediate Environment Questions: 0-24% accurate Commands: Impaired One Step Basic Commands: 50-74% accurate(in a functional context) Interfering Components: Anxiety    Expression Expression Primary Mode of Expression: Verbal(stereotypy) Verbal Expression Overall Verbal Expression: Impaired Initiation: No impairment Automatic Speech: Social Response Level of Generative/Spontaneous Verbalization: Word Repetition: Impaired Level of Impairment: Word level Naming: Impairment Confrontation: Impaired Pragmatics: Impairment Impairments: Eye contact   Oral / Motor  Oral Motor/Sensory Function Overall Oral Motor/Sensory Function: Other (comment)(difficult to assess due to cognition ) Motor Speech Overall Motor Speech: Impaired Respiration: Within functional limits Phonation: Normal Resonance: Within functional limits Articulation: Within functional limitis Intelligibility: (difficult to assess) Motor Speech Errors: Unaware(repetitive production of "ay" in response to all questions )   GO                    Basel Defalco, [name] 12/02/2017, 9:32 AM

## 2017-12-02 NOTE — Evaluation (Addendum)
Occupational Therapy Evaluation Patient Details Name: [name] MRN: [id number] DOB: 12-Aug-1940 Today's Date: 12/02/2017    History of Present Illness 77 y.o. female presenting with AMS and being found down, with right sided weakness. PMH is significant for HCC, bipolar 1 disorder, Schizoaffective Disorder, Hx Seizure, Chronic D CHF, HLD, HTN, CKD III, GERD. MRI revealed Question subtle gyriform diffusion abnormality involving the parasagittal left parietooccipital region and possibly left thalamus with Primary consideration would be subtle changes related to seizure. No large vessel occlusion.   Clinical Impression   This 77 y/o female presents with the above. Pt unable to provide PLOF and no family present at time of eval, though per chart review pt in from home, appears to have been living alone. Pt presenting with cognitive impairments, R side weakness, decreased sitting balance. Pt requiring maxA+2 for bed mobility and mod-maxA to maintain static sitting balance EOB. Pt requiring max hand over hand assist to perform face washing task, currently requires totalA for all other ADLs. Pt follows approx 50% of one step commands this session; does not attempt to use RUE and appears to have decreased attention to R side. Pt will benefit from continued acute OT services and recommend follow up therapy services in SNF setting after discharge to maximize her overall safety and independence with ADLs and mobility. Will follow.     Follow Up Recommendations  SNF;Supervision/Assistance - 24 hour    Equipment Recommendations  Other (comment)(TBD in next venue )           Precautions / Restrictions Precautions Precautions: Fall Precaution Comments: seizure precautions  Restrictions Weight Bearing Restrictions: No      Mobility Bed Mobility Overal bed mobility: Needs Assistance Bed Mobility: Rolling;Supine to Sit;Sit to Supine Rolling: Max assist;+2 for physical assistance;+2 for  safety/equipment   Supine to sit: Max assist;+2 for physical assistance;+2 for safety/equipment Sit to supine: Max assist;+2 for physical assistance;+2 for safety/equipment   General bed mobility comments: assist for LEs over EOB and to elevate trunk with multimodal cues utilized to have pt assist with advance LEs and reaching with UE; pt requires assist to maintain static sitting balance   Transfers                      Balance Overall balance assessment: Needs assistance Sitting-balance support: Single extremity supported;Feet supported Sitting balance-Leahy Scale: Poor Sitting balance - Comments: reliant on mod-maxA to maintain static sitting balance  Postural control: Posterior lean;Left lateral lean                                 ADL either performed or assessed with clinical judgement   ADL Overall ADL's : Needs assistance/impaired Eating/Feeding: NPO   Grooming: Maximal assistance;Sitting Grooming Details (indicate cue type and reason): max hand over hand assist to wash face this session using LUE seated EOB; pt also requires assist to static sitting balance                                General ADL Comments: pt requires totalA for all other aspects of ADLs and mobility at this time      Vision Baseline Vision/History: Wears glasses Additional Comments: pt responds "yes" when asked if she wears glassess. pt with significant blinking during session, does not appear to attend to R side despite cues to turn head  towards therapist on the R      Perception     Praxis      Pertinent Vitals/Pain Pain Assessment: Faces Faces Pain Scale: No hurt Pain Intervention(s): Monitored during session     Hand Dominance     Extremity/Trunk Assessment Upper Extremity Assessment Upper Extremity Assessment: RUE deficits/detail;LUE deficits/detail RUE Deficits / Details: increased tone in RUE noted; pt demonstrates purposeful movements wtih LUE  but no attempts to move RUE RUE Coordination: decreased gross motor;decreased fine motor LUE Deficits / Details: generalized weakness  LUE Coordination: decreased gross motor   Lower Extremity Assessment Lower Extremity Assessment: Defer to PT evaluation       Communication Communication Communication: Expressive difficulties   Cognition Arousal/Alertness: Awake/alert Behavior During Therapy: Flat affect Overall Cognitive Status: No family/caregiver present to determine baseline cognitive functioning Area of Impairment: Following commands;Awareness                       Following Commands: Follows one step commands inconsistently;Follows one step commands with increased time   Awareness: Intellectual   General Comments: pt following one step commands approx 50% of the time; intermittently responding to questions, often with "yes"; pt does smile appropriately with therapists when attempting to engage pt in conversation; of note pt does tend to scream out upon initial waking suspect due to unfamiliar environment    General Comments       Exercises     Shoulder Instructions      Home Living Family/patient expects to be discharged to:: Private residence Living Arrangements: Alone   Type of Home: Apartment                           Additional Comments: PLOF gleaned from chart review as pt not able to provide information and no family present; attempted to call phone number listed on facesheet but no answer at this time   Lives With: Alone    Prior Functioning/Environment                   OT Problem List: Decreased strength;Impaired balance (sitting and/or standing);Decreased cognition;Decreased range of motion;Decreased activity tolerance;Impaired UE functional use;Impaired tone      OT Treatment/Interventions: Self-care/ADL training;DME and/or AE instruction;Therapeutic activities;Balance training;Therapeutic exercise;Patient/family education     OT Goals(Current goals can be found in the care plan section) Acute Rehab OT Goals Patient Stated Goal: none stated  OT Goal Formulation: Patient unable to participate in goal setting Time For Goal Achievement: 12/16/17 Potential to Achieve Goals: Fair  OT Frequency: Min 2X/week   Barriers to D/C:            Co-evaluation PT/OT/SLP Co-Evaluation/Treatment: Yes Reason for Co-Treatment: For patient/therapist safety;To address functional/ADL transfers;Necessary to address cognition/behavior during functional activity;Complexity of the patient's impairments (multi-system involvement)   OT goals addressed during session: ADL's and self-care;Strengthening/ROM      AM-PAC PT "6 Clicks" Daily Activity     Outcome Measure Help from another person eating meals?: Total Help from another person taking care of personal grooming?: A Lot Help from another person toileting, which includes using toliet, bedpan, or urinal?: Total Help from another person bathing (including washing, rinsing, drying)?: Total Help from another person to put on and taking off regular upper body clothing?: Total Help from another person to put on and taking off regular lower body clothing?: Total 6 Click Score: 7   End of Session Nurse Communication: Mobility  status  Activity Tolerance: Patient tolerated treatment well;Patient limited by fatigue Patient left: in bed;with call bell/phone within reach;with bed alarm set  OT Visit Diagnosis: Muscle weakness (generalized) (M62.81);Other symptoms and signs involving the nervous system (R29.898);Other symptoms and signs involving cognitive function                Time: 5176-1607 OT Time Calculation (min): 18 min Charges:  OT General Charges $OT Visit: 1 Visit OT Evaluation $OT Eval Moderate Complexity: 1 Mod  Lou Cal, [place] Pager [phone] 12/02/2017  Raymondo Band 12/02/2017, 10:25 AM

## 2017-12-02 NOTE — Progress Notes (Signed)
Pt Fentanyl patch removed and wasted with RN Olivia Mackie. Delia Heady RN

## 2017-12-02 NOTE — Consult Note (Signed)
Consultation Note Date: 12/02/2017   Patient Name: [name]  DOB: [birth year]  MRN: [id number]  Age / Sex: 77 y.o., female  PCP: Everrett Coombe, MD Referring Physician: Lind Covert, MD  Reason for Consultation: Establishing goals of care and Psychosocial/spiritual support  HPI/Patient Profile: 77 y.o. female  admitted on 11/13/2017 with AMS and being found down, with right sided weakness. Patient found down by her brother, possible down time is up to 24 hrs.  Her brother notes that she did not recognize him when he found her on the ground.  Multiple areas of bruising noted.  PMH is significant for HCC, Schizoaffective Disorder/BiPolar (unsure of true diagnosis), Hx Seizure, Chronic D CHF, HLD, HTN, CKD III, GERD and  Hepatocellular carcinoma- confirmed with liver biopsy February 2018- untreated  Patient admitted for further evaluation and treatment   CTA -head IMPRESSION: No acute intracranial abnormalities. Chronic atrophy and small vessel ischemic changes The EEG isnormal with patient recorded in awake and drowsy state  Patient remains nonverbal and unable to follow commands. She is tolerating meals but needs to be fed.  Family face treatment option decisions, advanced directive decisions and anticipatory care needs.   Clinical Assessment and Goals of Care:  This NP Wadie Lessen reviewed medical records, received report from team, assessed the patient and then meet at the patient's bedside along with her brother/Mr Valentino Nose and her daughter Barnett Applebaum by telephone  to discuss diagnosis, prognosis, GOC, EOL wishes disposition and options. Concept of  Palliative Care was discussed  Daughter Lynett Fish # [phone]  A long discussion was had today with the family regarding current medical situation.  Patient's daughter has not seen her mother in person since last Thanksgiving however she tells  me she speaks to her several times a week by phone.  Patient has been living alone with the support of her brother who lives nearby.  Patient's daughter was somewhat surprised by the information of a confirmed by biopsy, a liver adenocarcinoma diagnosed in February 2018.  Patient has been adamant in her decision to not do any further diagnostics or any treatment for the cancer diagnosis.  After some discussion the daughter did verbalize that the patient said to her "if you love me you would not talk about this cancer ever again."  Family  honor the patient's  wishes.  A  discussion was had today regarding advanced directives.  Concepts specific to code status, artifical feeding and hydration, continued IV antibiotics and rehospitalization was had.  The difference between a aggressive medical intervention path  and a palliative comfort care path for this patient at this time was had.  Values and goals of care important to patient and family were attempted to be elicited.  We discussed in detail the importance of medical decisions as it relates to future care needs.  Depending on decisions,  options of skilled nursing facility versus residential hospice was discussed.  MOST form introduced.  Hard Choices booklet left for review  Natural trajectory and expectations at EOL  were discussed.  Questions and concerns addressed.   Family encouraged to call with questions or concerns.    PMT will continue to support holistically.    Daughter plans to come to [facility] as soon as she can to  put logistics into place.  She hopes to make it in town by  This coming  Friday or Saturday.  Daughter agrees to meet with me Monday August 26 at 1 PM if patient remains in the hospital until that time.  NEXT OF KIN -no documented  HPOA-daughter Barnett Applebaum tells me that she believes there is an advanced directive; she will look for and bring to [facility] when she comes.  SUMMARY OF RECOMMENDATIONS    Code Status/Advance  Care Planning:  Full code-encouraged family to consider DNR/DNI status knowing poor outcomes in similar patients if resuscitation is attempted   Symptom Management:   Agitation/hollering: according to home med list patient was taking Depakote and Risperdal, consider restarting these medications.  Consider Depakote sprinkles 250 mg twice daily.  Discussed with attending Dr. Theodis Blaze  Palliative Prophylaxis:   Aspiration, Bowel Regimen, Delirium Protocol, Frequent Pain Assessment and Oral Care  Additional Recommendations (Limitations, Scope, Preferences):  Full Scope Treatment  Psycho-social/Spiritual:   Desire for further Chaplaincy support:yes  Additional Recommendations: Education on Hospice  Prognosis:   < 6 months-untreated diagnosed/February2018/ adenocarcinoma of the liver.  Continued physical and functional decline over the past several months  Discharge Planning: To Be Determined      Primary Diagnoses: Present on Admission: **None**   I have reviewed the medical record, interviewed the patient and family, and examined the patient. The following aspects are pertinent.  Past Medical History:  Diagnosis Date  . Anemia   . Bipolar 1 disorder ([place])   . Chronic diastolic CHF (congestive heart failure) ([place]) 10/10/2014   LVEDP elevated at time of cath  . CKD (chronic kidney disease), stage III ([place])   . Depression   . Gout   . Hypertension   . Normal coronary arteries 2015  . Schizoaffective disorder   . Seizure disorder ([place])   . Thrombocytopenia ([place])   . Vasovagal syncope    a. possible vasovagal syncope in setting of atypical CP 02/2016.   Social History   Socioeconomic History  . Marital status: Divorced    Spouse name: Not on file  . Number of children: Not on file  . Years of education: Not on file  . Highest education level: Not on file  Occupational History  . Occupation: Retired-retail Scientist, clinical (histocompatibility and immunogenetics): RETIRED    Comment: [name]    Social Needs  . Financial resource strain: Not on file  . Food insecurity:    Worry: Not on file    Inability: Not on file  . Transportation needs:    Medical: Not on file    Non-medical: Not on file  Tobacco Use  . Smoking status: Never Smoker  . Smokeless tobacco: Never Used  Substance and Sexual Activity  . Alcohol use: No  . Drug use: No  . Sexual activity: Not Currently  Lifestyle  . Physical activity:    Days per week: Not on file    Minutes per session: Not on file  . Stress: Not on file  Relationships  . Social connections:    Talks on phone: Not on file    Gets together: Not on file    Attends religious service: Not on file    Active member of club  or organization: Not on file    Attends meetings of clubs or organizations: Not on file    Relationship status: Not on file  Other Topics Concern  . Not on file  Social History Narrative   Health Care POA: Encarnacion Slates, daughter   Emergency Contact: Clydell Hakim [phone]   End of Life Plan: DNR   Live by her self.   Any pets: none   Diet: Patient has a varied diet of protein, starch, and vegetables.   Exercise: Patient walks and uses stationary bike.   Hobbies: Reading the Bible      Lives in [place], uses medicaid transportation to get to appointments.   Is a Company secretary at [employer].                  Family History  Problem Relation Age of Onset  . Heart attack Mother   . Heart disease Mother   . Heart attack Father   . Heart disease Father   . Hypertension Brother    Scheduled Meds: . aspirin EC  81 mg Oral Daily  . fentaNYL  12.5 mcg Transdermal Q72H  . heparin  5,000 Units Subcutaneous Q8H  . [START ON 12/03/2017] pantoprazole sodium  40 mg Per Tube Daily   Continuous Infusions: . sodium chloride    . levETIRAcetam 1,000 mg (12/02/17 1029)   PRN Meds:. Medications Prior to Admission:  Prior to Admission medications   Medication Sig Start Date End Date Taking? Authorizing Provider  Alum &  Mag Hydroxide-Simeth (COMFORT GEL PO) Take 15 mLs by mouth daily as needed (for stomach).    Yes [provider]  ammonium lactate (AMLACTIN) 12 % cream APPLY 1 GRAM TO THE AFFECTED AREA AS NEEDED FOR DRY SKIN 10/22/17  Yes Gardiner Barefoot, DPM  aspirin EC 81 MG tablet Take 1 tablet (81 mg total) by mouth daily. 04/23/17  Yes Everrett Coombe, MD  Calcium Citrate-Vitamin D (CALCIUM CITRATE + D3 PO) Take 1 tablet by mouth 3 (three) times daily.    Yes [provider]  diclofenac sodium (VOLTAREN) 1 % GEL Apply 2 g topically 2 (two) times daily as needed (pain).   Yes [provider]  diltiazem (CARDIZEM) 60 MG tablet Take 1 tablet (60 mg total) by mouth 3 (three) times daily. 10/06/17  Yes Turner, Eber Hong, MD  divalproex (DEPAKOTE ER) 250 MG 24 hr tablet TAKE 1 TABLET(250 MG) BY MOUTH DAILY Patient taking differently: Take 750 mg by mouth daily.  11/26/17  Yes Everrett Coombe, MD  Multiple Vitamins-Minerals (CENTRUM SILVER ADULT 50+ PO) Take 1 tablet by mouth daily.   Yes [provider]  nitroGLYCERIN (NITROSTAT) 0.4 MG SL tablet PLACE 1 TABLET UNDER THE THE TONGUE AS DIRECTED EVERY 5 MINUTES AS NEEDED FOR CHEST PAIN Patient taking differently: Place 0.4 mg under the tongue every 5 (five) minutes as needed for chest pain.  04/05/16  Yes Rumley, Hiltonia N, DO  pantoprazole (PROTONIX) 40 MG tablet Take 1 tablet (40 mg total) by mouth daily. 06/25/17  Yes Everrett Coombe, MD  polyethylene glycol powder (GLYCOLAX/MIRALAX) powder Take 17 g by mouth daily. 07/31/17  Yes Carlyle Dolly, MD  Polyvinyl Alcohol-Povidone (REFRESH OP) Place 1-2 drops into both eyes at bedtime as needed (dry eyes).    Yes [provider]  risperiDONE (RISPERDAL) 2 MG tablet TAKE 1 TABLET(2 MG) BY MOUTH AT BEDTIME Patient taking differently: Take 2 mg by mouth at bedtime.  11/26/17  Yes Everrett Coombe,  MD  sodium chloride (OCEAN) 0.65 % SOLN nasal spray Place 1 spray into both nostrils 2 (two) times  daily. 07/31/17  Yes Carlyle Dolly, MD  vitamin C (ASCORBIC ACID) 500 MG tablet Take 500 mg by mouth daily.   Yes [provider]  oxyCODONE (ROXICODONE) 5 MG immediate release tablet Take 0.5 tablets (2.5 mg total) by mouth every 6 (six) hours as needed for severe pain. 05/27/17   Joy, Shawn C, PA-C   Allergies  Allergen Reactions  . Ativan [Lorazepam] Other (See Comments)    Increases fall risk and was told not to take it.  . Benztropine Mesylate Other (See Comments)    Restless legs  . Clonazepam Other (See Comments)    Increases fall risk and was told not to take it.  . Vicodin [Hydrocodone-Acetaminophen] Other (See Comments)    Increases fall risk and was told not to take it.  . Lisinopril Other (See Comments)    cough  . Sulfonamide Derivatives Other (See Comments)    Pt does not remember an allergic reaction to sulfa drugs   Review of Systems  Unable to perform ROS: Mental status change    Physical Exam  Constitutional: She appears lethargic. She appears cachectic. She appears ill.  Cardiovascular: Tachycardia present.  Pulmonary/Chest: Effort normal and breath sounds normal.  Abdominal: There is tenderness in the right upper quadrant.  Neurological: She appears lethargic.  Skin: Skin is warm and dry. Bruising noted.    Vital Signs: BP (!) 170/78 (BP Location: Left Arm)   Pulse (!) 102   Temp 97.9 F (36.6 C) (Oral)   Resp 20   Wt 60.2 kg   SpO2 100%   BMI 23.51 kg/m  Pain Scale: Faces       SpO2: SpO2: 100 % O2 Device:SpO2: 100 % O2 Flow Rate: .   IO: Intake/output summary:   Intake/Output Summary (Last 24 hours) at 12/02/2017 1451 Last data filed at 12/02/2017 1342 Gross per 24 hour  Intake 845 ml  Output -  Net 845 ml    LBM:   Baseline Weight: Weight: 60.2 kg Most recent weight: Weight: 60.2 kg     Palliative Assessment/Data: 30%    Discussed with Dr Ouida Sills  Time In: 1100 Time Out: 1230 Time Total: 90 minutes Greater than  50%  of this time was spent counseling and coordinating care related to the above assessment and plan.  Signed by: Wadie Lessen, NP   Please contact Palliative Medicine Team phone at [phone] for questions and concerns.  For individual provider: See Shea Evans

## 2017-12-02 NOTE — Evaluation (Signed)
Physical Therapy Evaluation Patient Details Name: [name] MRN: [id number] DOB: January 24, 1941 Today's Date: 12/02/2017   History of Present Illness  77 y.o. female presenting with AMS and being found down, with right sided weakness. PMH is significant for HCC, bipolar 1 disorder, Schizoaffective Disorder, Hx Seizure, Chronic D CHF, HLD, HTN, CKD III, GERD. MRI revealed Question subtle gyriform diffusion abnormality involving the parasagittal left parietooccipital region and possibly left thalamus with Primary consideration would be subtle changes related to seizure. No large vessel occlusion.  Clinical Impression  Orders received for PT evaluation. Patient demonstrates deficits in functional mobility as indicated below. Will benefit from continued skilled PT to address deficits and maximize function. Will see as indicated and progress as tolerated.  At this time, patient requires significant physical assist 2 person max for most all aspects of mobility. Given current condition, feel patient will need SNF level of care.    Follow Up Recommendations SNF;Supervision/Assistance - 24 hour    Equipment Recommendations  (TBD)    Recommendations for Other Services       Precautions / Restrictions Precautions Precautions: Fall Precaution Comments: seizure precautions  Restrictions Weight Bearing Restrictions: No      Mobility  Bed Mobility Overal bed mobility: Needs Assistance Bed Mobility: Rolling;Supine to Sit;Sit to Supine Rolling: Max assist;+2 for physical assistance;+2 for safety/equipment   Supine to sit: Max assist;+2 for physical assistance;+2 for safety/equipment Sit to supine: Max assist;+2 for physical assistance;+2 for safety/equipment   General bed mobility comments: assist for LEs over EOB and to elevate trunk with multimodal cues utilized to have pt assist with advance LEs and reaching with UE; pt requires assist to maintain static sitting balance   Transfers                  General transfer comment: unsafe to attempt  Ambulation/Gait                Stairs            Wheelchair Mobility    Modified Rankin (Stroke Patients Only) Modified Rankin (Stroke Patients Only) Pre-Morbid Rankin Score: No symptoms Modified Rankin: Severe disability     Balance Overall balance assessment: Needs assistance Sitting-balance support: Single extremity supported;Feet supported Sitting balance-Leahy Scale: Poor Sitting balance - Comments: reliant on mod-maxA to maintain static sitting balance (EOB ~8 minutes) Postural control: Posterior lean;Left lateral lean                                   Pertinent Vitals/Pain Pain Assessment: Faces Faces Pain Scale: No hurt Pain Intervention(s): Monitored during session    Home Living Family/patient expects to be discharged to:: Private residence Living Arrangements: Alone   Type of Home: Apartment           Additional Comments: PLOF gleaned from chart review as pt not able to provide information and no family present; attempted to call phone number listed on facesheet but no answer at this time     Prior Function Level of Independence: Needs assistance               Hand Dominance        Extremity/Trunk Assessment   Upper Extremity Assessment Upper Extremity Assessment: Defer to OT evaluation;RUE deficits/detail RUE Deficits / Details: increased tone in RUE noted; pt demonstrates purposeful movements wtih LUE but no attempts to move RUE RUE Coordination: decreased gross motor;decreased fine motor  LUE Deficits / Details: generalized weakness  LUE Coordination: decreased gross motor    Lower Extremity Assessment Lower Extremity Assessment: RLE deficits/detail;LLE deficits/detail;Difficult to assess due to impaired cognition RLE Deficits / Details: noted significant tone in RLE, rigid presentation RLE Coordination: decreased fine motor;decreased gross motor LLE  Deficits / Details: active movement noted but limited strength generalized weakness LLE: Unable to fully assess due to pain    Cervical / Trunk Assessment Cervical / Trunk Assessment: Kyphotic  Communication   Communication: Expressive difficulties  Cognition Arousal/Alertness: Awake/alert Behavior During Therapy: Flat affect Overall Cognitive Status: No family/caregiver present to determine baseline cognitive functioning Area of Impairment: Following commands;Awareness                       Following Commands: Follows one step commands inconsistently;Follows one step commands with increased time   Awareness: Intellectual   General Comments: pt following one step commands approx 50% of the time; intermittently responding to questions, often with "yes"; pt does smile appropriately with therapists when attempting to engage pt in conversation; of note pt does tend to scream out upon initial waking suspect due to unfamiliar environment       General Comments      Exercises     Assessment/Plan    PT Assessment Patient needs continued PT services  PT Problem List Decreased strength;Decreased activity tolerance;Decreased balance;Decreased mobility;Decreased cognition;Decreased safety awareness;Impaired sensation;Impaired tone;Decreased coordination;Decreased range of motion       PT Treatment Interventions DME instruction;Gait training;Functional mobility training;Therapeutic activities;Therapeutic exercise;Balance training;Neuromuscular re-education;Cognitive remediation;Patient/family education;Wheelchair mobility training;Manual techniques    PT Goals (Current goals can be found in the Care Plan section)  Acute Rehab PT Goals Patient Stated Goal: none stated  PT Goal Formulation: Patient unable to participate in goal setting Time For Goal Achievement: 12/16/17 Potential to Achieve Goals: Fair    Frequency Min 3X/week   Barriers to discharge        Co-evaluation  PT/OT/SLP Co-Evaluation/Treatment: Yes Reason for Co-Treatment: For patient/therapist safety PT goals addressed during session: Mobility/safety with mobility OT goals addressed during session: ADL's and self-care       AM-PAC PT "6 Clicks" Daily Activity  Outcome Measure Difficulty turning over in bed (including adjusting bedclothes, sheets and blankets)?: Unable Difficulty moving from lying on back to sitting on the side of the bed? : Unable Difficulty sitting down on and standing up from a chair with arms (e.g., wheelchair, bedside commode, etc,.)?: Unable Help needed moving to and from a bed to chair (including a wheelchair)?: Total Help needed walking in hospital room?: Total Help needed climbing 3-5 steps with a railing? : Total 6 Click Score: 6    End of Session   Activity Tolerance: Patient limited by fatigue Patient left: in bed;with call bell/phone within reach;with bed alarm set Nurse Communication: Mobility status PT Visit Diagnosis: Hemiplegia and hemiparesis;Other symptoms and signs involving the nervous system (R29.898) Hemiplegia - Right/Left: Left Hemiplegia - caused by: Cerebral infarction    Time: 4315-4008 PT Time Calculation (min) (ACUTE ONLY): 17 min   Charges:   PT Evaluation $PT Eval Moderate Complexity: 1 Mod          Alben Deeds, PT DPT  Board Certified Neurologic Specialist [place] 12/02/2017, 10:33 AM

## 2017-12-02 NOTE — ED Notes (Signed)
Patient returned from MRI.

## 2017-12-02 NOTE — Evaluation (Signed)
Clinical/Bedside Swallow Evaluation Patient Details  Name: MARILYNN EKSTEIN MRN: [id number] Date of Birth: January 15, 1941  Today's Date: 12/02/2017 Time: SLP Start Time (ACUTE ONLY): 3875 SLP Stop Time (ACUTE ONLY): 0850 SLP Time Calculation (min) (ACUTE ONLY): 15 min  Past Medical History:  Past Medical History:  Diagnosis Date  . Anemia   . Bipolar 1 disorder ([place])   . Chronic diastolic CHF (congestive heart failure) ([place]) 10/10/2014   LVEDP elevated at time of cath  . CKD (chronic kidney disease), stage III ([place])   . Depression   . Gout   . Hypertension   . Normal coronary arteries 2015  . Schizoaffective disorder   . Seizure disorder ([place])   . Thrombocytopenia ([place])   . Vasovagal syncope    a. possible vasovagal syncope in setting of atypical CP 02/2016.   Past Surgical History:  Past Surgical History:  Procedure Laterality Date  . APPENDECTOMY    . CARDIAC CATHETERIZATION  2005   Normal  . CARDIAC CATHETERIZATION  11/09/2013   Normal  . LEFT HEART CATHETERIZATION WITH CORONARY ANGIOGRAM N/A 11/09/2013   Procedure: LEFT HEART CATHETERIZATION WITH CORONARY ANGIOGRAM;  Surgeon: Leonie Man, MD;  Location: [facility] CATH LAB;  Service: Cardiovascular;  Laterality: N/A;  . TUBAL LIGATION     HPI:  77 y.o. Female admitted from home with acute MS changes and L sided weakness.  Found down by family. - MRI consistent with seizure per most recent neuro notes    Assessment / Plan / Recommendation Clinical Impression   Pt presents with a primarily cognitively based dysphagia.  Cognitive-linguistic evaluation was completed, please see report to follow.  Pt has slowed but effective oral phase for consumption of thin liquids and purees.  Some spillage of liquids was noted from the oral cavity due to initially decreased oral coordination; however, automaticity of oral phase and swallow response seemed to improve with increased trials.  Solids were not attempted on this date due to pt's decreased  endurance and progressive lethargy.  As pt became more fatigued, she had one immediate, strong reflexive cough on thin liquids but otherwise had no other overt s/s of aspiration, even when allowed to take multiple, consecutive sips via straw.   As a result, recommend a diet of pureed solids and thin liquids with full supervision for use of swallowing precautions; please cease meals if pt begins to cough or becomes lethargic.  Pt currently needs total assist for self feeding; however, SLP is hopeful that as pt's mentation improves that her diet will be able to be advanced and that she will be able to feed herself.  No family present to verify baseline level of function but it sounds like pt may have lived alone so I suspect she was able to feed herself prior to admission.   SLP Visit Diagnosis: Dysphagia, unspecified (R13.10)    Aspiration Risk  Mild aspiration risk;Moderate aspiration risk    Diet Recommendation Dysphagia 1 (Puree);Thin liquid   Liquid Administration via: Cup;Straw Medication Administration: Crushed with puree Supervision: Full supervision/cueing for compensatory strategies;Staff to assist with self feeding Compensations: Minimize environmental distractions;Slow rate;Small sips/bites Postural Changes: Seated upright at 90 degrees;Remain upright for at least 30 minutes after po intake    Other  Recommendations Oral Care Recommendations: Oral care BID   Follow up Recommendations Other (comment)(To be determined pending progress made while inpatient)      Frequency and Duration min 2x/week          Prognosis Prognosis  for Safe Diet Advancement: Good      Swallow Study   General HPI: Acute MS changes and L sided weakness - MRI consistent with seizure per most recent neuro notes  Type of Study: Bedside Swallow Evaluation Previous Swallow Assessment: none on record Diet Prior to this Study: NPO Temperature Spikes Noted: No Respiratory Status: Room air History of Recent  Intubation: No Behavior/Cognition: Alert;Confused;Requires cueing Oral Cavity Assessment: Dry Oral Care Completed by SLP: Yes Oral Cavity - Dentition: Dentures, top;Dentures, bottom Self-Feeding Abilities: Total assist Patient Positioning: Upright in bed Baseline Vocal Quality: Normal Volitional Cough: Cognitively unable to elicit(reflexive cough is strong) Volitional Swallow: Unable to elicit    Oral/Motor/Sensory Function Overall Oral Motor/Sensory Function: Other (comment)(difficult to assess )   Ice Chips     Thin Liquid Thin Liquid: Impaired Presentation: Cup;Straw Oral Phase Impairments: Reduced labial seal;Poor awareness of bolus Oral Phase Functional Implications: Right anterior spillage;Left anterior spillage Pharyngeal  Phase Impairments: Cough - Immediate    Nectar Thick     Honey Thick     Puree Puree: Impaired Oral Phase Impairments: Reduced lingual movement/coordination Oral Phase Functional Implications: Prolonged oral transit   Solid            Avigdor Dollar, Elmyra Ricks L 12/02/2017,9:11 AM

## 2017-12-02 NOTE — ED Notes (Signed)
Patient transported to MRI 

## 2017-12-03 ENCOUNTER — Inpatient Hospital Stay (HOSPITAL_COMMUNITY): Payer: Medicare Other

## 2017-12-03 DIAGNOSIS — W19XXXA Unspecified fall, initial encounter: Secondary | ICD-10-CM

## 2017-12-03 DIAGNOSIS — Z515 Encounter for palliative care: Secondary | ICD-10-CM

## 2017-12-03 DIAGNOSIS — E860 Dehydration: Secondary | ICD-10-CM

## 2017-12-03 DIAGNOSIS — E46 Unspecified protein-calorie malnutrition: Secondary | ICD-10-CM

## 2017-12-03 DIAGNOSIS — C220 Liver cell carcinoma: Secondary | ICD-10-CM

## 2017-12-03 DIAGNOSIS — Z7189 Other specified counseling: Secondary | ICD-10-CM

## 2017-12-03 DIAGNOSIS — G40909 Epilepsy, unspecified, not intractable, without status epilepticus: Secondary | ICD-10-CM

## 2017-12-03 LAB — CBC
HCT: 34.6 % — ABNORMAL LOW (ref 36.0–46.0)
HEMOGLOBIN: 11.2 g/dL — AB (ref 12.0–15.0)
MCH: 25.6 pg — ABNORMAL LOW (ref 26.0–34.0)
MCHC: 32.4 g/dL (ref 30.0–36.0)
MCV: 79.2 fL (ref 78.0–100.0)
Platelets: 283 10*3/uL (ref 150–400)
RBC: 4.37 MIL/uL (ref 3.87–5.11)
RDW: 17.7 % — AB (ref 11.5–15.5)
WBC: 8 10*3/uL (ref 4.0–10.5)

## 2017-12-03 LAB — BASIC METABOLIC PANEL
ANION GAP: 9 (ref 5–15)
BUN: 22 mg/dL (ref 8–23)
CALCIUM: 9.4 mg/dL (ref 8.9–10.3)
CHLORIDE: 111 mmol/L (ref 98–111)
CO2: 24 mmol/L (ref 22–32)
Creatinine, Ser: 1.08 mg/dL — ABNORMAL HIGH (ref 0.44–1.00)
GFR calc non Af Amer: 48 mL/min — ABNORMAL LOW (ref >60)
GFR, EST AFRICAN AMERICAN: 56 mL/min — AB (ref >60)
Glucose, Bld: 136 mg/dL — ABNORMAL HIGH (ref 70–99)
POTASSIUM: 3.7 mmol/L (ref 3.5–5.1)
Sodium: 144 mmol/L (ref 135–145)

## 2017-12-03 LAB — ABO/RH: ABO/RH(D): A POS

## 2017-12-03 LAB — TYPE AND SCREEN
ABO/RH(D): A POS
ANTIBODY SCREEN: NEGATIVE

## 2017-12-03 MED ORDER — LEVETIRACETAM 100 MG/ML PO SOLN
1000.0000 mg | Freq: Two times a day (BID) | ORAL | Status: DC
  Administered 2017-12-03 – 2017-12-04 (×2): 1000 mg via ORAL
  Filled 2017-12-03 (×4): qty 10

## 2017-12-03 NOTE — Progress Notes (Signed)
Pt off unit to xray. Delia Heady RN

## 2017-12-03 NOTE — Progress Notes (Signed)
Pt back to room from x-ray. Delia Heady RN

## 2017-12-03 NOTE — Progress Notes (Addendum)
CALL PAGER [phone] for any questions or notifications regarding this patient   FMTS Attending Daily Note: [name], MD  Pager [phone]  Office [phone] I have seen and examined this patient, reviewed their chart. I have discussed this patient with the resident. I agree with the resident's findings, assessment and care plan. See my edits below. Will decrease fluids as patients PO intake is improving.    Family Medicine Teaching Service Daily Progress Note Intern Pager: [phone]  Patient name: [name] Medical record number: [id number] Date of birth: 02-Apr-1941 Age: 77 y.o. Gender: female  Primary Care Provider: Everrett Coombe, MD  Code Status: full but needs to be clarified Consultations: neurology, PT/OT, speech Admission date: 8/19  Pt Overview and Major Events to Date:  8/19 admitted for AMS, found down up to 24 hours  Subjective: Overnight: no acute events Today:  Patient mental status was improved. She was not yelling. She was able to answer a couple questions appropriately.  Palliative care spoke to patient's brother and daughter Barnett Applebaum in [place] who will try to come up to see mother in hospital and has meeting with palliative Monday at 1 if not sooner. See palliative note for more detail on that discussion. Patient reported to have similar episode in 2014 per daughter.   Objective: Temp:  [97.4 F (36.3 C)-98.6 F (37 C)] 98 F (36.7 C) (08/21 0738) Pulse Rate:  [72-102] 72 (08/21 0738) Resp:  [16-20] 18 (08/21 0738) BP: (155-170)/(72-87) 162/72 (08/21 0738) SpO2:  [98 %-100 %] 98 % (08/21 0738)  Physical Exam: General: exaggerated blinking has resolved. Patient maintains good eye contact during conversation. Answers occasional questions appropriately Cardiovascular: regular heart rate with regular rhythm and a mild systolic murmer and possible S3 heard on exam Respiratory: no adventitious sounds were appreciated patient followed commands for taking breaths today  but did not take real deep breaths Abdomen: soft, non-distended, no masses. Palpation did not seem to illicit a pain response from patient. There was no skin changes here Neuro: patient was not oriented to person, place, time Extremities: patient had multiple areas on extremities that appeared to be non blanchable, non-raised ecchymoses unchanged from yesterday.   Assessment and Plan: AVELYN TOUCH is a 77 y.o. female presenting with AMS and being found down with last known normal on Sunday 2:30pm likely due to seizure at home. PMH is significant for HCC, Schizoaffective Disorder, Hx Seizure, Chronic D CHF, HLD, HTN, CKD III, GERD.   AMS improved today.At baseline, oriented to person and place, lives alone and functions well. She was not oriented to self, place, or time at time of my exam.Most likely related to seizure at home. Infectious evaluation pending. Will consult Psychiatry.  - continue on telemetry - vitals per floor protocol q4h  - follow neuro recs  -transition Keppra to PO formulation   - follow up with neurology  - neurology signed off today -speech therapy recs  -dysphagia 1 (puree); thin liquid diet -urine culture from in and out cath -consider adding on BP medication since BP remains elevated - PT/OT consulted - cont home ASA 81mg  daily - Psychiatry consulted    Fall at Home, unwitnessed. Patient found lying on her back after potentially 24hrs down. CXR negative for acute bony abnormalities, Right Tib/Fib X-ray neagtive for acute bony abnormalities, pelvis XR negative for acute abnormality. humerus and elbow xray negative. Was in ED on 8/17 for weakness and requested Nursing Home placement. Hx Vasovagal syncope from Cards note in January 2019.  Lives alone with home health and family to check on her. -ordered fall precautions - patient has sitter  Known Hepatocellular Carcinoma: Confirmed by Biopsy 2018 Elevated LFTs, AST 115, ALT138.  Patient has refused treatment in  the past. Admitting resident is her PCP. She has strongly indicated in multiple conversations in 05/2017 and 11/2017 that she would not want referral to oncology, she would not want any invasive procedures for further workup or treatment of her cancer. She feels that her cancer is "in God's hands." She has been considering palliative consult as an outpatient but did not feel ready for this yet at her last office visit. - Palliative consulted and spoke to family about this - avoid hepatotoxic agents -monitor CMPs  Schizoaffective disorder patient's daughter states that she had a similar admission in 2014 that required inpatient psychiatry management called it a "break" On Depakote and Risperdal at home. Depakote Level <10 -consulted Psychiatry who will see patient tomorrow  Goals of care Patient has [place] that she has expressed to PCP she would not like treated and would like to leave in "God's hands."  Brother is unsure of patient's wishes regarding code status but states that he believes "she would want it all done."  Daughter spoke to palliative and expressed that she is in agreement with mother's desire to not seek treatment. Patient was also recently in the ED requesting Nursing Home placement due to weakness. - Palliative care consulted and has meeting with daughter on Monday 1pm - Social work consulted -monitor when patient is more alert and oriented - Will address code status   Hx Seziures 01/12/2013 notes states seizure activity witnessed.  Brother unaware of any history of seizures.  On depakote at home - follow Neuro recs, Keppra as above    HLD No home statin.  Has chronically elevated LFTs 2/2 HCC.  On ASA 81mg  at home. -cont home ASA 81mg  - f/u Neuro recs for treatment  HTN On Diltiazem at home. Last BP 134/69 - Hold home diltiazem -Permissive HTN x24hrs per Rule Out Stroke  CKD III Cr 1.22 on admission, appears at baseline. - cont to monitor BMP - IVF per found  down  GERD Protonix at home. - start protonix today in oral suspension  FEN/GI: dysphagia 1, 159mL/hr NS until CK trends down and good oral intake Prophylaxis: Heparin  Disposition: admit to telemetry  Laboratory: Recent Labs  Lab 11/29/17 0321 12/09/2017 1700 11/26/2017 1708 11/26/2017 1902 12/02/17 1503  WBC 7.6 10.0  --   --  8.7  HGB 10.7* 12.7 14.3 12.9  12.9 11.5*  HCT 32.4* 39.6 42.0 38.0  38.0 35.6*  PLT 268 381  --   --  315   Recent Labs  Lab 11/29/17 0321  11/24/2017 1800 11/18/2017 1902 12/13/2017 2200  NA 137   < > 138 132*  132* 138  K 4.0   < > 4.4 8.1*  8.1* 4.3  CL 100   < > 97* 97*  97* 102  CO2 26  --  26  --  23  BUN 15   < > 20 33*  33* 21  CREATININE 1.05*   < > 1.22* 1.10*  1.10* 1.12*  CALCIUM 10.4*  --  10.8*  --  9.8  PROT  --   --  7.5  --   --   BILITOT  --   --  1.1  --   --   ALKPHOS  --   --  217*  --   --  ALT  --   --  138*  --   --   AST  --   --  115*  --   --   GLUCOSE 119*   < > 120* 117*  117* 103*   < > = values in this interval not displayed.    Imaging/Diagnostic Tests: CTA head, neck, MR head Details in plan Xray of right humerus and elbow negative for fracture  Richarda Osmond, DO 12/03/2017, 7:41 AM PGY-1, [place] Intern pager: [phone], text pages welcome

## 2017-12-03 NOTE — Progress Notes (Addendum)
Subjective: Awake but not oriented. Densely aphasic. No further seizures.   Exam: Vitals:   12/03/17 0317 12/03/17 0738  BP: (!) 168/73 (!) 162/72  Pulse: 80 72  Resp: 18 18  Temp: (!) 97.4 F (36.3 C) 98 F (36.7 C)  SpO2: 100% 98%    Physical Exam   HEENT-  Normocephalic, no lesions, without obvious abnormality.  Normal external eye and conjunctiva.   Extremities- Warm, dry and intact-Left arm and right wrist in splint Musculoskeletal-no joint tenderness, deformity or swelling Skin-warm and dry, no hyperpigmentation, vitiligo, or suspicious lesions  Neuro: --no change in exam Mental Status: Patient is awake, alert, [name] is densely aphasic and perseverating on "yes" Cranial Nerves: II: Does not blink to threat from the right. Pupils are equal, round, and reactive to light.   III,IV, VI: Left gaze preference but does cross midline to the right V: Facial sensation is symmetric to temperature VII: Facial movement with left facial weakness VIII: hearing is intact to voice X: Uvula elevates symmetrically XI: Shoulder shrug is symmetric. XII: tongue is midline without atrophy or fasciculations.  Motor: Tone is normal. Bulk is normal.  [name] has weakness of the right arm and leg, but does withdrawal to noxious stimuli, normal movements of the left Sensory: [name] responds less to noxious stimuli on the right than the left     Medications:  Scheduled: . aspirin EC  81 mg Oral Daily  . heparin  5,000 Units Subcutaneous Q8H  . pantoprazole sodium  40 mg Per Tube Daily   Continuous: . sodium chloride 125 mL/hr at 12/03/17 0427  . acetaminophen    . levETIRAcetam 1,000 mg (12/03/17 0926)    Pertinent Labs/Diagnostics: EEG--The EEG isnormal with patient recorded in awake and drowsy state   [facility] Contrast Result Date: 12/02/2017 CLINICAL DATA:  Initial evaluation for acute altered mental status, found down, right-sided weakness. EXAM: MRI HEAD WITHOUT CONTRAST MRA  HEAD WITHOUT CONTRAST TECHNIQUE:. IMPRESSION: MRI HEAD IMPRESSION: 1. Question subtle gyriform diffusion abnormality involving the parasagittal left parietooccipital region and possibly left thalamus. Primary consideration would be subtle changes related to seizure. Correlation with history and EEG suggested. Sequelae of mild ischemia would be the primary differential consideration. 2. Moderate atrophy with mild chronic small vessel ischemic disease. MRA HEAD IMPRESSION: Stable appearance of the intracranial circulation. No large vessel occlusion. No hemodynamically significant or correctable stenosis. Electronically Signed   By: Jeannine Boga M.D.   On: 12/02/2017 02:10    Etta Quill PA-C [facility] [phone]   Assessment: Breakthrough seizure  1. 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 EEG is normal.   Recommendations: 1. Continue Keppra at current dose and follow up with neurology as outpatient.  2. Neurology will sign off. Please call if there are additional questions.   Electronically signed: Dr. Kerney Elbe 12/03/2017, 9:31 AM

## 2017-12-04 ENCOUNTER — Inpatient Hospital Stay (HOSPITAL_COMMUNITY): Payer: Medicare Other

## 2017-12-04 DIAGNOSIS — R531 Weakness: Secondary | ICD-10-CM

## 2017-12-04 LAB — BASIC METABOLIC PANEL
Anion gap: 8 (ref 5–15)
BUN: 26 mg/dL — AB (ref 8–23)
CO2: 24 mmol/L (ref 22–32)
CREATININE: 1.1 mg/dL — AB (ref 0.44–1.00)
Calcium: 9.5 mg/dL (ref 8.9–10.3)
Chloride: 112 mmol/L — ABNORMAL HIGH (ref 98–111)
GFR calc Af Amer: 54 mL/min — ABNORMAL LOW (ref >60)
GFR, EST NON AFRICAN AMERICAN: 47 mL/min — AB (ref >60)
Glucose, Bld: 106 mg/dL — ABNORMAL HIGH (ref 70–99)
Potassium: 4.1 mmol/L (ref 3.5–5.1)
SODIUM: 144 mmol/L (ref 135–145)

## 2017-12-04 LAB — BLOOD GAS, ARTERIAL
ACID-BASE EXCESS: 2.1 mmol/L — AB (ref 0.0–2.0)
Bicarbonate: 25.6 mmol/L (ref 20.0–28.0)
DRAWN BY: 511911
FIO2: 21
O2 SAT: 95.1 %
PATIENT TEMPERATURE: 98.6
PO2 ART: 76.2 mmHg — AB (ref 83.0–108.0)
pCO2 arterial: 36.6 mmHg (ref 32.0–48.0)
pH, Arterial: 7.459 — ABNORMAL HIGH (ref 7.350–7.450)

## 2017-12-04 LAB — URINE CULTURE: Culture: NO GROWTH

## 2017-12-04 LAB — CBC
HCT: 43.2 % (ref 36.0–46.0)
Hemoglobin: 14 g/dL (ref 12.0–15.0)
MCH: 25.7 pg — ABNORMAL LOW (ref 26.0–34.0)
MCHC: 32.4 g/dL (ref 30.0–36.0)
MCV: 79.4 fL (ref 78.0–100.0)
PLATELETS: 240 10*3/uL (ref 150–400)
RBC: 5.44 MIL/uL — ABNORMAL HIGH (ref 3.87–5.11)
RDW: 18.7 % — AB (ref 11.5–15.5)
WBC: 9.3 10*3/uL (ref 4.0–10.5)

## 2017-12-04 MED ORDER — SODIUM CHLORIDE 0.9 % IV SOLN
100.0000 mg | Freq: Two times a day (BID) | INTRAVENOUS | Status: DC
  Administered 2017-12-04: 100 mg via INTRAVENOUS
  Filled 2017-12-04 (×2): qty 10

## 2017-12-04 MED ORDER — LORAZEPAM 2 MG/ML IJ SOLN
2.0000 mg | Freq: Once | INTRAMUSCULAR | Status: AC
  Administered 2017-12-04: 2 mg via INTRAVENOUS
  Filled 2017-12-04: qty 1

## 2017-12-04 MED ORDER — LORAZEPAM 2 MG/ML IJ SOLN
INTRAMUSCULAR | Status: AC
  Administered 2017-12-04: 2 mg
  Filled 2017-12-04: qty 1

## 2017-12-04 MED ORDER — VALPROATE SODIUM 500 MG/5ML IV SOLN
1200.0000 mg | Freq: Once | INTRAVENOUS | Status: AC
  Administered 2017-12-04: 1200 mg via INTRAVENOUS
  Filled 2017-12-04 (×2): qty 12

## 2017-12-04 MED ORDER — SODIUM CHLORIDE 0.9 % IV SOLN
200.0000 mg | Freq: Two times a day (BID) | INTRAVENOUS | Status: DC
  Administered 2017-12-04 – 2017-12-21 (×34): 200 mg via INTRAVENOUS
  Filled 2017-12-04 (×39): qty 20

## 2017-12-04 MED ORDER — VALPROATE SODIUM 500 MG/5ML IV SOLN
1000.0000 mg | INTRAVENOUS | Status: AC
  Administered 2017-12-04: 1000 mg via INTRAVENOUS
  Filled 2017-12-04: qty 10

## 2017-12-04 MED ORDER — DILTIAZEM HCL 30 MG PO TABS
60.0000 mg | ORAL_TABLET | Freq: Three times a day (TID) | ORAL | Status: DC
  Administered 2017-12-04 – 2017-12-09 (×4): 60 mg via ORAL
  Filled 2017-12-04 (×9): qty 2

## 2017-12-04 NOTE — Progress Notes (Addendum)
Family Medicine Teaching Service Daily Progress Note Intern Pager: [phone]  Patient name: [name] Medical record number: [id number] Date of birth: March 30, 1941 Age: 77 y.o. Gender: female  Primary Care Provider: Everrett Coombe, [name]  Code Status: full but needs to be clarified Consultations: neurology, PT/OT, speech, palliative Admission date: 8/19  Pt Overview and Major Events to Date:  8/19 admitted for AMS, found down up to 24 hours  Subjective: Overnight: no acute events, restarted home diltiazem for elevated BP Today: Blood pressures better controlled today 148/70. Other vitals stable. Patient was more alert and said good morning when I came in today. She was able to answer some questions appropriately with single word answers, but when she tried to speak longer her speech was uncomprehensible. Patient did endorse being hungry and tried to eat but was unable to put food on her utensils herself. I did feed her some eggs and she was able to eat without problem. Patient did state that she has pain but was unable to state where  Objective: Blood pressure (!) 148/70, pulse 75, temperature 98.5 F (36.9 C), temperature source Oral, resp. rate 15, weight 60.2 kg, SpO2 97 %.  Physical Exam: General: Patient maintains good eye contact during conversation. Answers occasional questions appropriately Cardiovascular: regular heart rate with regular rhythm and a mild systolic murmer, S3 heard on exam Respiratory: no adventitious sounds were appreciated patient followed commands for taking breaths today but did not take real deep breaths Abdomen: soft, non-distended, no masses. Palpation did not seem to illicit a pain response from patient. There was no skin changes here Neuro: patient was not oriented to person, place, time. Patient able to hold spoon and move all extremities. She had an intentional hand tremor Extremities: patient had multiple areas on extremities that appeared to be non  blanchable, non-raised ecchymoses that appear to have improved from yesterday- they are warm to the touch  Assessment and Plan: [name] is a 77 y.o. female presenting with AMS and being found down with last known normal on Sunday 2:30pm likely due to seizure at home. PMH is significant for HCC, Schizoaffective Disorder, Hx Seizure, Chronic D CHF, HLD, HTN, CKD III, GERD.   AMS improved today.At baseline, oriented to person and place, lives alone and functions well. She was not oriented to self, place, or time at time of my exam. Most likely related to seizure at home. Chest xray shows no active cardiopulmonary disease. Urine culture from in and out cath pending.Psychiatry consulted and plans to see her today. Palliative is following and spoke to family yesterday and has meeting set up to talk to them Monday at 1 or sooner if they can come up from [place] sooner. - continue on telemetry - vitals per floor protocol q4h  - follow neuro recs  -continue Keppra -speech therapy recs  -dysphagia 1 (puree); thin liquid diet - PT/OT consulted - cont home ASA 81mg  daily   Fall at Home, unwitnessed. Patient found lying on her back after potentially 24hrs down. CXR negative for acute bony abnormalities, Right Tib/Fib X-ray neagtive for acute bony abnormalities, pelvis XR negative for acute abnormality. humerus and elbow xray negative. Was in ED on 8/17 for weakness and requested Nursing Home placement. Hx Vasovagal syncope from Cards note in January 2019. Lives alone with home health and family to check on her. -ordered fall precautions  Known Hepatocellular Carcinoma: Confirmed by Biopsy 2018 Elevated LFTs, AST 115, ALT138.  Patient has refused treatment in the past. Admitting  resident is her PCP. She has strongly indicated in multiple conversations in 05/2017 and 11/2017 that she would not want referral to oncology, she would not want any invasive procedures for further workup or treatment of her  cancer. She feels that her cancer is "in God's hands." She has been considering palliative consult as an outpatient but did not feel ready for this yet at her last office visit. - Palliative consulted and spoke to family about this - avoid hepatotoxic agents -monitor CMPs  Schizoaffective disorder patient's daughter states that she had a similar admission in 2014 that required inpatient psychiatry management called it a "break". Unsure why patient's psych meds were held at admission. Patient has previously told PCP that she is very addiment about continuing to take these meds because they work well for her. On Depakote and Risperdal at home. Depakote Level <10 -consulted Psychiatry who will see patient today  Goals of care Patient has [place] that she has expressed to PCP she would not like treated and would like to leave in "God's hands."  Brother is unsure of patient's wishes regarding code status but states that he believes "she would want it all done."  Daughter spoke to palliative and expressed that she is in agreement with mother's desire to not seek treatment. Patient was also recently in the ED requesting Nursing Home placement due to weakness. - Palliative care consulted and has meeting with daughter on Monday 1pm - Social work consulted -monitor when patient is more alert and oriented - Will address code status   Hx Seziures 01/12/2013 notes states seizure activity witnessed.  Brother unaware of any history of seizures.  On depakote at home - follow Neuro recs, Keppra as above   HLD No home statin.  Has chronically elevated LFTs 2/2 HCC.  On ASA 81mg  at home. -cont home ASA 81mg  - f/u Neuro recs for treatment  HTN On Diltiazem at home. Last BP 148/70 - restarted diltiazem 60mg  q8 hours  CKD III Cr 1.22 on admission, appears at baseline. - cont to monitor BMP  GERD Protonix at home. - started protonix yesterday in oral suspension  FEN/GI: dysphagia 1 Prophylaxis:  Heparin  Disposition: telemetry  Laboratory: Recent Labs  Lab 12/02/17 1503 12/03/17 0841 12/04/17 0406  WBC 8.7 8.0 9.3  HGB 11.5* 11.2* 14.0  HCT 35.6* 34.6* 43.2  PLT 315 283 240   Recent Labs  Lab 12/06/2017 1800  12/03/2017 2200 12/03/17 0841 12/04/17 0406  NA 138   < > 138 144 144  K 4.4   < > 4.3 3.7 4.1  CL 97*   < > 102 111 112*  CO2 26  --  23 24 24   BUN 20   < > 21 22 26*  CREATININE 1.22*   < > 1.12* 1.08* 1.10*  CALCIUM 10.8*  --  9.8 9.4 9.5  PROT 7.5  --   --   --   --   BILITOT 1.1  --   --   --   --   ALKPHOS 217*  --   --   --   --   ALT 138*  --   --   --   --   AST 115*  --   --   --   --   GLUCOSE 120*   < > 103* 136* 106*   < > = values in this interval not displayed.    Imaging/Diagnostic Tests: CTA head, neck, MR head Details in plan Xray  of right humerus and elbow negative for fracture  [name] Osmond, DO 12/04/2017, 7:02 AM PGY-1, [place] Intern pager: [phone], text pages welcome  -------------------------------------------------------------------  FMTS Attending Daily Note:  [name]  [phone] pager  Family Practice pager:  [phone] I have seen and examined this patient and have reviewed their chart. I have discussed this patient with the resident. I agree with the resident's findings, assessment and care plan.  Additionally:  Not oriented on my exam.  Speaking in slurred, difficult to understand monosyllables.  Change from baseline according to PCP persists.   - Not likely ongoing seizures.  Wonder role of psychiatric dx/lack of medications playing a role  - Dispo pending restarting psych based based on psych consultation  - also awaiting palliative care discussion with family, although may not be inpatient for this as cannot get into town until Monday.   [name] Reasons, [name] 12/04/2017 4:26 PM

## 2017-12-04 NOTE — Progress Notes (Signed)
LTM EEG reviewed. Left sided PLEDs are present without evolution to electrographic seizure.   Continue with Keppra and Vimpat as per assessment and plan from earlier today. If seizures recur, will need to reload with an alternate anticonvulsant such as Dilantin and monitor LFTs at regular intervals. Valproic acid has been effective, but was associated with elevated LFTs.   Electronically signed: Dr. Kerney Elbe

## 2017-12-04 NOTE — Care Management Important Message (Signed)
Important Message  Patient Details  Name: [name] MRN: [id number] Date of Birth: Jan 06, 1941   Medicare Important Message Given:  Yes    [name] [address] Circle 12/04/2017, 1:22 PM

## 2017-12-04 NOTE — Progress Notes (Signed)
FPTS Interim Progress Note  Patient seen and examined earlier this evening. She remains very difficult to wake most likely due to post-ictal state and 4 mg ativan earlier today. Vitals and ABG reassuring. Her family was updated at bedside. Will continue to follow closely.  Everrett Coombe, MD 12/04/2017, 10:32 PM PGY-3, [place] Medicine Service pager [phone]

## 2017-12-04 NOTE — Progress Notes (Signed)
PT Cancellation Note  Patient Details Name: [name] MRN: [id number] DOB: 1940/09/09   Cancelled Treatment:    Reason Eval/Treat Not Completed: Medical issues which prohibited therapy. Discussed with RN, Lannette Donath who stated pt not medically appropriate for therapy today. Recently received Ativan. Will follow.  Ellamae Sia, PT, DPT Acute Rehabilitation Services  Pager: [place] 12/04/2017, 1:05 PM

## 2017-12-04 NOTE — Consult Note (Signed)
Attempted to see patient but unable to arouse from sleep by verbal or tactile stimulation. Will attempt again tomorrow.   Buford Dresser, DO 12/04/17 3:57 PM

## 2017-12-04 NOTE — Progress Notes (Signed)
Dr. Cheral Marker went to pt's room and came out to inform RN pt having seizures and RN to administer 2mg  ativan; ativan was administered; ordered IV medication administered; MD verbally ordered for RN to administer 2mg  ativan and order was clarified with MD who stated pt was twitching so he wants her to get another dose of 2mg  ativan. Ativan given and pt sleeping comfortably in bed with call light within reach. Delia Heady RN

## 2017-12-04 NOTE — Progress Notes (Signed)
Patient ID: [name], female   DOB: 1940-10-30, 77 y.o.   MRN: [id number]  Saw Dr. Tamala Fothergill note this afternoon with concerns for arousal, went to evaluate the patient. Per Neurology, received 2mg  Ativan at 1143 and another 2 mg at 1233 for continued seizure activity.   Physical Exam:  General: Frail appearing, laying in bed, NAD   Cardiac: RRR no m/g/r noted Pulm: Clear anteriorly, breathing rate appropriate   Neuro: Unable to arouse with tactile and verbal stimuli. No withdrawal from ungual pressure. Pupils equal and reactive to light with appropriate corneal reflex bilaterally. 2/4 patellar and bicep reflexes bilaterally.   Exam consistent with recent 4mg  ativan administration in a frail 77 year female. Will continue to monitor.   [name] Hillock, DO  PGY-1 Family Medicine

## 2017-12-04 NOTE — Social Work (Signed)
HIPPA compliant message left on the voicemail of pt daughter [name]- [phone]. Await return call.   Alexander Mt, [place] Work [phone]

## 2017-12-04 NOTE — Progress Notes (Signed)
vLTM EEG running. No skin breakdown/ tested event button/ notified Neuro

## 2017-12-04 NOTE — Progress Notes (Signed)
Pt slept through remainder of shift since the dose of ativan received; pt NSR on telemetry and very drowsy and sleeping but respond to pain. Family at bedside and reported off to oncoming RN. Delia Heady RN

## 2017-12-04 NOTE — Progress Notes (Addendum)
Subjective: Actively twitching on right side.   Objective: Current vital signs: BP (!) 129/53 (BP Location: Right Arm)   Pulse 76   Temp 98.6 F (37 C) (Oral)   Resp 20   Wt 60.2 kg   SpO2 100%   BMI 23.51 kg/m  Vital signs in last 24 hours: Temp:  [98 F (36.7 C)-98.6 F (37 C)] 98.6 F (37 C) (08/22 1127) Pulse Rate:  [75-84] 76 (08/22 1127) Resp:  [15-20] 20 (08/22 1127) BP: (129-182)/(53-92) 129/53 (08/22 1127) SpO2:  [97 %-100 %] 100 % (08/22 1127)  Intake/Output from previous day: 08/21 0701 - 08/22 0700 In: 1560.8 [P.O.:480; I.V.:980.8; IV Piggyback:100] Out: 1050 [Urine:1050] Intake/Output this shift: Total I/O In: 360 [P.O.:360] Out: -  Nutritional status:  Diet Order            DIET - DYS 1 Room service appropriate? Yes; Fluid consistency: Thin  Diet effective now              HEENT: [place]/AT Lungs: Respirations unlabored  Mental Status: Patient is asleep when neurologist enters the room. Awakens easily to an alert state. She is densely aphasic and perseverating on a single phrase unrelated to questions asked. She will gaze at examiner with a somewhat blank expression.  Cranial Nerves: Right side of face with intermittent twitching. Right side of neck with intermittent twitching.  Motor: Intermittent twitching of RUE.   Lab Results: Results for orders placed or performed during the hospital encounter of 11/18/2017 (from the past 48 hour(s))  CK     Status: Abnormal   Collection Time: 12/02/17  3:03 PM  Result Value Ref Range   Total CK 410 (H) 38 - 234 U/L    Comment: Performed at [facility], [place] [address], [place], [place] [address]  CBC     Status: Abnormal   Collection Time: 12/02/17  3:03 PM  Result Value Ref Range   WBC 8.7 4.0 - 10.5 K/uL   RBC 4.54 3.87 - 5.11 MIL/uL   Hemoglobin 11.5 (L) 12.0 - 15.0 g/dL   HCT 35.6 (L) 36.0 - 46.0 %   MCV 78.4 78.0 - 100.0 fL   MCH 25.3 (L) 26.0 - 34.0 pg   MCHC 32.3 30.0 - 36.0 g/dL   RDW 18.0  (H) 11.5 - 15.5 %   Platelets 315 150 - 400 K/uL    Comment: Performed at [facility], [place] [address], [place], [place] [address]  CBC     Status: Abnormal   Collection Time: 12/03/17  8:41 AM  Result Value Ref Range   WBC 8.0 4.0 - 10.5 K/uL   RBC 4.37 3.87 - 5.11 MIL/uL   Hemoglobin 11.2 (L) 12.0 - 15.0 g/dL   HCT 34.6 (L) 36.0 - 46.0 %   MCV 79.2 78.0 - 100.0 fL   MCH 25.6 (L) 26.0 - 34.0 pg   MCHC 32.4 30.0 - 36.0 g/dL   RDW 17.7 (H) 11.5 - 15.5 %   Platelets 283 150 - 400 K/uL    Comment: Performed at [facility] [address] [place],  [address]  Basic metabolic panel     Status: Abnormal   Collection Time: 12/03/17  8:41 AM  Result Value Ref Range   Sodium 144 135 - 145 mmol/L   Potassium 3.7 3.5 - 5.1 mmol/L   Chloride 111 98 - 111 mmol/L   CO2 24 22 - 32 mmol/L   Glucose, Bld 136 (H)  70 - 99 mg/dL   BUN 22 8 - 23 mg/dL   Creatinine, Ser 1.08 (H) 0.44 - 1.00 mg/dL   Calcium 9.4 8.9 - 10.3 mg/dL   GFR calc non Af Amer 48 (L) >60 mL/min   GFR calc Af Amer 56 (L) >60 mL/min    Comment: (NOTE) The eGFR has been calculated using the CKD EPI equation. This calculation has not been validated in all clinical situations. eGFR's persistently <60 mL/min signify possible Chronic Kidney Disease.    Anion gap 9 5 - 15    Comment: Performed at [facility] [address] [place], [place] [address]  Type and screen [place]     Status: None   Collection Time: 12/03/17 12:33 PM  Result Value Ref Range   ABO/RH(D) A POS    Antibody Screen NEG    Sample Expiration      12/06/2017 Performed at [facility], [place] [address], [place], [place] [address]   ABO/Rh     Status: None   Collection Time: 12/03/17 12:33 PM  Result Value Ref Range   ABO/RH(D)      A POS Performed at [facility] [address], [place], [place] [address]   Culture, Urine     Status: None   Collection Time: 12/03/17  1:50 PM  Result Value  Ref Range   Specimen Description URINE, CATHETERIZED    Special Requests NONE    Culture      NO GROWTH Performed at [facility], [place] [address] [place], [place] [address]    Report Status 12/04/2017 FINAL   Basic metabolic panel     Status: Abnormal   Collection Time: 12/04/17  4:06 AM  Result Value Ref Range   Sodium 144 135 - 145 mmol/L   Potassium 4.1 3.5 - 5.1 mmol/L   Chloride 112 (H) 98 - 111 mmol/L   CO2 24 22 - 32 mmol/L   Glucose, Bld 106 (H) 70 - 99 mg/dL   BUN 26 (H) 8 - 23 mg/dL   Creatinine, Ser 1.10 (H) 0.44 - 1.00 mg/dL   Calcium 9.5 8.9 - 10.3 mg/dL   GFR calc non Af Amer 47 (L) >60 mL/min   GFR calc Af Amer 54 (L) >60 mL/min    Comment: (NOTE) The eGFR has been calculated using the CKD EPI equation. This calculation has not been validated in all clinical situations. eGFR's persistently <60 mL/min signify possible Chronic Kidney Disease.    Anion gap 8 5 - 15    Comment: Performed at [facility] [address], [place], [place] [address]  CBC     Status: Abnormal   Collection Time: 12/04/17  4:06 AM  Result Value Ref Range   WBC 9.3 4.0 - 10.5 K/uL   RBC 5.44 (H) 3.87 - 5.11 MIL/uL   Hemoglobin 14.0 12.0 - 15.0 g/dL   HCT 43.2 36.0 - 46.0 %   MCV 79.4 78.0 - 100.0 fL   MCH 25.7 (L) 26.0 - 34.0 pg   MCHC 32.4 30.0 - 36.0 g/dL   RDW 18.7 (H) 11.5 - 15.5 %   Platelets 240 150 - 400 K/uL    Comment: Performed at [facility] [address] [place], [place] [address]    Recent Results (from the past 240 hour(s))  Culture, Urine     Status: None   Collection Time: 12/03/17  1:50 PM  Result  Value Ref Range Status   Specimen Description URINE, CATHETERIZED  Final   Special Requests NONE  Final   Culture   Final    NO GROWTH Performed at [facility], [address], [place], [place] [address]    Report Status 12/04/2017 FINAL  Final    Lipid Panel Recent Labs    12/02/17 0404  CHOL 155  TRIG 90  HDL 42  CHOLHDL 3.7   VLDL 18  LDLCALC 95    Studies/Results: Dg Chest 2 View  Result Date: 12/03/2017 CLINICAL DATA:  Altered mental status. EXAM: CHEST - 2 VIEW COMPARISON:  11/18/2017 FINDINGS: Lungs are adequately inflated and otherwise clear. Cardiomediastinal silhouette and remainder the exam is unchanged. IMPRESSION: No active cardiopulmonary disease. Electronically Signed   By: Marin Olp M.D.   On: 12/03/2017 13:14   Dg Elbow Complete Right (3+view)  Result Date: 12/02/2017 CLINICAL DATA:  Bruising of the right posterior humerus, the patient would not give appropriate history EXAM: RIGHT ELBOW - COMPLETE 3+ VIEW COMPARISON:  None. FINDINGS: Alignment is normal. The elbow joint spaces appear normal. No fracture is seen. No joint effusion is noted. IMPRESSION: Negative. Electronically Signed   By: Ivar Drape M.D.   On: 12/02/2017 17:22   Dg Humerus Right  Result Date: 12/02/2017 CLINICAL DATA:  Bruising of the right posterior humerus, patient would not give history EXAM: RIGHT HUMERUS - 2+ VIEW COMPARISON:  Right humerus films of 10/22/2009 FINDINGS: The right humerus is intact and normally aligned. There are degenerative changes the right shoulder particularly involving the right AC joint. No fracture is seen. IMPRESSION: No acute fracture.  Degenerative change of the right shoulder. Electronically Signed   By: Ivar Drape M.D.   On: 12/02/2017 17:21    Medications:  Scheduled: . aspirin EC  81 mg Oral Daily  . diltiazem  60 mg Oral Q8H  . heparin  5,000 Units Subcutaneous Q8H  . levETIRAcetam  1,000 mg Oral BID  . LORazepam      . pantoprazole sodium  40 mg Per Tube Daily   Continuous: . valproate sodium      Assessment: Breakthrough seizure off valproic acid. Started on Keppra in-house. Initial EEG was normal, then developed partial complex status epilepticus on 8/22 with continuous right sided twitching and EEG correlate, which was personally reviewed and also discussed with Dr. Candiss Norse.  Partial complex status epilepticus with right sided twitching, best localizable as a left sided seizure focus, correlates with recent MRI brain findings of subtle gyriform diffusion abnormality involving the parasagittal left parietooccipital region and possibly left thalamus.   Recommendations: 1. Loading with valproic acid 20 mg/kg which has been ordered STAT and RN has been notified. It is noted that LFTs are moderately elevated and that valproic acid was initially tapered off for this as an outpatient, however, will need to load valproate as it is the best IV choice for rapid control of her seizures. If resolved on EEG then will transition valproic acid to higher daily dose of Keppra and/or addition of Vimpat or phenobarbital. If not resolved on EEG or clinically, then will need to intubate and initiate burst suppression protocol.  2. Ativan 2 mg IV being administered STAT. 3. Continue Keppra at current dose of 1000 mg BID.   35 minutes spent in the emergent neurological evaluation and management of this critically ill patient in status epilepticus.   Addendum: After valproic acid load and 2 mg IV Ativan x 2, the patient's  clinical and electrographic seizures have resolved. LTM continuing. Will start Vimpat at 100 mg BID. Continue Keppra at 1000 mg BID. We will continue to follow with you.    LOS: 3 days   @Electronically  signed: Dr. Kerney Elbe 12/04/2017  11:44 AM

## 2017-12-05 DIAGNOSIS — G40901 Epilepsy, unspecified, not intractable, with status epilepticus: Secondary | ICD-10-CM

## 2017-12-05 LAB — CBC
HEMATOCRIT: 33.2 % — AB (ref 36.0–46.0)
Hemoglobin: 10.6 g/dL — ABNORMAL LOW (ref 12.0–15.0)
MCH: 25.6 pg — ABNORMAL LOW (ref 26.0–34.0)
MCHC: 31.9 g/dL (ref 30.0–36.0)
MCV: 80.2 fL (ref 78.0–100.0)
Platelets: 279 10*3/uL (ref 150–400)
RBC: 4.14 MIL/uL (ref 3.87–5.11)
RDW: 18.2 % — AB (ref 11.5–15.5)
WBC: 14 10*3/uL — AB (ref 4.0–10.5)

## 2017-12-05 LAB — BASIC METABOLIC PANEL
Anion gap: 5 (ref 5–15)
BUN: 22 mg/dL (ref 8–23)
CHLORIDE: 114 mmol/L — AB (ref 98–111)
CO2: 27 mmol/L (ref 22–32)
Calcium: 9.3 mg/dL (ref 8.9–10.3)
Creatinine, Ser: 1.07 mg/dL — ABNORMAL HIGH (ref 0.44–1.00)
GFR calc Af Amer: 56 mL/min — ABNORMAL LOW (ref >60)
GFR calc non Af Amer: 48 mL/min — ABNORMAL LOW (ref >60)
Glucose, Bld: 103 mg/dL — ABNORMAL HIGH (ref 70–99)
POTASSIUM: 4 mmol/L (ref 3.5–5.1)
SODIUM: 146 mmol/L — AB (ref 135–145)

## 2017-12-05 MED ORDER — LEVETIRACETAM IN NACL 1500 MG/100ML IV SOLN
1500.0000 mg | Freq: Two times a day (BID) | INTRAVENOUS | Status: DC
  Administered 2017-12-05 – 2017-12-21 (×32): 1500 mg via INTRAVENOUS
  Filled 2017-12-05 (×34): qty 100

## 2017-12-05 MED ORDER — LEVETIRACETAM 100 MG/ML PO SOLN: 1500.0000 mg | Freq: Two times a day (BID) | ORAL | Status: DC

## 2017-12-05 MED ORDER — SODIUM CHLORIDE 0.9 % IV SOLN
20.0000 mg/kg | Freq: Once | INTRAVENOUS | Status: AC
  Administered 2017-12-05: 1205 mg via INTRAVENOUS
  Filled 2017-12-05: qty 24.1

## 2017-12-05 MED ORDER — SODIUM CHLORIDE 0.9% FLUSH
10.0000 mL | INTRAVENOUS | Status: DC | PRN
  Administered 2017-12-05: 10 mL
  Filled 2017-12-05: qty 40

## 2017-12-05 MED ORDER — LEVETIRACETAM IN NACL 1000 MG/100ML IV SOLN: 1000.0000 mg | Freq: Two times a day (BID) | INTRAVENOUS | Status: DC

## 2017-12-05 MED ORDER — LEVETIRACETAM IN NACL 500 MG/100ML IV SOLN: 500.0000 mg | Freq: Once | INTRAVENOUS | Status: DC

## 2017-12-05 MED ORDER — LEVETIRACETAM IN NACL 500 MG/100ML IV SOLN
500.0000 mg | Freq: Once | INTRAVENOUS | Status: AC
  Administered 2017-12-05: 500 mg via INTRAVENOUS
  Filled 2017-12-05: qty 100

## 2017-12-05 MED ORDER — PHENYTOIN SODIUM 50 MG/ML IJ SOLN
100.0000 mg | Freq: Three times a day (TID) | INTRAMUSCULAR | Status: DC
  Administered 2017-12-06 – 2017-12-10 (×13): 100 mg via INTRAVENOUS
  Filled 2017-12-05 (×12): qty 2

## 2017-12-05 NOTE — Progress Notes (Addendum)
Subjective: No events overnight per chart.   Objective: Current vital signs: BP (!) 151/68 (BP Location: Right Arm)   Pulse 81   Temp 97.6 F (36.4 C) (Oral)   Resp 16   Wt 60.2 kg   SpO2 100%   BMI 23.51 kg/m  Vital signs in last 24 hours: Temp:  [97.6 F (36.4 C)-99 F (37.2 C)] 97.6 F (36.4 C) (08/23 0725) Pulse Rate:  [71-92] 81 (08/23 0725) Resp:  [12-20] 16 (08/23 0725) BP: (126-165)/(53-76) 151/68 (08/23 0725) SpO2:  [98 %-100 %] 100 % (08/23 0725)  Intake/Output from previous day: 08/22 0701 - 08/23 0700 In: 460 [P.O.:360; IV Piggyback:100] Out: 100 [Urine:100] Intake/Output this shift: No intake/output data recorded. Nutritional status:  Diet Order            DIET - DYS 1 Room service appropriate? Yes; Fluid consistency: Thin  Diet effective now              HEENT: [place]/AT Lungs: Respirations unlabored  Mental Status: Patient is asleep when neurologist enters the room. Awakens easily to a state characterized by decreased level of alertness. She continues to have a dense expressive and receptive aphasia with perseverative one word answers to all questions and commands. She will gaze at examiner with a somewhat blank expression.  Cranial Nerves: EOMI. Face is with subtle right droop. Right side of neck with subtle intermittent twitching best assessed with palpation. .  Motor: Intermittent semi-rhythmic twitching of RUE, in association with left shoulder and neck twitching. The twitching is low amplitude and subtle. There is 2/5 weakness of RUE. LUE is 4/5. BLE withdraw to plantar stimulation.     Lab Results: Results for orders placed or performed during the hospital encounter of 11/27/2017 (from the past 48 hour(s))  Type and screen [place]     Status: None   Collection Time: 12/03/17 12:33 PM  Result Value Ref Range   ABO/RH(D) A POS    Antibody Screen NEG    Sample Expiration      12/06/2017 Performed at [facility],  [place] [address], [place], [place] [address]   ABO/Rh     Status: None   Collection Time: 12/03/17 12:33 PM  Result Value Ref Range   ABO/RH(D)      A POS Performed at [facility] [address] [place], [place] [address]   Culture, Urine     Status: None   Collection Time: 12/03/17  1:50 PM  Result Value Ref Range   Specimen Description URINE, CATHETERIZED    Special Requests NONE    Culture      NO GROWTH Performed at [facility], [place] [address], [place], [place] [address]    Report Status 12/04/2017 FINAL   Basic metabolic panel     Status: Abnormal   Collection Time: 12/04/17  4:06 AM  Result Value Ref Range   Sodium 144 135 - 145 mmol/L   Potassium 4.1 3.5 - 5.1 mmol/L   Chloride 112 (H) 98 - 111 mmol/L   CO2 24 22 - 32 mmol/L   Glucose, Bld 106 (H) 70 - 99 mg/dL   BUN 26 (H) 8 - 23 mg/dL   Creatinine, Ser 1.10 (H) 0.44 - 1.00 mg/dL   Calcium 9.5 8.9 - 10.3 mg/dL   GFR calc non Af Amer 47 (L) >60 mL/min   GFR calc Af Amer 54 (L) >60 mL/min    Comment: (NOTE) The eGFR  has been calculated using the CKD EPI equation. This calculation has not been validated in all clinical situations. eGFR's persistently <60 mL/min signify possible Chronic Kidney Disease.    Anion gap 8 5 - 15    Comment: Performed at [facility] [address], [place], [place] [address]  CBC     Status: Abnormal   Collection Time: 12/04/17  4:06 AM  Result Value Ref Range   WBC 9.3 4.0 - 10.5 K/uL   RBC 5.44 (H) 3.87 - 5.11 MIL/uL   Hemoglobin 14.0 12.0 - 15.0 g/dL   HCT 43.2 36.0 - 46.0 %   MCV 79.4 78.0 - 100.0 fL   MCH 25.7 (L) 26.0 - 34.0 pg   MCHC 32.4 30.0 - 36.0 g/dL   RDW 18.7 (H) 11.5 - 15.5 %   Platelets 240 150 - 400 K/uL    Comment: Performed at [facility] [address] [place], [place] [address]  Blood gas, arterial     Status: Abnormal   Collection Time: 12/04/17  8:24 PM  Result Value Ref Range   FIO2 21.00    pH, Arterial 7.459 (H) 7.350 - 7.450    pCO2 arterial 36.6 32.0 - 48.0 mmHg   pO2, Arterial 76.2 (L) 83.0 - 108.0 mmHg   Bicarbonate 25.6 20.0 - 28.0 mmol/L   Acid-Base Excess 2.1 (H) 0.0 - 2.0 mmol/L   O2 Saturation 95.1 %   Patient temperature 98.6    Collection site LEFT RADIAL    Drawn by [id number]    Sample type ARTERIAL DRAW    Allens test (pass/fail) PASS PASS  Basic metabolic panel     Status: Abnormal   Collection Time: 12/05/17  4:42 AM  Result Value Ref Range   Sodium 146 (H) 135 - 145 mmol/L   Potassium 4.0 3.5 - 5.1 mmol/L   Chloride 114 (H) 98 - 111 mmol/L   CO2 27 22 - 32 mmol/L   Glucose, Bld 103 (H) 70 - 99 mg/dL   BUN 22 8 - 23 mg/dL   Creatinine, Ser 1.07 (H) 0.44 - 1.00 mg/dL   Calcium 9.3 8.9 - 10.3 mg/dL   GFR calc non Af Amer 48 (L) >60 mL/min   GFR calc Af Amer 56 (L) >60 mL/min    Comment: (NOTE) The eGFR has been calculated using the CKD EPI equation. This calculation has not been validated in all clinical situations. eGFR's persistently <60 mL/min signify possible Chronic Kidney Disease.    Anion gap 5 5 - 15    Comment: Performed at [facility] [address], [place], [place] [address]  CBC     Status: Abnormal   Collection Time: 12/05/17  4:42 AM  Result Value Ref Range   WBC 14.0 (H) 4.0 - 10.5 K/uL    Comment: REPEATED TO VERIFY   RBC 4.14 3.87 - 5.11 MIL/uL   Hemoglobin 10.6 (L) 12.0 - 15.0 g/dL    Comment: REPEATED TO VERIFY   HCT 33.2 (L) 36.0 - 46.0 %   MCV 80.2 78.0 - 100.0 fL   MCH 25.6 (L) 26.0 - 34.0 pg   MCHC 31.9 30.0 - 36.0 g/dL   RDW 18.2 (H) 11.5 - 15.5 %   Platelets 279 150 - 400 K/uL    Comment: CONSISTENT WITH PREVIOUS RESULT Performed at [facility] [address], [place], [place] [address]     Recent Results (from the past 240 hour(s))  Culture,  Urine     Status: None   Collection Time: 12/03/17  1:50 PM  Result Value Ref Range Status   Specimen Description URINE, CATHETERIZED  Final   Special Requests NONE  Final   Culture   Final     NO GROWTH Performed at [facility], [address], [place], [place] [address]    Report Status 12/04/2017 FINAL  Final    Lipid Panel No results for input(s): CHOL, TRIG, HDL, CHOLHDL, VLDL, LDLCALC in the last 72 hours.  Studies/Results: Dg Chest 2 View  Result Date: 12/03/2017 CLINICAL DATA:  Altered mental status. EXAM: CHEST - 2 VIEW COMPARISON:  11/29/2017 FINDINGS: Lungs are adequately inflated and otherwise clear. Cardiomediastinal silhouette and remainder the exam is unchanged. IMPRESSION: No active cardiopulmonary disease. Electronically Signed   By: Marin Olp M.D.   On: 12/03/2017 13:14    Medications:  Scheduled: . aspirin EC  81 mg Oral Daily  . diltiazem  60 mg Oral Q8H  . heparin  5,000 Units Subcutaneous Q8H  . levETIRAcetam  1,000 mg Oral BID  . pantoprazole sodium  40 mg Per Tube Daily   Continuous: . lacosamide (VIMPAT) IV 200 mg (12/04/17 2231)   LTM EEG: INTERPRETATION: This is an abnormal EEG due to: 1) left posterior nonconvulsive status at the beginning of the recording, followed by left-sided periodic discharges  2) diffuse slowing Clinical Correlation: The EEG showed nonconvulsive status at the beginning of the recording, but resolved, but 0.5 to 1 Hz periodic discharges continued in the left posterior region.  Assessment:Breakthrough seizure off valproic acid. Started on Keppra in-house. Initial EEG was normal, then developed partial complex status epilepticus on 8/22 with continuous right sided twitching and EEG correlate, which was personally reviewed and also discussed with Dr. Candiss Norse. Partial complex status epilepticus with right sided twitching, best localizable as a left sided seizure focus, correlates with recent MRI brain findings of subtle gyriform diffusion abnormality involving the parasagittal left parietooccipital region and possibly left thalamus.  1. Today, 8/23, she is exhibiting semirhythmic delta activity on bedside EEG with  intermittent subtle right neck, shoulder and arm twitching.  2. RUE is 2/5 today, consistent with Todd's paresis and suggestive of intermittent or ongoing subtle seizure activity without definite EEG correlate based upon EEG report.   Recommendations: 1.Increase Keppra to 1500 mg BID from 1000 mg BID after supplemental load of 500 mg x 2. 2. Continue Vimpat at 200 mg BID. 3. If seizure activity continues on clinical exam or if it recurs on EEG then will start phenobarbital or Dilantin. If still not resolved on EEG or clinically, then will need to consider intubation and initiation of burst suppression protocol. I am concerned that given her age and relative frailty, that she may not be extubatable. It is for this reason that I am pursuing a regimen for seizure cessation that does not require intubation.  4. Continue LTM    LOS: 4 days   _0  signed: Dr. Kerney Elbe 12/05/2017  10:36 AM

## 2017-12-05 NOTE — Consult Note (Signed)
Patient appears to be still having seizures per medical record review and has been unable to participate in interview/exam with primary team today. Please consult psychiatry when patient is more stable and/or able to participate in interview.   Buford Dresser, DO 12/05/17 2:36 PM

## 2017-12-05 NOTE — Procedures (Signed)
Electroencephalogram report- LTM  Ordering Physician : Cheral Marker, MD    Beginning date and time: 12/04/2017 952 hours Ending date and time: 12/05/2017 0730 hrs.  Day of study: 1  Medications include: Depakote, Keppra, Vimpat  HISTORY: This 24 hours of intensive EEG monitoring with simultaneous video monitoring was performed for this patient with right-sided twitching and altered mental status.  This EEG was requested to rule out subclinical electrographic seizures.  TECHNICAL DESCRIPTION:  The study consists of a continuous 16-channel multi-montage digital video EEG recording with twenty-one electrodes placed according to the International 10-20 System. Additional leads included eye leads, true temporal leads (T1, T2), and an EKG lead. Activation procedures were not done due to mental status.  REPORT:   BACKGROUND: The background activity consists of polymorphic delta, theta activity.  At the beginning of the recording continuous rhythmic delta activity intermixed with spikes was seen in the left posterior region, consistent with nonconvulsive status.  As the recording progressed, the rhythmic activity abated, but continuous 0.5 to 1 Hz periodic discharges were seen in the left posterior region.  The PLEDs became less prominent as the recording progressed.   There were no pushbutton activations events during this recording.   INTERPRETATION: This is an abnormal EEG due to: 1) left posterior nonconvulsive status at the beginning of the recording, followed by left-sided periodic discharges  2) diffuse slowing  Clinical Correlation: The EEG showed nonconvulsive status at the beginning of the recording, but resolved, but 0.5 to 1 Hz periodic discharges continued in the left posterior region.  The report was verbally discussed with Dr. Cheral Marker yesterday morning at the beginning of the recording.

## 2017-12-05 NOTE — Progress Notes (Addendum)
Family Medicine Teaching Service Daily Progress Note Intern Pager: [phone]  Patient name: [name] Medical record number: [id number] Date of birth: 19-Aug-1940 Age: 77 y.o. Gender: female  Primary Care Provider: Everrett Coombe, MD Consultants: Neuro, palliative, psychiatry Code Status: Full Code  Pt Overview and Major Events to Date:  8/19 admitted for AMS, found down up to 24 hours 8/22 given ativan and dilantin for clinical seizure activity noted around 11 AM  Assessment and Plan: [name] a 77 y.o.femalepresenting with AMS and being found down with last known normal on Sunday 2:30pm likely due to seizure at home. PMH is significant forHCC, Schizoaffective Disorder,HxSeizure, Chronic D CHF, HLD, HTN, CKD III, GERD.   Continuous Seizure Activity - we are following neurology recommendations. RUE weakness and aphasia thought to be symptoms of intractable continuous seizure activity per neurology. - continuous EEG - monitor on telemetry - Phenytoin 20 mg/kg IV load followed by 100 mg IV TID, first dose this AM 8/23 - monitor BP during phenytoin loading dose - continue keppra 1500 mg q12H per neuro - continue vimpat (lacosamide) 200 mg q12H per neuro - seizure precautions - appreciate neuro recs - if seizure activity continues on clinical exam or recurs on EEG, plan is to start phenobarbital or dilantin, and next steps may include intubation and initiation of burst suppression protocol, however preference is to pursue more conservative management first as the patient may be difficult to extubate.   AMS - This AM, opens her eyes but does not track. Does not follow commands. AMS most likely due to seizure activity/post-ictal/multiple sedating meds for seizure.  -  Seizure management as above - transitioning back to NPO, can give back dysphagia 1 diet if she is less altered later today - will add mIVF while altered/taking poor PO  Schizoeffective/Bipolar disorder -  previously well-controlled on depakote and risperdol, however depakote had been titrated down due to elevated LFTs which were most likely abnormal due to [facility]. - holding home Risperdal for ongoing AMS, will likely need to add back prior to DC, pending psych recs - valproate has been added for seizures as noted above  - plan to re-consult psych for additional assistance in med management once patient is able to participate in the interview  Fall at Home, unwitnessed. Seems most likely due to seizure. Patient underwent trauma workup including R tib/fib XR, R Humerus and elbow XR, CXR which were all negative for bony abnormalities.  - avoid sedating pain medications d/t AMS  Leukocytosis - likely due to ongoing seizure activity. No evidence of infection, no fevers, vitals stable. Continue to correlate clinically.  Known Hepatocellular Carcinoma: Confirmed by Biopsy 2018 Elevated LFTs.Patient declines treatment or oncology referral, feels that her cancer is in God's hands. She has expressed this to her daughters as well. - palliative meeting for [place] scheduled for 1 PM on 8/26, would try to have a primary team member at the meeting per the daughters' wishes  HTN - 120-154/59-19 overnight. On home diltiazem 60 mg q8H. Missed her 2PM dose yesterday 8/23 due to drowsiness. - home dilt is ordered but she will be unable to take it while NPO - PRN hydral for higher pressures while NPO  HLD No home statin. Has chronically elevated LFTs 2/2 HCC. On ASA 81mg  at home. - cont home ASA 81mg   CKD III Cr 1.22 on admission, appears at baseline. AM BMP pending - cont to monitor BMP  GERD Protonix at home. - started protonix in oral suspension  Anemia -  BL between 10-12. Hgb Stable at baseline, monitored daily. Consider anemia workup when patient is not acutely ill, possibly on outpatient side.  FEN/GI:dysphagia 1 Prophylaxis:Heparin  Disposition: Continue to monitor on telemetry, anticipate several  more inpatient days given continued seizure management  Subjective:  Patient lies in bed this morning. Opens her eyes when I enter the room but does not track. Maybe a flicker of a squeeze when I ask her to squeeze my hand on the left side. Does not squeeze my hand on the right. Otherwise does not follow commands at all. This is a significant change from the BL I have seen during her office visits. In her office visits prior to this hospitalization she was very oriented, well-groomed, pleasant, and seems fairly knowledgeable about her care.  Objective: Temp:  [97.4 F (36.3 C)-98.8 F (37.1 C)] 98.8 F (37.1 C) (08/24 0304) Pulse Rate:  [81-97] 92 (08/24 0304) Resp:  [16-18] 18 (08/24 0304) BP: (121-161)/(59-84) 154/79 (08/24 0304) SpO2:  [98 %-100 %] 99 % (08/24 0304) Physical Exam: General: rests in bed, opens eyes to voice but does not follow commands Cardiovascular: RRR, no m/r/g Respiratory: CTA bil, non-labored, no W/R/R anteriorly Abdomen: soft, nondistended, no HSM Extremities: warm and well-perfused  Laboratory: Recent Labs  Lab 12/04/17 0406 12/05/17 0442 12/05/17 0950  WBC 9.3 14.0* 13.3*  HGB 14.0 10.6* 10.4*  HCT 43.2 33.2* 34.0*  PLT 240 279 136*   Recent Labs  Lab 11/14/2017 1800  12/03/17 0841 12/04/17 0406 12/05/17 0442  NA 138   < > 144 144 146*  K 4.4   < > 3.7 4.1 4.0  CL 97*   < > 111 112* 114*  CO2 26   < > 24 24 27   BUN 20   < > 22 26* 22  CREATININE 1.22*   < > 1.08* 1.10* 1.07*  CALCIUM 10.8*   < > 9.4 9.5 9.3  PROT 7.5  --   --   --   --   BILITOT 1.1  --   --   --   --   ALKPHOS 217*  --   --   --   --   ALT 138*  --   --   --   --   AST 115*  --   --   --   --   GLUCOSE 120*   < > 136* 106* 103*   < > = values in this interval not displayed.   Imaging/Diagnostic Tests: Dg Chest 2 View IMPRESSION: No active cardiopulmonary disease.  Dg Elbow Complete Right (3+view)  IMPRESSION: Negative.   Dg Humerus Right IMPRESSION: No acute  fracture.  Degenerative change of the right shoulder.  Mr Jodene Nam Head Wo Contrast IMPRESSION:  1. Question subtle gyriform diffusion abnormality involving the parasagittal left parietooccipital region and possibly left thalamus. Primary consideration would be subtle changes related to seizure. Correlation with history and EEG suggested. Sequelae of mild ischemia would be the primary differential consideration.  2. Moderate atrophy with mild chronic small vessel ischemic disease.   MRA HEAD IMPRESSION: Stable appearance of the intracranial circulation. No large vessel occlusion. No hemodynamically significant or correctable stenosis   Everrett Coombe, MD 12/06/2017, 6:21 AM PGY-3, [place] Intern pager: [phone], text pages welcome

## 2017-12-05 NOTE — Progress Notes (Signed)
PT Cancellation Note  Patient Details Name: [name] MRN: [id number] DOB: 06-Oct-1940   Cancelled Treatment:    Reason Eval/Treat Not Completed: Other (comment). Family present declining treatment, stating that pt just received medication and is resting. Requesting therapy return at a later time. Will check back as time allows.   Benjiman Core, PTA Pager [phone] Acute Rehab   Allena Katz 12/05/2017, 12:35 PM

## 2017-12-05 NOTE — Progress Notes (Signed)
Patient reevaluated. Still with global aphasia and RUE weakness. On current exam she does not move RUE to command and it is flaccid. Subtle semirhythmic muscle twitching palpated along posterior right deltoid, but no biceps, triceps, forearm, digit or right neck twitching palpable or visible.   Equivocal semirhythmic delta wave activity seen on EEG on a background of diffuse medium amplitude slowing.   Assessment: Intractable partial complex seizures  Recommendations: Adding Dilantin to regimen with 20 mg/kg IV load followed by 100 mg IV TID with first scheduled dose tomorrow morning. RN has been notified to monitor BP closely during phenytoin loading dose. Continue LTM. Discussed the above with family, who expressed understanding and agreement with the plan.   Electronically signed: Dr. Kerney Elbe

## 2017-12-05 NOTE — Progress Notes (Addendum)
Family Medicine Teaching Service Daily Progress Note Intern Pager: [phone]  Patient name: [name] Medical record number: [id number] Date of birth: 1941-01-21 Age: 77 y.o. Gender: female  Primary Care Provider: Everrett Coombe, MD  Code Status: full but needs to be clarified Consultations: neurology, PT/OT, speech, palliative, psych Admission date: 8/19  Pt Overview and Major Events to Date:  8/19 admitted for AMS, found down up to 24 hours  Subjective: Overnight: patient was very sedated on ativan stable vitals and ABG were reassuring, patient's family came to see her Today: Patient had eyes open for exam and tried to answer questions but was harder to understand today and less alert. She is on continuous EEG. Patient was getting an IV placed at time of exam.  Objective: Blood pressure (!) 151/68, pulse 81, temperature 97.6 F (36.4 C), temperature source Oral, resp. rate 16, weight 60.2 kg, SpO2 100 %.  Physical Exam: General: Patient maintains good eye contact during conversation. Difficult to understand Cardiovascular: regular heart rate with regular rhythm and a mild systolic murmer, S3 heard on exam Respiratory: no adventitious sounds were appreciated, patient was not participating Abdomen: soft, non-distended, no masses. Palpation did not seem to illicit a pain response from patient. There was no skin changes here Neuro: patient was not oriented to person, place, time. Patient less alert than yesterday Extremities: patient had multiple areas on extremities that appeared to be non blanchable, non-raised ecchymoses that appear to have improved from yesterday- they are warm to the touch  Assessment and Plan: TEZRA MAHR is a 77 y.o. female presenting with AMS and being found down with last known normal on Sunday 2:30pm likely due to seizure at home. PMH is significant for HCC, Schizoaffective Disorder, Hx Seizure, Chronic D CHF, HLD, HTN, CKD III, GERD.   AMS 2/2 seizure  and medication worsened today.At baseline, oriented to person and place, lives alone and functions well. She was not oriented to self, place, or time at time of my exam. Most likely related to continued seizures and ativan use. Chest xray shows no active cardiopulmonary disease. Urine culture from in and out cath pending.Psychiatry consulted and saw her yesterday but she was too sedated so they plan to see her again today- will follow up for those recs. Palliative is following and I informed them that the family has arrived so they can meet with them again. ABG done overnight after patient was sedated post lorazepam dosing negative for hypoxia.  - continue on telemetry - vitals per floor protocol q4h  - follow neuro recs  -continue Keppra  -added vimpat (Lacosamide)  -if seizures reoccur, will need to reload with alternative anticonvulsant such as Dilantin (Phenytoin) and monitor LFTs -follow psych recs -speech therapy recs  -dysphagia 1 (puree); thin liquid diet - PT/OT consulted - cont home ASA 81mg  daily  CBC abnormalities- WBC has been gradually increasing this admission with no clear source of infection and vitals stable with possible contributor of stress of seizures and admission. Highest was 14.0 this morning but repeat decreased to 13.3-No Abx at this time. Hemoglobin has also been gradually trending down from 11.2 to 10.4 this morning with no obvious signs of active bleeding. Patient was admitted with multiple ecchymoses which have continued to improve on exam every day.   -continue to follow CBC  Fall at Home, unwitnessed. Patient found lying on her back after potentially 24hrs down. CXR negative for acute bony abnormalities, Right Tib/Fib X-ray neagtive for acute bony abnormalities, pelvis XR  negative for acute abnormality. humerus and elbow xray negative. Was in ED on 8/17 for weakness and requested Nursing Home placement. Hx Vasovagal syncope from Cards note in January 2019. Lives  alone with home health and family to check on her. -ordered fall precautions  Known Hepatocellular Carcinoma: Confirmed by Biopsy 2018 Elevated LFTs, AST 115, ALT138.  Patient has refused treatment in the past. Admitting resident is her PCP. She has strongly indicated in multiple conversations in 05/2017 and 11/2017 that she would not want referral to oncology, she would not want any invasive procedures for further workup or treatment of her cancer. She feels that her cancer is "in God's hands." She has been considering palliative consult as an outpatient but did not feel ready for this yet at her last office visit. - Palliative consulted and spoke to family about this - avoid hepatotoxic agents -monitor CMPs  Schizoaffective disorder patient's daughter states that she had a similar admission in 2014 that required inpatient psychiatry management called it a "break". Unsure why patient's psych meds were held at admission. Patient has previously told PCP that she is very addiment about continuing to take these meds because they work well for her. On Depakote and Risperdal at home. Depakote Level <10 on admission -consulted Psychiatry who will see patient again today  Goals of care Patient has [place] that she has expressed to PCP she would not like treated and would like to leave in "God's hands."  Brother is unsure of patient's wishes regarding code status but states that he believes "she would want it all done."  Daughter spoke to palliative and expressed that she is in agreement with mother's desire to not seek treatment. Patient was also recently in the ED requesting Nursing Home placement due to weakness. - Palliative care consulted and notified that patient's family is here to have meeting -monitor when patient is more alert and oriented - Will address code status   Hx Seziures 01/12/2013 notes states seizure activity witnessed.  Brother unaware of any history of seizures.  On depakote at home.  If valproic acid resolves seizures on EEG, can transition to higher dose of Keppra and/or addition of vimpat or phenobarbital. If not resolved on EEG or clinically, then will need to intubate and initiate burst suppression protocol. Looks like valproic acid load and ativan x2 resolved clinical and EEG seizures to neuro started vimpat 200mg  BID and continued keppra 1000mg  BID and will continue to follow - follow Neuro recs,  -Keppra 100mg  BID -Valproate 1000mg  given once yesterday night for continued seizure activity -valproate 1200mg  given once yesterday for continued seizure activity -lorazepam 2mg  given once last night for continued seizure activity -lacosamide 200mg  q12 added -continuous EEG  HLD No home statin.  Has chronically elevated LFTs 2/2 HCC.  On ASA 81mg  at home. -cont home ASA 81mg  - f/u Neuro recs for treatment  HTN On Diltiazem at home. Last BP 155/68 - restarted diltiazem 60mg  q8 hours  CKD III Cr 1.22 on admission, appears at baseline. - cont to monitor BMP  GERD Protonix at home. - started protonix in oral suspension  FEN/GI: dysphagia 1 Prophylaxis: Heparin  Disposition: telemetry  Laboratory: Recent Labs  Lab 12/03/17 0841 12/04/17 0406 12/05/17 0442  WBC 8.0 9.3 14.0*  HGB 11.2* 14.0 10.6*  HCT 34.6* 43.2 33.2*  PLT 283 240 279   Recent Labs  Lab 11/26/2017 1800  12/03/17 0841 12/04/17 0406 12/05/17 0442  NA 138   < > 144 144 146*  K 4.4   < > 3.7 4.1 4.0  CL 97*   < > 111 112* 114*  CO2 26   < > 24 24 27   BUN 20   < > 22 26* 22  CREATININE 1.22*   < > 1.08* 1.10* 1.07*  CALCIUM 10.8*   < > 9.4 9.5 9.3  PROT 7.5  --   --   --   --   BILITOT 1.1  --   --   --   --   ALKPHOS 217*  --   --   --   --   ALT 138*  --   --   --   --   AST 115*  --   --   --   --   GLUCOSE 120*   < > 136* 106* 103*   < > = values in this interval not displayed.    Imaging/Diagnostic Tests: CTA head, neck, MR head Details in plan Xray of right humerus  and elbow negative for fracture  Richarda Osmond, DO 12/05/2017, 6:55 AM PGY-1, [place] Intern pager: [phone], text pages welcome  -------------------------------------------------------------------

## 2017-12-05 NOTE — Progress Notes (Signed)
Received call that family was in early earlier than Monday for goals of care meeting.  Pt seen, chart reviewed.  Patient unable to participate in assessment secondary to cognition as well as speech impairment. Reached out to patient's daughter, Barnett Applebaum.  She states that she and her family were hoping to meet with patient's primary attending physician not palliative today.  She stated that she still wants to have goals of care meeting set for Monday at 1 PM.  Updated bedside nurse.  Sydnee Cabal the palliative medicine team phone number and reiterated that I was working this weekend and available for further consultation should she decide to meet earlier than Monday Thank you, Romona Curls, NP Total time: 20 minutes

## 2017-12-05 NOTE — Progress Notes (Signed)
OT Cancellation Note  Patient Details Name: [name] MRN: [id number] DOB: Mar 17, 1941   Cancelled Treatment:    Reason Eval/Treat Not Completed: Fatigue/lethargy limiting ability to participate. Pt is sleeping and recently medicated, Family requesting therapy hold at this time. OT will attempt again as schedule allows. It was explained that OT might not be able to return until next week. Family understands.  [place] 12/05/2017, 1:01 PM  Hulda Humphrey OTR/L [phone]

## 2017-12-05 NOTE — Progress Notes (Signed)
SLP Cancellation Note  Patient Details Name: [name] MRN: [id number] DOB: Apr 18, 1940   Cancelled treatment:       Reason Eval/Treat Not Completed: Fatigue/lethargy limiting ability to participate  Deneise Lever, [place], CCC-SLP Speech-Language Pathologist [place] 12/05/2017, 3:05 PM

## 2017-12-05 NOTE — Progress Notes (Signed)
LTM EEG checked, no skin breakdown on frontal leads.

## 2017-12-06 DIAGNOSIS — E870 Hyperosmolality and hypernatremia: Secondary | ICD-10-CM

## 2017-12-06 LAB — BASIC METABOLIC PANEL
Anion gap: 9 (ref 5–15)
Anion gap: 8 (ref 5–15)
BUN: 25 mg/dL — ABNORMAL HIGH (ref 8–23)
BUN: 20 mg/dL (ref 8–23)
CALCIUM: 9.7 mg/dL (ref 8.9–10.3)
CO2: 25 mmol/L (ref 22–32)
CO2: 25 mmol/L (ref 22–32)
CREATININE: 1.36 mg/dL — AB (ref 0.44–1.00)
Calcium: 9.7 mg/dL (ref 8.9–10.3)
Chloride: 116 mmol/L — ABNORMAL HIGH (ref 98–111)
Chloride: 115 mmol/L — ABNORMAL HIGH (ref 98–111)
Creatinine, Ser: 1.14 mg/dL — ABNORMAL HIGH (ref 0.44–1.00)
GFR, EST AFRICAN AMERICAN: 42 mL/min — AB (ref >60)
GFR, EST AFRICAN AMERICAN: 52 mL/min — AB (ref >60)
GFR, EST NON AFRICAN AMERICAN: 36 mL/min — AB (ref >60)
GFR, EST NON AFRICAN AMERICAN: 45 mL/min — AB (ref >60)
Glucose, Bld: 114 mg/dL — ABNORMAL HIGH (ref 70–99)
Glucose, Bld: 196 mg/dL — ABNORMAL HIGH (ref 70–99)
Potassium: 5.2 mmol/L — ABNORMAL HIGH (ref 3.5–5.1)
Potassium: 4 mmol/L (ref 3.5–5.1)
SODIUM: 150 mmol/L — AB (ref 135–145)
SODIUM: 148 mmol/L — AB (ref 135–145)

## 2017-12-06 LAB — CBC
HCT: 33.5 % — ABNORMAL LOW (ref 36.0–46.0)
HEMATOCRIT: 34 % — AB (ref 36.0–46.0)
HEMOGLOBIN: 10.4 g/dL — AB (ref 12.0–15.0)
Hemoglobin: 10.8 g/dL — ABNORMAL LOW (ref 12.0–15.0)
MCH: 25.6 pg — AB (ref 26.0–34.0)
MCH: 25.6 pg — AB (ref 26.0–34.0)
MCHC: 30.6 g/dL (ref 30.0–36.0)
MCHC: 32.2 g/dL (ref 30.0–36.0)
MCV: 83.5 fL (ref 78.0–100.0)
MCV: 79.4 fL (ref 78.0–100.0)
PLATELETS: 286 10*3/uL (ref 150–400)
Platelets: 136 10*3/uL — ABNORMAL LOW (ref 150–400)
RBC: 4.07 MIL/uL (ref 3.87–5.11)
RBC: 4.22 MIL/uL (ref 3.87–5.11)
RDW: 21.5 % — ABNORMAL HIGH (ref 11.5–15.5)
RDW: 18.6 % — ABNORMAL HIGH (ref 11.5–15.5)
WBC: 13.3 10*3/uL — ABNORMAL HIGH (ref 4.0–10.5)
WBC: 14.2 10*3/uL — AB (ref 4.0–10.5)

## 2017-12-06 LAB — PHENYTOIN LEVEL, TOTAL: PHENYTOIN LVL: 13.8 ug/mL (ref 10.0–20.0)

## 2017-12-06 MED ORDER — SODIUM CHLORIDE 0.9 % IV SOLN
INTRAVENOUS | Status: DC
  Administered 2017-12-06: 10:00:00 via INTRAVENOUS

## 2017-12-06 MED ORDER — HYDRALAZINE HCL 20 MG/ML IJ SOLN
5.0000 mg | INTRAMUSCULAR | Status: DC | PRN
  Administered 2017-12-07: 5 mg via INTRAVENOUS
  Filled 2017-12-06: qty 1

## 2017-12-06 MED ORDER — DEXTROSE-NACL 5-0.45 % IV SOLN
INTRAVENOUS | Status: DC
  Administered 2017-12-06 – 2017-12-11 (×9): via INTRAVENOUS

## 2017-12-06 NOTE — Progress Notes (Signed)
LTM maint complete. No skin break down under Fp1, F7, and EKGs.

## 2017-12-06 NOTE — Progress Notes (Signed)
Subjective: Opens eyes but does not track and does not follow commands. She has been transitioned back to NPO.  Objective: Current vital signs: BP (!) 169/78 (BP Location: Left Arm)   Pulse 91   Temp 97.6 F (36.4 C) (Oral)   Resp 18   Wt 60.2 kg   SpO2 100%   BMI 23.51 kg/m  Vital signs in last 24 hours: Temp:  [97.4 F (36.3 C)-98.8 F (37.1 C)] 97.6 F (36.4 C) (08/24 1154) Pulse Rate:  [83-97] 91 (08/24 1154) Resp:  [16-18] 18 (08/24 1154) BP: (121-169)/(59-84) 169/78 (08/24 1154) SpO2:  [98 %-100 %] 100 % (08/24 1154)  Intake/Output from previous day: 08/23 0701 - 08/24 0700 In: 300.6 [IV Piggyback:300.6] Out: 425 [Urine:425] Intake/Output this shift: No intake/output data recorded. Nutritional status:  Diet Order            Diet NPO time specified  Diet effective now              HEENT: [place]/AT Lungs: Respirations unlabored  Mental Status: Patient isasleep when neurologist enters the room. Awakens easily to a state characterized by decreased level of alertness. She continues to have a dense expressive and receptive aphasia. Does not attempt to answer questions and does not follow any commands. She will briefly gaze at examiner with a blank expression. Cranial Nerves:EOMI. Face is with subtle right droop. Right side of neck without twitching (resolved from yesterday).  Motor:RUE twitching seen yesterday has resolved. RUE flaccid without movement to any stimuli. Some movement of LUE to stimulation. BLE withdraw to plantar stimulation equally.   Lab Results: Results for orders placed or performed during the hospital encounter of 11/28/2017 (from the past 48 hour(s))  Blood gas, arterial     Status: Abnormal   Collection Time: 12/04/17  8:24 PM  Result Value Ref Range   FIO2 21.00    pH, Arterial 7.459 (H) 7.350 - 7.450   pCO2 arterial 36.6 32.0 - 48.0 mmHg   pO2, Arterial 76.2 (L) 83.0 - 108.0 mmHg   Bicarbonate 25.6 20.0 - 28.0 mmol/L   Acid-Base Excess  2.1 (H) 0.0 - 2.0 mmol/L   O2 Saturation 95.1 %   Patient temperature 98.6    Collection site LEFT RADIAL    Drawn by [id number]    Sample type ARTERIAL DRAW    Allens test (pass/fail) PASS PASS  Basic metabolic panel     Status: Abnormal   Collection Time: 12/05/17  4:42 AM  Result Value Ref Range   Sodium 146 (H) 135 - 145 mmol/L   Potassium 4.0 3.5 - 5.1 mmol/L   Chloride 114 (H) 98 - 111 mmol/L   CO2 27 22 - 32 mmol/L   Glucose, Bld 103 (H) 70 - 99 mg/dL   BUN 22 8 - 23 mg/dL   Creatinine, Ser 1.07 (H) 0.44 - 1.00 mg/dL   Calcium 9.3 8.9 - 10.3 mg/dL   GFR calc non Af Amer 48 (L) >60 mL/min   GFR calc Af Amer 56 (L) >60 mL/min    Comment: (NOTE) The eGFR has been calculated using the CKD EPI equation. This calculation has not been validated in all clinical situations. eGFR's persistently <60 mL/min signify possible Chronic Kidney Disease.    Anion gap 5 5 - 15    Comment: Performed at [facility] [address] [place], [place] [address]  CBC     Status: Abnormal   Collection Time: 12/05/17  4:42 AM  Result Value  Ref Range   WBC 14.0 (H) 4.0 - 10.5 K/uL    Comment: REPEATED TO VERIFY   RBC 4.14 3.87 - 5.11 MIL/uL   Hemoglobin 10.6 (L) 12.0 - 15.0 g/dL    Comment: REPEATED TO VERIFY   HCT 33.2 (L) 36.0 - 46.0 %   MCV 80.2 78.0 - 100.0 fL   MCH 25.6 (L) 26.0 - 34.0 pg   MCHC 31.9 30.0 - 36.0 g/dL   RDW 18.2 (H) 11.5 - 15.5 %   Platelets 279 150 - 400 K/uL    Comment: CONSISTENT WITH PREVIOUS RESULT Performed at [facility] [address], [place], [place] [address]   CBC     Status: Abnormal   Collection Time: 12/05/17  9:50 AM  Result Value Ref Range   WBC 13.3 (H) 4.0 - 10.5 K/uL   RBC 4.07 3.87 - 5.11 MIL/uL   Hemoglobin 10.4 (L) 12.0 - 15.0 g/dL   HCT 34.0 (L) 36.0 - 46.0 %   MCV 83.5 78.0 - 100.0 fL   MCH 25.6 (L) 26.0 - 34.0 pg   MCHC 30.6 30.0 - 36.0 g/dL   RDW 21.5 (H) 11.5 - 15.5 %   Platelets 136 (L) 150 - 400 K/uL    Comment: REPEATED  TO VERIFY DELTA CHECK NOTED QUESTIONABLE RESULTS, RECOMMEND RECOLLECT TO VERIFY Performed at [facility], [place] [address], [place], [place] [address]   Basic metabolic panel     Status: Abnormal   Collection Time: 12/06/17  5:54 AM  Result Value Ref Range   Sodium 150 (H) 135 - 145 mmol/L   Potassium 5.2 (H) 3.5 - 5.1 mmol/L   Chloride 116 (H) 98 - 111 mmol/L   CO2 25 22 - 32 mmol/L   Glucose, Bld 114 (H) 70 - 99 mg/dL   BUN 25 (H) 8 - 23 mg/dL   Creatinine, Ser 1.36 (H) 0.44 - 1.00 mg/dL   Calcium 9.7 8.9 - 10.3 mg/dL   GFR calc non Af Amer 36 (L) >60 mL/min   GFR calc Af Amer 42 (L) >60 mL/min    Comment: (NOTE) The eGFR has been calculated using the CKD EPI equation. This calculation has not been validated in all clinical situations. eGFR's persistently <60 mL/min signify possible Chronic Kidney Disease.    Anion gap 9 5 - 15    Comment: Performed at [facility] [address], [place], [place] [address]  CBC     Status: Abnormal   Collection Time: 12/06/17  5:54 AM  Result Value Ref Range   WBC 14.2 (H) 4.0 - 10.5 K/uL   RBC 4.22 3.87 - 5.11 MIL/uL   Hemoglobin 10.8 (L) 12.0 - 15.0 g/dL   HCT 33.5 (L) 36.0 - 46.0 %   MCV 79.4 78.0 - 100.0 fL   MCH 25.6 (L) 26.0 - 34.0 pg   MCHC 32.2 30.0 - 36.0 g/dL   RDW 18.6 (H) 11.5 - 15.5 %   Platelets 286 150 - 400 K/uL    Comment: CONSISTENT WITH PREVIOUS RESULT Performed at [facility], [place] [address], [place], [place] [address]     Recent Results (from the past 240 hour(s))  Culture, Urine     Status: None   Collection Time: 12/03/17  1:50 PM  Result Value Ref Range Status   Specimen Description URINE, CATHETERIZED  Final   Special Requests NONE  Final   Culture   Final    NO GROWTH  Performed at [facility], [place] [address] [place], [place] [address]    Report Status 12/04/2017 FINAL  Final    Lipid Panel No results for input(s): CHOL, TRIG, HDL, CHOLHDL, VLDL, LDLCALC in the last 72  hours.  Studies/Results: No results found.  Medications:  Scheduled: . aspirin EC  81 mg Oral Daily  . diltiazem  60 mg Oral Q8H  . heparin  5,000 Units Subcutaneous Q8H  . pantoprazole sodium  40 mg Per Tube Daily  . phenytoin (DILANTIN) IV  100 mg Intravenous Q8H   Continuous: . dextrose 5 % and 0.45% NaCl    . lacosamide (VIMPAT) IV 200 mg (12/06/17 1051)  . levETIRAcetam 1,500 mg (12/05/17 2301)   LTM EEG report for today: Pending  Assessment:Breakthrough seizureoff valproic acid. Started on Keppra in-house. Initial EEG was normal, then developed partial complex status epilepticus on 8/22 with continuous right sided twitching and EEG correlate. Partial complex status epilepticus with right sided twitching, best localizable as a left sided seizure focus, correlates with recentMRI brainfindings ofsubtle gyriform diffusion abnormality involving the parasagittal left parietooccipital region and possibly left thalamus. 1. Today, 8/24, her mentation has worsened and RUE continues to exhibit flaccid weakness, but semirhythmic delta activity seen on bedside EEG yesterday, as well as the subtle intermittent right neck, shoulder and arm twitching have resolved. Resolution of seizure activity on EEG and on exam most likely secondary to the addition of Dilantin to her regimen yesterday.  2. RUE exhibits flaccid weakness, consistent with Todd's paralysis.    3. Possible increased sedation today from addition of Dilantin. Will need a level drawn.  4. BUN and Cr are trending upwards, suggestive of possible volume depletion, which could be contributing to her worsened mental status. Other possible contributing factors include Keppra side effect and sedative effect of Dilantin as described in #3.  Recommendations: 1.Increased Keppra to 1500 mg BID from 1000 mg BID yesterday (Friday). 2. Continue Vimpat at 200 mg BID. 3. Dilantin was added to her regimen yesterday with a 20 mg/kg IV load  followed by 100 mg IV TID. 4. Continue LTM EEG. If seizure activity continues on clinical exam or if it recurs on EEG then will need to consider intubation and initiation of burst suppression protocol, versus addition of phenobarbital, which would likely increase her sedation. There is concern that given her age and relative frailty, she may not be extubatable if she is intubated for burst suppression. It is for this reason that a regimen for seizure cessation that does not require intubation is being pursued.  5. Would start IVF as she is now NPO and may be developing volume depletion. 6. Dilantin level has been ordered      LOS: 5 days   @Electronically  signed: Dr. Kerney Elbe 12/06/2017  12:01 PM

## 2017-12-07 LAB — CBC WITH DIFFERENTIAL/PLATELET
Abs Immature Granulocytes: 0.1 10*3/uL (ref 0.0–0.1)
Basophils Absolute: 0 10*3/uL (ref 0.0–0.1)
Basophils Relative: 0 %
EOS ABS: 0.1 10*3/uL (ref 0.0–0.7)
Eosinophils Relative: 1 %
HEMATOCRIT: 34.5 % — AB (ref 36.0–46.0)
HEMOGLOBIN: 10.7 g/dL — AB (ref 12.0–15.0)
IMMATURE GRANULOCYTES: 1 %
LYMPHS ABS: 2 10*3/uL (ref 0.7–4.0)
LYMPHS PCT: 17 %
MCH: 25.2 pg — ABNORMAL LOW (ref 26.0–34.0)
MCHC: 31 g/dL (ref 30.0–36.0)
MCV: 81.2 fL (ref 78.0–100.0)
Monocytes Absolute: 0.7 10*3/uL (ref 0.1–1.0)
Monocytes Relative: 6 %
NEUTROS ABS: 8.6 10*3/uL — AB (ref 1.7–7.7)
NEUTROS PCT: 75 %
Platelets: 285 10*3/uL (ref 150–400)
RBC: 4.25 MIL/uL (ref 3.87–5.11)
RDW: 18.9 % — AB (ref 11.5–15.5)
WBC: 11.4 10*3/uL — AB (ref 4.0–10.5)

## 2017-12-07 LAB — BASIC METABOLIC PANEL
Anion gap: 9 (ref 5–15)
BUN: 18 mg/dL (ref 8–23)
CHLORIDE: 113 mmol/L — AB (ref 98–111)
CO2: 25 mmol/L (ref 22–32)
Calcium: 9.6 mg/dL (ref 8.9–10.3)
Creatinine, Ser: 1.02 mg/dL — ABNORMAL HIGH (ref 0.44–1.00)
GFR calc Af Amer: 59 mL/min — ABNORMAL LOW (ref >60)
GFR calc non Af Amer: 51 mL/min — ABNORMAL LOW (ref >60)
Glucose, Bld: 152 mg/dL — ABNORMAL HIGH (ref 70–99)
POTASSIUM: 4 mmol/L (ref 3.5–5.1)
SODIUM: 147 mmol/L — AB (ref 135–145)

## 2017-12-07 LAB — HEPATIC FUNCTION PANEL
ALBUMIN: 2.5 g/dL — AB (ref 3.5–5.0)
ALT: 174 U/L — ABNORMAL HIGH (ref 0–44)
AST: 108 U/L — AB (ref 15–41)
Alkaline Phosphatase: 154 U/L — ABNORMAL HIGH (ref 38–126)
BILIRUBIN TOTAL: 0.7 mg/dL (ref 0.3–1.2)
Bilirubin, Direct: 0.3 mg/dL — ABNORMAL HIGH (ref 0.0–0.2)
Indirect Bilirubin: 0.4 mg/dL (ref 0.3–0.9)
Total Protein: 6 g/dL — ABNORMAL LOW (ref 6.5–8.1)

## 2017-12-07 MED ORDER — HYDRALAZINE HCL 20 MG/ML IJ SOLN
10.0000 mg | Freq: Once | INTRAMUSCULAR | Status: AC
  Administered 2017-12-07: 10 mg via INTRAVENOUS
  Filled 2017-12-07: qty 1

## 2017-12-07 MED ORDER — AMMONIUM LACTATE 12 % EX LOTN
TOPICAL_LOTION | Freq: Two times a day (BID) | CUTANEOUS | Status: DC
  Administered 2017-12-07 – 2017-12-11 (×10): via TOPICAL
  Administered 2017-12-12: 1 via TOPICAL
  Administered 2017-12-12 – 2017-12-17 (×10): via TOPICAL
  Administered 2017-12-17: 1 via TOPICAL
  Administered 2017-12-18 – 2017-12-20 (×6): via TOPICAL
  Administered 2017-12-21: 1 via TOPICAL
  Filled 2017-12-07: qty 222
  Filled 2017-12-07: qty 450

## 2017-12-07 NOTE — Procedures (Signed)
Electroencephalogram report- LTM  Ordering Physician : Cheral Marker, MD    Beginning date and time: 12/06/2017 07 30 am  Ending date and time: 12/07/2017 07 30 am   Day of study: 3 Cpt 95951  Medications include: Depakote, Keppra, Vimpat  HISTORY: This 24 hours of intensive EEG monitoring with simultaneous video monitoring was performed for this patient with right-sided twitching and altered mental status.  This EEG was requested to rule out subclinical electrographic seizures.  Data acquisition:  The study consists of a continuous 16-channel multi-montage digital video EEG recording with twenty-one electrodes placed according to the International 10-20 System. Additional leads included eye leads, true temporal leads (T1, T2), and an EKG lead. Activation procedures were not done due to mental status.  Day 2: Background activities marked by predominantly theta slowing ranging between 5 to 7 cps distributed broadly without well-defined posterior dominance.  Superimposed to very frequent left hemispheric sharp waves and spikes present maximum negativity mostly at C3 P3 negative field extending to posterior temporal and occipital cortex at times.  Also independent in the frequent left temporal spikes present.  In addition there is a right parietal sharp waves present however not as frequently as interictal epileptiform discharges across left hemisphere.  No clinical subclinical seizures present.  Day 3: No clinical or  subclinical seizures present.  Background activities marked by slowing and disorganization unchanged from previously recorded.  Superimposed multifocal sharp waves and spikes present throughout the recording however less prominent and not as frequent as during previous days of the recording.  Clinical interpretation: This is day 3 of intensive EEG monitoring with simultaneous video monitoring did not record any clinical subclinical seizures.  Background activities abnormal for 2 reasons.  #1  background activity slowing and disorganization suggestive of encephalopathy of nonspecific etiologies.  #2 multifocal sharp waves and spikes present throughout the recording most prominent across left hemisphere left parietal and posterior temporal cortex.  In addition occasionally right parietal sharp waves present suggestive of cortical irritability involving both hemispheres but more significantly across left hemisphere.  Clinical correlation is advised.

## 2017-12-07 NOTE — Progress Notes (Addendum)
Family Medicine Teaching Service Daily Progress Note Intern Pager: [phone]  Patient name: [name] Medical record number: [id number] Date of birth: 06-Mar-1941 Age: 77 y.o. Gender: female  Primary Care Provider: Everrett Coombe, MD Consultants: Neuro, palliative, psychiatry Code Status: Full Code  Pt Overview and Major Events to Date:  8/19 admitted for AMS, found down up to 24 hours 8/22 given ativan and Phenytoin for clinical seizure activity noted around 11 AM  Assessment and Plan: [name] T Reavesis a 77 y.o.femalepresenting with AMS and being found down with last known normal on Sunday 2:30pm likely due to seizure at home. PMH is significant forHCC, Schizoaffective Disorder,HxSeizure, Chronic D CHF, HLD, HTN, CKD III, GERD.   Continuous Seizure Activity - we are following neurology recommendations. Patient opened eyes to verbal stimuli but was not responsive to further interview and could not follow simple commands or track with eyes on exam today. BUN Cr are improving with fluid replenishment dextrose 5%-0.45% NS at 148mL/hr. Possible contributing factors of continued mental status decline include Keppra side effect and sedative effect of Phenytoin. Phenytoin level 13.8 yesterday. - continuous EEG - monitor on telemetry - continue Phenytoin 100mg  IV TID and monitor level - continue keppra 1500 mg q12H per neuro - continue vimpat (lacosamide) 200 mg q12H per neuro - monitor LFTs - seizure precautions -NPO - appreciate neuro recs  - if seizure activity continues on clinical exam or recurs on EEG, plan is to start phenobarbital or phenytoin, and next steps may include intubation and initiation of burst suppression protocol, however preference is to pursue more conservative management first as the patient may be difficult to extubate.   AMS - This AM, opens her eyes but does not track. Does not follow commands. AMS most likely due to seizure activity/post-ictal/multiple sedating  meds for seizure.  -  Seizure management as above - transitioned back to NPO, can give back dysphagia 1 diet if she is less altered later today - IVF while altered/NPO  Schizoeffective/Bipolar disorder - previously well-controlled on depakote and risperdol, however depakote had been titrated down due to elevated LFTs which were most likely abnormal due to [facility]. - holding home Risperdal for ongoing AMS, will likely need to add back prior to DC, pending psych recs - valproate has been added for seizures as noted above  - plan to re-consult psych for additional assistance in med management once patient is able to participate in the interview  Fall at Home, unwitnessed. Seems most likely due to seizure. Patient underwent trauma workup including R tib/fib XR, R Humerus and elbow XR, CXR which were all negative for bony abnormalities.  - avoid sedating pain medications d/t AMS  Leukocytosis - likely due to ongoing seizure activity. No evidence of infection, no fevers, vitals stable. Continue to correlate clinically. 11.4 WBC today improved since yesterday  Known Hepatocellular Carcinoma: Confirmed by Biopsy 2018 Elevated LFTs.Patient declines treatment or oncology referral, feels that her cancer is in God's hands. She has expressed this to her daughters as well. - palliative meeting for Bruce scheduled for 1 PM on 8/26, would try to have a primary team member at the meeting per the daughters' wishes -LFTs today  HTN - elevated over night and given 1 dose 10mg  hydralazine. BP 171/77 this morning. On home diltiazem 60 mg q8H but couldn't take overnight due to inability to swallow and is now NPO.  - home dilt is ordered but she will be unable to take it while NPO - PRN q4H hydral  5mg  IV systolic >643, diastolic >329  HLD No home statin. Has chronically elevated LFTs 2/2 HCC. On ASA 81mg  at home. - cont home ASA 81mg   CKD III Cr 1.22 on admission. Creatinine today is 1.02 improved from yesterday  with IV fluids on board - cont to monitor BMP  GERD Protonix at home. - started protonix in oral suspension  Anemia -  BL between 10-12. Hgb Stable at baseline, monitored daily. Consider anemia workup when patient is not acutely ill, possibly on outpatient side. Hgb 10.7 today  FEN/GI:NPO Prophylaxis:Heparin  Disposition: Continue to monitor on telemetry and EEG, anticipate several more inpatient days given continued seizure management  Subjective:  Overnight: given IV hydral for BP management since patient was not able to tolerate her oral BP medication. Today: continue to monitor patient's BP. Patient still on EEG monitor. Patient opened eyes to verbal stimuli. Was not responsive to further interview. Not able to follow simple commands such as "take deep breath" and "squeeze my hand". She had an intermittent dry cough throughout exam- did not hear anything on lung exam but was not able to properly perform exam without patient participation. WBC has been decreasing, and chest xray on admission was clear of infectious signs. Will continue to monitor. She had right eye discharge preventing her from opening eye fully so I ordered eye care. Patient has external cath which had dark yellow/orange urine with good volume.   Objective: Temp:  [97.6 F (36.4 C)-99.3 F (37.4 C)] 98.3 F (36.8 C) (08/25 0416) Pulse Rate:  [72-108] 108 (08/25 0416) Resp:  [16-20] 18 (08/25 0416) BP: (126-169)/(65-103) 131/103 (08/25 0416) SpO2:  [96 %-100 %] 96 % (08/25 0416)  Physical Exam: General: rests in bed, opens eyes to voice but does not follow commands, incomprehensible mumbles ocassionally Cardiovascular: RRR, no m/r/g Respiratory: CTA bil but poor exam without patient participation. Patient has dry cough intermittently. No increased work of breathing or accessory muscle use Abdomen: soft, nondistended, no signs of tenderness to palpation.  GU: external catheter in place draining orange  urine Extremities: warm and well-perfused. Right arm appears to be edematous- non pitting. No lower extremity swelling  Laboratory: Recent Labs  Lab 12/05/17 0950 12/06/17 0554 12/07/17 0633  WBC 13.3* 14.2* 11.4*  HGB 10.4* 10.8* 10.7*  HCT 34.0* 33.5* 34.5*  PLT 136* 286 285   Recent Labs  Lab 12/13/2017 1800  12/06/17 0554 12/06/17 1841 12/07/17 0633  NA 138   < > 150* 148* 147*  K 4.4   < > 5.2* 4.0 4.0  CL 97*   < > 116* 115* 113*  CO2 26   < > 25 25 25   BUN 20   < > 25* 20 18  CREATININE 1.22*   < > 1.36* 1.14* 1.02*  CALCIUM 10.8*   < > 9.7 9.7 9.6  PROT 7.5  --   --   --  6.0*  BILITOT 1.1  --   --   --  0.7  ALKPHOS 217*  --   --   --  154*  ALT 138*  --   --   --  174*  AST 115*  --   --   --  108*  GLUCOSE 120*   < > 114* 196* 152*   < > = values in this interval not displayed.   Imaging/Diagnostic Tests: Dg Chest 2 View IMPRESSION: No active cardiopulmonary disease.  Dg Elbow Complete Right (3+view)  IMPRESSION: Negative.   Dg Humerus Right  IMPRESSION: No acute fracture.  Degenerative change of the right shoulder.  Mr Jodene Nam Head Wo Contrast IMPRESSION:  1. Question subtle gyriform diffusion abnormality involving the parasagittal left parietooccipital region and possibly left thalamus. Primary consideration would be subtle changes related to seizure. Correlation with history and EEG suggested. Sequelae of mild ischemia would be the primary differential consideration.  2. Moderate atrophy with mild chronic small vessel ischemic disease.   MRA HEAD IMPRESSION: Stable appearance of the intracranial circulation. No large vessel occlusion. No hemodynamically significant or correctable stenosis   Richarda Osmond, DO 12/07/2017, 6:53 AM PGY-1, [place] Intern pager: [phone], text pages welcome    --------------------------------------------------------  FMTS Attending Daily Note:  Annabell Sabal MD  [phone] pager  Family  Practice pager:  [phone] I have seen and examined this patient and have reviewed their chart. I have discussed this patient with the resident. I agree with the resident's findings, assessment and care plan.  Additionally:  Family at bedside today.  I was able to update them on her progress.  There has not been much so far.  She continues to remain sedated on multiple seizure medications.  She is currently on Keppra, Vimpat, Dilantin.  Her electrolytes are slowly improving with half-normal saline. -She has a dry cough which is new.  I did raise the head of her bed.  This persist throughout the day we will obtain a chest x-ray.  Alveda Reasons, MD 12/07/2017 12:54 PM

## 2017-12-07 NOTE — Progress Notes (Signed)
LTM maint complete, no skin break down at [facility] or [facility].

## 2017-12-07 NOTE — Progress Notes (Signed)
Subjective: Opens eyes but does not track and does not follow commands.   Objective: Current vital signs: BP (!) 171/77 (BP Location: Left Arm)   Pulse 94   Temp 98.3 F (36.8 C) (Oral)   Resp 20   Wt 60.2 kg   SpO2 99%   BMI 23.51 kg/m  Vital signs in last 24 hours: Temp:  [97.6 F (36.4 C)-99.3 F (37.4 C)] 98.3 F (36.8 C) (08/25 0738) Pulse Rate:  [72-108] 94 (08/25 0738) Resp:  [16-20] 20 (08/25 0738) BP: (126-171)/(77-103) 171/77 (08/25 0738) SpO2:  [96 %-100 %] 99 % (08/25 0738)  Intake/Output from previous day: 08/24 0701 - 08/25 0700 In: 1875.2 [I.V.:1440.2; IV Piggyback:435] Out: -  Intake/Output this shift: No intake/output data recorded. Nutritional status:  Diet Order            Diet NPO time specified  Diet effective now              HEENT: [place]/AT Lungs: Respirations unlabored  Mental Status: Patient isasleep when neurologist enters the room. Awakens toa state characterized by somnolence and decreased level of alertness after loud verbal and light sternal rub. She continues to have a dense expressive and receptive aphasia. Does not attempt to answer questions and does not follow any commands.  Cranial Nerves:Eyes are conjugate at the midline with some movement and no nystagmus. No definite facial asymmetry.Right side of neck continues to be without twitching Motor:No RUE twitching seen or palpable. RUE with decreased tone and without movement to any stimuli, but tone somewhat increased/improved relative to yesterday, at which time the RUE was completely flaccid. Some movement of LUE to stimulation. BLE withdraw to plantar stimulation equally.   Lab Results: Results for orders placed or performed during the hospital encounter of 12/10/2017 (from the past 48 hour(s))  Basic metabolic panel     Status: Abnormal   Collection Time: 12/06/17  5:54 AM  Result Value Ref Range   Sodium 150 (H) 135 - 145 mmol/L   Potassium 5.2 (H) 3.5 - 5.1 mmol/L   Chloride 116 (H) 98 - 111 mmol/L   CO2 25 22 - 32 mmol/L   Glucose, Bld 114 (H) 70 - 99 mg/dL   BUN 25 (H) 8 - 23 mg/dL   Creatinine, Ser 1.36 (H) 0.44 - 1.00 mg/dL   Calcium 9.7 8.9 - 10.3 mg/dL   GFR calc non Af Amer 36 (L) >60 mL/min   GFR calc Af Amer 42 (L) >60 mL/min    Comment: (NOTE) The eGFR has been calculated using the CKD EPI equation. This calculation has not been validated in all clinical situations. eGFR's persistently <60 mL/min signify possible Chronic Kidney Disease.    Anion gap 9 5 - 15    Comment: Performed at [facility] [address], [place], [place] [address]  CBC     Status: Abnormal   Collection Time: 12/06/17  5:54 AM  Result Value Ref Range   WBC 14.2 (H) 4.0 - 10.5 K/uL   RBC 4.22 3.87 - 5.11 MIL/uL   Hemoglobin 10.8 (L) 12.0 - 15.0 g/dL   HCT 33.5 (L) 36.0 - 46.0 %   MCV 79.4 78.0 - 100.0 fL   MCH 25.6 (L) 26.0 - 34.0 pg   MCHC 32.2 30.0 - 36.0 g/dL   RDW 18.6 (H) 11.5 - 15.5 %   Platelets 286 150 - 400 K/uL    Comment: CONSISTENT WITH PREVIOUS RESULT Performed at [facility], [address] [place], [place] [address]   Phenytoin level, total     Status: None   Collection Time: 12/06/17  2:57 PM  Result Value Ref Range   Phenytoin Lvl 13.8 10.0 - 20.0 ug/mL    Comment: Performed at [facility] [address] [place], [place] [address]  Basic metabolic panel     Status: Abnormal   Collection Time: 12/06/17  6:41 PM  Result Value Ref Range   Sodium 148 (H) 135 - 145 mmol/L   Potassium 4.0 3.5 - 5.1 mmol/L   Chloride 115 (H) 98 - 111 mmol/L   CO2 25 22 - 32 mmol/L   Glucose, Bld 196 (H) 70 - 99 mg/dL   BUN 20 8 - 23 mg/dL   Creatinine, Ser 1.14 (H) 0.44 - 1.00 mg/dL   Calcium 9.7 8.9 - 10.3 mg/dL   GFR calc non Af Amer 45 (L) >60 mL/min   GFR calc Af Amer 52 (L) >60 mL/min    Comment: (NOTE) The eGFR has been calculated using the CKD EPI equation. This calculation has not been validated in all clinical  situations. eGFR's persistently <60 mL/min signify possible Chronic Kidney Disease.    Anion gap 8 5 - 15    Comment: Performed at [facility] [address] [place], [place] [address]  Basic metabolic panel     Status: Abnormal   Collection Time: 12/07/17  6:33 AM  Result Value Ref Range   Sodium 147 (H) 135 - 145 mmol/L   Potassium 4.0 3.5 - 5.1 mmol/L   Chloride 113 (H) 98 - 111 mmol/L   CO2 25 22 - 32 mmol/L   Glucose, Bld 152 (H) 70 - 99 mg/dL   BUN 18 8 - 23 mg/dL   Creatinine, Ser 1.02 (H) 0.44 - 1.00 mg/dL   Calcium 9.6 8.9 - 10.3 mg/dL   GFR calc non Af Amer 51 (L) >60 mL/min   GFR calc Af Amer 59 (L) >60 mL/min    Comment: (NOTE) The eGFR has been calculated using the CKD EPI equation. This calculation has not been validated in all clinical situations. eGFR's persistently <60 mL/min signify possible Chronic Kidney Disease.    Anion gap 9 5 - 15    Comment: Performed at [facility] [address], [place], [place] [address]  CBC with Differential/Platelet     Status: Abnormal   Collection Time: 12/07/17  6:33 AM  Result Value Ref Range   WBC 11.4 (H) 4.0 - 10.5 K/uL   RBC 4.25 3.87 - 5.11 MIL/uL   Hemoglobin 10.7 (L) 12.0 - 15.0 g/dL   HCT 34.5 (L) 36.0 - 46.0 %   MCV 81.2 78.0 - 100.0 fL   MCH 25.2 (L) 26.0 - 34.0 pg   MCHC 31.0 30.0 - 36.0 g/dL   RDW 18.9 (H) 11.5 - 15.5 %   Platelets 285 150 - 400 K/uL   Neutrophils Relative % 75 %   Neutro Abs 8.6 (H) 1.7 - 7.7 K/uL   Lymphocytes Relative 17 %   Lymphs Abs 2.0 0.7 - 4.0 K/uL   Monocytes Relative 6 %   Monocytes Absolute 0.7 0.1 - 1.0 K/uL   Eosinophils Relative 1 %   Eosinophils Absolute 0.1 0.0 - 0.7 K/uL   Basophils Relative 0 %   Basophils Absolute 0.0 0.0 - 0.1 K/uL   Immature Granulocytes 1 %   Abs Immature Granulocytes 0.1 0.0 - 0.1 K/uL  Comment: Performed at [facility], [place] [address] [place], [place] [address]  Hepatic function panel     Status: Abnormal    Collection Time: 12/07/17  6:33 AM  Result Value Ref Range   Total Protein 6.0 (L) 6.5 - 8.1 g/dL   Albumin 2.5 (L) 3.5 - 5.0 g/dL   AST 108 (H) 15 - 41 U/L   ALT 174 (H) 0 - 44 U/L   Alkaline Phosphatase 154 (H) 38 - 126 U/L   Total Bilirubin 0.7 0.3 - 1.2 mg/dL   Bilirubin, Direct 0.3 (H) 0.0 - 0.2 mg/dL   Indirect Bilirubin 0.4 0.3 - 0.9 mg/dL    Comment: Performed at [facility] [address], [place], [place] [address]    Recent Results (from the past 240 hour(s))  Culture, Urine     Status: None   Collection Time: 12/03/17  1:50 PM  Result Value Ref Range Status   Specimen Description URINE, CATHETERIZED  Final   Special Requests NONE  Final   Culture   Final    NO GROWTH Performed at [facility], [address], [place], [place] [address]    Report Status 12/04/2017 FINAL  Final    Lipid Panel No results for input(s): CHOL, TRIG, HDL, CHOLHDL, VLDL, LDLCALC in the last 72 hours.  Studies/Results: No results found.  Medications:  Scheduled: . aspirin EC  81 mg Oral Daily  . diltiazem  60 mg Oral Q8H  . heparin  5,000 Units Subcutaneous Q8H  . pantoprazole sodium  40 mg Per Tube Daily  . phenytoin (DILANTIN) IV  100 mg Intravenous Q8H   Continuous: . dextrose 5 % and 0.45% NaCl 100 mL/hr at 12/07/17 0946  . lacosamide (VIMPAT) IV 200 mg (12/07/17 0947)  . levETIRAcetam Stopped (12/06/17 2306)    LTM EEG report: 12/06/2017 07 30 am to 12/07/2017 07 30 am.  Day of study: 3 Day 3: No clinical or  subclinical seizures present.  Background activities marked by slowing and disorganization unchanged from previously recorded.  Superimposed multifocal sharp waves and spikes present throughout the recording however less prominent and not as frequent as during previous days of the recording. Clinical interpretation: This is day 3 of intensive EEG monitoring with simultaneous video monitoring did not record any clinical subclinical seizures.  Background  activities abnormal for 2 reasons.  #1 background activity slowing and disorganization suggestive of encephalopathy of nonspecific etiologies.  #2 multifocal sharp waves and spikes present throughout the recording most prominent across left hemisphere left parietal and posterior temporal cortex.  In addition occasionally right parietal sharp waves present suggestive of cortical irritability involving both hemispheres but more significantly across left hemisphere.    Assessment:77 year old female with epilepsy who presented with breakthrough seizuresoff valproic acid. Started on Keppra in-house. Initial EEG was normal, then developed partial complex status epilepticus on 8/22 with continuous right sided twitching and left hemisphere EEG correlate. Semiology best localizable as a left sided seizure focus, which correlated with recentMRI brainfindings ofsubtle gyriform diffusion abnormality involving the parasagittal left parietooccipital region and possibly left thalamus. 1. Today, 8/25, RUE continues to exhibit flaccid weakness, but semirhythmic delta activity seen on bedside EEG two days ago, as well as the subtle intermittent right neck, shoulder and arm twitching continue to be resolved. Tone of RUE also slightly improved since yesterday. Resolution of seizure activity on EEG and on exam most likely secondary to the addition of Dilantin to her regimen  on Friday.  2. RUE exhibits flaccid weakness with improving tone since yesterday, consistent with a recovering Todd's paralysis.   3. Dilantin level therapeutic at 13.8   Recommendations: 1.ContinueKeppra at 1532m BID. 2.ContinueVimpat at200 mg BID. 3.Continue Dilantin at 100 mg IV TID. She seems to be improving after addition of this medication. 4. Continue LTM EEG. Ifseizure activity recurs onEEG then we willneed to considerintubationand initiation ofburst suppression protocol, versus addition of phenobarbital, which would likely  increase her sedation. There is concern that given her age and relative frailty, she may not be extubatable if she is intubated for burst suppression. It is for this reason that a regimen for seizure cessation that does not require intubation is being pursued.  5. Gentle IV hydration.    LOS: 6 days   _0  signed: Dr. EKerney Elbe8/25/2019  11:17 AM

## 2017-12-07 NOTE — Procedures (Signed)
Electroencephalogram report- LTM  Ordering Physician : Cheral Marker, MD    Beginning date and time: 12/05/2017 07 30 am  Ending date and time: 12/06/2017 07 30 am   Day of study: 2 Cpt 95951  Medications include: Depakote, Keppra, Vimpat  HISTORY: This 24 hours of intensive EEG monitoring with simultaneous video monitoring was performed for this patient with right-sided twitching and altered mental status.  This EEG was requested to rule out subclinical electrographic seizures.  Data acquisition:  The study consists of a continuous 16-channel multi-montage digital video EEG recording with twenty-one electrodes placed according to the International 10-20 System. Additional leads included eye leads, true temporal leads (T1, T2), and an EKG lead. Activation procedures were not done due to mental status.  Background activities marked by predominantly theta slowing ranging between 5 to 7 cps distributed broadly without well-defined posterior dominance.  Superimposed to very frequent left hemispheric sharp waves and spikes present maximum negativity mostly at C3 P3 negative field extending to posterior temporal and occipital cortex at times.  Also independent in the frequent left temporal spikes present.  In addition there is a right parietal sharp waves present however not as frequently as interictal epileptiform discharges across left hemisphere.  No clinical subclinical seizures present.  Clinical interpretation: This is day 2 of intensive EEG monitoring with simultaneous video monitoring did not record any clinical subclinical seizures.  Background activities abnormal for 2 reasons.  #1 background activity slowing and disorganization suggestive of encephalopathy of nonspecific etiologies.  #2 multifocal sharp waves and spikes present throughout the recording most prominent across left hemisphere left parietal and posterior temporal cortex.  In addition occasionally right parietal sharp waves present  suggestive of cortical irritability involving both hemispheres but more significantly across left hemisphere.  Clinical correlation is advised.

## 2017-12-07 NOTE — Plan of Care (Signed)
  Problem: Education: Goal: Knowledge of General Education information will improve Description Including pain rating scale, medication(s)/side effects and non-pharmacologic comfort measures Outcome: Progressing   Problem: Health Behavior/Discharge Planning: Goal: Ability to manage health-related needs will improve Outcome: Progressing   Problem: Clinical Measurements: Goal: Ability to maintain clinical measurements within normal limits will improve Outcome: Progressing Goal: Will remain free from infection Outcome: Progressing Goal: Diagnostic test results will improve Outcome: Progressing Goal: Respiratory complications will improve Outcome: Progressing Goal: Cardiovascular complication will be avoided Outcome: Progressing   Problem: Activity: Goal: Risk for activity intolerance will decrease Outcome: Progressing   Problem: Nutrition: Goal: Adequate nutrition will be maintained Outcome: Progressing   Problem: Pain Managment: Goal: General experience of comfort will improve Outcome: Progressing   Problem: Safety: Goal: Ability to remain free from injury will improve Outcome: Progressing   Problem: Skin Integrity: Goal: Risk for impaired skin integrity will decrease Outcome: Progressing   Problem: Education: Goal: Knowledge of disease or condition will improve Outcome: Progressing Goal: Knowledge of secondary prevention will improve Outcome: Progressing Goal: Knowledge of patient specific risk factors addressed and post discharge goals established will improve Outcome: Progressing Goal: Individualized Educational Video(s) Outcome: Progressing   Problem: Health Behavior/Discharge Planning: Goal: Ability to manage health-related needs will improve Outcome: Progressing   Problem: Self-Care: Goal: Ability to participate in self-care as condition permits will improve Outcome: Progressing Goal: Verbalization of feelings and concerns over difficulty with self-care  will improve Outcome: Progressing Goal: Ability to communicate needs accurately will improve Outcome: Progressing   Problem: Nutrition: Goal: Risk of aspiration will decrease Outcome: Progressing Goal: Dietary intake will improve Outcome: Progressing   Problem: Ischemic Stroke/TIA Tissue Perfusion: Goal: Complications of ischemic stroke/TIA will be minimized Outcome: Progressing   Problem: Education: Goal: Expressions of having a comfortable level of knowledge regarding the disease process will increase Outcome: Progressing   Problem: Coping: Goal: Ability to adjust to condition or change in health will improve Outcome: Progressing Goal: Ability to identify appropriate support needs will improve Outcome: Progressing   Problem: Health Behavior/Discharge Planning: Goal: Compliance with prescribed medication regimen will improve Outcome: Progressing   Problem: Medication: Goal: Risk for medication side effects will decrease Outcome: Progressing   Problem: Clinical Measurements: Goal: Complications related to the disease process, condition or treatment will be avoided or minimized Outcome: Progressing Goal: Diagnostic test results will improve Outcome: Progressing   Problem: Safety: Goal: Verbalization of understanding the information provided will improve Outcome: Progressing   Problem: Self-Concept: Goal: Level of anxiety will decrease Outcome: Progressing Goal: Ability to verbalize feelings about condition will improve Outcome: Progressing

## 2017-12-08 DIAGNOSIS — C228 Malignant neoplasm of liver, primary, unspecified as to type: Secondary | ICD-10-CM

## 2017-12-08 DIAGNOSIS — Z66 Do not resuscitate: Secondary | ICD-10-CM

## 2017-12-08 LAB — COMPREHENSIVE METABOLIC PANEL
ALBUMIN: 2.5 g/dL — AB (ref 3.5–5.0)
ALT: 196 U/L — ABNORMAL HIGH (ref 0–44)
ANION GAP: 10 (ref 5–15)
AST: 135 U/L — AB (ref 15–41)
Alkaline Phosphatase: 159 U/L — ABNORMAL HIGH (ref 38–126)
BUN: 19 mg/dL (ref 8–23)
CO2: 24 mmol/L (ref 22–32)
Calcium: 9.5 mg/dL (ref 8.9–10.3)
Chloride: 112 mmol/L — ABNORMAL HIGH (ref 98–111)
Creatinine, Ser: 1.05 mg/dL — ABNORMAL HIGH (ref 0.44–1.00)
GFR calc Af Amer: 57 mL/min — ABNORMAL LOW (ref >60)
GFR calc non Af Amer: 50 mL/min — ABNORMAL LOW (ref >60)
GLUCOSE: 138 mg/dL — AB (ref 70–99)
POTASSIUM: 3.9 mmol/L (ref 3.5–5.1)
SODIUM: 146 mmol/L — AB (ref 135–145)
Total Bilirubin: 0.8 mg/dL (ref 0.3–1.2)
Total Protein: 5.9 g/dL — ABNORMAL LOW (ref 6.5–8.1)

## 2017-12-08 LAB — CBC
HEMATOCRIT: 34.3 % — AB (ref 36.0–46.0)
HEMOGLOBIN: 10.8 g/dL — AB (ref 12.0–15.0)
MCH: 25.3 pg — AB (ref 26.0–34.0)
MCHC: 31.5 g/dL (ref 30.0–36.0)
MCV: 80.3 fL (ref 78.0–100.0)
Platelets: 244 10*3/uL (ref 150–400)
RBC: 4.27 MIL/uL (ref 3.87–5.11)
RDW: 18.7 % — ABNORMAL HIGH (ref 11.5–15.5)
WBC: 8.6 10*3/uL (ref 4.0–10.5)

## 2017-12-08 LAB — AMMONIA: Ammonia: 38 umol/L — ABNORMAL HIGH (ref 9–35)

## 2017-12-08 LAB — PHENYTOIN LEVEL, TOTAL: Phenytoin Lvl: 16.8 ug/mL (ref 10.0–20.0)

## 2017-12-08 MED ORDER — MIDAZOLAM HCL 2 MG/2ML IJ SOLN: 5.0000 mg | Freq: Once | INTRAMUSCULAR | Status: DC

## 2017-12-08 MED ORDER — MIDAZOLAM BOLUS VIA INFUSION: 5.0000 mg | Freq: Once | INTRAVENOUS | Status: DC

## 2017-12-08 MED ORDER — ORAL CARE MOUTH RINSE
15.0000 mL | Freq: Two times a day (BID) | OROMUCOSAL | Status: DC
  Administered 2017-12-08 – 2017-12-21 (×27): 15 mL via OROMUCOSAL

## 2017-12-08 NOTE — Progress Notes (Signed)
Family Medicine Teaching Service Daily Progress Note Intern Pager: [phone]  Patient name: [name] Medical record number: [id number] Date of birth: 1941/01/30 Age: 77 y.o. Gender: female  Primary Care Provider: Everrett Coombe, MD Consultants: Neuro, palliative, psychiatry Code Status: Full Code  Pt Overview and Major Events to Date:  8/19 admitted for AMS, found down up to 24 hours 8/22 given ativan and Phenytoin for clinical seizure activity noted around 11 AM  Assessment and Plan: [name] T Reavesis a 77 y.o.femalepresenting with AMS and being found down with last known normal on Sunday 2:30pm likely due to seizure at home. PMH is significant forHCC, Schizoaffective Disorder,HxSeizure, Chronic D CHF, HLD, HTN, CKD III, GERD.   Seizures - Neurology following. 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 She is being treated with Vimpat, Keppra, Dilantin.  Per neurology notes no seizures on EEG from 8/25-8/26.  Somnolence due to seizures vs sedative effects from seizure medications.  Current plan is to continue monitoring to see if her mental status begins to improve.  - Neurology following, appreciate recs - continuous EEG - monitor on telemetry - continue vimpat, keppra and dilantin pending neuro recs - seizure precautions - NPO pending improvement in mentation - appreciate neuro recs  AMS -awake this AM, blinking. Remains aphasic and does not follow commands. AMS thought to be due to seizure activity/post-ictal/multiple sedating meds for seizure.  -  Seizure management as above - continue NPO status - family declines NG tube for nutrition, feels this would not be the patient's wish - D5NS IVF while altered/NPO  Schizoeffective/Bipolar disorder - previously well-controlled on depakote and risperdol, however depakote had been titrated down due to elevated LFTs which were most likely abnormal due to [facility]. Unclear if current AMS is new baseline. - holding home  Risperdal for ongoing AMS - plan to re-consult psych for additional assistance in med management if patient becomes able to participate in the interview  Fall at Home, unwitnessed. Seems most likely due to seizure. Patient underwent trauma workup including R tib/fib XR, R Humerus and elbow XR, CXR which were all negative for bony abnormalities.  - avoid sedating pain medications d/t AMS  Leukocytosis, resolved - likely was due to ongoing seizure activity as no seizure activity noted on EEG for the past couple of days. No evidence of infection, no fevers, vitals stable.  - did not check CBC today, has been stable over past few days  Known Hepatocellular Carcinoma: Confirmed by Biopsy 2018  ALT/AST slightly increased from yesterday (AST 135, ALT 196). Patient has expressed not wanting treatment. - patient made DNI/DNR during palliative meeting on 8/26  HTN - elevated overnight with max SBP 170-190. She was not given PRN hydral. On home diltiazem 60 mg q8H but hasn't been able to take due to NPO. Can consider adding low dose IV if remains NPO. - home dilt is ordered but she will be unable to take it while NPO - PRN q4H hydral 5mg  IV systolic >333, diastolic >545  HLD No home statin. Has chronically elevated LFTs 2/2 HCC. On ASA 81mg  at home. - cont home ASA 81mg   CKD III Cr 1.22 on admission. Creatinine today is 1.05. Continues with IV fluids. - cont to monitor BMP  GERD Protonix at home. - started protonix in oral suspension  Anemia -  BL between 10-12. Hgb Stable at baseline, monitored daily. Consider anemia workup when patient is not acutely ill, possibly on outpatient side. Hgb 10.8 today  FEN/GI:NPO Prophylaxis:Heparin  Disposition: Continue  inpatient management of seizure activity, palliative meeting today  Subjective:  No acute events overnight. Patient wakes when I enter the room, blinks frequently. Does not follow commands. Mumbles incoherently. Does not indicate that  she is in pain when asked. More alert than I have seen her when checking on her in the evenings.  Objective: Temp:  [97.6 F (36.4 C)-98.5 F (36.9 C)] 98 F (36.7 C) (08/27 0330) Pulse Rate:  [78-88] 82 (08/27 0330) Resp:  [18-20] 20 (08/27 0330) BP: (131-190)/(65-89) 179/74 (08/27 0330) SpO2:  [97 %-100 %] 100 % (08/27 0330)  Physical Exam: General: NAD, rests in bed, opens eyes and blinks Cardiovascular: RRR, no m/r/g Respiratory: CTA bil anteriorly Abdomen: soft, non-distended, no HSM Extremities: no LE edema Neuro: does not follow commands. Pupils equal and reactive.   Laboratory: Recent Labs  Lab 12/06/17 0554 12/07/17 0633 12/08/17 0831  WBC 14.2* 11.4* 8.6  HGB 10.8* 10.7* 10.8*  HCT 33.5* 34.5* 34.3*  PLT 286 285 244   Recent Labs  Lab 12/06/17 1841 12/07/17 0633 12/08/17 0831  NA 148* 147* 146*  K 4.0 4.0 3.9  CL 115* 113* 112*  CO2 25 25 24   BUN 20 18 19   CREATININE 1.14* 1.02* 1.05*  CALCIUM 9.7 9.6 9.5  PROT  --  6.0* 5.9*  BILITOT  --  0.7 0.8  ALKPHOS  --  154* 159*  ALT  --  174* 196*  AST  --  108* 135*  GLUCOSE 196* 152* 138*   Imaging/Diagnostic Tests: No results found.  Everrett Coombe, MD 12/09/2017, 6:08 AM PGY-3, [place] Intern pager: [phone], text pages welcome

## 2017-12-08 NOTE — Progress Notes (Addendum)
Subjective: Patient is currently in bed on LTM.  Initially was sitting up on the side of the bed with PT, OT, ST.  Patient is nonverbal.  Patient was laid back in bed.  Exam: Vitals:   12/08/17 0417 12/08/17 0807  BP: (!) 161/66 (!) 157/65  Pulse: (!) 101 82  Resp: 16 18  Temp: 97.9 F (36.6 C) 98.5 F (36.9 C)  SpO2: 100% 100%    Physical Exam   HEENT-  Normocephalic, no lesions, without obvious abnormality.  Normal external eye and conjunctiva.   Extremities- Warm, dry and intact Musculoskeletal-no joint tenderness, deformity or swelling Skin-warm and dry, no hyperpigmentation, vitiligo, or suspicious lesions    Neuro:  Mental Status: Patient moans with noxious stimuli.  Keeps her eyes closed.  Does not follow any commands.. Cranial Nerves: II: When eyes are held open she does seem to blink to threat bilaterally over her right is greater than her left III,IV, VI: ptosis not present, she does look left to right to voice, pupils 2 mm and reactive V,VII: Face symmetrical , facial light touch sensation normal bilaterally to noxious stimuli VIII: hearing normal bilaterally  Motor: Tested to noxious stimuli bilateral upper extremities.  Lower extremity she does withdrawal to noxious stimuli by bending her leg off the bed but does not lift her heel.  Tone is decreased throughout  Sensory: Response to noxious stimuli throughout Deep Tendon Reflexes: 1+ throughout Plantars: Right: downgoing   Left: downgoing Cerebellar: Unable to examine Gait: Not able to examine     Medications:  Prior to Admission:  Medications Prior to Admission  Medication Sig Dispense Refill Last Dose  . Alum & Mag Hydroxide-Simeth (COMFORT GEL PO) Take 15 mLs by mouth daily as needed (for stomach).    unk  . ammonium lactate (AMLACTIN) 12 % cream APPLY 1 GRAM TO THE AFFECTED AREA AS NEEDED FOR DRY SKIN 280 g 0 unk  . aspirin EC 81 MG tablet Take 1 tablet (81 mg total) by mouth daily. 90 tablet 4 Past  Week at Unknown time  . Calcium Citrate-Vitamin D (CALCIUM CITRATE + D3 PO) Take 1 tablet by mouth 3 (three) times daily.    unk  . diclofenac sodium (VOLTAREN) 1 % GEL Apply 2 g topically 2 (two) times daily as needed (pain).   unk  . diltiazem (CARDIZEM) 60 MG tablet Take 1 tablet (60 mg total) by mouth 3 (three) times daily. 270 tablet 2 Past Week at Unknown time  . divalproex (DEPAKOTE ER) 250 MG 24 hr tablet TAKE 1 TABLET(250 MG) BY MOUTH DAILY (Patient taking differently: Take 750 mg by mouth daily. ) 90 tablet 0 Past Week at Unknown time  . Multiple Vitamins-Minerals (CENTRUM SILVER ADULT 50+ PO) Take 1 tablet by mouth daily.   unk  . nitroGLYCERIN (NITROSTAT) 0.4 MG SL tablet PLACE 1 TABLET UNDER THE THE TONGUE AS DIRECTED EVERY 5 MINUTES AS NEEDED FOR CHEST PAIN (Patient taking differently: Place 0.4 mg under the tongue every 5 (five) minutes as needed for chest pain. ) 25 tablet 5 unk  . pantoprazole (PROTONIX) 40 MG tablet Take 1 tablet (40 mg total) by mouth daily. 90 tablet 3 Past Week at Unknown time  . polyethylene glycol powder (GLYCOLAX/MIRALAX) powder Take 17 g by mouth daily. 500 g 0 Past Week at Unknown time  . Polyvinyl Alcohol-Povidone (REFRESH OP) Place 1-2 drops into both eyes at bedtime as needed (dry eyes).    unk  . risperiDONE (RISPERDAL) 2 MG  tablet TAKE 1 TABLET(2 MG) BY MOUTH AT BEDTIME (Patient taking differently: Take 2 mg by mouth at bedtime. ) 90 tablet 0 Past Week at Unknown time  . sodium chloride (OCEAN) 0.65 % SOLN nasal spray Place 1 spray into both nostrils 2 (two) times daily. 1 Bottle 0 unk  . vitamin C (ASCORBIC ACID) 500 MG tablet Take 500 mg by mouth daily.   unk  . oxyCODONE (ROXICODONE) 5 MG immediate release tablet Take 0.5 tablets (2.5 mg total) by mouth every 6 (six) hours as needed for severe pain. 10 tablet 0 unk   Scheduled: . ammonium lactate   Topical BID  . aspirin EC  81 mg Oral Daily  . diltiazem  60 mg Oral Q8H  . heparin  5,000 Units  Subcutaneous Q8H  . pantoprazole sodium  40 mg Per Tube Daily  . phenytoin (DILANTIN) IV  100 mg Intravenous Q8H    Pertinent Labs/Diagnostics: LTM reading for today :  This is day 4 of intensive EEG monitoring with simultaneous video monitoring marked by background activity slowing with superimposed multifocal sharp waves and spikes suggestive of encephalopathy and significant cortical irritability.  As recording progressed left hemispheric sharp waves and spikes appear to be more frequent and occur in short continues trains however without evolution.  This finding suggestive of significant cortical irritability which appeared to be more prominently present during second half of the recording.  Clinical correlation is advised.   AST has increased from 1082 to1 35 ALT is increased from 174 to1 96  No results found.   Etta Quill PA-C [facility] [phone]  12/08/2017, 10:08 AM   NEUROHOSPITALIST ADDENDUM I performed face-to-face evaluation of the patient. I have reviewed the contents of history and physical exam as documented by PA/ARNP/Resident and agree with above documentation.  I have discussed and formulated the above plan as documented below.    Assessment: Unfortunate 77 year old female with epilepsy who presented with 3 seizures after being tapered off  valproic acid due to recent liver cancer.  She was started Keppra in-house and initial EEG was normal but then developed partial complex status on 8/22 with continuous right-sided twitching and left hemispheric EEG correlation.  MRI brain finding showed subtle gyriform diffusion abnormality involving the parasagittal left parietal occipital region and possibly left thalamus.  Currently is maxed out on 3 seizure medications.  Continuous EEG from yesterday till today morning did not show seizures.  However she does continue to have left hemispheric sharp waves and spikes.  Clinically she is doing better than  yesterday however remains fairly somnolent.  She does appear to have episodes where she blinks frequently and unresponsive.  Impression  Partial Status epilepticus   Recommendations: Continue current AEDs Vimpat 200 mg twice daily Keppra 1.5 g twice daily Dilantin 100 mg 3 times daily, recent level was 16.8 We will continue to monitor with EEG, if no further seizures will discontinue tomorrow  Discussed with family, she has further seizures -we will not burst suppress.  She is DNR/DNI.     Karena Addison Aroor MD [facility] [phone]   If 7pm to 7am, please call on call as listed on AMION.

## 2017-12-08 NOTE — Progress Notes (Signed)
Paged by nurse for increased twitching of left side.  Neurology already aware that patient has been having seizures for about three days, continuous EEG monitoring is on.  Per meeting today with palliative care, neurology, and family medicine with patient's family, neuro stated that patient is already on three anti-convulsants.  Patient's family does not wish to proceed with intubation and burst suppression, which per neuro would be the next step to try to stop seizure activity.  Patient's family concerned about new twitching on left side as has always been on the right.  Advised nurse that Neurology should be paged as they may have more insight into this.  [place] Constable, D.O.  PGY-1 Family Medicine  12/08/2017 6:36 PM

## 2017-12-08 NOTE — Progress Notes (Signed)
Family Medicine Teaching Service Daily Progress Note Intern Pager: [phone]  Patient name: [name] Medical record number: [id number] Date of birth: 08/21/1940 Age: 77 y.o. Gender: female  Primary Care Provider: Everrett Coombe, MD Consultants: Neuro, palliative, psychiatry Code Status: Full Code  Pt Overview and Major Events to Date:  8/19 admitted for AMS, found down up to 24 hours 8/22 given ativan and Phenytoin for clinical seizure activity noted around 11 AM  Assessment and Plan: [name] T Reavesis a 77 y.o.femalepresenting with AMS and being found down with last known normal on Sunday 2:30pm likely due to seizure at home. PMH is significant forHCC, Schizoaffective Disorder,HxSeizure, Chronic D CHF, HLD, HTN, CKD III, GERD.   Continuous Seizure Activity -  No subclinical seizures noted on continuous EEG monitoring yesterday however with abnormal background activity suggesting encephalopathy and cortical irritability. No antiepileptic dose adjustments yesterday. Patient able to follow some commands this am (opening eyes on command, some tracking with eyes) however continues to be nonverbal with occasional moans.  Possible contributing factors of continued mental status decline include Keppra side effect and sedative effect of Phenytoin as well as possibility of withholding home psych medications. Unclear if this is new baseline given prolonged seizure activity. - Neurology following, appreciate recs - continuous EEG - monitor on telemetry - continue Phenytoin 100mg  IV TID and monitor level - continue keppra 1500 mg q12H per neuro - continue vimpat (lacosamide) 200 mg q12H per neuro - monitor LFTs - seizure precautions -NPO - appreciate neuro recs  AMS - Opens eyes to verbal stimuli with moderate visual tracking. Does not follow other motor commands. Continues to be nonverbal.  AMS most likely due to seizure activity/post-ictal/multiple sedating meds for seizure.  -  Seizure  management as above - continue NPO status, will need to assess need for NG tube for nutrition if continues to be altered. - D5NS IVF while altered/NPO  Schizoeffective/Bipolar disorder - previously well-controlled on depakote and risperdol, however depakote had been titrated down due to elevated LFTs which were most likely abnormal due to [facility]. LFTs slightly uptrending this am, will continue to monitor (AST 135, ALT 196). Unclear if current AMS is new baseline. - holding home Risperdal for ongoing AMS - plan to re-consult psych for additional assistance in med management if patient becomes able to participate in the interview  Fall at Home, unwitnessed. Seems most likely due to seizure. Patient underwent trauma workup including R tib/fib XR, R Humerus and elbow XR, CXR which were all negative for bony abnormalities.  - avoid sedating pain medications d/t AMS  Leukocytosis, resolved - likely was due to ongoing seizure activity as no seizure activity noted on EEG for the past couple of days. No evidence of infection, no fevers, vitals stable.  - continue to monitor  Known Hepatocellular Carcinoma: Confirmed by Biopsy 2018  ALT/AST slightly increased from yesterday (AST 135, ALT 196). Palliative meeting scheduled for today to discuss [place], previously patient has expressed not wanting treatment. - f/u palliative mtg - follow LFTs  HTN - elevated overnight with max SBP 160s. No PRN meds required. On home diltiazem 60 mg q8H but hasn't been able to take due to NPO. Can consider adding low dose IV if continues to require PRN meds. - home dilt is ordered but she will be unable to take it while NPO - PRN q4H hydral 5mg  IV systolic >850, diastolic >277  HLD No home statin. Has chronically elevated LFTs 2/2 HCC. On ASA 81mg  at home. -  cont home ASA 81mg   CKD III Cr 1.22 on admission. Creatinine today is 1.05. Continues with IV fluids. - cont to monitor BMP  GERD Protonix at home. - started  protonix in oral suspension  Anemia -  BL between 10-12. Hgb Stable at baseline, monitored daily. Consider anemia workup when patient is not acutely ill, possibly on outpatient side. Hgb 10.8 today  FEN/GI:NPO Prophylaxis:Heparin  Disposition: Continue inpatient management of seizure activity, palliative meeting today  Subjective:  Patient continues to be nonverbal with limited ability to follow commands.  Objective: Temp:  [97.9 F (36.6 C)-99.1 F (37.3 C)] 98.5 F (36.9 C) (08/26 0807) Pulse Rate:  [82-106] 82 (08/26 0807) Resp:  [16-18] 18 (08/26 0807) BP: (137-178)/(65-94) 157/65 (08/26 0807) SpO2:  [97 %-100 %] 100 % (08/26 0807)  Physical Exam: General: elderly female lying in bed, no apparent distress, moans in response Cardiovascular: RRR, no murmur Respiratory: CTAB anteriorly. No cough noted on exam today. Appropriate saturations on room air. Abdomen: soft, NTND.  Extremities: no LE edema.  Laboratory: Recent Labs  Lab 12/06/17 0554 12/07/17 0633 12/08/17 0831  WBC 14.2* 11.4* 8.6  HGB 10.8* 10.7* 10.8*  HCT 33.5* 34.5* 34.3*  PLT 286 285 244   Recent Labs  Lab 11/17/2017 1800  12/06/17 1841 12/07/17 0633 12/08/17 0831  NA 138   < > 148* 147* 146*  K 4.4   < > 4.0 4.0 3.9  CL 97*   < > 115* 113* 112*  CO2 26   < > 25 25 24   BUN 20   < > 20 18 19   CREATININE 1.22*   < > 1.14* 1.02* 1.05*  CALCIUM 10.8*   < > 9.7 9.6 9.5  PROT 7.5  --   --  6.0* 5.9*  BILITOT 1.1  --   --  0.7 0.8  ALKPHOS 217*  --   --  154* 159*  ALT 138*  --   --  174* 196*  AST 115*  --   --  108* 135*  GLUCOSE 120*   < > 196* 152* 138*   < > = values in this interval not displayed.   Imaging/Diagnostic Tests: No results found.  Rory Percy, DO 12/08/2017, 9:56 AM PGY-2, [place] Intern pager: [phone], text pages welcome

## 2017-12-08 NOTE — Procedures (Signed)
Electroencephalogram report- LTM  Ordering Physician : Cheral Marker, MD    Beginning date and time: 12/07/2017 07 30 am  Ending date and time: 12/08/2017 07 30 am   Day of study: 4 Cpt 95951  Medications include: Depakote, Keppra, Vimpat  HISTORY: This 24 hours of intensive EEG monitoring with simultaneous video monitoring was performed for this patient with right-sided twitching and altered mental status.  This EEG was requested to rule out subclinical electrographic seizures.  Data acquisition:  The study consists of a continuous 16-channel multi-montage digital video EEG recording with twenty-one electrodes placed according to the International 10-20 System. Additional leads included eye leads, true temporal leads (T1, T2), and an EKG lead. Activation procedures were not done due to mental status.  Day 2: Background activities marked by predominantly theta slowing ranging between 5 to 7 cps distributed broadly without well-defined posterior dominance.  Superimposed to very frequent left hemispheric sharp waves and spikes present maximum negativity mostly at C3 P3 negative field extending to posterior temporal and occipital cortex at times.  Also independent in the frequent left temporal spikes present.  In addition there is a right parietal sharp waves present however not as frequently as interictal epileptiform discharges across left hemisphere.  No clinical subclinical seizures present.  Day 3: No clinical or  subclinical seizures present.  Background activities marked by slowing and disorganization unchanged from previously recorded.  Superimposed multifocal sharp waves and spikes present throughout the recording however less prominent and not as frequent as during previous days of the recording.  Day 4: Background activities marked by continues background activity slowing and disorganization.  Superimposed multifocal sharp waves present independently involving the left and right hemisphere.   Left hemispheric sharp waves and spikes appear to be more frequent as recording progress.  During second half of the recording a left hemispheric spike and wave discharges appear in short rhythmic trains however without evolution to suggest discrete electrographic seizures  Clinical interpretation: This is day 4 of intensive EEG monitoring with simultaneous video monitoring marked by background activity slowing with superimposed multifocal sharp waves and spikes suggestive of encephalopathy and significant cortical irritability.  As recording progressed left hemispheric sharp waves and spikes appear to be more frequent and occur in short continues trains however without evolution.  This finding suggestive of significant cortical irritability which appeared to be more prominently present during second half of the recording.  Clinical correlation is advised.

## 2017-12-08 NOTE — Progress Notes (Addendum)
Family medicine notified that patient is having worse twitching on the left side. Dr. Lorraine Lax paged but awaiting call back.

## 2017-12-08 NOTE — Progress Notes (Signed)
LTM EEG checked. No skin breakdown seen on frontal leads

## 2017-12-08 NOTE — Progress Notes (Signed)
Patient ID: [name], female   DOB: Jan 13, 1941, 77 y.o.   MRN: [id number]  This NP visited patient at the bedside as scheduled  to last week's  GOCs meeting.   Continued conversation regarding diagnoses, prognosis, GOCss, EOL wishes, disposition and options.    Family have in hand a copy of the patient's Declaration of a Desire for a Natural Death.  They are aware of the liver adenocarcinoma, confirmed by biopsy in February 2018.   Patient was very adamant about no further testing or treatment.   Family will  honor that wish.  Coordinated meeting with neurology and family medicine.  Family appreciates all medical  input as they try to make important decisions regarding treatment plan.    Family face decisions related to current situation of ongoing seizure activity.      Family is firm in decision for no intubation under any circumstance.          - placed a DNR/DNI order Discussion had regarding temporary feeding source, ie cor-trak.  Family needs time to contemplate this decision.   After a detailed discussion today with the  two daughters and brother, I feel that they are very much leaning toward a comfort approach to care.  This was a women who very much valued her independence and dignity.  She did not want investigation into her recent diagnosis of liver cancer.  It is most important to this family that they honor the patient's wishes.  If it is not realistic that ongoing medical interventions will return the patient to her baseline family will opt for a comfort path.  We discussed the possibility of residential hospice.  Discussed with family the importance of continued conversation with the  medical providers regarding overall plan of care and treatment options,  ensuring decisions are within the context of the patients values and GOCs.  Questions and concerns addressed to the best of my ability     Discussed with Dr Lorraine Lax, and Family Medicine team  Total time spent on the unit  was 60 minutes  Greater than 50% of the time was spent in counseling and coordination of care  Wadie Lessen NP  Palliative Medicine Team Team Phone # [phone] Pager [phone]

## 2017-12-08 NOTE — Progress Notes (Signed)
  Speech Language Pathology Treatment: Dysphagia;Cognitive-Linquistic  Patient Details Name: [name] MRN: [id number] DOB: September 18, 1940 Today's Date: 12/08/2017 Time: 1497-0263 SLP Time Calculation (min) (ACUTE ONLY): 8 min  Assessment / Plan / Recommendation Clinical Impression  Pt seen sitting edge of bed with assist from PT. Pt minimally arousable despite max interventions. Eyes occasionally open, partially deviated mid-left. Did not follow commands despite max verbal tactile cues in a functional task. Pt did have an automatic, appropriate response to sensation of ice via spoon and water via straw, though coughing occurred with sips of water. Given lethargy and need for total assist feeding pt is not appropriate for PO diet. RN ok to provide meds crushed in puree if pt upright and responsive to spoon. Will follow for further trials.   HPI HPI: 77 y.o. female admitted from home by family with acute MS changes and L sided weakness - MRI consistent with seizure per most recent neuro notes       SLP Plan  Continue with current plan of care       Recommendations  Diet recommendations: NPO Medication Administration: Crushed with puree                Oral Care Recommendations: Oral care QID Follow up Recommendations: Skilled Nursing facility SLP Visit Diagnosis: Cognitive communication deficit (Z85.885) Plan: Continue with current plan of care       [place] Reinhardt, MA CCC-SLP [phone]  [name] 12/08/2017, 10:24 AM

## 2017-12-08 NOTE — Progress Notes (Signed)
Physical Therapy Treatment Patient Details Name: [name] MRN: [id number] DOB: March 09, 1941 Today's Date: 12/08/2017    History of Present Illness 77 y.o. female presenting with AMS and being found down, with right sided weakness. PMH is significant for HCC, bipolar 1 disorder, Schizoaffective Disorder, Hx Seizure, Chronic D CHF, HLD, HTN, CKD III, GERD. MRI revealed Question subtle gyriform diffusion abnormality involving the parasagittal left parietooccipital region and possibly left thalamus with Primary consideration would be subtle changes related to seizure. No large vessel occlusion.    PT Comments    Patient seen for therapeutic intervention and assessment. At this time, patient significantly lethargic throughout session despite EOB attempts. Patient able to follow commands x2 during session with some visual tracking and extremely brief moments of arousal. Unable to sustain. Patient with non intelligible verbalizations throughout. Patient with some withdraw to pain noted 3/4 extremities. ROM performed and patient repositioned for protective positioning at end of session. Will continue to see and progress as tolerated.   Follow Up Recommendations  SNF;Supervision/Assistance - 24 hour     Equipment Recommendations  (TBD)    Recommendations for Other Services       Precautions / Restrictions Precautions Precautions: Fall Precaution Comments: seizure precautions  Restrictions Weight Bearing Restrictions: No    Mobility  Bed Mobility Overal bed mobility: Needs Assistance Bed Mobility: Rolling;Supine to Sit;Sit to Supine Rolling: Total assist;+2 for physical assistance;+2 for safety/equipment   Supine to sit: Total assist;+2 for physical assistance;+2 for safety/equipment Sit to supine: Total assist;+2 for physical assistance;+2 for safety/equipment   General bed mobility comments: Patient complete total assist this session (question medication induced lethargy vs  ability)  Transfers                 General transfer comment: unsafe to attempt  Ambulation/Gait                 Stairs             Wheelchair Mobility    Modified Rankin (Stroke Patients Only) Modified Rankin (Stroke Patients Only) Pre-Morbid Rankin Score: No symptoms Modified Rankin: Severe disability     Balance Overall balance assessment: Needs assistance Sitting-balance support: Single extremity supported;Feet supported Sitting balance-Leahy Scale: Zero Sitting balance - Comments: total assist for EOb activity, was able to hold head position in neutral, no evidence of trunk control activation(EOB ~8 minutes) Postural control: Posterior lean                                  Cognition Arousal/Alertness: Lethargic Behavior During Therapy: Flat affect Overall Cognitive Status: No family/caregiver present to determine baseline cognitive functioning Area of Impairment: Following commands;Awareness                       Following Commands: (followed commands x2 during session otherwise not following)   Awareness: Intellectual   General Comments: pt following one step commands approx 50% of the time; intermittently responding to questions, often with "yes"; pt does smile appropriately with therapists when attempting to engage pt in conversation; of note pt does tend to scream out upon initial waking suspect due to unfamiliar environment       Exercises      General Comments        Pertinent Vitals/Pain Faces Pain Scale: No hurt    Home Living  Prior Function            PT Goals (current goals can now be found in the care plan section) Acute Rehab PT Goals Patient Stated Goal: none stated  PT Goal Formulation: Patient unable to participate in goal setting Time For Goal Achievement: 12/16/17 Potential to Achieve Goals: Fair Progress towards PT goals: Not progressing toward goals -  comment    Frequency    Min 3X/week      PT Plan Current plan remains appropriate    Co-evaluation PT/OT/SLP Co-Evaluation/Treatment: Yes Reason for Co-Treatment: For patient/therapist safety PT goals addressed during session: Mobility/safety with mobility        AM-PAC PT "6 Clicks" Daily Activity  Outcome Measure  Difficulty turning over in bed (including adjusting bedclothes, sheets and blankets)?: Unable Difficulty moving from lying on back to sitting on the side of the bed? : Unable Difficulty sitting down on and standing up from a chair with arms (e.g., wheelchair, bedside commode, etc,.)?: Unable Help needed moving to and from a bed to chair (including a wheelchair)?: Total Help needed walking in hospital room?: Total Help needed climbing 3-5 steps with a railing? : Total 6 Click Score: 6    End of Session   Activity Tolerance: Patient limited by fatigue Patient left: in bed;with call bell/phone within reach;with bed alarm set Nurse Communication: Mobility status PT Visit Diagnosis: Hemiplegia and hemiparesis;Other symptoms and signs involving the nervous system (R29.898) Hemiplegia - Right/Left: Left Hemiplegia - caused by: Cerebral infarction     Time: [id number] PT Time Calculation (min) (ACUTE ONLY): 18 min  Charges:  $Therapeutic Activity: 8-22 mins                     Alben Deeds, PT DPT  Board Certified Neurologic Specialist [place] 12/08/2017, 10:32 AM

## 2017-12-09 ENCOUNTER — Inpatient Hospital Stay (HOSPITAL_COMMUNITY): Payer: Medicare Other

## 2017-12-09 MED ORDER — TOPIRAMATE 100 MG PO TABS
200.0000 mg | ORAL_TABLET | Freq: Two times a day (BID) | ORAL | Status: DC
  Administered 2017-12-09: 200 mg via ORAL
  Filled 2017-12-09: qty 2

## 2017-12-09 MED ORDER — OSMOLITE 1.2 CAL PO LIQD
1000.0000 mL | ORAL | Status: DC
  Administered 2017-12-09: 1000 mL
  Filled 2017-12-09: qty 1000

## 2017-12-09 MED ORDER — IOPAMIDOL (ISOVUE-300) INJECTION 61%
50.0000 mL | Freq: Once | INTRAVENOUS | Status: AC | PRN
  Administered 2017-12-09: 10 mL

## 2017-12-09 MED ORDER — LIDOCAINE VISCOUS HCL 2 % MT SOLN
15.0000 mL | Freq: Once | OROMUCOSAL | Status: DC
  Filled 2017-12-09: qty 15

## 2017-12-09 MED ORDER — TOPIRAMATE 100 MG PO TABS
200.0000 mg | ORAL_TABLET | Freq: Two times a day (BID) | ORAL | Status: DC
  Administered 2017-12-10 – 2017-12-12 (×5): 200 mg
  Filled 2017-12-09 (×5): qty 2

## 2017-12-09 MED ORDER — DILTIAZEM 12 MG/ML ORAL SUSPENSION
60.0000 mg | Freq: Three times a day (TID) | ORAL | Status: DC
  Administered 2017-12-10 – 2017-12-12 (×7): 60 mg
  Filled 2017-12-09 (×8): qty 6

## 2017-12-09 MED ORDER — IOPAMIDOL (ISOVUE-300) INJECTION 61%
INTRAVENOUS | Status: AC
  Filled 2017-12-09: qty 50

## 2017-12-09 MED ORDER — LIDOCAINE VISCOUS HCL 2 % MT SOLN
OROMUCOSAL | Status: AC
  Filled 2017-12-09: qty 15

## 2017-12-09 NOTE — Progress Notes (Signed)
Pt arrived from cortrak placement. Will give topamax per tube per MD order. Pharmacy stated that topamax should be given now and to hold pm dose

## 2017-12-09 NOTE — Progress Notes (Addendum)
Reason for consult: Seizures   Subjective: patient has multiple episodes of rhythmic eye fluttering.    ROS: Unable to obtain due to poor mental status  Examination  Vital signs in last 24 hours: Temp:  [97.7 F (36.5 C)-98.2 F (36.8 C)] 98.2 F (36.8 C) (08/27 1216) Pulse Rate:  [69-88] 86 (08/27 1216) Resp:  [18-20] 18 (08/27 1216) BP: (157-190)/(61-89) 159/67 (08/27 1216) SpO2:  [98 %-100 %] 100 % (08/27 1216)  General: Not in distress, cooperative CVS: pulse-normal rate and rhythm RS: breathing comfortably Extremities: normal   Neuro: MS: Awake, mumbles and repeats words, follows some basic commands CN: pupils equal and reactive,  EOMI, face symmetric Motor: 4/5 strength in LUE and LLE, 2/5 strength over RUE and RLE Coordination: no obvious ataxia Gait: not tested  Basic Metabolic Panel: Recent Labs  Lab 12/05/17 0442 12/06/17 0554 12/06/17 1841 12/07/17 0633 12/08/17 0831  NA 146* 150* 148* 147* 146*  K 4.0 5.2* 4.0 4.0 3.9  CL 114* 116* 115* 113* 112*  CO2 27 25 25 25 24   GLUCOSE 103* 114* 196* 152* 138*  BUN 22 25* 20 18 19   CREATININE 1.07* 1.36* 1.14* 1.02* 1.05*  CALCIUM 9.3 9.7 9.7 9.6 9.5    CBC: Recent Labs  Lab 12/05/17 0442 12/05/17 0950 12/06/17 0554 12/07/17 0633 12/08/17 0831  WBC 14.0* 13.3* 14.2* 11.4* 8.6  NEUTROABS  --   --   --  8.6*  --   HGB 10.6* 10.4* 10.8* 10.7* 10.8*  HCT 33.2* 34.0* 33.5* 34.5* 34.3*  MCV 80.2 83.5 79.4 81.2 80.3  PLT 279 136* 286 285 244     Coagulation Studies: No results for input(s): LABPROT, INR in the last 72 hours.  This is day 5 of intensive EEG monitoring with simultaneous video monitoring marked by background activity slowing with superimposed multifocal sharp waves and spikes suggestive of encephalopathy and significant cortical irritability.  Hemispheric spike and wave discharges as discussed above were present more prominently throughout the recording.  Brief electrographic seizures  cannot be completely ruled out.  Episode accompanied by frequent eye blinking and chewing with concomitant likely ictal EEG could represent clinical and electrographic seizure as discussed above.  Clinical correlation is advised    ASSESSMENT AND PLAN  77 year old female with epilepsy who presented with 3 seizures after being tapered off  valproic acid due to recent liver cancer.  She was started Keppra in-house and initial EEG was normal but then developed partial complex status on 8/22 with continuous right-sided twitching and left hemispheric EEG correlation.  MRI brain finding showed subtle gyriform diffusion abnormality involving the parasagittal left parietal occipital region and possibly left thalamus.  Currently is maxed out on 3 seizure medications.  Continuous EEG from yesterday till today morning did not show seizures.  However she does continue to have left hemispheric sharp waves and spikes, now favors more to be brief seizures. Discussed with family and they now are in agreement in adding Topamax as fourth agent.   Impression  Partial Status epilepticus   Recommendations: Will add Topamax 200mg  BID, will need Ng tube to administer medication Vimpat 200 mg twice daily Keppra 1.5 g twice daily Dilantin 100 mg 3 times daily, recent level was 16.8 We will continue to monitor with EEG   Goals of care discussion:  Discussed with family, will see if adding Topamax stops seizures. If it does not, by Thursday - family wants patient to be comfort care. She is  DNR/DNI, burst suppression is  not an option according to family. They also do not want Phenobarbital.   Spent 35 min in the management of this neurologically criitically patient.   Karena Addison Aroor [facility] Pager Number [phone] For questions after 7pm please refer to AMION to reach the Neurologist on call

## 2017-12-09 NOTE — Progress Notes (Addendum)
Cortrak Tube Team Note:  RD called to place bridle. Cortrak tube placed by IR.  Cortrak located in R nare and bridled by this RD at 93 cm.  If the tube becomes dislodged please keep the tube and contact the Cortrak team at [email or web address] (password [email or web address]) for replacement. If after hours and replacement cannot be delayed, place a NG tube and confirm placement with an abdominal x-ray.    Gaynell Face, MS, RD, LDN Pager: [phone] Weekend/After Hours: [phone]

## 2017-12-09 NOTE — Progress Notes (Signed)
Initial Nutrition Assessment  DOCUMENTATION CODES:   Not applicable (will complete NFPE and assess for malnutrition at follow-up)  INTERVENTION:   TF via Cortrak: -  Initiate Osmolite 1.2 @ 20 ml/hr and increase by 10 ml q 12 hours until goal rate of 60 ml/hr is reached (1440 ml/day)  Tube feeding regimen at goal rate provides 1728 kcal, 80 grams of protein, and 1181 ml of H2O.   Monitor magnesium, potassium, and phosphorus daily for at least 3 days, MD to replete as needed, as pt is at risk for refeeding syndrome given little to no PO intake since 12/05/17.  - Recommend obtaining new weight as last charted weight is from 12/02/17  NUTRITION DIAGNOSIS:   Inadequate oral intake related to inability to eat as evidenced by NPO status.  GOAL:   Patient will meet greater than or equal to 90% of their needs  MONITOR:   Diet advancement, Labs, I & O's, Weight trends, TF tolerance, Skin  REASON FOR ASSESSMENT:   Consult Enteral/tube feeding initiation and management  ASSESSMENT:   77 year old female who presented to the ED s/p fall and stroke symptoms. PMH significant for CHF, CKD stage III, hypertension, seizure disorder, and schizoaffective disorder. Pt with known hepatocellular carcinoma and has refused treatment in the past.  8/20 - SLP recommendation for dysphagia 1 with think liquids 8/24 - transitioned back to NPO due to [place] 8/26 - palliative care team meeting 8/27 - Cortrak placed by IR  Palliative care team following. Pt has been having persistent seizures since admission. Family has decided against intubation with burst suppression.  Noted family has declined NG tube in the past but would now like to pursue enteral nutrition. Cortrak placed today by IR.  Spoke with family at bedside who reports pt had a good appetite and ate well PTA. Per family, pt was used to eating 3 meals daily and snacks between meals. Per pt's daughter, pt "enjoys food."  Pt's family reports  pt's weight has been stable recently.  Pt's family with multiple questions regarding feeding tube placement and initiation of enteral nutrition.  Meal Completion: 25-100% on dysphagia 1 (until pt was made NPO on 8/24)  Medications reviewed and include: 40 mg Protonix daily, Keppra, Vimpat  Labs reviewed: sodium 146 (H), chloride 112 (H), creatinine 1.05 (H), elevated LFTs, hemoglobin 10.8 (L), HCT 34.3 (L)  UOP: 1500 ml x 24 hours I/O's: +3.8 L since admission  NUTRITION - FOCUSED PHYSICAL EXAM:  Deferred per family. Suspect pt with some degree of malnutrition but unable to confirm at this time without completion of NFPE.  Diet Order:   Diet Order            Diet NPO time specified  Diet effective now              EDUCATION NEEDS:   No education needs have been identified at this time  Skin:  Skin Assessment: Reviewed RN Assessment (abrasion to R leg, L buttocks)  Last BM:  12/06/17 - medium type 6  Height:   Ht Readings from Last 1 Encounters:  06/10/17 5\' 3"  (1.6 m)    Weight:   Wt Readings from Last 1 Encounters:  12/02/17 60.2 kg    Ideal Body Weight:  52.27 kg  BMI:  Body mass index is 23.51 kg/m.  Estimated Nutritional Needs:   Kcal:  1500-1700  Protein:  70-95 grams  Fluid:  >/= 1.5 L    [name] Face, MS, RD, LDN Pager: [phone]  Weekend/After Hours: [phone]

## 2017-12-09 NOTE — Progress Notes (Signed)
MD Aroor gave verbal order to disconnect EEG temporarily for cortrak placement.

## 2017-12-09 NOTE — Procedures (Signed)
Electroencephalogram report- LTM  Ordering Physician : Cheral Marker, MD    Beginning date and time: 12/08/2017 07 30 am  Ending date and time: 12/09/2017 07 30 am   Day of study: 5 Cpt 95951  Medications include: Depakote, Keppra, Vimpat  HISTORY: This 24 hours of intensive EEG monitoring with simultaneous video monitoring was performed for this patient with right-sided twitching and altered mental status.  This EEG was requested to rule out subclinical electrographic seizures.  Data acquisition:  The study consists of a continuous 16-channel multi-montage digital video EEG recording with twenty-one electrodes placed according to the International 10-20 System. Additional leads included eye leads, true temporal leads (T1, T2), and an EKG lead. Activation procedures were not done due to mental status.  Day 2: Background activities marked by predominantly theta slowing ranging between 5 to 7 cps distributed broadly without well-defined posterior dominance.  Superimposed to very frequent left hemispheric sharp waves and spikes present maximum negativity mostly at C3 P3 negative field extending to posterior temporal and occipital cortex at times.  Also independent in the frequent left temporal spikes present.  In addition there is a right parietal sharp waves present however not as frequently as interictal epileptiform discharges across left hemisphere.  No clinical subclinical seizures present.  Day 3: No clinical or  subclinical seizures present.  Background activities marked by slowing and disorganization unchanged from previously recorded.  Superimposed multifocal sharp waves and spikes present throughout the recording however less prominent and not as frequent as during previous days of the recording.  Day 4: Background activities marked by continues background activity slowing and disorganization.  Superimposed multifocal sharp waves present independently involving the left and right hemisphere.   Left hemispheric sharp waves and spikes appear to be more frequent as recording progress.  During second half of the recording a left hemispheric spike and wave discharges appear in short rhythmic trains however without evolution to suggest discrete electrographic seizures   Day 5: Continues background activity slowing and disorganization essentially unchanged from previously recorded.  Sharp waves and spikes was seen predominantly across left hemisphere maximum negativity in the left anterior cortex.  Spike and wave discharges frequently seen in the short trains.  On one occasion around 6 AM patient had event accompanied by repetitive blinking and chewing.  Such movement and muscle artifact obscured EEG tracing however when artifact subsided there is a rhythmic synchronized waxing and waning left hemispheric spike and wave discharges present.  Based on the above findings seizure cannot be ruled out.  Clinical interpretation: This is day 5 of intensive EEG monitoring with simultaneous video monitoring marked by background activity slowing with superimposed multifocal sharp waves and spikes suggestive of encephalopathy and significant cortical irritability.  Hemispheric spike and wave discharges as discussed above were present more prominently throughout the recording.  Brief electrographic seizures cannot be completely ruled out.  Episode accompanied by frequent eye blinking and chewing with concomitant likely ictal EEG could represent clinical and electrographic seizure as discussed above.  Clinical correlation is advised

## 2017-12-09 NOTE — Progress Notes (Signed)
Paged by nurse for assistance with NG tube vs Cortrakdiscussion for delivery of Topamax, which is only an oral administered drug.  Nurse notes that Cortrakteam is not here today and patient would have to go to IR in order to have this placed, which would require stopped continuous EEG.   Went to bedside to discuss NG tube vs Cortrakwith family.  Explained to them that both would essentially be tubes that extended from the patient's nose to her stomach, but that the NG tube could be inserted at the bedside by the nurse, and that the Cortrakis smaller, less rigid, but would need to be inserted by IR and EEG would have to be stopped.  Family was in agreement that they would like to proceed with the Cortrak placement, but would like to speak with Dr. Lorraine Lax first.  Agree that it should be clarified with Dr. Lorraine Lax if EEG can be held during this procedure.  The family also notes that with either tube in place, they would consider starting feeds for the patient.  They have also requested to specifically speak with Dr. Lorraine Lax about this prior to making a decision.    Nurse spoke with Dr. Lorraine Lax who stated that EEG was okay to be disconnected for Cortrak placement, but to leave the leads in place.  Cortrack order already placed by Nursing.  Spoke with IR, they are aware of the order and state no further orders are necessary in order to complete this.  [place] Constable, D.O.  PGY-1 Family Medicine  12/09/2017 2:25 PM

## 2017-12-10 LAB — BASIC METABOLIC PANEL
Anion gap: 8 (ref 5–15)
BUN: 13 mg/dL (ref 8–23)
CHLORIDE: 112 mmol/L — AB (ref 98–111)
CO2: 23 mmol/L (ref 22–32)
CREATININE: 0.91 mg/dL (ref 0.44–1.00)
Calcium: 8.9 mg/dL (ref 8.9–10.3)
GFR calc non Af Amer: 59 mL/min — ABNORMAL LOW (ref >60)
Glucose, Bld: 139 mg/dL — ABNORMAL HIGH (ref 70–99)
POTASSIUM: 3.4 mmol/L — AB (ref 3.5–5.1)
SODIUM: 143 mmol/L (ref 135–145)

## 2017-12-10 LAB — GLUCOSE, CAPILLARY
GLUCOSE-CAPILLARY: 123 mg/dL — AB (ref 70–99)
GLUCOSE-CAPILLARY: 118 mg/dL — AB (ref 70–99)
Glucose-Capillary: 115 mg/dL — ABNORMAL HIGH (ref 70–99)
Glucose-Capillary: 136 mg/dL — ABNORMAL HIGH (ref 70–99)
Glucose-Capillary: 137 mg/dL — ABNORMAL HIGH (ref 70–99)
Glucose-Capillary: 142 mg/dL — ABNORMAL HIGH (ref 70–99)

## 2017-12-10 LAB — MAGNESIUM: MAGNESIUM: 1.6 mg/dL — AB (ref 1.7–2.4)

## 2017-12-10 LAB — PHOSPHORUS: PHOSPHORUS: 2.6 mg/dL (ref 2.5–4.6)

## 2017-12-10 MED ORDER — MAGNESIUM SULFATE 2 GM/50ML IV SOLN
2.0000 g | Freq: Once | INTRAVENOUS | Status: AC
  Administered 2017-12-10: 2 g via INTRAVENOUS
  Filled 2017-12-10: qty 50

## 2017-12-10 MED ORDER — POTASSIUM CHLORIDE 10 MEQ/100ML IV SOLN
10.0000 meq | INTRAVENOUS | Status: DC
  Administered 2017-12-10: 10 meq via INTRAVENOUS
  Filled 2017-12-10 (×4): qty 100

## 2017-12-10 MED ORDER — OSMOLITE 1.2 CAL PO LIQD
1000.0000 mL | ORAL | Status: DC
  Administered 2017-12-10 – 2017-12-12 (×4): 1000 mL
  Filled 2017-12-10 (×4): qty 1000

## 2017-12-10 MED ORDER — WHITE PETROLATUM EX OINT
TOPICAL_OINTMENT | CUTANEOUS | Status: AC
  Filled 2017-12-10: qty 28.35

## 2017-12-10 MED ORDER — VALPROATE SODIUM 500 MG/5ML IV SOLN
900.0000 mg | Freq: Once | INTRAVENOUS | Status: AC
  Administered 2017-12-10: 900 mg via INTRAVENOUS
  Filled 2017-12-10: qty 9

## 2017-12-10 MED ORDER — POTASSIUM CHLORIDE 20 MEQ/15ML (10%) PO SOLN: 40.0000 meq | Freq: Once | ORAL | Status: DC

## 2017-12-10 MED ORDER — POTASSIUM CHLORIDE 20 MEQ/15ML (10%) PO SOLN
40.0000 meq | Freq: Once | ORAL | Status: AC
  Administered 2017-12-10: 40 meq
  Filled 2017-12-10: qty 30

## 2017-12-10 MED ORDER — LORAZEPAM 2 MG/ML IJ SOLN
0.5000 mg | Freq: Four times a day (QID) | INTRAMUSCULAR | Status: DC
  Administered 2017-12-10: 0.5 mg via INTRAVENOUS
  Filled 2017-12-10: qty 1

## 2017-12-10 MED ORDER — LORAZEPAM BOLUS VIA INFUSION
0.5000 mg | INTRAVENOUS | Status: DC | PRN
  Filled 2017-12-10: qty 1

## 2017-12-10 MED ORDER — SODIUM PHOSPHATES 45 MMOLE/15ML IV SOLN
10.0000 mmol | Freq: Once | INTRAVENOUS | Status: AC
  Administered 2017-12-10: 10 mmol via INTRAVENOUS
  Filled 2017-12-10: qty 3.33

## 2017-12-10 NOTE — Progress Notes (Signed)
EEG maint complete. Moved forehead leads to prevent any skin breakdown. No skin breakdown noted. Continue to monitor

## 2017-12-10 NOTE — Progress Notes (Addendum)
Reason for consult: Seizures  Subjective: Patient had multiple brief episodes of eye twitching, Has remained more drowsy this morning.    ROS: Unable to obtain due to poor mental statu  Examination  Vital signs in last 24 hours: Temp:  [97.6 F (36.4 C)-98.6 F (37 C)] 98.6 F (37 C) (08/28 1600) Pulse Rate:  [62-82] 79 (08/28 1600) Resp:  [18-20] 18 (08/28 1600) BP: (125-157)/(55-87) 130/58 (08/28 1600) SpO2:  [99 %-100 %] 100 % (08/28 1600)  General: Not in distress, cooperative CVS: pulse-normal rate and rhythm RS: breathing comfortably Extremities: normal   Neuro: MS: drowsy, arouses to sternal rub, mumbles incoherently CN: pupils equal and reactive,  No forced gaze deviation Motor: will keep passively lifted arm on left side against gravity, not on right side. Right lower leg also does not withdraw as much as left side  Reflexes: 2+ bilaterally over patella, biceps, plantars: flexor Coordination: normal Gait: not tested  Basic Metabolic Panel: Recent Labs  Lab 12/06/17 0554 12/06/17 1841 12/07/17 0633 12/08/17 0831 12/10/17 0425  NA 150* 148* 147* 146* 143  K 5.2* 4.0 4.0 3.9 3.4*  CL 116* 115* 113* 112* 112*  CO2 25 25 25 24 23   GLUCOSE 114* 196* 152* 138* 139*  BUN 25* 20 18 19 13   CREATININE 1.36* 1.14* 1.02* 1.05* 0.91  CALCIUM 9.7 9.7 9.6 9.5 8.9  MG  --   --   --   --  1.6*  PHOS  --   --   --   --  2.6    CBC: Recent Labs  Lab 12/05/17 0442 12/05/17 0950 12/06/17 0554 12/07/17 0633 12/08/17 0831  WBC 14.0* 13.3* 14.2* 11.4* 8.6  NEUTROABS  --   --   --  8.6*  --   HGB 10.6* 10.4* 10.8* 10.7* 10.8*  HCT 33.2* 34.0* 33.5* 34.5* 34.3*  MCV 80.2 83.5 79.4 81.2 80.3  PLT 279 136* 286 285 244     Coagulation Studies: No results for input(s): LABPROT, INR in the last 72 hours.  Imaging Reviewed:     ASSESSMENT AND PLAN 77 year old female with epilepsy who presented with 3 seizuresafter being tapered offvalproic acid due to recent  liver cancer.She was started Keppra in-house and initial EEG was normal but then developed partial complex status on 8/22 with continuous right-sided twitching and left hemispheric EEG correlation. MRI brain finding showed subtle gyriform diffusion abnormality involving the parasagittal left parietal occipital region and possibly left thalamus - likely from status epilepticus.  Continues to have seizures despite addition of Topamax to already being on 3 AEDs.  ( Of note official EEG report states blinking episodes are not electrographic sz, although clinical correlation advised- in the clinical setting of  continued cortical irritability on the left side and weakness on right arm and leg I do think these represent seizures).   Plan Will start Ativan 0.5mg , however patient became very drowsy. Will hold scheduled Ativan Will load with Valproic acid and discontinue Dilantin. Check LFT and Ammonia tomorrow. Repeat VPA level tomorrow to decide on maintenance dosing.  Confirmed with family- they do not want to persue burst suppression as this will be against wishes of patient.  [name] [facility] Pager Number [phone] For questions after 7pm please refer to AMION to reach the Neurologist on call

## 2017-12-10 NOTE — Progress Notes (Signed)
Patient ID: [name], female   DOB: Jan 20, 1941, 77 y.o.   MRN: [id number]  This NP visited patient at the bedside for ongoing emotional support and palliative medicine needs.  Family at bedside.     Continued conversation regarding diagnoses, prognosis, GOCss, EOL wishes, disposition and options.    Currently patient has core track placed for medication administration.  Neurology continues to make medication changes hoping to arrest seizure activity.  Currently at this time patient has visible face twitching, is nonverbal, and unable to follow commands.    -Per family plan is to continue on this treatment path through Thursday and depending on outcomes, a shift to full comfort is likely.  Shared with family my concern regarding the patient's current medical condition of frailty/debility and overall failure to thrive.  Discussed with family the importance of continued conversation with the  medical providers regarding overall plan of care and treatment options,  ensuring decisions are within the context of the patients values and GOCs.  Questions and concerns addressed.    Discussed with Dr Lorraine Lax.  Total time spent on the unit was 35 minutes  Greater than 50% of the time was spent in counseling and coordination of care  Wadie Lessen NP  Palliative Medicine Team Team Phone # [phone] Pager [phone]

## 2017-12-10 NOTE — Progress Notes (Signed)
Called for possible seizure activity- facial twitching. Lasted a couple of minutes. Resolved spontaneously with intermittent return of some subtle face twitching on right. She remains drowsy. EEG does not show clear EEG seizure. I have ordered 0.5mg  ativan PRN for seizure >57min with instructions to call me at the time as well. -- Amie Portland, MD [facility] Pager: [phone] If 7pm to 7am, please call on call as listed on AMION.

## 2017-12-10 NOTE — Progress Notes (Signed)
Give valproic acid now per MD Aroor verbal order

## 2017-12-10 NOTE — Progress Notes (Signed)
Family Medicine Teaching Service Daily Progress Note Intern Pager: [phone]  Patient name: [name] Medical record number: [id number] Date of birth: 04-08-1941 Age: 77 y.o. Gender: female  Primary Care Provider: Everrett Coombe, MD Consultants: Neuro, palliative, psychiatry Code Status: Full Code  Pt Overview and Major Events to Date:  8/19 admitted for AMS, found down up to 24 hours 8/22 given ativan and Phenytoin for clinical seizure activity noted around 11 AM 8/28 cortatrak placed by IR  Assessment and Plan: [name] T Reavesis a 77 y.o.femalepresenting with AMS and being found down with last known normal on Sunday 2:30pm likely due to seizure at home. PMH is significant forHCC, Schizoaffective Disorder,HxSeizure, Chronic D CHF, HLD, HTN, CKD III, GERD.   Seizures - Neurology following. 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 She is being treated with Vimpat, Keppra, Dilantin.  Per neurology notes no seizures on EEG from 8/25-8/26.  Somnolence due to seizures vs sedative effects from seizure medications.  Current plan is to continue monitoring to see if her mental status begins to improve. On my exam today- she was moaning and not able to answer questions or follow commands. Nurse paged at 0900 to notify patient had a few minute episode of blinking and grunting with EEG changes. She is also going to notify neuro - Neurology following, appreciate recs - continuous EEG - monitor on telemetry - continue vimpat, keppra and dilantin pending neuro recs - seizure precautions - NPO pending improvement in mentation - cortrak placed8/27, continue enteral feeds  Electrolyte depletion- Patient;s electrolytes were low over night. Mg 1.6, Phos 2.6, K 3.4 -Patient given magnesium and Phos IV -37mEq K IV, with 82mEq oral per tube  AMS -awake this AM, blinking. Remains aphasic and does not follow commands. AMS thought to be due to seizure activity/post-ictal/multiple sedating  meds for seizure.  -  Seizure management as above - continue NPO status - cortrak placement 8/27 for enteral feeds  Schizoeffective/Bipolar disorder - previously well-controlled on depakote and risperdol, however depakote had been titrated down due to elevated LFTs which were most likely abnormal due to [facility]. Unclear if current AMS is new baseline. - holding home Risperdal for ongoing AMS - plan to re-consult psych for additional assistance in med management if patient becomes able to participate in the interview  Fall at Home, unwitnessed. Seems most likely due to seizure. Patient underwent trauma workup including R tib/fib XR, R Humerus and elbow XR, CXR which were all negative for bony abnormalities.  - avoid sedating pain medications d/t AMS  Leukocytosis, resolved - likely was due to ongoing seizure activity as no seizure activity noted on EEG for the past couple of days. No evidence of infection, no fevers, vitals stable.  - did not check CBC today, has been stable over past few days  Known Hepatocellular Carcinoma: Confirmed by Biopsy 2018  ALT/AST slightly increased from yesterday (AST 135, ALT 196). Patient has expressed not wanting treatment. - patient made DNI/DNR during palliative meeting on 8/26  HTN - elevated overnight with max SBP 170-190. She was not given PRN hydral. On home diltiazem 60 mg q8H but hasn't been able to take due to NPO. Can consider adding low dose IV if remains NPO. - home dilt is ordered but she will be unable to take it while NPO - PRN q4H hydral 5mg  IV systolic >220, diastolic >254  HLD No home statin. Has chronically elevated LFTs 2/2 HCC. On ASA 81mg  at home. - cont home ASA 81mg   CKD  III Cr 1.22 on admission. Creatinine today is 1.05. Continues with IV fluids. - cont to monitor BMP  GERD Protonix at home. - started protonix in oral suspension  Anemia -  BL between 10-12. Hgb Stable at baseline, monitored daily. Consider anemia workup when  patient is not acutely ill, possibly on outpatient side. Hgb 10.8 today  FEN/GI:NPO Prophylaxis:Heparin  Disposition: Continue inpatient management of seizure activity, palliative meeting today  Subjective:  Overnight: No acute events. Nurse paged to get CBG checks and insulin order. Night team put on CBG checks q4 and repleted Mag, Phos, K Today: Patient is awake when I enter the room. Does not follow commands. Mumbles incoherently. Does not indicate that she is in pain when asked. Nurse paged about patient having few minute episode of grunting and eye blinking with EEG changes- she also paged neuro.  Objective: Temp:  [97.5 F (36.4 C)-98.2 F (36.8 C)] 97.6 F (36.4 C) (08/28 0347) Pulse Rate:  [62-86] 62 (08/28 0347) Resp:  [18-20] 20 (08/28 0347) BP: (125-159)/(55-84) 125/55 (08/28 0347) SpO2:  [98 %-100 %] 99 % (08/28 0347)  Physical Exam: General: NAD, rests in bed, opens eyes and blinks Cardiovascular: RRR, no m/r/g Respiratory: CTA bil anteriorly Abdomen: soft, non-distended, no HSM Extremities: no LE edema Neuro: does not follow commands. Pupils equal and reactive.   Laboratory: Recent Labs  Lab 12/06/17 0554 12/07/17 0633 12/08/17 0831  WBC 14.2* 11.4* 8.6  HGB 10.8* 10.7* 10.8*  HCT 33.5* 34.5* 34.3*  PLT 286 285 244   Recent Labs  Lab 12/06/17 1841 12/07/17 0633 12/08/17 0831  NA 148* 147* 146*  K 4.0 4.0 3.9  CL 115* 113* 112*  CO2 25 25 24   BUN 20 18 19   CREATININE 1.14* 1.02* 1.05*  CALCIUM 9.7 9.6 9.5  PROT  --  6.0* 5.9*  BILITOT  --  0.7 0.8  ALKPHOS  --  154* 159*  ALT  --  174* 196*  AST  --  108* 135*  GLUCOSE 196* 152* 138*   Imaging/Diagnostic Tests: Dg Abd 1 View  Result Date: 12/09/2017 CLINICAL DATA:  Placement of nasoenteric feeding tube EXAM: ABDOMEN - 1 VIEW COMPARISON:  CT abdomen pelvis 05/27/2016 FINDINGS: Under fluoroscopy, a feeding tube was advanced by radiologic technologist into the stomach and then into the  duodenal loop with the tip placed at the ligament of Treitz. This location is documented by injection of a small amount Isovue-300, i.e. 10 mL. Fluoroscopy time of 2 minutes 6 seconds was recorded. IMPRESSION: Nasoenteric feeding tube placed under fluoroscopic control with the tip at the ligament of Treitz. Electronically Signed   By: Ivar Drape M.D.   On: 12/09/2017 16:48    Richarda Osmond, DO 12/10/2017, 5:40 AM PGY-1, [facility] Intern pager: [phone], text pages welcome

## 2017-12-10 NOTE — Progress Notes (Signed)
RN noted 5.5 minute episode of rhythmic eye blinking, lower lip twitching, and grunting. During the episode the patient was unresponsive. Pt lethargic afterwards. MD notified

## 2017-12-10 NOTE — Procedures (Addendum)
Electroencephalogram report- LTM  Ordering Physician : Cheral Marker, MD    Beginning date and time: 12/09/2017 07 30 am  Ending date and time: 12/10/2017 07 30 am   Day of study: 6 Cpt 95951  Medications include: Depakote, Keppra, Vimpat  HISTORY: This 24 hours of intensive EEG monitoring with simultaneous video monitoring was performed for this patient with right-sided twitching and altered mental status.  This EEG was requested to rule out subclinical electrographic seizures.  Data acquisition:  The study consists of a continuous 16-channel multi-montage digital video EEG recording with twenty-one electrodes placed according to the International 10-20 System. Additional leads included eye leads, true temporal leads (T1, T2), and an EKG lead. Activation procedures were not done due to mental status.  Day 2: Background activities marked by predominantly theta slowing ranging between 5 to 7 cps distributed broadly without well-defined posterior dominance.  Superimposed to very frequent left hemispheric sharp waves and spikes present maximum negativity mostly at C3 P3 negative field extending to posterior temporal and occipital cortex at times.  Also independent in the frequent left temporal spikes present.  In addition there is a right parietal sharp waves present however not as frequently as interictal epileptiform discharges across left hemisphere.  No clinical subclinical seizures present.  Day 3: No clinical or  subclinical seizures present.  Background activities marked by slowing and disorganization unchanged from previously recorded.  Superimposed multifocal sharp waves and spikes present throughout the recording however less prominent and not as frequent as during previous days of the recording.  Day 4: Background activities marked by continues background activity slowing and disorganization.  Superimposed multifocal sharp waves present independently involving the left and right hemisphere.   Left hemispheric sharp waves and spikes appear to be more frequent as recording progress.  During second half of the recording a left hemispheric spike and wave discharges appear in short rhythmic trains however without evolution to suggest discrete electrographic seizures   Day 5: Continues background activity slowing and disorganization essentially unchanged from previously recorded.  Sharp waves and spikes was seen predominantly across left hemisphere maximum negativity in the left anterior cortex.  Spike and wave discharges frequently seen in the short trains.  On one occasion around 6 AM patient had event accompanied by repetitive blinking and chewing.  Such movement and muscle artifact obscured EEG tracing however when artifact subsided there is a rhythmic synchronized waxing and waning left hemispheric spike and wave discharges present.  Based on the above findings seizure cannot be ruled out.  Day 6: Background activities marked by continuous background activity slowing and disorganization.  Superimposed fairly frequent left hemispheric sharp waves and spikes involving mostly left central parietal frontocentral and at times anterior temporal region.  At times such left anterior spikes occur in periodic fashion in short trains however without evolution to suggest seizures.  Multiple episodic eye blinking were present and were not accompanied by any obvious EEG changes to suggest seizures.  Clinical interpretation: This is day 6 of intensive EEG monitoring with simultaneous video monitoring is abnormal for several reasons #1 background activity slowing and disorganization suggestive of encephalopathy of nonspecific etiologies #2 frequent left hemispheric spikes involving primarily left anterior cortex still present throughout the recording suggestive of significant cortical irritability in those regions.  #3 multiple episodic eye blinking were present.  These are not seizures.  Clinical correlation is  advised

## 2017-12-10 NOTE — Progress Notes (Signed)
Per neurology, hold valproic acid unitl 2pm and hold next dose of ativan.

## 2017-12-11 DIAGNOSIS — Z79899 Other long term (current) drug therapy: Secondary | ICD-10-CM

## 2017-12-11 LAB — VALPROIC ACID LEVEL: Valproic Acid Lvl: 27 ug/mL — ABNORMAL LOW (ref 50.0–100.0)

## 2017-12-11 LAB — COMPREHENSIVE METABOLIC PANEL
ALT: 223 U/L — ABNORMAL HIGH (ref 0–44)
AST: 136 U/L — AB (ref 15–41)
Albumin: 2.2 g/dL — ABNORMAL LOW (ref 3.5–5.0)
Alkaline Phosphatase: 196 U/L — ABNORMAL HIGH (ref 38–126)
Anion gap: 7 (ref 5–15)
BUN: 14 mg/dL (ref 8–23)
CO2: 22 mmol/L (ref 22–32)
Calcium: 8.9 mg/dL (ref 8.9–10.3)
Chloride: 113 mmol/L — ABNORMAL HIGH (ref 98–111)
Creatinine, Ser: 1.02 mg/dL — ABNORMAL HIGH (ref 0.44–1.00)
GFR calc Af Amer: 59 mL/min — ABNORMAL LOW (ref >60)
GFR calc non Af Amer: 51 mL/min — ABNORMAL LOW (ref >60)
GLUCOSE: 179 mg/dL — AB (ref 70–99)
POTASSIUM: 4 mmol/L (ref 3.5–5.1)
SODIUM: 142 mmol/L (ref 135–145)
Total Bilirubin: 0.5 mg/dL (ref 0.3–1.2)
Total Protein: 5.5 g/dL — ABNORMAL LOW (ref 6.5–8.1)

## 2017-12-11 LAB — CBC
HEMATOCRIT: 32.5 % — AB (ref 36.0–46.0)
Hemoglobin: 10.2 g/dL — ABNORMAL LOW (ref 12.0–15.0)
MCH: 25.6 pg — AB (ref 26.0–34.0)
MCHC: 31.4 g/dL (ref 30.0–36.0)
MCV: 81.5 fL (ref 78.0–100.0)
Platelets: 261 10*3/uL (ref 150–400)
RBC: 3.99 MIL/uL (ref 3.87–5.11)
RDW: 18.6 % — ABNORMAL HIGH (ref 11.5–15.5)
WBC: 7.5 10*3/uL (ref 4.0–10.5)

## 2017-12-11 LAB — PHOSPHORUS: PHOSPHORUS: 2.4 mg/dL — AB (ref 2.5–4.6)

## 2017-12-11 LAB — GLUCOSE, CAPILLARY
GLUCOSE-CAPILLARY: 163 mg/dL — AB (ref 70–99)
GLUCOSE-CAPILLARY: 138 mg/dL — AB (ref 70–99)
GLUCOSE-CAPILLARY: 114 mg/dL — AB (ref 70–99)
Glucose-Capillary: 138 mg/dL — ABNORMAL HIGH (ref 70–99)
Glucose-Capillary: 129 mg/dL — ABNORMAL HIGH (ref 70–99)

## 2017-12-11 LAB — MAGNESIUM: MAGNESIUM: 2.1 mg/dL (ref 1.7–2.4)

## 2017-12-11 LAB — AMMONIA: AMMONIA: 55 umol/L — AB (ref 9–35)

## 2017-12-11 MED ORDER — LORAZEPAM 2 MG/ML IJ SOLN
0.5000 mg | Freq: Once | INTRAMUSCULAR | Status: AC
  Administered 2017-12-11: 0.5 mg via INTRAVENOUS

## 2017-12-11 MED ORDER — VALPROATE SODIUM 500 MG/5ML IV SOLN
250.0000 mg | Freq: Three times a day (TID) | INTRAVENOUS | Status: DC
  Administered 2017-12-11 – 2017-12-21 (×30): 250 mg via INTRAVENOUS
  Filled 2017-12-11 (×33): qty 2.5

## 2017-12-11 MED ORDER — LORAZEPAM BOLUS VIA INFUSION: 0.5000 mg | Freq: Four times a day (QID) | INTRAVENOUS | Status: DC | PRN

## 2017-12-11 MED ORDER — LORAZEPAM 2 MG/ML IJ SOLN
INTRAMUSCULAR | Status: AC
  Administered 2017-12-11: 0.5 mg via INTRAVENOUS
  Filled 2017-12-11: qty 1

## 2017-12-11 MED ORDER — VALPROATE SODIUM 500 MG/5ML IV SOLN
1000.0000 mg | Freq: Once | INTRAVENOUS | Status: AC
  Administered 2017-12-12: 1000 mg via INTRAVENOUS
  Filled 2017-12-11: qty 10

## 2017-12-11 MED ORDER — K PHOS MONO-SOD PHOS DI & MONO 155-852-130 MG PO TABS
250.0000 mg | ORAL_TABLET | Freq: Three times a day (TID) | ORAL | Status: AC
  Administered 2017-12-11 (×3): 250 mg via ORAL
  Filled 2017-12-11 (×3): qty 1

## 2017-12-11 MED ORDER — LORAZEPAM 2 MG/ML IJ SOLN
0.5000 mg | Freq: Four times a day (QID) | INTRAMUSCULAR | Status: DC | PRN
  Administered 2017-12-11 – 2017-12-12 (×2): 0.5 mg via INTRAVENOUS
  Filled 2017-12-11: qty 1

## 2017-12-11 MED ORDER — LORAZEPAM 2 MG/ML IJ SOLN
INTRAMUSCULAR | Status: AC
  Filled 2017-12-11: qty 1

## 2017-12-11 NOTE — Progress Notes (Signed)
Patient ID: [name], female   DOB: 10/30/1940, 77 y.o.   MRN: [id number]  This NP visited patient at the bedside for ongoing emotional support and palliative medicine needs.  Family at bedside; two daughters and patient's brother     Continued conversation regarding current medical situation.  Family verbalize that they are in agreement with neurology to continue current medical interventions at least through tomorrow evening, hoping to break seizure activity.    Patient's brother asks me specifically asked about "what will things look like if things continue the same"?  Discussed the details of shifting to a full comfort path. Focus of care would be on comfort and dignity.  No further diagnostics would be necessary, i.e. blood work, EEG.  No further life prolonging interventions; no artificial feeding and hydration, minimize medications limited only for comfort measures.  Offer patient sips and chips as tolerated.  Keep patient clean and dry.  Family presents.  Likely transition to residential hospice.  Daughters and brother all acknowledged that this is what the patient would want.  I shared with family and Dr Lorraine Lax that I would not be in the hospital through the weekend but that the palliative medicine team is available for any questions or concerns through the team phone # [phone].   Questions and concerns addressed.     Total time spent on the unit was 35 minutes  Greater than 50% of the time was spent in counseling and coordination of care  Wadie Lessen NP  Palliative Medicine Team Team Phone # [phone] Pager [phone]

## 2017-12-11 NOTE — Procedures (Addendum)
Electroencephalogram report- LTM  Ordering Physician : Cheral Marker, MD    Beginning date and time: 12/10/2017 07 30 am  Ending date and time: 12/11/2017 07 30 am   Day of study: 7 Cpt 95951  Medications include: Depakote, Keppra, Vimpat  HISTORY: This 24 hours of intensive EEG monitoring with simultaneous video monitoring was performed for this patient with right-sided twitching and altered mental status.  This EEG was requested to rule out subclinical electrographic seizures.  Data acquisition:  The study consists of a continuous 16-channel multi-montage digital video EEG recording with twenty-one electrodes placed according to the International 10-20 System. Additional leads included eye leads, true temporal leads (T1, T2), and an EKG lead. Activation procedures were not done due to mental status.  Day 2: Background activities marked by predominantly theta slowing ranging between 5 to 7 cps distributed broadly without well-defined posterior dominance.  Superimposed to very frequent left hemispheric sharp waves and spikes present maximum negativity mostly at C3 P3 negative field extending to posterior temporal and occipital cortex at times.  Also independent in the frequent left temporal spikes present.  In addition there is a right parietal sharp waves present however not as frequently as interictal epileptiform discharges across left hemisphere.  No clinical subclinical seizures present.  Day 3: No clinical or  subclinical seizures present.  Background activities marked by slowing and disorganization unchanged from previously recorded.  Superimposed multifocal sharp waves and spikes present throughout the recording however less prominent and not as frequent as during previous days of the recording.  Day 4: Background activities marked by continues background activity slowing and disorganization.  Superimposed multifocal sharp waves present independently involving the left and right hemisphere.   Left hemispheric sharp waves and spikes appear to be more frequent as recording progress.  During second half of the recording a left hemispheric spike and wave discharges appear in short rhythmic trains however without evolution to suggest discrete electrographic seizures   Day 5: Continues background activity slowing and disorganization essentially unchanged from previously recorded.  Sharp waves and spikes was seen predominantly across left hemisphere maximum negativity in the left anterior cortex.  Spike and wave discharges frequently seen in the short trains.  On one occasion around 6 AM patient had event accompanied by repetitive blinking and chewing.  Such movement and muscle artifact obscured EEG tracing however when artifact subsided there is a rhythmic synchronized waxing and waning left hemispheric spike and wave discharges present.  Based on the above findings seizure cannot be ruled out.  Day 6: Background activities marked by continuous background activity slowing and disorganization.  Superimposed fairly frequent left hemispheric sharp waves and spikes involving mostly left central parietal frontocentral and at times anterior temporal region.  At times such left anterior spikes occur in periodic fashion in short trains however without evolution to suggest seizures.  Multiple episodic eye blinking were present and were not accompanied by any obvious EEG changes to suggest seizures.  Day 7: Left hemispheric sharp waves become more frequent involving mostly posterior left hemispheric cortex.  At times occur in a rhythmic trains.  Pushbutton was not noted for events accompanied by some eyes blinking.  Video was suboptimal.  On one occasion it is also noted that she raised her left arm slightly the rest of the clinical manifestations not available.  I movements artifact were noted on EEG.  When artifact subside rhythmic left hemispheric near continuous spike and wave and sharp waves present.  However  at times shorter duration  eye blinking events did not have any well-demarcated EEG changes.  Clinical interpretation: This is day 7 of intensive EEG monitoring is abnormal and marked by increased frequency of left hemispheric sharp waves and spikes involving primarily posterior temporal and parietal cortex.  Brief electrographic seizures cannot be ruled out.  Events accompanied by eyes movement not have a definitive EEG correlate however given persistently abnormal EEG this events potentially could be related to underlying cortical irritability and seizures cannot be completely ruled out.  Clinical correlation is advised

## 2017-12-11 NOTE — Progress Notes (Signed)
LTM EEG checked, no skin breakdown on frontal leads. Paste added to T6 and O2

## 2017-12-11 NOTE — Progress Notes (Signed)
SLP Cancellation Note  Patient Details Name: [name] MRN: [id number] DOB: 1940-11-01   Cancelled treatment:       Reason Eval/Treat Not Completed: Fatigue/lethargy limiting ability to participate. Will sign off at this time, please reorder when arousal is appropriate for PO intake.    Jayda White, Katherene Ponto 12/11/2017, 8:18 AM

## 2017-12-11 NOTE — Progress Notes (Signed)
Neurologist in to see pt and informed RN pt had seizures all night and ordered 0.5mg  Ativan to give. Medication given and will continue to monitor pt. Delia Heady RN

## 2017-12-11 NOTE — Progress Notes (Signed)
Chaplain met  the daughters, and brother of the patient.  The family was appreciative of the visit, the daughter shared they are working closely with the medical staff regarding their mother's medical status. She shared,  as a family, the patient has made them aware of what her desires are regarding EOL/comfort care measures. If  they are advised by the medical team, and/or they experience their mother's medical status declines from what they know as loved ones to be her normal level they will honor her wishes. Chaplain offered listening presence, and emotional support for the family. Chaplains will continue to provide spiritual care support for this family. Chaplain Yaakov Guthrie [phone]

## 2017-12-11 NOTE — Progress Notes (Signed)
Reason for consult: Seizures  Subjective: Patient awake this morning, mumbling incoherently.  Not following any commands.  Overnight patient had episode of twitching lasting for couple minutes.  Appears to have multiple episodes overnight.   ROS: negative except above   Examination  Vital signs in last 24 hours: Temp:  [97.6 F (36.4 C)-98.6 F (37 C)] 98.4 F (36.9 C) (08/29 1212) Pulse Rate:  [73-102] 102 (08/29 1212) Resp:  [17-18] 18 (08/29 1212) BP: (124-160)/(58-80) 158/79 (08/29 1212) SpO2:  [99 %-100 %] 100 % (08/29 1212) Weight:  [60.2 kg] 60.2 kg (08/28 2344)  General: Not in distress, drowsy CVS: pulse-normal rate and rhythm RS: breathing comfortably Extremities: normal   Neuro: MS: drowsy, arouses to sternal rub, mumbles incoherently CN: pupils equal and reactive,  No forced gaze deviation, tends to prefer left side Motor:  Right arm and leg not moving spontaneously, left side is moving spontaneouslyr Coordination: Unable to assess Gait: not tested   Basic Metabolic Panel: Recent Labs  Lab 12/06/17 1841 12/07/17 0633 12/08/17 0831 12/10/17 0425 12/11/17 0244  NA 148* 147* 146* 143 142  K 4.0 4.0 3.9 3.4* 4.0  CL 115* 113* 112* 112* 113*  CO2 25 25 24 23 22   GLUCOSE 196* 152* 138* 139* 179*  BUN 20 18 19 13 14   CREATININE 1.14* 1.02* 1.05* 0.91 1.02*  CALCIUM 9.7 9.6 9.5 8.9 8.9  MG  --   --   --  1.6* 2.1  PHOS  --   --   --  2.6 2.4*    CBC: Recent Labs  Lab 12/05/17 0950 12/06/17 0554 12/07/17 0633 12/08/17 0831 12/11/17 0244  WBC 13.3* 14.2* 11.4* 8.6 7.5  NEUTROABS  --   --  8.6*  --   --   HGB 10.4* 10.8* 10.7* 10.8* 10.2*  HCT 34.0* 33.5* 34.5* 34.3* 32.5*  MCV 83.5 79.4 81.2 80.3 81.5  PLT 136* 286 285 244 261     Coagulation Studies: No results for input(s): LABPROT, INR in the last 72 hours.  EEG reviewed Clinical interpretation: This is day 7 of intensive EEG monitoring is abnormal and marked by increased frequency of  left hemispheric sharp waves and spikes involving primarily posterior temporal and parietal cortex.  Brief electrographic seizures cannot be ruled out.  Events accompanied by eyes movement not have a definitive EEG correlate however given persistently abnormal EEG this events potentially could be related to underlying cortical irritability and seizures cannot be completely ruled out.  Clinical correlation is advised  ASSESSMENT AND PLAN   77 year old female with epilepsy who presented with 3 seizuresafter being tapered offvalproic acid due to recent liver cancer.She was started Keppra in-house and initial EEG was normal but then developed partial complex status on 8/22 with continuous right-sided twitching and left hemispheric EEG correlation. MRI brain finding showed subtle gyriform diffusion abnormality involving the parasagittal left parietal occipital region and possibly left thalamus - likely from status epilepticus.  Continues to have seizures after VPA load yesterday, level today morning was 27.  Received Ativan with improvement in the background this morning, however seizures returned in the afternoon.   Discussed scheduled Ativan and another VPA load with family although I am afraid she is now high aspiration risk. Family agree with plan, if not better by tomorrow would like to transition to comfort care.   Confirmed with family- they do not want to persue burst suppression as this will be against wishes of patient.    Impression  Refractory  Partial status epilepticus  - will schedule ativan and reload with valproic acid knowing she is high aspiration risk and respiratory failure - Continue remaining AEDs - If no improvement, likely will transition to comfort care which is reasonable given underling cancer diagnosis and unable to stop seizures.     This patient is neurologically critically ill due to status epilepticus. She is at risk for significant risk of neurological  worsening from worsening seizures,  heart failure, hemorrhagic conversion, infection, respiratory failure. This patient's care requires constant monitoring of vital signs, hemodynamics, respiratory and cardiac monitoring, review of multiple databases, neurological assessment, discussion with family, other specialists and medical decision making of high complexity.  I spent  70 minutes of neurocritical time in the care of this patient.       Karena Addison Reida Hem [facility] Pager Number [phone] For questions after 7pm please refer to AMION to reach the Neurologist on call

## 2017-12-11 NOTE — Progress Notes (Signed)
PT Cancellation Note  Patient Details Name: [name] MRN: [id number] DOB: [birth year]   Cancelled Treatment:    Reason Eval/Treat Not Completed: Fatigue/lethargy limiting ability to participate PT will continue to follow acutely.    Salina April, PTA Pager: [phone]   12/11/2017, 10:31 AM

## 2017-12-11 NOTE — Progress Notes (Signed)
Family Medicine Teaching Service Daily Progress Note Intern Pager: [phone]  Patient name: [name] Medical record number: [id number] Date of birth: 1940/06/27 Age: 77 y.o. Gender: female  Primary Care Provider: Everrett Coombe, MD Consultants: Neuro, palliative, psychiatry Code Status: Full Code  Pt Overview and Major Events to Date:  8/19 admitted for AMS, found down up to 24 hours 8/22 given ativan and Phenytoin for clinical seizure activity noted around 11 AM 8/28 cortatrak placed by IR  Assessment and Plan: [name] T Reavesis a 77 y.o.femalepresenting with AMS and being found down with last known normal on Sunday 2:30pm likely due to seizure at home. PMH is significant forHCC, Schizoaffective Disorder,HxSeizure, Chronic D CHF, HLD, HTN, CKD III, GERD.   Seizures - Neurology following. Patient currently considered status epilepticus. She is being treated with Vimpat, Keppra, Dilantin. She opens eyes to verbal stimuli. Somnolence due to seizures vs sedative effects from seizure medications.  Current plan is to continue monitoring to see if her mental status begins to improve with new treatment plan. Valproic acid level 27 this morning which is subtherapeutic. Ammonia level is elevated to 55 today. AST 136, ALT, 223, Alk phos 196. So valproic acid was d/cd - Neurology following -added topomax yesterday -started ativan yesterday and decreased frequency due to sedative effect -added valproate yesterday and was d/c - continuous EEG - monitor on telemetry - d/c dilantin (phenytoin) yesterday - continue Keppra, vimpat  - seizure precautions - cortrak placed8/27, continue enteral feeds -ordered ammonia, LFT, and Depakote levels today - Patient's IV was pulled out on exam today- notified nurse who paged IV team  Electrolyte depletion- magnesium s/p repletion is normal. Phosphorus s/p repletion is slightly low at 2.4, potasium s/p repletion is normal at 4.0 -Patient given magnesium and  Phos IV -66mq K IV, with 468m oral per tube yesterday  AMS -awake this AM, blinking. Remains aphasic and does not follow commands. AMS thought to be due to seizure activity/post-ictal/multiple sedating meds for seizure.  -  Seizure management as above - cortrak placement 8/27 for enteral feeds  Schizoeffective/Bipolar disorder - previously well-controlled on depakote and risperdol, however depakote had been titrated down due to elevated LFTs which were most likely abnormal [facility] if current [facility]MS is new baseline. - holding home Risperdal for ongoing AMS - plan to re-consult psych for additional assistance in med management if patient becomes able to participate in the interview  Fall at Home, unwitnessed. Seems most likely due to seizure. Patient underwent trauma workup including R tib/fib XR, R Humerus and elbow XR, CXR which were all negative for bony abnormalities.  - avoid sedating pain medications d/t AMS  Leukocytosis, resolved - likely was due to ongoing seizure activity as no seizure activity noted on EEG for the past couple of days. No evidence of infection, no fevers, vitals stable.  - did not check CBC today, has been stable over past few days  Known Hepatocellular Carcinoma: Confirmed by Biopsy 2018  ALT/AST slightly increased from yesterday (AST 135, ALT 196). Patient has expressed not wanting treatment. - patient made DNI/DNR during palliative meeting on 8/26  HTN - elevated overnight with max SBP 170-190. She was not given PRN hydral. On home diltiazem 60 mg q8H but hasn't been able to take due to NPO. Can consider adding low dose IV if remains NPO. - home dilt is ordered but she will be unable to take it while NPO - PRN q4H hydral 67m51mV systolic >16>428iastolic >10>768  HLD No home statin. Has chronically elevated LFTs 2/2 HCC. On ASA 44m at home. - cont home ASA 866m CKD III Cr 1.22 on admission. Creatinine today is 1.02. Continues with IV fluids. - cont  to monitor BMP  GERD Protonix at home. - started protonix in oral suspension  Anemia -  BL between 10-12. Hgb Stable at baseline, monitored daily. Consider anemia workup when patient is not acutely ill, possibly on outpatient side. Hgb 10.2 today  FEN/GI:NPO Prophylaxis:Heparin  Disposition: Continue inpatient management of seizure activity, palliative meeting today  Subjective:  Overnight: No acute events. Today: Patient opens eyes to voice. Does not follow commands. Does not indicate that she is in pain when asked.  Objective: Temp:  [97.6 F (36.4 C)-98.6 F (37 C)] 97.8 F (36.6 C) (08/28 2344) Pulse Rate:  [69-83] 83 (08/29 0407) Resp:  [17-19] 17 (08/29 0407) BP: (124-160)/(58-87) 160/80 (08/29 0407) SpO2:  [99 %-100 %] 100 % (08/29 0407) Weight:  [60.2 kg] 60.2 kg (08/28 2344)  Physical Exam: General: NAD, rests in bed, opens eyes and blinks to verbal Cardiovascular: RRR, no m/r/g Respiratory: CTA bil anteriorly Abdomen: soft, non-distended, no HSM Extremities: 2+ pitting edema bilateral lower extremities. Patient's midline catheter was pulled out. I notified nurse who paged the IV team to correct Neuro: does not follow commands. Pupils equal and reactive.   Laboratory: Recent Labs  Lab 12/07/17 0633 12/08/17 0831 12/11/17 0244  WBC 11.4* 8.6 7.5  HGB 10.7* 10.8* 10.2*  HCT 34.5* 34.3* 32.5*  PLT 285 244 261   Recent Labs  Lab 12/07/17 0633 12/08/17 0831 12/10/17 0425 12/11/17 0244  NA 147* 146* 143 142  K 4.0 3.9 3.4* 4.0  CL 113* 112* 112* 113*  CO2 25 24 23 22   BUN 18 19 13 14   CREATININE 1.02* 1.05* 0.91 1.02*  CALCIUM 9.6 9.5 8.9 8.9  PROT 6.0* 5.9*  --  5.5*  BILITOT 0.7 0.8  --  0.5  ALKPHOS 154* 159*  --  196*  ALT 174* 196*  --  223*  AST 108* 135*  --  136*  GLUCOSE 152* 138* 139* 179*   Imaging/Diagnostic Tests: Continuous EEG   AnRicharda OsmondDO 12/11/2017, 6:04 AM PGY-1, [place]  pager: [phone] pages welcome

## 2017-12-11 NOTE — Progress Notes (Signed)
OT Cancellation Note  Patient Details Name: CAPRISHA BRIDGETT MRN: [id number] DOB: 1940/05/30   Cancelled Treatment:    Reason Eval/Treat Not Completed: Fatigue/lethargy limiting ability to participate.  Per palliative notes, family may transition pt to comfort care.  Will monitor for appropriateness of OT.  [place], OTR/L [phone]   Lucille Passy M 12/11/2017, 2:07 PM

## 2017-12-12 DIAGNOSIS — R569 Unspecified convulsions: Secondary | ICD-10-CM

## 2017-12-12 DIAGNOSIS — R52 Pain, unspecified: Secondary | ICD-10-CM

## 2017-12-12 DIAGNOSIS — Z7189 Other specified counseling: Secondary | ICD-10-CM

## 2017-12-12 LAB — GLUCOSE, CAPILLARY
GLUCOSE-CAPILLARY: 130 mg/dL — AB (ref 70–99)
GLUCOSE-CAPILLARY: 131 mg/dL — AB (ref 70–99)
GLUCOSE-CAPILLARY: 138 mg/dL — AB (ref 70–99)

## 2017-12-12 LAB — COMPREHENSIVE METABOLIC PANEL
ALK PHOS: 180 U/L — AB (ref 38–126)
ALT: 225 U/L — ABNORMAL HIGH (ref 0–44)
AST: 159 U/L — ABNORMAL HIGH (ref 15–41)
Albumin: 2 g/dL — ABNORMAL LOW (ref 3.5–5.0)
Anion gap: 6 (ref 5–15)
BILIRUBIN TOTAL: 0.4 mg/dL (ref 0.3–1.2)
BUN: 18 mg/dL (ref 8–23)
CALCIUM: 8.8 mg/dL — AB (ref 8.9–10.3)
CO2: 23 mmol/L (ref 22–32)
CREATININE: 1.02 mg/dL — AB (ref 0.44–1.00)
Chloride: 113 mmol/L — ABNORMAL HIGH (ref 98–111)
GFR, EST AFRICAN AMERICAN: 59 mL/min — AB (ref >60)
GFR, EST NON AFRICAN AMERICAN: 51 mL/min — AB (ref >60)
Glucose, Bld: 134 mg/dL — ABNORMAL HIGH (ref 70–99)
Potassium: 3.5 mmol/L (ref 3.5–5.1)
Sodium: 142 mmol/L (ref 135–145)
TOTAL PROTEIN: 5.1 g/dL — AB (ref 6.5–8.1)

## 2017-12-12 LAB — CBC
HCT: 28.5 % — ABNORMAL LOW (ref 36.0–46.0)
Hemoglobin: 9.2 g/dL — ABNORMAL LOW (ref 12.0–15.0)
MCH: 25.9 pg — ABNORMAL LOW (ref 26.0–34.0)
MCHC: 32.3 g/dL (ref 30.0–36.0)
MCV: 80.3 fL (ref 78.0–100.0)
PLATELETS: 300 10*3/uL (ref 150–400)
RBC: 3.55 MIL/uL — AB (ref 3.87–5.11)
RDW: 18.9 % — ABNORMAL HIGH (ref 11.5–15.5)
WBC: 8.2 10*3/uL (ref 4.0–10.5)

## 2017-12-12 LAB — PHOSPHORUS: PHOSPHORUS: 2.8 mg/dL (ref 2.5–4.6)

## 2017-12-12 LAB — MAGNESIUM: Magnesium: 1.9 mg/dL (ref 1.7–2.4)

## 2017-12-12 MED ORDER — MORPHINE SULFATE (PF) 2 MG/ML IV SOLN
1.0000 mg | INTRAVENOUS | Status: DC | PRN
  Administered 2017-12-14 – 2017-12-17 (×4): 2 mg via INTRAVENOUS
  Administered 2017-12-17: 1 mg via INTRAVENOUS
  Administered 2017-12-18 – 2017-12-21 (×5): 2 mg via INTRAVENOUS
  Filled 2017-12-12 (×11): qty 1

## 2017-12-12 MED ORDER — LORAZEPAM 2 MG/ML IJ SOLN
1.0000 mg | INTRAMUSCULAR | Status: DC
  Administered 2017-12-12 – 2017-12-21 (×49): 1 mg via INTRAVENOUS
  Filled 2017-12-12 (×49): qty 1

## 2017-12-12 MED ORDER — GLYCOPYRROLATE 0.2 MG/ML IJ SOLN: 0.2000 mg | INTRAMUSCULAR | Status: DC | PRN

## 2017-12-12 MED ORDER — LORAZEPAM 2 MG/ML IJ SOLN
1.0000 mg | INTRAMUSCULAR | Status: DC | PRN
  Filled 2017-12-12 (×2): qty 1

## 2017-12-12 MED ORDER — LORAZEPAM 2 MG/ML IJ SOLN: 0.5000 mg | Freq: Four times a day (QID) | INTRAMUSCULAR | Status: DC

## 2017-12-12 NOTE — Consult Note (Signed)
Reason for consult: Status epilepticus  Subjective: She is lethargic.  With sternal rub, opens her eyes with preference towards the left side.  No longer tracking.  ROS: negative except above   Examination  Vital signs in last 24 hours: Temp:  [97.7 F (36.5 C)-99.4 F (37.4 C)] 98.2 F (36.8 C) (08/30 0819) Pulse Rate:  [83-102] 98 (08/30 0819) Resp:  [14-18] 17 (08/30 0819) BP: (117-158)/(61-79) 140/69 (08/30 0819) SpO2:  [98 %-100 %] 98 % (08/30 0819)  General: Not in distress, cooperative CVS: pulse-normal rate and rhythm RS: breathing comfortably Extremities: normal   Neuro: Mental status: Lethargic, nonverbal, once to noxious stimuli CN: Pupils equal and reactive, no nystagmus, no obvious facial symmetry Motor exam: Withdraws on the left side, flaccid on the right side   Basic Metabolic Panel: Recent Labs  Lab 12/07/17 0633 12/08/17 0831 12/10/17 0425 12/11/17 0244 12/12/17 0349  NA 147* 146* 143 142 142  K 4.0 3.9 3.4* 4.0 3.5  CL 113* 112* 112* 113* 113*  CO2 25 24 23 22 23   GLUCOSE 152* 138* 139* 179* 134*  BUN 18 19 13 14 18   CREATININE 1.02* 1.05* 0.91 1.02* 1.02*  CALCIUM 9.6 9.5 8.9 8.9 8.8*  MG  --   --  1.6* 2.1 1.9  PHOS  --   --  2.6 2.4* 2.8    CBC: Recent Labs  Lab 12/06/17 0554 12/07/17 0633 12/08/17 0831 12/11/17 0244 12/12/17 0349  WBC 14.2* 11.4* 8.6 7.5 8.2  NEUTROABS  --  8.6*  --   --   --   HGB 10.8* 10.7* 10.8* 10.2* 9.2*  HCT 33.5* 34.5* 34.3* 32.5* 28.5*  MCV 79.4 81.2 80.3 81.5 80.3  PLT 286 285 244 261 300     Coagulation Studies: No results for input(s): LABPROT, INR in the last 72 hours.  This is day 8 of intensive EEG monitoring is abnormal and marked by more frequent left hemispheric spike and wave discharges and electrographic seizures arising from left hemisphere throughout the recording.  These findings were discussed with ordering team at the time of interpretation.    ASSESSMENT AND PLAN  77 year old  female with epilepsy who presented with 3 seizuresafter being tapered offvalproic acid due to recent liver cancer.She was started Keppra in-house and initial EEG was normal but then developed partial complex status on 8/22 with continuous right-sided twitching and left hemispheric EEG correlation. MRI brain finding showed subtle gyriform diffusion abnormality involving the parasagittal left parietal occipital region and possibly left thalamus - likely from status epilepticus.  Has continued to have intermittent seizures, waxing and waning epileptiform discharges from the left hemisphere without return to baseline despite multiple AEDs.   Refractory Partial Status epilepticus   -No control of status epilepticus despite 4 AEDs and benzos.  Had some improvement with valproic acid load last night, however again having electrographic seizures this morning. -Discussed with family, to pursue comfort care. -D/C EEG -gradually taper seizure medications while transition to comfort care -Palliative care   Kenae Lindquist [facility] Pager Number [phone] For questions after 7pm please refer to AMION to reach the Neurologist on call

## 2017-12-12 NOTE — Consult Note (Signed)
            [facility] CM Primary Care Navigator  12/12/2017  [name] [birth year] [id number]   Martin Majestic to see patient at the bedside to identify possible discharge needs butnoted onchart per Palliative care note 8/29, that patient will likely transition to Hospice.   Per chart review, patient presented with altered mental status and found down likely due to seizure at home.(considered status epilepticus)   Patient is DNR and per palliative consult, patient will likely transition to residential hospice. Per MD note, patient continues to have seizure-like activity on EEG, continue inpatient management of seizure activity and possibly transitioning to comfort care today.  Nofurtheridentifiable [facility] Care Management needsat this point.   For additional questions please contact:  [name], BSN, RN-BC [facility] PRIMARY CARE Navigator Cell: [phone]

## 2017-12-12 NOTE — Progress Notes (Signed)
Family Medicine Teaching Service Daily Progress Note Intern Pager: [phone]  Patient name: [name] Medical record number: [id number] Date of birth: 02/07/1941 Age: 77 y.o. Gender: female  Primary Care Provider: Everrett Coombe, MD Consultants: Neuro, palliative, psychiatry Code Status: Full Code  Pt Overview and Major Events to Date:  8/19 admitted for AMS, found down up to 24 hours 8/22 given ativan and Phenytoin for clinical seizure activity noted around 11 AM 8/28 cortatrak placed by IR 8/30 possibly transitioned to comfort care  Assessment and Plan: [name] T Reavesis a 77 y.o.femalepresenting with AMS and being found down with last known normal on Sunday 2:30pm likely due to seizure at home. PMH is significant forHCC, Schizoaffective Disorder,HxSeizure, Chronic D CHF, HLD, HTN, CKD III, GERD.   Seizures - Neurology following. Patient currently considered status epilepticus. She is being treated with Vimpat, Keppra, valproic acid, ativan . She opens eyes to verbal stimuli. Somnolence due to seizures vs sedative effects from seizure medications.  Current plan is to continue monitoring to see if her mental status begins to improve with new treatment plan. Valproic acid level 27 yesterday which is subtherapeutic. Patient continues to have seizure like activity on EEG- possibly transitioning to comfort care today. - Neurology following -continue ativan, valproate, topomax, vimpat, Keppra - continuous EEG - monitor on telemetry - seizure precautions - cortrak placed8/27, continue enteral feeds - D/c'ed her IV fluids yesterday which improved her LE edema  Electrolyte depletion- s/p repletion of phosphorus, magnesium, potassium. Potassium 3.5 today, Mg 1.9, Phos 2.8 -continue to monitor for symptoms of abnormal electrolyte balances -discontinue checks if patient transitioned to comfort care  AMS -slightly opened eyes to sternal rub. Remains aphasic and does not follow commands. AMS  thought to be due to seizure activity/post-ictal/multiple sedating meds for seizure.  -  Seizure management as above - cortrak placement 8/27 for enteral feeds  Schizoeffective/Bipolar disorder  - holding home Risperdal for ongoing AMS - plan to re-consult psych for additional assistance in med management if patient becomes able to participate in the interview  Fall at Home, unwitnessed. R tib/fib XR, R Humerus and elbow XR, CXR which were all negative for bony abnormalities.   Leukocytosis, resolved -8.2 today - did not check CBC today, has been stable over past few days  Known Hepatocellular Carcinoma: Confirmed by Biopsy 2018  Patient has expressed not wanting treatment to PCP - patient made DNI/DNR during palliative meeting on 8/26  HTN -  - home dilt - PRN q4H hydral 5mg  IV systolic >518, diastolic >841  HLD No home statin. Has chronically elevated LFTs 2/2 HCC. On ASA 81mg  at home. - cont home ASA 81mg   CKD III Cr 1.22 on admission. Creatinine today is 1.02. Discontinued IV fluids yesterday d/t LE edema - cont to monitor BMP  GERD Protonix at home. - started protonix in oral suspension  Anemia -  BL between 10-12. Hgb Stable at baseline, monitored daily. Consider anemia workup when patient is not acutely ill, possibly on outpatient side. Hgb 9.2 today  FEN/GI:NPO Prophylaxis:Heparin  Disposition: Continue inpatient management of seizure activity, possible transition to comfort care today  Subjective:  Overnight: No acute events. Today: Patient opens eyes briefly to sternal rub. Does not follow commands. Does not indicate that she is in pain when asked.  Objective: Temp:  [97.7 F (36.5 C)-99.4 F (37.4 C)] 97.7 F (36.5 C) (08/30 0409) Pulse Rate:  [83-102] 92 (08/30 0409) Resp:  [14-18] 18 (08/30 0409) BP: (117-158)/(60-79) 145/62 (08/30  0409) SpO2:  [98 %-100 %] 98 % (08/30 0409)  Physical Exam: General: NAD, rests in bed, opens eyes and blinks to  verbal Cardiovascular: RRR, no m/r/g Respiratory: CTA bil anteriorly Abdomen: soft, non-distended, no HSM Extremities: 1+ pitting edema bilateral feet Neuro: does not follow commands. Pupils equal and reactive.   Laboratory: Recent Labs  Lab 12/08/17 0831 12/11/17 0244 12/12/17 0349  WBC 8.6 7.5 8.2  HGB 10.8* 10.2* 9.2*  HCT 34.3* 32.5* 28.5*  PLT 244 261 300   Recent Labs  Lab 12/08/17 0831 12/10/17 0425 12/11/17 0244 12/12/17 0349  NA 146* 143 142 142  K 3.9 3.4* 4.0 3.5  CL 112* 112* 113* 113*  CO2 24 23 22 23   BUN 19 13 14 18   CREATININE 1.05* 0.91 1.02* 1.02*  CALCIUM 9.5 8.9 8.9 8.8*  PROT 5.9*  --  5.5* 5.1*  BILITOT 0.8  --  0.5 0.4  ALKPHOS 159*  --  196* 180*  ALT 196*  --  223* 225*  AST 135*  --  136* 159*  GLUCOSE 138* 139* 179* 134*   Imaging/Diagnostic Tests: Continuous EEG   [name] Osmond, DO 12/12/2017, 6:56 AM PGY-1, [place] Intern pager: [phone], text pages welcome

## 2017-12-12 NOTE — Progress Notes (Signed)
Daily Progress Note   Patient Name: [name]       Date: 12/12/2017 DOB: 10-Feb-1941  Age: 77 y.o. MRN#: [id number] Attending Physician: Lind Covert, MD Primary Care Physician: Everrett Coombe, MD Admit Date: 11/13/2017  Reason for Consultation/Follow-up: Establishing goals of care, Non pain symptom management, Pain control, Psychosocial/spiritual support and Terminal Care  Subjective: Patient seen, chart reviewed.  Daughters, Barnett Applebaum and [place],  as well as patient's brother, Mr. Valentino Nose, are at the bedside.  Patient is on day 8 of EEG monitoring and despite maximizing anticonvulsants, is still having seizures.  Family is now electing to go forward with comfort care with continued seizure management for comfort.  Length of Stay: 11  Current Medications: Scheduled Meds:  . ammonium lactate   Topical BID  . lidocaine  15 mL Mouth/Throat Once  . LORazepam  1 mg Intravenous Q4H  . mouth rinse  15 mL Mouth Rinse BID    Continuous Infusions: . lacosamide (VIMPAT) IV 200 mg (12/12/17 1016)  . levETIRAcetam 1,500 mg (12/11/17 2322)  . valproate sodium 250 mg (12/12/17 0554)    PRN Meds: glycopyrrolate, hydrALAZINE, LORazepam, morphine injection, sodium chloride flush  Physical Exam  Constitutional:  Acutely ill-appearing elderly female; minimally responsive  HENT:  Head: Normocephalic and atraumatic.  Cardiovascular: Normal rate.  Pulmonary/Chest: Effort normal.  Genitourinary:  Genitourinary Comments: foley  Neurological:  Minimally responsive  Skin: Skin is warm and dry.  Psychiatric:  No overt agitation otherwise unable to test  Nursing note and vitals reviewed.           Vital Signs: BP 113/85 (BP Location: Left Arm)   Pulse 93   Temp 97.9 F (36.6 C)  (Axillary)   Resp 16   Ht 5\' 3"  (1.6 m)   Wt 60.2 kg   SpO2 98%   BMI 23.51 kg/m  SpO2: SpO2: 98 % O2 Device: O2 Device: Room Air O2 Flow Rate:    Intake/output summary:   Intake/Output Summary (Last 24 hours) at 12/12/2017 1424 Last data filed at 12/12/2017 1100 Gross per 24 hour  Intake 5682.31 ml  Output 1150 ml  Net 4532.31 ml   LBM: Last BM Date: 12/11/17 Baseline Weight: Weight: 60.2 kg Most recent weight: Weight: 60.2 kg       Palliative Assessment/Data:  Flowsheet Rows     Most Recent Value  Intake Tab  Unit at Time of Referral  ER  Palliative Care Primary Diagnosis  Cancer  Date Notified  12/02/17  Palliative Care Type  New Palliative care  Reason for referral  Clarify Goals of Care  Date of Admission  11/18/2017  Date first seen by Palliative Care  12/03/17  # of days Palliative referral response time  1 Day(s)  # of days IP prior to Palliative referral  1  Clinical Assessment  Psychosocial & Spiritual Assessment  Palliative Care Outcomes      Patient Active Problem List   Diagnosis Date Noted  . Medication course changed   . DNR (do not resuscitate)   . Hypernatremia   . Status epilepticus ([place])   . Palliative care by specialist   . DNR (do not resuscitate) discussion   . Altered mental status   . Weakness 11/30/2017  . Abnormal LFTs 11/24/2017  . Hepatocellular carcinoma ([place]) 06/11/2017  . Chronic diastolic CHF (congestive heart failure) ([place]) 10/10/2014  . Palpitations 10/10/2014  . GERD (gastroesophageal reflux disease) 12/30/2013  . Normal coronary arteries 11/09/13 11/10/2013  . Fall 12/12/2011  . SCHIZOAFFECTIVE DISORDER 11/28/2009  . ANEMIA, NORMOCYTIC, CHRONIC 11/07/2008  . GOUT 05/25/2008  . CHRONIC KIDNEY DISEASE STAGE III (MODERATE) 08/27/2007  . HYPERCHOLESTEROLEMIA 06/12/2006  . BIPOLAR DISORDER 06/12/2006  . Essential hypertension 06/12/2006  . Seizures ([place]) 06/12/2006    Palliative Care Assessment & Plan   Patient  Profile: 77 year old female with epilepsy who presented with 3 seizuresafter being tapered offvalproic acid due to recent liver cancer.She was started Keppra in-house and initial EEG was normal but then developed partial complex status on 8/22 with continuous right-sided twitching and left hemispheric EEG correlation. MRI brain finding showed subtle gyriform diffusion abnormality involving the parasagittal left parietal occipital region and possibly left thalamus - likely from status epilepticus.  Has continued to have intermittent seizures, waxing and waning epileptiform discharges from the left hemisphere without return to baseline despite multiple AEDs.  Consult ordered for goals of care.  Family has now elected comfort care   Recommendations/Plan:  DC EEG monitoring  DC core track  We will DC Topamax but continue Vimpat and Keppra  We will add scheduled Ativan 1 mg every 4 hours as needed with 1 to 2 mg every 15 minutes for seizure activity  DC telemetry  PRN Robinul added if patient should develop secretions  Transfer to 6N  Patient would be eligible for residential hospice however residential hospice does not have the capability of providing IV anticonvulsants for comfort except for benzodiazepines.  Family is concerned that this will be inadequate to manage underlying seizures and are concerned about altering her medication profile to such a degree.  They are hopeful to remain in the hospital for end-of-life care  Goals of Care and Additional Recommendations:  Limitations on Scope of Treatment: Full Comfort Care  Code Status:    Code Status Orders  (From admission, onward)         Start     Ordered   12/08/17 1329  Do not attempt resuscitation (DNR)  Continuous    Question Answer Comment  In the event of cardiac or respiratory ARREST Do not call a "code blue"   In the event of cardiac or respiratory ARREST Do not perform Intubation, CPR, defibrillation or ACLS    In the event of cardiac or respiratory ARREST Use medication by any route, position, wound care,  and other measures to relive pain and suffering. May use oxygen, suction and manual treatment of airway obstruction as needed for comfort.      12/08/17 1328        Code Status History    Date Active Date Inactive Code Status Order ID Comments User Context   12/05/2017 2241 12/08/2017 1328 Full Code [id number]  Cleophas Dunker, DO ED   02/03/2015 1241 02/04/2015 2153 Full Code [id number]  Frazier Richards, MD Inpatient   11/09/2013 1958 11/10/2013 1632 Full Code [id number]  Leonie Man, MD Inpatient   11/07/2013 1125 11/09/2013 1958 Full Code [id number]  Orson Eva, MD Inpatient   01/25/2013 1746 01/28/2013 1153 Full Code [id number]  Mirna Mires, MD ED   01/12/2013 0402 01/18/2013 2022 Full Code [id number]  Leone Haven, MD Inpatient       Prognosis:   Hours - Days  Discharge Planning:  Anticipated Hospital Death  Care plan was discussed with Shea Stakes, RN  Thank you for allowing the Palliative Medicine Team to assist in the care of this patient.   Time In: 1330 Time Out: 1415 Total Time 29min Prolonged Time Billed  no       Greater than 50%  of this time was spent counseling and coordinating care related to the above assessment and plan.  Dory Horn, NP  Please contact Palliative Medicine Team phone at [phone] for questions and concerns.

## 2017-12-12 NOTE — Progress Notes (Signed)
Pt transfer from 2w flat affect on seizure precaution, purewick, room air, comfort care, no s/s of distress at this time.

## 2017-12-12 NOTE — Progress Notes (Signed)
LTM EEG checked, no skin breakdown noted.Cz, Fz, C3, F7 all had paste added

## 2017-12-12 NOTE — Procedures (Signed)
Electroencephalogram report- LTM  Ordering Physician : Cheral Marker, MD    Beginning date and time: 12/11/2017 07 30 am  Ending date and time: 12/12/2017 07 30 am   Day of study: 8 Cpt 95951  Medications include: Depakote, Keppra, Vimpat   This intensive EEG monitoring with simultaneous video monitoring was performed for this patient with right-sided twitching and altered mental status.  This EEG was requested to rule out subclinical electrographic seizures.  Data acquisition:  The study consists of a continuous 16-channel multi-montage digital video EEG recording with twenty-one electrodes placed according to the International 10-20 System. Additional leads included eye leads, true temporal leads (T1, T2), and an EKG lead. Activation procedures were not done due to mental status.  Day 2: Background activities marked by predominantly theta slowing ranging between 5 to 7 cps distributed broadly without well-defined posterior dominance.  Superimposed to very frequent left hemispheric sharp waves and spikes present maximum negativity mostly at C3 P3 negative field extending to posterior temporal and occipital cortex at times.  Also independent in the frequent left temporal spikes present.  In addition there is a right parietal sharp waves present however not as frequently as interictal epileptiform discharges across left hemisphere.  No clinical subclinical seizures present.  Day 3: No clinical or  subclinical seizures present.  Background activities marked by slowing and disorganization unchanged from previously recorded.  Superimposed multifocal sharp waves and spikes present throughout the recording however less prominent and not as frequent as during previous days of the recording.  Day 4: Background activities marked by continues background activity slowing and disorganization.  Superimposed multifocal sharp waves present independently involving the left and right hemisphere.  Left hemispheric sharp  waves and spikes appear to be more frequent as recording progress.  During second half of the recording a left hemispheric spike and wave discharges appear in short rhythmic trains however without evolution to suggest discrete electrographic seizures   Day 5: Continues background activity slowing and disorganization essentially unchanged from previously recorded.  Sharp waves and spikes was seen predominantly across left hemisphere maximum negativity in the left anterior cortex.  Spike and wave discharges frequently seen in the short trains.  On one occasion around 6 AM patient had event accompanied by repetitive blinking and chewing.  Such movement and muscle artifact obscured EEG tracing however when artifact subsided there is a rhythmic synchronized waxing and waning left hemispheric spike and wave discharges present.  Based on the above findings seizure cannot be ruled out.  Day 6: Background activities marked by continuous background activity slowing and disorganization.  Superimposed fairly frequent left hemispheric sharp waves and spikes involving mostly left central parietal frontocentral and at times anterior temporal region.  At times such left anterior spikes occur in periodic fashion in short trains however without evolution to suggest seizures.  Multiple episodic eye blinking were present and were not accompanied by any obvious EEG changes to suggest seizures.  Day 7: Left hemispheric sharp waves become more frequent involving mostly posterior left hemispheric cortex.  At times occur in a rhythmic trains.  Pushbutton was not noted for events accompanied by some eyes blinking.  Video was suboptimal.  On one occasion it is also noted that she raised her left arm slightly the rest of the clinical manifestations not available.  I movements artifact were noted on EEG.  When artifact subside rhythmic left hemispheric near continuous spike and wave and sharp waves present.  However at times shorter  duration eye blinking events  did not have any well-demarcated EEG changes.  Day 8: Left hemispheric spike and wave discharges become frequent synchronized rhythmic with evolving features intermittently throughout the recording and most consistent with subclinical electrographic seizures.  Majority of electrographic seizures are not accompanied by any clinical demarcation.  Electrographic seizures become less frequent throughout the night however recur during second half of the recording.  Intermittently there is occasional right posterior sharp waves also present  Clinical interpretation: This is day 8 of intensive EEG monitoring is abnormal and marked by more frequent left hemispheric spike and wave discharges and electrographic seizures arising from left hemisphere throughout the recording.  These findings were discussed with ordering team at the time of interpretation.

## 2017-12-13 DIAGNOSIS — K117 Disturbances of salivary secretion: Secondary | ICD-10-CM

## 2017-12-13 DIAGNOSIS — Z515 Encounter for palliative care: Secondary | ICD-10-CM

## 2017-12-13 MED ORDER — GLYCOPYRROLATE 0.2 MG/ML IJ SOLN
0.2000 mg | Freq: Three times a day (TID) | INTRAMUSCULAR | Status: DC
  Administered 2017-12-13 – 2017-12-21 (×22): 0.2 mg via INTRAVENOUS
  Filled 2017-12-13 (×23): qty 1

## 2017-12-13 NOTE — Progress Notes (Signed)
Family Medicine Teaching Service Daily Progress Note Intern Pager: [phone]  Patient name: [name] Medical record number: [id number] Date of birth: 1940-11-02 Age: 77 y.o. Gender: female  Primary Care Provider: Everrett Coombe, MD Consultants: Neuro, palliative, psychiatry Code Status: Full Code  Pt Overview and Major Events to Date:  8/19 admitted for AMS, found down up to 24 hours 8/22 given ativan and Phenytoin for clinical seizure activity noted around 11 AM 8/28 cortatrak placed by IR 8/30 possibly transitioned to comfort care  Assessment and Plan: [name] T Reavesis a 77 y.o.femalepresenting with AMS and being found down with last known normal on Sunday 2:30pm likely due to seizure at home. PMH is significant forHCC, Schizoaffective Disorder,HxSeizure, Chronic D CHF, HLD, HTN, CKD III, GERD.   Seizures - transitioned to comfort care yesterday. Tapering off anticonvulsants - Neurology following -topomax discontinued yesterday -continue ativan scheduled and PRN, valproate,vimpat, Keppra and continue to wean off -added scheduled morphine - discontinued EEG - discontinued telemetry -daily vitals - cortrak placed8/27- d/c on 8/30  Electrolyte depletion- no longer monitoring on comfort care  AMS -eyes open to verbal stimuli. Remains aphasic and does not follow commands.  -  Comfort measures  Schizoeffective/Bipolar disorder   Fall at Home  Leukocytosis, resolved d/c monitoring  Known Hepatocellular Carcinoma: Confirmed by Biopsy 2018  Patient has expressed not wanting treatment to PCP - patient made DNI/DNR during palliative meeting on 8/26  HTN- stable and within normal limits -d/ced PRN hydral  HLD  CKD III no longer monitoring  GERD Protonix at home. - treat as needed  Anemia -  No longer monitoring  FEN/GI:NPO Prophylaxis:Heparin  Disposition: Continue inpatient management of seizure activity with end of life care since hospice facility cannot  facilitate IV anti epileptic medications  Subjective:  Overnight: No acute events. Today: Patient opens eyes to verbal stimuli- unresponsive otherwise  Objective: Temp:  [97.9 F (36.6 C)-100.1 F (37.8 C)] 100.1 F (37.8 C) (08/31 0445) Pulse Rate:  [93-108] 108 (08/31 0445) Resp:  [16-26] 26 (08/30 1753) BP: (113-143)/(70-85) 143/70 (08/31 0445) SpO2:  [98 %-99 %] 98 % (08/31 0445)  Physical Exam: General: NAD, rests in bed, opens eyes to verbal Cardiovascular: RRR, systolic murmer  Respiratory: CTA bil anteriorly Abdomen: soft, non-distended, no HSM Extremities: 2+ pitting edema from feet to knee Neuro: does not follow commands. Or respond verbally  Laboratory: Recent Labs  Lab 12/08/17 0831 12/11/17 0244 12/12/17 0349  WBC 8.6 7.5 8.2  HGB 10.8* 10.2* 9.2*  HCT 34.3* 32.5* 28.5*  PLT 244 261 300   Recent Labs  Lab 12/08/17 0831 12/10/17 0425 12/11/17 0244 12/12/17 0349  NA 146* 143 142 142  K 3.9 3.4* 4.0 3.5  CL 112* 112* 113* 113*  CO2 24 23 22 23   BUN 19 13 14 18   CREATININE 1.05* 0.91 1.02* 1.02*  CALCIUM 9.5 8.9 8.9 8.8*  PROT 5.9*  --  5.5* 5.1*  BILITOT 0.8  --  0.5 0.4  ALKPHOS 159*  --  196* 180*  ALT 196*  --  223* 225*  AST 135*  --  136* 159*  GLUCOSE 138* 139* 179* 134*   Imaging/Diagnostic Tests: Continuous EEG d/c'd yesterday  Richarda Osmond, DO 12/13/2017, 9:28 AM PGY-1, [facility] Intern pager: [phone], text pages welcome

## 2017-12-13 NOTE — Progress Notes (Addendum)
Daily Progress Note   Patient Name: [name]       Date: 12/13/2017 DOB: [birth year]  Age: 77 y.o. MRN#: [id number] Attending Physician: Lind Covert, MD Primary Care Physician: Everrett Coombe, MD Admit Date: 11/23/2017  Reason for Consultation/Follow-up: Non pain symptom management, Pain control, Psychosocial/spiritual support and Terminal Care  Subjective: Patient seen, chart reviewed.  No family at the bedside currently.  No nonverbal signs and symptoms of pain.  No PRN morphine or breakthrough Ativan noted per MAR. Secretions noted but patient had just received mouth care.  Patient is unresponsive except with touch, turning.  Length of Stay: 12  Current Medications: Scheduled Meds:  . ammonium lactate   Topical BID  . glycopyrrolate  0.2 mg Intravenous Q8H  . lidocaine  15 mL Mouth/Throat Once  . LORazepam  1 mg Intravenous Q4H  . mouth rinse  15 mL Mouth Rinse BID    Continuous Infusions: . lacosamide (VIMPAT) IV 200 mg (12/13/17 0956)  . levETIRAcetam 1,500 mg (12/13/17 1119)  . valproate sodium 250 mg (12/13/17 1457)    PRN Meds: glycopyrrolate, LORazepam, morphine injection, sodium chloride flush  Physical Exam  Constitutional:  Acutely ill, frail elderly female.  Transitioning towards end-of-life  HENT:  Head: Normocephalic and atraumatic.  Temporal wasting  Eyes: Pupils are equal, round, and reactive to light. EOM are normal.  Cardiovascular:  Irregular  Pulmonary/Chest:  Poor respiratory effort  Genitourinary:  Genitourinary Comments: Purwick  Neurological:  Patient is unresponsive except when turned or with administration of ADLs  Skin: Skin is warm and dry.  Psychiatric:  Patient is unresponsive  Nursing note and vitals reviewed.            Vital Signs: BP (!) 143/70 (BP Location: Left Arm)   Pulse (!) 108   Temp 100.1 F (37.8 C) (Oral)   Resp (!) 26   Ht 5\' 3"  (1.6 m)   Wt 60.2 kg   SpO2 98%   BMI 23.51 kg/m  SpO2: SpO2: 98 % O2 Device: O2 Device: Room Air O2 Flow Rate:    Intake/output summary:   Intake/Output Summary (Last 24 hours) at 12/13/2017 1722 Last data filed at 12/13/2017 1500 Gross per 24 hour  Intake -  Output 800 ml  Net -800 ml   LBM: Last BM Date: 12/11/17  Baseline Weight: Weight: 60.2 kg Most recent weight: Weight: 60.2 kg       Palliative Assessment/Data:    Flowsheet Rows     Most Recent Value  Intake Tab  Unit at Time of Referral  ER  Palliative Care Primary Diagnosis  Cancer  Date Notified  12/02/17  Palliative Care Type  New Palliative care  Reason for referral  Clarify Goals of Care  Date of Admission  12/06/2017  Date first seen by Palliative Care  12/03/17  # of days Palliative referral response time  1 Day(s)  # of days IP prior to Palliative referral  1  Clinical Assessment  Psychosocial & Spiritual Assessment  Palliative Care Outcomes      Patient Active Problem List   Diagnosis Date Noted  . End of life care   . Generalized pain   . Medication course changed   . DNR (do not resuscitate)   . Hypernatremia   . Status epilepticus ([place])   . Palliative care by specialist   . Goals of care, counseling/discussion   . Altered mental status   . Weakness 12/10/2017  . Abnormal LFTs 11/24/2017  . Hepatocellular carcinoma ([place]) 06/11/2017  . Chronic diastolic CHF (congestive heart failure) ([place]) 10/10/2014  . Palpitations 10/10/2014  . GERD (gastroesophageal reflux disease) 12/30/2013  . Normal coronary arteries 11/09/13 11/10/2013  . Fall 12/12/2011  . SCHIZOAFFECTIVE DISORDER 11/28/2009  . ANEMIA, NORMOCYTIC, CHRONIC 11/07/2008  . GOUT 05/25/2008  . CHRONIC KIDNEY DISEASE STAGE III (MODERATE) 08/27/2007  . HYPERCHOLESTEROLEMIA 06/12/2006  . BIPOLAR DISORDER  06/12/2006  . Essential hypertension 06/12/2006  . Seizures ([place]) 06/12/2006    Palliative Care Assessment & Plan   Patient Profile: 77 year old female with epilepsy who presented with 3 seizuresafter being tapered offvalproic acid due to recent liver cancer.She was started Keppra in-house and initial EEG was normal but then developed partial complex status on 8/22 with continuous right-sided twitching and left hemispheric EEG correlation. MRI brain finding showed subtle gyriform diffusion abnormality involving the parasagittal left parietal occipital region and possibly left thalamus - likely from status epilepticus.  Has continued to have intermittent seizures,waxing and waning epileptiform discharges from the left hemispherewithout returnto baseline despite multiple AEDs.  Consult ordered for goals of care.  Family has now elected comfort care. Pt is transitioning towards EOL  Recommendations/Plan:  Pain: Continue with morphine on an as-needed basis.  Monitor for need for scheduled dosing  Secretions: Questionable mild upper airway secretions forming.  Will start low-dose scheduled Robinul at 0.2 mg IV every 8 hours with every 4 hours available for breakthrough.   Seizures: Continue with IV Vimpat, IV Depacon and scheduled Ativan 1 mg every 4 hours.  Goals of Care and Additional Recommendations:  Limitations on Scope of Treatment: Full Comfort Care  Code Status:    Code Status Orders  (From admission, onward)         Start     Ordered   12/08/17 1329  Do not attempt resuscitation (DNR)  Continuous    Question Answer Comment  In the event of cardiac or respiratory ARREST Do not call a "code blue"   In the event of cardiac or respiratory ARREST Do not perform Intubation, CPR, defibrillation or ACLS   In the event of cardiac or respiratory ARREST Use medication by any route, position, wound care, and other measures to relive pain and suffering. May use oxygen,  suction and manual treatment of airway obstruction as needed for comfort.  12/08/17 1328        Code Status History    Date Active Date Inactive Code Status Order ID Comments User Context   11/18/2017 2241 12/08/2017 1328 Full Code [id number]  Cleophas Dunker, DO ED   02/03/2015 1241 02/04/2015 2153 Full Code [id number]  Frazier Richards, MD Inpatient   11/09/2013 1958 11/10/2013 1632 Full Code [id number]  Leonie Man, MD Inpatient   11/07/2013 1125 11/09/2013 1958 Full Code [id number]  Orson Eva, MD Inpatient   01/25/2013 1746 01/28/2013 1153 Full Code [id number]  Mirna Mires, MD ED   01/12/2013 0402 01/18/2013 2022 Full Code [id number]  Leone Haven, MD Inpatient       Prognosis:   Hours - Days  Discharge Planning:  Anticipated Hospital Death   Thank you for allowing the Palliative Medicine Team to assist in the care of this patient.   Time In: 1500 Time Out: 1525 Total Time 25 min Prolonged Time Billed  no       Greater than 50%  of this time was spent counseling and coordinating care related to the above assessment and plan.  Dory Horn, NP  Please contact Palliative Medicine Team phone at [phone] for questions and concerns.

## 2017-12-14 NOTE — Plan of Care (Signed)
  Problem: Pain Managment: Goal: General experience of comfort will improve Outcome: Progressing   Problem: Safety: Goal: Ability to remain free from injury will improve Outcome: Progressing   Problem: Skin Integrity: Goal: Risk for impaired skin integrity will decrease Outcome: Progressing   

## 2017-12-14 NOTE — Progress Notes (Signed)
Family Medicine Teaching Service Daily Progress Note Intern Pager: [phone]  Patient name: [name] Medical record number: [id number] Date of birth: 1940/05/19 Age: 77 y.o. Gender: female  Primary Care Provider: Everrett Coombe, MD Consultants: Neuro, palliative, psychiatry Code Status: Full Code  Pt Overview and Major Events to Date:  8/19 admitted for AMS, found down up to 24 hours 8/22 given ativan and Phenytoin for clinical seizure activity noted around 11 AM 8/28 cortatrak placed by IR 8/30 possibly transitioned to comfort care  Assessment and Plan: [name] T Reavesis a 77 y.o.femalepresenting with AMS and being found down with last known normal on Sunday 2:30pm likely due to seizure at home. PMH is significant forHCC, Schizoaffective Disorder,HxSeizure, Chronic D CHF, HLD, HTN, CKD III, GERD.   Seizures - transitioned to comfort care on 8/30. Tapering off anticonvulsants - Neurology following -topomax discontinued yesterday -continue ativan scheduled and PRN, valproate,vimpat, Keppra and continue to wean off -added scheduled morphine - discontinued EEG - discontinued telemetry -daily vitals - cortrak placed 8/27- d/c on 8/30  Electrolyte depletion- no longer monitoring on comfort care  AMS -eyes open to verbal stimuli. Remains aphasic and does not follow commands.  -  Comfort measures  Schizoeffective/Bipolar disorder  - Comfort measures only  Leukocytosis, resolved d/c monitoring  Known Hepatocellular Carcinoma: Confirmed by Biopsy 2018  Patient has expressed not wanting treatment to PCP - patient made DNI/DNR during palliative meeting on 8/26  HTN- stable and within normal limits -d/ced PRN hydral  HLD - comfort measures only, no meds  CKD III no longer monitoring  GERD Protonix at home. - treat as needed  Anemia -  No longer monitoring  FEN/GI:NPO Prophylaxis:Heparin  Disposition: Continue inpatient management of seizure activity with end  of life care since hospice facility cannot facilitate IV anti epileptic medications  Subjective:  Overnight: No acute events overnight. Today: Patient asleep this am, will open eyes to verbal stimuli- unresponsive otherwise  Objective:   Physical Exam: General: NAD, resting in bed Cardiovascular: RRR, systolic murmur  Respiratory: CTAB no wheezing or crackles Abdomen: soft, non-distended Extremities: 2+ pitting edema bilaterally LE Neuro: does not follow commands, can open eyes to verbal stimuli  Laboratory: Recent Labs  Lab 12/08/17 0831 12/11/17 0244 12/12/17 0349  WBC 8.6 7.5 8.2  HGB 10.8* 10.2* 9.2*  HCT 34.3* 32.5* 28.5*  PLT 244 261 300   Recent Labs  Lab 12/08/17 0831 12/10/17 0425 12/11/17 0244 12/12/17 0349  NA 146* 143 142 142  K 3.9 3.4* 4.0 3.5  CL 112* 112* 113* 113*  CO2 24 23 22 23   BUN 19 13 14 18   CREATININE 1.05* 0.91 1.02* 1.02*  CALCIUM 9.5 8.9 8.9 8.8*  PROT 5.9*  --  5.5* 5.1*  BILITOT 0.8  --  0.5 0.4  ALKPHOS 159*  --  196* 180*  ALT 196*  --  223* 225*  AST 135*  --  136* 159*  GLUCOSE 138* 139* 179* 134*   Imaging/Diagnostic Tests: Continuous EEG d/c'd yesterday  Nuala Alpha, DO 12/14/2017, 5:52 AM PGY-2, [place] Intern pager: [phone], text pages welcome

## 2017-12-14 NOTE — Progress Notes (Signed)
Daily Progress Note   Patient Name: [name]       Date: 12/14/2017 DOB: 1941/03/29  Age: 77 y.o. MRN#: [id number] Attending Physician: Lind Covert, MD Primary Care Physician: Everrett Coombe, MD Admit Date: 12/11/2017  Reason for Consultation/Follow-up: Non pain symptom management, Pain control, Psychosocial/spiritual support and Terminal Care  Subjective: Patient seen, chart reviewed.  No nonverbal signs and symptoms of discomfort or shortness of breath.  No family at the bedside.  Per chart review, no PRN morphine or Ativan required overnight to maintain comfort.  Secretions decreased with scheduled Robinul.  No seizure activity noted  Length of Stay: 13  Current Medications: Scheduled Meds:  . ammonium lactate   Topical BID  . glycopyrrolate  0.2 mg Intravenous Q8H  . lidocaine  15 mL Mouth/Throat Once  . LORazepam  1 mg Intravenous Q4H  . mouth rinse  15 mL Mouth Rinse BID    Continuous Infusions: . lacosamide (VIMPAT) IV 200 mg (12/14/17 1020)  . levETIRAcetam 1,500 mg (12/14/17 1148)  . valproate sodium 250 mg (12/14/17 0631)    PRN Meds: glycopyrrolate, LORazepam, morphine injection, sodium chloride flush  Physical Exam  Constitutional:  Acutely ill, cachectic, elderly female.  She appears to be actively dying  HENT:  Temporal wasting  Cardiovascular: Normal rate.  Pulmonary/Chest:  Weak respiratory effort.  Decreased secretions No apnea noted  Neurological:  Unresponsive  Skin: Skin is warm and dry.  Psychiatric:  Unable to test  Nursing note and vitals reviewed.           Vital Signs: BP (!) 162/81 (BP Location: Left Arm)   Pulse (!) 102   Temp 97.6 F (36.4 C) (Axillary)   Resp (!) 26   Ht 5\' 3"  (1.6 m)   Wt 60.2 kg   SpO2 100%   BMI 23.51  kg/m  SpO2: SpO2: 100 % O2 Device: O2 Device: Room Air O2 Flow Rate:    Intake/output summary:   Intake/Output Summary (Last 24 hours) at 12/14/2017 1209 Last data filed at 12/14/2017 0631 Gross per 24 hour  Intake 2292.5 ml  Output 1050 ml  Net 1242.5 ml   LBM: Last BM Date: 12/12/17 Baseline Weight: Weight: 60.2 kg Most recent weight: Weight: 60.2 kg       Palliative Assessment/Data:    Flowsheet  Rows     Most Recent Value  Intake Tab  Unit at Time of Referral  ER  Palliative Care Primary Diagnosis  Cancer  Date Notified  12/02/17  Palliative Care Type  New Palliative care  Reason for referral  Clarify Goals of Care  Date of Admission  11/30/2017  Date first seen by Palliative Care  12/03/17  # of days Palliative referral response time  1 Day(s)  # of days IP prior to Palliative referral  1  Clinical Assessment  Psychosocial & Spiritual Assessment  Palliative Care Outcomes      Patient Active Problem List   Diagnosis Date Noted  . End of life care   . Increased oropharyngeal secretions   . Generalized pain   . Medication course changed   . DNR (do not resuscitate)   . Hypernatremia   . Status epilepticus ([place])   . Palliative care by specialist   . Goals of care, counseling/discussion   . Altered mental status   . Weakness 11/28/2017  . Abnormal LFTs 11/24/2017  . Hepatocellular carcinoma ([place]) 06/11/2017  . Chronic diastolic CHF (congestive heart failure) ([place]) 10/10/2014  . Palpitations 10/10/2014  . GERD (gastroesophageal reflux disease) 12/30/2013  . Normal coronary arteries 11/09/13 11/10/2013  . Fall 12/12/2011  . SCHIZOAFFECTIVE DISORDER 11/28/2009  . ANEMIA, NORMOCYTIC, CHRONIC 11/07/2008  . GOUT 05/25/2008  . CHRONIC KIDNEY DISEASE STAGE III (MODERATE) 08/27/2007  . HYPERCHOLESTEROLEMIA 06/12/2006  . BIPOLAR DISORDER 06/12/2006  . Essential hypertension 06/12/2006  . Seizures ([place]) 06/12/2006    Palliative Care Assessment & Plan   Patient  Profile: 77 year old female with epilepsy who presented with 3 seizuresafter being tapered offvalproic acid due to recent liver cancer.She was started Keppra in-house and initial EEG was normal but then developed partial complex status on 8/22 with continuous right-sided twitching and left hemispheric EEG correlation. MRI brain finding showed subtle gyriform diffusion abnormality involving the parasagittal left parietal occipital region and possibly left thalamus - likely from status epilepticus.  Has continued to have intermittent seizures,waxing and waning epileptiform discharges from the left hemispherewithout returnto baseline despite multiple AEDs. Consult ordered for goals of care. Family has now elected comfort care. Pt is transitioning towards EOL     Recommendations/Plan:  Continue morphine on an as-needed basis.  Monitor for need for scheduled dosing  Continue Ativan 1 mg every 4 hours as needed for anxiety as well as adjuvant to seizure   Continue Vimpat and Depacon  Secretions improving, continue Robinul  Goals of Care and Additional Recommendations:  Limitations on Scope of Treatment: Full Comfort Care  Code Status:    Code Status Orders  (From admission, onward)         Start     Ordered   12/08/17 1329  Do not attempt resuscitation (DNR)  Continuous    Question Answer Comment  In the event of cardiac or respiratory ARREST Do not call a "code blue"   In the event of cardiac or respiratory ARREST Do not perform Intubation, CPR, defibrillation or ACLS   In the event of cardiac or respiratory ARREST Use medication by any route, position, wound care, and other measures to relive pain and suffering. May use oxygen, suction and manual treatment of airway obstruction as needed for comfort.      12/08/17 1328        Code Status History    Date Active Date Inactive Code Status Order ID Comments User Context   11/27/2017 2241 12/08/2017 1328 Full  Code  [id number]  Cleophas Dunker, DO ED   02/03/2015 1241 02/04/2015 2153 Full Code [id number]  Frazier Richards, MD Inpatient   11/09/2013 1958 11/10/2013 1632 Full Code [id number]  Leonie Man, MD Inpatient   11/07/2013 1125 11/09/2013 1958 Full Code [id number]  Orson Eva, MD Inpatient   01/25/2013 1746 01/28/2013 1153 Full Code [id number]  Mirna Mires, MD ED   01/12/2013 0402 01/18/2013 2022 Full Code [id number]  Leone Haven, MD Inpatient       Prognosis:   Hours - Days  Discharge Planning:  Anticipated Hospital Death   Thank you for allowing the Palliative Medicine Team to assist in the care of this patient.   Time In: 1145 Time Out: 1210 Total Time  n Prolonged Time Billed  no       Greater than 50%  of this time was spent counseling and coordinating care related to the above assessment and plan.  Dory Horn, NP  Please contact Palliative Medicine Team phone at [phone] for questions and concerns.

## 2017-12-14 DEATH — deceased

## 2017-12-15 NOTE — Progress Notes (Signed)
Patient ID: [name], female   DOB: 07-28-1940, 77 y.o.   MRN: [id number]   This NP visited patient at the bedside as a follow up for palliative medicine needs and emotional support.  Both daughters are at bedside.  Patient is unresponsive and appears comfortable.  Signs of seizure activity.  Focus remains comfort and dignity. -Continue morphine as needed for symptom management -Continue Ativan 1 mg every 4 hours and as needed as needed for symptom/seizure   Management - ContinueRobinul for terminal secretions  Discussion had regarding natural trajectory and expectations at the end of life. Questions and concerns addressed      Prognosis is likely hours to days.  Palliative medicine team will continue to support holistically  Total time spent on the unit was 25 minutes  Greater than 50% of the time was spent in counseling and coordination of care  Wadie Lessen NP  Palliative Medicine Team Team Phone # [phone] Pager [phone]

## 2017-12-15 NOTE — Social Work (Signed)
CSW following for disposition, available for support when medically appropriate.   [place], [place] Work [phone]

## 2017-12-15 NOTE — Progress Notes (Signed)
Family Medicine Teaching Service Daily Progress Note Intern Pager: [phone]  Patient name: [name] Medical record number: [id number] Date of birth: April 07, 1941 Age: 77 y.o. Gender: female  Primary Care Provider: Everrett Coombe, MD Consultants: neuro Code Status: DNR  Pt Overview and Major Events to Date:  8/30 transitioned into comfort care   Assessment and Plan: Avanelle Pixley Reavesis a 77 y.o.femalepresenting with AMS and being found down due to seizure at home. PMH is significant forHCC, Schizoaffective Disorder,HxSeizure, Chronic D CHF, HLD, HTN, CKD III, GERD.   Comfort care: Anticipated hospital death.  -Cont ativan 1mg  q 4 PRN for seizures as below  -Cont morphine as needed  -Cont Robinul for secretions   Seizures: Transitioning to comfort care on 8/30.  Tapering off anticonvulsants one by one.  -Neurology following; appreciate recs for tapering anticonvulsants -Continue Ativan scheduled and as needed, valproate, Vimpat, Keppra (topomax already d/c'd)  - Daily vitals  Electrolyte depletion-  no longer monitoring on comfort care  AMS: Sleeping comfortably this am.  -  Comfort measures  Schizoeffective/Bipolar disorder  - Comfort measures only  Leukocytosis, resolved d/c monitoring  Known Hepatocellular Carcinoma: Confirmed by Biopsy 2018  Patient has expressed not wanting treatment to PCP - patient made DNI/DNR during palliative meeting on 8/26  HTN BP ranging 140-160s  -d/ced PRN hydral  HLD comfort measures only, no meds  CKD III  no longer monitoring  GERD  Protonix at home. - treat as needed  Anemia  No longer monitoring  FEN/GI:NPO Prophylaxis:None   Disposition: Comfort care, continue weaning anti-seizure medications per neuro   Subjective:  Comfortably sleeping this morning, no family at bedside.   Objective: Temp:  [98.5 F (36.9 C)] 98.5 F (36.9 C) (09/02 0532) Pulse Rate:  [100] 100 (09/02 0532) Resp:  [18] 18  (09/02 0532) BP: (149)/(75) 149/75 (09/02 0532) SpO2:  [99 %] 99 % (09/02 0532) Physical Exam: General: sleeping comfortably, NAD HEENT: NCAT, MM dry, oropharynx nonerythematous  Cardiac: RRR no m/g/r Lungs: Clear anteriorly, no increased WOB  Abdomen: soft, non-distended, normoactive BS Ext: Warm, dry, 2+ distal pulses, 1+ bilateral pitting LE edema   Laboratory: Recent Labs  Lab 12/08/17 0831 12/11/17 0244 12/12/17 0349  WBC 8.6 7.5 8.2  HGB 10.8* 10.2* 9.2*  HCT 34.3* 32.5* 28.5*  PLT 244 261 300   Recent Labs  Lab 12/08/17 0831 12/10/17 0425 12/11/17 0244 12/12/17 0349  NA 146* 143 142 142  K 3.9 3.4* 4.0 3.5  CL 112* 112* 113* 113*  CO2 24 23 22 23   BUN 19 13 14 18   CREATININE 1.05* 0.91 1.02* 1.02*  CALCIUM 9.5 8.9 8.9 8.8*  PROT 5.9*  --  5.5* 5.1*  BILITOT 0.8  --  0.5 0.4  ALKPHOS 159*  --  196* 180*  ALT 196*  --  223* 225*  AST 135*  --  136* 159*  GLUCOSE 138* 139* 179* 134*     Imaging/Diagnostic Tests: No results found.  Patriciaann Clan, DO 12/15/2017, 6:54 AM PGY-1, [place] Intern pager: [phone], text pages welcome

## 2017-12-15 NOTE — Plan of Care (Signed)
  Problem: Pain Managment: Goal: General experience of comfort will improve Outcome: Progressing   

## 2017-12-16 NOTE — Progress Notes (Signed)
Family Medicine Teaching Service Daily Progress Note Intern Pager: [phone]  Patient name: [name] Medical record number: [id number] Date of birth: 19-Oct-1940 Age: 77 y.o. Gender: female  Primary Care Provider: Everrett Coombe, MD Consultants: Palliative, SW, Nutrition Code Status: DNR  Pt Overview and Major Events to Date:  Hospital Day 15 Admitted: 12/05/2017 8/30 transitioned into comfort care   Assessment and Plan: [name] Reilly Reavesis a 77 y.o.femalepresenting with AMS and being found down due to seizure at home. PMH is significant forHCC, Schizoaffective Disorder,HxSeizure, Chronic D CHF, HLD, HTN, CKD III, GERD.   Seizures: Transitioning to comfort care on 8/30.   -Will not taper anti-convulsants in the setting of comfort care. -Continue Ativan scheduled and as needed, valproate, Vimpat, Keppra (topomax already d/c'd)  - No overt seizure activity   Comfort care: Anticipated hospital death -Cont ativan 1mg  q 4 PRN for seizures as below  -Cont morphine as needed  -Cont Robinul for secretions   Electrolyte depletion no longer monitoring on comfort care  AMS: Sleeping comfortably this am, not arousable - Comfort measures  Schizoeffective/Bipolar disorder - Comfort measures only  Leukocytosis, resolvedd/c monitoring  Known Hepatocellular Carcinoma: Confirmed by Biopsy 2018 Patient has expressed not wanting treatment to PCP - patient made DNI/DNR during palliative meeting on 8/26  HTN BP ranging 140-160s  -d/ced PRN hydral  HLD comfort measures only, no meds  CKD III no longer monitoring  GERD Protonix at home. - treat as needed  Anemia No longer monitoring  FEN/GI: NPO Prophylaxis:None   Disposition: Comfort care, continue weaning anti-seizure medications per neuro  Subjective:  Pt is sleeping comfortable in bed. Not arousable.   Objective: Temp:  [98.9 F (37.2 C)] 98.9 F (37.2 C) (09/03 0512) Pulse Rate:  [105] 105  (09/03 0512) Resp:  [20] 20 (09/03 0512) BP: (170)/(84) 170/84 (09/03 0512) SpO2:  [99 %] 99 % (09/03 0512) Intake/Output 09/02 0701 - 09/03 0700 In: 510.8 [IV Piggyback:510.8] Out: 250 [Urine:250] Physical Exam:  Gen: NAD, sleeping comfortably, cachectic.   HEENT: Temporal wasting, atraumatic. Clear conjuctiva, no scleral icterus and injection.  CV: Regular rate and rhythm.  Radial pulses 2+ bilaterally. No bilateral lower extremity edema. Resp: Clear to auscultation bilaterally.  No wheezing, rales, abnormal lung sounds.  No increased work of breathing appreciated. Abd: Nontender and nondistended on palpation to all 4 quadrants.  Positive bowel sounds.  Laboratory: Recent Labs  Lab 12/11/17 0244 12/12/17 0349  WBC 7.5 8.2  HGB 10.2* 9.2*  HCT 32.5* 28.5*  PLT 261 300   Recent Labs  Lab 12/10/17 0425 12/11/17 0244 12/12/17 0349  NA 143 142 142  K 3.4* 4.0 3.5  CL 112* 113* 113*  CO2 23 22 23   BUN 13 14 18   CREATININE 0.91 1.02* 1.02*  CALCIUM 8.9 8.9 8.8*  PROT  --  5.5* 5.1*  BILITOT  --  0.5 0.4  ALKPHOS  --  196* 180*  ALT  --  223* 225*  AST  --  136* 159*  GLUCOSE 139* 179* 134*   Imaging/Diagnostic Tests: No results found.  Wilber Oliphant, MD 12/16/2017, 6:26 AM PGY-1, [place] Intern pager: [phone], text pages welcome

## 2017-12-16 NOTE — Progress Notes (Signed)
Nutrition Brief Note  Chart reviewed. Pt now transitioning to comfort care.  No further nutrition interventions warranted at this time.  Please re-consult as needed.   Erlinda Solinger A. [place], RD, LDN, CDE Pager: [phone] After hours Pager: [phone]  

## 2017-12-16 NOTE — Plan of Care (Signed)
  Problem: Pain Managment: Goal: General experience of comfort will improve Outcome: Progressing   

## 2017-12-17 MED ORDER — SODIUM CHLORIDE 0.9 % IV SOLN: INTRAVENOUS | Status: DC | PRN

## 2017-12-17 NOTE — Care Management Note (Signed)
Case Management Note  Patient Details  Name: [name] MRN: [id number] Date of Birth: [birth year]  Subjective/Objective:                 Admitted 16 days ago w weakness. Now followed by palliative. Per notes today anticipate hours to days, is comfort care.    Action/Plan:  Patient followed by CM and CSW. At this time patient continues IV antiepileptics and cannot transfer to residential hospice with these meds in place. CSW following for medical stability to transfer, however hospital death is anticipated.  Expected Discharge Date:  12/15/17               Expected Discharge Plan:     In-House Referral:  Clinical Social Work  Discharge planning Services     Post Acute Care Choice:    Choice offered to:     DME Arranged:    DME Agency:     HH Arranged:    [place] Agency:     Status of Service:  In process, will continue to follow  If discussed at Long Length of Stay Meetings, dates discussed:    Additional Comments:  Carles Collet, RN 12/17/2017, 10:44 AM

## 2017-12-17 NOTE — Progress Notes (Addendum)
Family Medicine Teaching Service Daily Progress Note Intern Pager: [phone]  Patient name: [name] Medical record number: [id number] Date of birth: 05-30-1940 Age: 77 y.o. Gender: female  Primary Care Provider: Everrett Coombe, MD Consultants: Palliative, SW, Nutrition Code Status: DNR  Pt Overview and Major Events to Date:  Hospital Day 16 Admitted: 12/11/2017 8/30 transitioned into comfort care   Assessment and Plan: Suzzanne Brunkhorst Reavesis a 77 y.o.femalepresenting with AMS and being found down due to seizure at home. PMH is significant forHCC, Schizoaffective Disorder,HxSeizure, Chronic D CHF, HLD, HTN, CKD III, GERD.   Comfort care: Anticipated hospital death -Cont ativan 1mg  q 4 PRN for seizures -Cont morphine as needed  -Cont Robinul for secretions   Seizures:  -Will not taper anti-convulsants in the setting of comfort care. -Continue Ativan scheduled and as needed, valproate, Vimpat, Keppra  - No overt seizure activity  - Team member expressed concern for continued fluids and D5 potentially prolonging comfort care. Asked pharmacy for any other options for medication, but unsuccessful in solution. Team has decided that it would be in patient's best interest to be comfortable and decrease likelihood of seizure in this setting.     In the setting of comfort care, the patient's other chronic co-morbidities will not be monitored nor treated. Only in the case of discomfort will intervention be taken. This includes the following as previously charted: Electrolyte depletion, AMS, Schizoeffective/Bipolar disorder,Hepatocellular Carcinoma, HTN, HLD, CKDIII, GERD, anemia DVT prophylaxis.  FEN/GI: NPO  Disposition: Comfort care  Subjective:  Pt is sleeping comfortable in bed. Not arousable. No changes in subjective exam.  Objective: Temp:  [98.6 F (37 C)] 98.6 F (37 C) (09/04 0424) Pulse Rate:  [107] 107 (09/04 0424) Resp:  [21] 21 (09/04 0424) BP: (163)/(85) 163/85  (09/04 0424) SpO2:  [98 %] 98 % (09/04 0424) Intake/Output 09/03 0701 - 09/04 0700 In: 317 [IV Piggyback:317] Out: 750 [Urine:750] Physical Exam:  Gen: NAD, sleeping comfortably, cachectic.   HEENT: Temporal wasting, atraumatic. Clear conjuctiva, no scleral icterus and injection.  CV: Regular rate and rhythm.  Radial pulses 2+ bilaterally. 2+ pitting bilateral lower extremity edema. Resp: Clear to auscultation bilaterally.  No wheezing, rales, abnormal lung sounds.  No increased work of breathing appreciated. Abd: Nontender and nondistended on palpation to all 4 quadrants.  Positive bowel sounds.  Laboratory: No new results  Imaging/Diagnostic Tests: No results found.  Wilber Oliphant, MD 12/17/2017, 8:27 AM PGY-1, [place] Intern pager: [phone], text pages welcome  I have interviewed and examined the patient.  I have discussed the case and verified the key findings with Dr. Maudie Mercury.   I agree with their assessments and plans as documented in their note for today.  Lissa Morales, MD

## 2017-12-17 NOTE — Progress Notes (Signed)
Patient ID: [name], female   DOB: 25-Aug-1940, 77 y.o.   MRN: [id number]   This NP visited patient at the bedside as a follow up for palliative medicine needs and emotional support.  Both daughters are at bedside.  Patient is unresponsive and appears comfortable.  No signs of seizure activity.  Focus remains comfort and dignity. -Continue morphine as needed for symptom management -Continue Ativan 1 mg every 4 hours and as needed as needed for symptom/seizure   Management - ContinueRobinul for terminal secretions  Discussion had regarding natural trajectory and expectations at the end of life. Questions and concerns addressed        Patient's color is changed today, she appears pale.  Feet are cool to touch  Prognosis is likely hours to days.  Family is requesting that patient be XXX for privacy, nursing to put in place.   Palliative medicine team will continue to support holistically  Total time spent on the unit was 20 minutes  Greater than 50% of the time was spent in counseling and coordination of care  Wadie Lessen NP  Palliative Medicine Team Team Phone # [phone] Pager [phone]

## 2017-12-17 NOTE — Progress Notes (Signed)
Family at bedside and requested to speak to family medicine Dr on call. I passed the message along and shared the number provided to on call Dr.

## 2017-12-18 ENCOUNTER — Ambulatory Visit: Payer: Self-pay | Admitting: Student in an Organized Health Care Education/Training Program

## 2017-12-18 NOTE — Progress Notes (Signed)
Family Medicine Teaching Service Daily Progress Note Intern Pager: [phone]  Patient name: [name] Medical record number: [id number] Date of birth: [birth year] Age: 77 y.o. Gender: female  Primary Care Provider: Everrett Coombe, MD Consultants: Palliative, SW, Nutrition Code Status: DNR  Pt Overview and Major Events to Date:  Hospital Day 17 Admitted: 12/10/2017 8/30 transitioned into comfort care   Assessment and Plan: [name] Tessmer Reavesis a 77 y.o.femalepresenting with AMS and being found down due to seizure at home. PMH is significant forHCC, Schizoaffective Disorder,HxSeizure, Chronic D CHF, HLD, HTN, CKD III, GERD.   Seizures:  -Continue Ativan scheduled and as needed, valproate, Vimpat, Keppra  - No overt seizure activity   Comfort care: Anticipated hospital death -Cont ativan 1mg  q 4 PRN for seizures -Cont morphine as needed  -Cont Robinul for secretions   In the setting of comfort care, the patient's other chronic co-morbidities will not be monitored nor treated. Only in the case of discomfort will intervention be taken. This includes the following as previously charted: Electrolyte depletion, AMS, Schizoeffective/Bipolar disorder,Hepatocellular Carcinoma, HTN, HLD, CKDIII, GERD, anemia DVT prophylaxis.  FEN/GI: NPO  Disposition: Comfort care  Subjective:  Pt is sleeping comfortable in bed. Not arousable. No changes in subjective exam.  Objective: Temp:  [98.4 F (36.9 C)-98.9 F (37.2 C)] 98.4 F (36.9 C) (09/05 0501) Pulse Rate:  [97-105] 97 (09/05 0501) Resp:  [16-31] 16 (09/05 0501) BP: (149-172)/(79-95) 149/79 (09/05 0501) SpO2:  [97 %] 97 % (09/05 0501) Intake/Output 09/04 0701 - 09/05 0700 In: 2427.7 [IV Piggyback:2427.7] Out: 1250 [Urine:1250] Physical Exam:  Gen: NAD, sleeping on her side comfortably, cachectic.   HEENT: Temporal wasting, atraumatic. Clear conjuctiva, no scleral icterus and injection.  CV: Regular rate and rhythm.  Radial  pulses 2+ bilaterally. 2+ pitting bilateral lower extremity edema. Resp: Clear to auscultation bilaterally. No increased work of breathing appreciated. Abd: Nontender and nondistended on palpation to all 4 quadrants.   Laboratory: No new results  Imaging/Diagnostic Tests: No results found.  Wilber Oliphant, MD 12/18/2017, 8:19 AM PGY-1, [place] Intern pager: [phone], text pages welcome

## 2017-12-19 NOTE — Progress Notes (Signed)
I saw and examined this patient.  Discussed with full resident team in rounds.  Agreed on a plan with Dr. Maudie Mercury and team.  I will co-sign the resident note when available.  I was glad to have the opportunity to speak with Ms" Hallmon daughters today.  While I did not need convincing, they assured me that their mom would only want comfort care and they were determined to make sure she gets it.  They are pleased with her care, believe their mom is quite comforatable and see a noticeable decline with each passing day.  The plan remains for in house terminal [place].

## 2017-12-19 NOTE — Progress Notes (Signed)
Family Medicine Teaching Service Daily Progress Note Intern Pager: [phone]  Patient name: [name] Medical record number: [id number] Date of birth: 01-Aug-1940 Age: 77 y.o. Gender: female  Primary Care Provider: Everrett Coombe, MD Consultants: Palliative, SW, Nutrition Code Status: DNR  Pt Overview and Major Events to Date:  Hospital Day 18 Admitted: 12/03/2017 8/30 transitioned into comfort care   Assessment and Plan: [name] Heslin Reavesis a 77 y.o.who presented with AMS after seizure, found down at home for unknown period of time who has transitioned to comfort care.  She is currently still being treated for seizures.   Comfort care: Anticipated hospital death -Cont ativan 1mg  q 4 PRN for seizures -Cont morphine as needed  -Cont Robinul for secretions   Seizures:  -Continue Ativan scheduled and as needed, valproate, Vimpat, Keppra  - No overt seizure activity   FEN/GI: NPO  Disposition: Comfort care  Subjective:  Pt is sleeping comfortable in bed. No family at bedside.  Objective: Temp:  [97.7 F (36.5 C)] 97.7 F (36.5 C) (09/05 1758) Pulse Rate:  [107] 107 (09/05 1758) Resp:  [16] 16 (09/05 1758) BP: (140)/(72) 140/72 (09/05 1758) SpO2:  [96 %] 96 % (09/05 1758) Intake/Output 09/05 0701 - 09/06 0700 In: 400 [IV Piggyback:400] Out: 850 [Urine:850]   Physical Exam:  Gen: NAD, sleeping on her right side comfortably, cachectic.   HEENT: Temporal wasting CV: Regular rate and rhythm.  Radial pulses 2+ bilaterally. 2+ pitting bilateral lower extremity edema. Resp: Clear to auscultation bilaterally.   Wilber Oliphant, MD 12/19/2017, 6:17 AM PGY-1, [place] Intern pager: [phone], text pages welcome

## 2017-12-19 NOTE — Progress Notes (Addendum)
Knot on left lateral lower leg is noted.

## 2017-12-20 MED ORDER — KETOROLAC TROMETHAMINE 15 MG/ML IJ SOLN: 15.0000 mg | Freq: Four times a day (QID) | INTRAMUSCULAR | Status: DC | PRN

## 2017-12-20 NOTE — Progress Notes (Signed)
Family Medicine Teaching Service Daily Progress Note Intern Pager: [phone]  Patient name: [name] Medical record number: [id number] Date of birth: 1941/02/16 Age: 77 y.o. Gender: female  Primary Care Provider: Everrett Coombe, MD Consultants: palliative  Code Status: DNR, comfort care   Pt Overview and Major Events to Date:  77 year old woman with history of epilepsy, HFpEF, hepatocellular carcinoma who was found down for 24 hours after a seizure. She was admit with altered mental status and has been transitioned to comfort care with continued use of antiepileptics.    Assessment and Plan:  Anticipated hospital death  - continue ativan 1 mg q4 h scheduled  - continue morphine PRN   Hx of epilepsy  - continue home valproate, vimpat, keppra, and scheduled ativan  - no signs of seizure activity  Disposition: anticipated in hospital death   Subjective:  I spoke with [name] brother who says she appears comfortable and doesn't appear to be in pain. He did mention that he has noticed a pattern of shallow breathing. She does not appear to be in distress.   Objective: Temp:  [97.7 F (36.5 C)-98.9 F (37.2 C)] 98.9 F (37.2 C) (09/07 0529) Pulse Rate:  [104-122] 104 (09/07 0529) Resp:  [16-20] 16 (09/07 0529) BP: (140-158)/(83-93) 158/93 (09/07 0529) SpO2:  [95 %-96 %] 95 % (09/07 0529) Physical Exam: General: eyes closed, does not appear to be in distress, not grimacing, does not open eyes when her name is called   Cardiovascular: tachycardic, regular rate and rhythm Respiratory: non labored breathing, lungs clear to auscultation over anterior lung fields  Abdomen: soft, no grimace when palpated  Extremities: anasarca   Ledell Noss, MD 12/20/2017, 3:34 PM PGY-3, [place] internal Medicine FPTS Intern pager: [phone], text pages welcome

## 2017-12-20 NOTE — Progress Notes (Signed)
Palliative Medicine RN Note: Symptom check & follow up with family for support. Two daughters and brother at bedside.   No s/s distress at this time. Palms are red. BUE & face/head are hot to the touch. Feet are warm w pitting edema. Face and body are relaxed, no grimacing, no moaning; PAINAD 0. Respirations are very shallow, and pt did not respond to me or family during my visit.  I am concerned that she may become febrile. Due to liver ca, frequent stools, and inability to take PO, Romona Curls, PMT NP ordered IV toradol prn.   Per attending, plan is to stay here for in-hospital death. Pt remains on AE IV. Plan for continued PMT monitoring.   Marjie Skiff Filemon Breton, RN, BSN, [facility] Palliative Medicine Team 12/20/2017 11:30 AM Office [phone]

## 2017-12-21 DIAGNOSIS — R0682 Tachypnea, not elsewhere classified: Secondary | ICD-10-CM

## 2017-12-21 MED ORDER — LORAZEPAM 2 MG/ML IJ SOLN
2.0000 mg | INTRAMUSCULAR | Status: DC
  Administered 2017-12-21: 2 mg via INTRAVENOUS
  Filled 2017-12-21: qty 1

## 2017-12-21 MED ORDER — MORPHINE SULFATE (PF) 2 MG/ML IV SOLN
2.0000 mg | INTRAVENOUS | Status: DC | PRN
  Filled 2017-12-21: qty 2

## 2017-12-21 MED ORDER — MORPHINE SULFATE (PF) 2 MG/ML IV SOLN
2.0000 mg | INTRAVENOUS | Status: DC
  Administered 2017-12-21 (×2): 2 mg via INTRAVENOUS
  Filled 2017-12-21: qty 1

## 2017-12-25 ENCOUNTER — Ambulatory Visit: Payer: Self-pay | Admitting: Student in an Organized Health Care Education/Training Program

## 2018-01-13 NOTE — Clinical Social Work Note (Signed)
CSW signing off. Plan for hospital death.  Dayton Scrape, [place]

## 2018-01-13 NOTE — Progress Notes (Signed)
Daily Progress Note   Patient Name: [name]       Date: 2018-01-14 DOB: [birth year]  Age: 77 y.o. MRN#: [id number] Attending Physician: Lind Covert, MD Primary Care Physician: Everrett Coombe, MD Admit Date: 11/30/2017  Reason for Consultation/Follow-up: Terminal Care  Subjective: Patient seen, chart reviewed.  Patient is tachypnea this morning and feels tremulous all over.  Questionable seizures.  Respiratory rate 36 to 40/min.  She is unresponsive to voice and light touch Lower extremities are cool and knees are mottled.  Appears to be actively dying  Length of Stay: 20  Current Medications: Scheduled Meds:  . ammonium lactate   Topical BID  . glycopyrrolate  0.2 mg Intravenous Q8H  . lidocaine  15 mL Mouth/Throat Once  . LORazepam  2 mg Intravenous Q4H  . mouth rinse  15 mL Mouth Rinse BID  .  morphine injection  2 mg Intravenous Q4H    Continuous Infusions: . sodium chloride    . lacosamide (VIMPAT) IV 200 mg (14-Jan-2018 0953)  . levETIRAcetam Stopped (12/20/17 2338)  . valproate sodium 52.5 mL/hr at January 14, 2018 0600    PRN Meds: sodium chloride, glycopyrrolate, ketorolac, LORazepam, morphine injection, sodium chloride flush  Physical Exam  Constitutional:  Cachectic, unresponsive, elderly female.  She appears to be actively dying  HENT:  Temporal wasting  Pulmonary/Chest:  Increased work of breathing at rest.  Respiratory rate 40/min  Genitourinary:  Genitourinary Comments: Foley catheter  Neurological:  Patient feels tremulous all over and is tachypnea neck Questionable seizures  Skin:  Lower extremities are cool, knees are mottled  Psychiatric:  Patient is unresponsive  Nursing note and vitals reviewed.           Vital Signs: BP (!) 158/93 (BP  Location: Left Arm)   Pulse (!) 104   Temp 98.9 F (37.2 C) (Oral)   Resp 16   Ht 5\' 3"  (1.6 m)   Wt 60.2 kg   SpO2 95%   BMI 23.51 kg/m  SpO2: SpO2: 95 % O2 Device: O2 Device: Room Air O2 Flow Rate:    Intake/output summary:   Intake/Output Summary (Last 24 hours) at 01-14-2018 0955 Last data filed at 14-Jan-2018 0600 Gross per 24 hour  Intake 1043.42 ml  Output 1100 ml  Net -56.58 ml   LBM: Last BM  Date: 12/19/17 Baseline Weight: Weight: 60.2 kg Most recent weight: Weight: 60.2 kg       Palliative Assessment/Data:    Flowsheet Rows     Most Recent Value  Intake Tab  Unit at Time of Referral  ER  Palliative Care Primary Diagnosis  Cancer  Date Notified  12/02/17  Palliative Care Type  New Palliative care  Reason for referral  Clarify Goals of Care  Date of Admission  11/23/2017  Date first seen by Palliative Care  12/03/17  # of days Palliative referral response time  1 Day(s)  # of days IP prior to Palliative referral  1  Clinical Assessment  Psychosocial & Spiritual Assessment  Palliative Care Outcomes      Patient Active Problem List   Diagnosis Date Noted  . End of life care   . Increased oropharyngeal secretions   . Generalized pain   . Medication course changed   . DNR (do not resuscitate)   . Hypernatremia   . Status epilepticus ([place])   . Palliative care by specialist   . Goals of care, counseling/discussion   . Altered mental status   . Weakness 12/08/2017  . Abnormal LFTs 11/24/2017  . Hepatocellular carcinoma ([place]) 06/11/2017  . Chronic diastolic CHF (congestive heart failure) ([place]) 10/10/2014  . Palpitations 10/10/2014  . GERD (gastroesophageal reflux disease) 12/30/2013  . Normal coronary arteries 11/09/13 11/10/2013  . Fall 12/12/2011  . SCHIZOAFFECTIVE DISORDER 11/28/2009  . ANEMIA, NORMOCYTIC, CHRONIC 11/07/2008  . GOUT 05/25/2008  . CHRONIC KIDNEY DISEASE STAGE III (MODERATE) 08/27/2007  . HYPERCHOLESTEROLEMIA 06/12/2006  . BIPOLAR  DISORDER 06/12/2006  . Essential hypertension 06/12/2006  . Seizures ([place]) 06/12/2006    Palliative Care Assessment & Plan   Patient Profile: 77 year old female with epilepsy who presented with 3 seizuresafter being tapered offvalproic acid due to recent liver cancer.She was started Keppra in-house and initial EEG was normal but then developed partial complex status on 8/22 with continuous right-sided twitching and left hemispheric EEG correlation. MRI brain finding showed subtle gyriform diffusion abnormality involving the parasagittal left parietal occipital region and possibly left thalamus - likely from status epilepticus.  Has continued to have intermittent seizures,waxing and waning epileptiform discharges from the left hemispherewithout returnto baseline despite multiple AEDs. Consult ordered for goals of care. Family has now elected comfort care. Pt is actively dying   Recommendations/Plan:  Tachypnea: We will add scheduled morphine 2 mg every 4 hours and 2 to 4 mg every hour as needed for shortness of breath or pain.  Scheduled morphine is not beneficial and tachypnea persists, would recommend starting a morphine continuous infusion  :Secretions: Improved.  Continue scheduled Robinul  Patient is tremulous, questionable seizure activity, will increase scheduled Ativan to 2 mg every 4 hours and continue Vimpat and Depakene  Goals of Care and Additional Recommendations:  Limitations on Scope of Treatment: Full Comfort Care  Code Status:    Code Status Orders  (From admission, onward)         Start     Ordered   12/08/17 1329  Do not attempt resuscitation (DNR)  Continuous    Question Answer Comment  In the event of cardiac or respiratory ARREST Do not call a "code blue"   In the event of cardiac or respiratory ARREST Do not perform Intubation, CPR, defibrillation or ACLS   In the event of cardiac or respiratory ARREST Use medication by any route, position,  wound care, and other measures to relive pain and suffering.  May use oxygen, suction and manual treatment of airway obstruction as needed for comfort.      12/08/17 1328        Code Status History    Date Active Date Inactive Code Status Order ID Comments User Context   11/13/2017 2241 12/08/2017 1328 Full Code [id number]  Cleophas Dunker, DO ED   02/03/2015 1241 02/04/2015 2153 Full Code [id number]  Frazier Richards, MD Inpatient   11/09/2013 1958 11/10/2013 1632 Full Code [id number]  Leonie Man, MD Inpatient   11/07/2013 1125 11/09/2013 1958 Full Code [id number]  Orson Eva, MD Inpatient   01/25/2013 1746 01/28/2013 1153 Full Code [id number]  Mirna Mires, MD ED   01/12/2013 0402 01/18/2013 2022 Full Code [id number]  Leone Haven, MD Inpatient       Prognosis:   Hours - Days  Discharge Planning:  Anticipated Hospital Death    Thank you for allowing the Palliative Medicine Team to assist in the care of this patient.   Time In: 0930 Time Out: 0955 Total Time 25 min Prolonged Time Billed  no       Greater than 50%  of this time was spent counseling and coordinating care related to the above assessment and plan.  Dory Horn, NP  Please contact Palliative Medicine Team phone at [phone] for questions and concerns.

## 2018-01-13 NOTE — Progress Notes (Signed)
Patient noted with no respirations and no heart rate at 1305.  Verified by Hulan Amato, RN.

## 2018-01-13 NOTE — Death Summary Note (Signed)
[facility] Death Summary  Patient name: [name] Medical record number: [id number] Date of birth: 05-15-1940 Age: 77 y.o. Gender: female Date of Admission: Dec 15, 2017  Date of Death: 01-04-18 Admitting Physician: Lind Covert, MD  Primary Care Provider: Everrett Coombe, MD Consultants: Palliative, Neuro, Psychiatry  Indication for Hospitalization: Altered Mental Status  Discharge Diagnoses/Problem List:  Patient Active Problem List   Diagnosis Date Noted  . Tachypnea   . End of life care   . Increased oropharyngeal secretions   . Generalized pain   . Medication course changed   . DNR (do not resuscitate)   . Hypernatremia   . Status epilepticus ([place])   . Palliative care by specialist   . Goals of care, counseling/discussion   . Altered mental status   . Weakness 2017/12/15  . Abnormal LFTs 11/24/2017  . Hepatocellular carcinoma ([place]) 06/11/2017  . Chronic diastolic CHF (congestive heart failure) ([place]) 10/10/2014  . Palpitations 10/10/2014  . GERD (gastroesophageal reflux disease) 12/30/2013  . Normal coronary arteries 11/09/13 11/10/2013  . Fall 12/12/2011  . SCHIZOAFFECTIVE DISORDER 11/28/2009  . ANEMIA, NORMOCYTIC, CHRONIC 11/07/2008  . GOUT 05/25/2008  . CHRONIC KIDNEY DISEASE STAGE III (MODERATE) 08/27/2007  . HYPERCHOLESTEROLEMIA 06/26/2006  . BIPOLAR DISORDER 2006/06/26  . Essential hypertension 2006/06/26  . Seizures ([place]) 2006-06-26   Disposition: death  Brief Hospital Course:  [name] was a 77 year old female with known confirmed hepatocellular carcinoma who presented with altered mental status after being found down for an unknown period of time.  She lives at home alone with home health and family to check on her.  Upon admission, her chest x-ray was negative for any acute bony abnormalities, right tib-fib x-ray negative for acute bony abnormalities and pelvis x-ray negative for acute abnormality.  Humerus and elbow  x-ray were negative.  Per H&P, patient was not known to have seizure activity prior to this event.  At the beginning of her admission, she was noted to have clinical seizure activity and was given Ativan and phenytoin.  Palliative care was consulted for goals of care.  Neurology was consulted for seizure activity and multiple antiepileptics until patient was on 4 medications total, Topamax, Vimpat, Keppra, Dilantin.  Patient was ultimately transitioned to comfort care on December 12, 2017 at the request of her family.  During this time, patient was continued only on antiepileptic medications and noted to be comfortable with daily exams.  Patient was noted to have no respirations and no heart rate on 01-04-18 at 1305.  Significant Procedures: EEG  Significant Labs and Imaging:  Ct Angio Head W Or Wo Contrast Result Date: 12-15-17  IMPRESSION: No evidence of acute or significant vascular disease. Mild atherosclerosis of the aortic arch and both carotid bifurcations but no stenosis. 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 Asymmetry of the tongue. Question if this could relate to a previous resection of the left tongue base. If this is not the case, there could be swelling of the right side of the tongue. Electronically Signed   By: Nelson Chimes M.D.   On: 12-15-17 20:07   Dg Chest 2 View Result Date: 12/03/2017 CLINICAL DATA:  Altered mental status. EXAM: CHEST - 2 VIEW COMPARISON:  15-Dec-2017 FINDINGS: Lungs are adequately inflated and otherwise clear. Cardiomediastinal silhouette and remainder the exam is unchanged. IMPRESSION: No active cardiopulmonary disease. Electronically Signed   By:  Marin Olp M.D.   On: 12/03/2017 13:14   Dg Elbow Complete Right (3+view) Result Date: 12/02/2017 CLINICAL DATA:  Bruising of the right posterior humerus, the patient would not give appropriate history EXAM:  RIGHT ELBOW - COMPLETE 3+ VIEW COMPARISON:  None. FINDINGS: Alignment is normal. The elbow joint spaces appear normal. No fracture is seen. No joint effusion is noted. IMPRESSION: Negative. Electronically Signed   By: Ivar Drape M.D.   On: 12/02/2017 17:22   Dg Tibia/fibula Right Result Date: 11/23/2017 IMPRESSION: Mild soft tissue swelling without acute bony abnormality. Electronically Signed   By: Margarette Canada M.D.   On: 11/24/2017 17:13   Dg Abd 1 View Result Date: 12/09/2017 IMPRESSION: Nasoenteric feeding tube placed under fluoroscopic control with the tip at the ligament of Treitz. Electronically Signed   By: Ivar Drape M.D.   On: 12/09/2017 16:48   Ct Head Wo Contrast Result Date: 11/29/2017 IMPRESSION: No acute intracranial abnormalities. Chronic atrophy and small vessel ischemic changes. Electronically Signed   By: Lucienne Capers M.D.   On: 11/29/2017 04:50   Ct Angio Neck W And/or Wo Contrast Result Date: 11/26/2017  IMPRESSION: No evidence of acute or significant vascular disease. Mild atherosclerosis of the aortic arch and both carotid bifurcations but no stenosis. 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 Asymmetry of the tongue. Question if this could relate to a previous resection of the left tongue base. If this is not the case, there could be swelling of the right side of the tongue. Electronically Signed   By: Nelson Chimes M.D.   On: 11/30/2017 20:07   Mr Brain Wo Contrast Result Date: 12/02/2017 IMPRESSION: MRI HEAD IMPRESSION: 1. Question subtle gyriform diffusion abnormality involving the parasagittal left parietooccipital region and possibly left thalamus. Primary consideration would be subtle changes related to seizure. Correlation with history and EEG suggested. Sequelae of mild ischemia would be the primary differential consideration. 2. Moderate atrophy with mild chronic small  vessel ischemic disease. MRA HEAD IMPRESSION: Stable appearance of the intracranial circulation. No large vessel occlusion. No hemodynamically significant or correctable stenosis. Electronically Signed   By: Jeannine Boga M.D.   On: 12/02/2017 02:10   Dg Pelvis Portable Result Date: 11/24/2017 IMPRESSION: No acute abnormality. Electronically Signed   By: Margarette Canada M.D.   On: 11/30/2017 17:15   Ct C-spine No Charge Result Date: 11/17/2017 IMPRESSION: No acute or traumatic finding. Probable endplate Schmorl's nodes at C5, C6 and C7. Electronically Signed   By: Nelson Chimes M.D.   On: 12/10/2017 19:59   Dg Chest Portable 1 View Result Date: 11/21/2017 IMPRESSION: No active disease. Electronically Signed   By: Margarette Canada M.D.   On: 12/02/2017 17:17   Dg Humerus Right Result Date: 12/02/2017 IMPRESSION: No acute fracture.  Degenerative change of the right shoulder. Electronically Signed   By: Ivar Drape M.D.   On: 12/02/2017 17:21   Mr Jodene Nam Head Wo Contrast Result Date: 12/02/2017 IMPRESSION: MRI HEAD IMPRESSION: 1. Question subtle gyriform diffusion abnormality involving the parasagittal left parietooccipital region and possibly left thalamus. Primary consideration would be subtle changes related to seizure. Correlation with history and EEG suggested. Sequelae of mild ischemia would be the primary differential consideration. 2. Moderate atrophy with mild chronic small vessel ischemic disease. MRA HEAD IMPRESSION: Stable appearance of the intracranial circulation. No large vessel occlusion. No hemodynamically significant or correctable stenosis.  Electronically Signed   By: Jeannine Boga M.D.   On: 12/02/2017 02:10    Wilber Oliphant, MD 12/23/2017, 12:18 PM PGY-1, [place]

## 2018-01-13 NOTE — Progress Notes (Signed)
Family Medicine Teaching Service Daily Progress Note Intern Pager: [phone]  Patient name: [name] Medical record number: [id number] Date of birth: 1940/05/11 Age: 77 y.o. Gender: female  Primary Care Provider: Everrett Coombe, MD Consultants: palliative  Code Status: DNR, comfort care   Pt Overview and Major Events to Date:  77 year old woman with history of epilepsy, HFpEF, hepatocellular carcinoma who was admit for status. She continued to have seizures despite multiple AEDs without return to baseline and was transitioned to comfort after family discussed goals with palliative care.   Assessment and Plan:  Comfort care  Tachypnea  - palliative care started scheduled morphine today, if this does not provide relief of the tachypnea they recommend transition to continuous morphine infusion - continue morphine PRN   Hx of epilepsy  -  scheduled ativan dose increased today due to concern for seizure like activity  - continue home valproate, lacosamide, levetiracetam - no signs of seizure activity  Disposition: anticipated in hospital death possibly within the next few days   Subjective:  [name] is tachypnic and tachycardic, this morning palliative noticed that she was having some seizure like activity and medications were adjusted.    Objective:   Physical Exam: General: eyes closed, does not appear to be in distress, not grimacing, does not open eyes when her name is called   Cardiovascular: tachycardic, no murmur appreciated Respiratory: tachypneic with intercostal retractions, lungs clear to auscultation Abdomen: soft, no grimace when palpated  Extremities: anasarca   Ledell Noss, MD 2017-12-25, 10:07 AM PGY-3, [place] internal Medicine FPTS Intern pager: [phone], text pages welcome

## 2018-01-13 DEATH — deceased

## 2018-01-21 ENCOUNTER — Ambulatory Visit: Payer: Medicare Other | Admitting: Podiatry
